# Patient Record
Sex: Female | Born: 1935 | Race: Black or African American | Hispanic: No | Marital: Married | Smoking: Never smoker
Health system: Southern US, Community
[De-identification: ages and names within clinical notes are randomized; demographics above are authoritative.]

## PROBLEM LIST (undated history)

## (undated) DIAGNOSIS — M1990 Unspecified osteoarthritis, unspecified site: Secondary | ICD-10-CM

## (undated) DIAGNOSIS — E785 Hyperlipidemia, unspecified: Secondary | ICD-10-CM

## (undated) DIAGNOSIS — L03116 Cellulitis of left lower limb: Secondary | ICD-10-CM

## (undated) DIAGNOSIS — L97909 Non-pressure chronic ulcer of unspecified part of unspecified lower leg with unspecified severity: Secondary | ICD-10-CM

## (undated) DIAGNOSIS — N3281 Overactive bladder: Secondary | ICD-10-CM

## (undated) DIAGNOSIS — E669 Obesity, unspecified: Secondary | ICD-10-CM

## (undated) DIAGNOSIS — C801 Malignant (primary) neoplasm, unspecified: Secondary | ICD-10-CM

## (undated) DIAGNOSIS — I10 Essential (primary) hypertension: Secondary | ICD-10-CM

## (undated) HISTORY — DX: Obesity, unspecified: E66.9

## (undated) HISTORY — DX: Hyperlipidemia, unspecified: E78.5

## (undated) HISTORY — DX: Essential (primary) hypertension: I10

## (undated) HISTORY — PX: TUBAL LIGATION: SHX77

## (undated) HISTORY — PX: JOINT REPLACEMENT: SHX530

## (undated) HISTORY — PX: KNEE SURGERY: SHX244

## (undated) HISTORY — DX: Unspecified osteoarthritis, unspecified site: M19.90

## (undated) HISTORY — PX: ROTATOR CUFF REPAIR: SHX139

## (undated) HISTORY — PX: EYE SURGERY: SHX253

## (undated) HISTORY — DX: Overactive bladder: N32.81

## (undated) HISTORY — DX: Malignant (primary) neoplasm, unspecified: C80.1

---

## 1998-10-04 ENCOUNTER — Ambulatory Visit (HOSPITAL_COMMUNITY): Admission: RE | Admit: 1998-10-04 | Discharge: 1998-10-04 | Payer: Self-pay | Admitting: Obstetrics and Gynecology

## 1998-12-11 ENCOUNTER — Other Ambulatory Visit: Admission: RE | Admit: 1998-12-11 | Discharge: 1998-12-11 | Payer: Self-pay | Admitting: Obstetrics and Gynecology

## 1999-10-25 ENCOUNTER — Encounter: Payer: Self-pay | Admitting: Orthopedic Surgery

## 1999-10-25 ENCOUNTER — Ambulatory Visit (HOSPITAL_COMMUNITY): Admission: RE | Admit: 1999-10-25 | Discharge: 1999-10-25 | Payer: Self-pay | Admitting: Orthopedic Surgery

## 1999-12-04 ENCOUNTER — Encounter: Payer: Self-pay | Admitting: Orthopedic Surgery

## 1999-12-05 ENCOUNTER — Ambulatory Visit (HOSPITAL_COMMUNITY): Admission: RE | Admit: 1999-12-05 | Discharge: 1999-12-06 | Payer: Self-pay | Admitting: Orthopedic Surgery

## 2000-01-20 ENCOUNTER — Ambulatory Visit (HOSPITAL_COMMUNITY): Admission: RE | Admit: 2000-01-20 | Discharge: 2000-01-20 | Payer: Self-pay | Admitting: Emergency Medicine

## 2000-01-22 ENCOUNTER — Encounter: Admission: RE | Admit: 2000-01-22 | Discharge: 2000-02-24 | Payer: Self-pay | Admitting: Orthopedic Surgery

## 2000-10-20 ENCOUNTER — Ambulatory Visit (HOSPITAL_COMMUNITY): Admission: RE | Admit: 2000-10-20 | Discharge: 2000-10-20 | Payer: Self-pay | Admitting: Gastroenterology

## 2001-01-13 ENCOUNTER — Encounter: Payer: Self-pay | Admitting: Obstetrics and Gynecology

## 2001-01-13 ENCOUNTER — Ambulatory Visit (HOSPITAL_COMMUNITY): Admission: RE | Admit: 2001-01-13 | Discharge: 2001-01-13 | Payer: Self-pay | Admitting: Obstetrics and Gynecology

## 2001-05-13 ENCOUNTER — Other Ambulatory Visit: Admission: RE | Admit: 2001-05-13 | Discharge: 2001-05-13 | Payer: Self-pay | Admitting: Obstetrics and Gynecology

## 2002-10-04 ENCOUNTER — Encounter: Payer: Self-pay | Admitting: Obstetrics and Gynecology

## 2002-10-04 ENCOUNTER — Ambulatory Visit (HOSPITAL_COMMUNITY): Admission: RE | Admit: 2002-10-04 | Discharge: 2002-10-04 | Payer: Self-pay | Admitting: Obstetrics and Gynecology

## 2003-05-23 ENCOUNTER — Other Ambulatory Visit: Admission: RE | Admit: 2003-05-23 | Discharge: 2003-05-23 | Payer: Self-pay | Admitting: Obstetrics and Gynecology

## 2003-10-11 ENCOUNTER — Ambulatory Visit (HOSPITAL_COMMUNITY): Admission: RE | Admit: 2003-10-11 | Discharge: 2003-10-11 | Payer: Self-pay | Admitting: Obstetrics and Gynecology

## 2003-10-11 ENCOUNTER — Encounter: Payer: Self-pay | Admitting: Obstetrics and Gynecology

## 2004-01-11 ENCOUNTER — Encounter: Admission: RE | Admit: 2004-01-11 | Discharge: 2004-04-10 | Payer: Self-pay | Admitting: Family Medicine

## 2004-02-15 ENCOUNTER — Ambulatory Visit (HOSPITAL_COMMUNITY): Admission: RE | Admit: 2004-02-15 | Discharge: 2004-02-15 | Payer: Self-pay | Admitting: Ophthalmology

## 2004-07-31 ENCOUNTER — Other Ambulatory Visit: Admission: RE | Admit: 2004-07-31 | Discharge: 2004-07-31 | Payer: Self-pay | Admitting: Obstetrics and Gynecology

## 2004-10-21 ENCOUNTER — Ambulatory Visit (HOSPITAL_COMMUNITY): Admission: RE | Admit: 2004-10-21 | Discharge: 2004-10-21 | Payer: Self-pay | Admitting: Obstetrics and Gynecology

## 2005-07-31 ENCOUNTER — Other Ambulatory Visit: Admission: RE | Admit: 2005-07-31 | Discharge: 2005-07-31 | Payer: Self-pay | Admitting: Obstetrics and Gynecology

## 2005-10-23 ENCOUNTER — Ambulatory Visit (HOSPITAL_COMMUNITY): Admission: RE | Admit: 2005-10-23 | Discharge: 2005-10-23 | Payer: Self-pay | Admitting: Obstetrics and Gynecology

## 2006-09-29 ENCOUNTER — Encounter: Admission: RE | Admit: 2006-09-29 | Discharge: 2006-09-29 | Payer: Self-pay | Admitting: Orthopedic Surgery

## 2006-10-14 ENCOUNTER — Inpatient Hospital Stay (HOSPITAL_COMMUNITY): Admission: RE | Admit: 2006-10-14 | Discharge: 2006-10-19 | Payer: Self-pay | Admitting: Orthopedic Surgery

## 2006-10-15 ENCOUNTER — Ambulatory Visit: Payer: Self-pay | Admitting: Physical Medicine & Rehabilitation

## 2006-11-18 ENCOUNTER — Encounter: Admission: RE | Admit: 2006-11-18 | Discharge: 2007-01-27 | Payer: Self-pay | Admitting: Orthopedic Surgery

## 2007-07-22 ENCOUNTER — Ambulatory Visit (HOSPITAL_COMMUNITY): Admission: RE | Admit: 2007-07-22 | Discharge: 2007-07-22 | Payer: Self-pay | Admitting: Family Medicine

## 2008-02-16 ENCOUNTER — Encounter (INDEPENDENT_AMBULATORY_CARE_PROVIDER_SITE_OTHER): Payer: Self-pay | Admitting: Obstetrics and Gynecology

## 2008-02-16 ENCOUNTER — Ambulatory Visit (HOSPITAL_COMMUNITY): Admission: RE | Admit: 2008-02-16 | Discharge: 2008-02-16 | Payer: Self-pay | Admitting: Obstetrics and Gynecology

## 2008-02-27 HISTORY — PX: ABDOMINAL HYSTERECTOMY: SHX81

## 2008-03-15 ENCOUNTER — Ambulatory Visit: Admission: RE | Admit: 2008-03-15 | Discharge: 2008-03-15 | Payer: Self-pay | Admitting: Gynecologic Oncology

## 2008-04-19 ENCOUNTER — Ambulatory Visit: Admission: RE | Admit: 2008-04-19 | Discharge: 2008-04-19 | Payer: Self-pay | Admitting: Gynecologic Oncology

## 2008-06-21 ENCOUNTER — Emergency Department (HOSPITAL_COMMUNITY): Admission: EM | Admit: 2008-06-21 | Discharge: 2008-06-21 | Payer: Self-pay | Admitting: Emergency Medicine

## 2008-06-23 ENCOUNTER — Encounter: Admission: RE | Admit: 2008-06-23 | Discharge: 2008-06-23 | Payer: Self-pay | Admitting: Family Medicine

## 2008-08-01 ENCOUNTER — Encounter: Payer: Self-pay | Admitting: Gynecologic Oncology

## 2008-08-01 ENCOUNTER — Other Ambulatory Visit: Admission: RE | Admit: 2008-08-01 | Discharge: 2008-08-01 | Payer: Self-pay | Admitting: Gynecologic Oncology

## 2008-08-01 ENCOUNTER — Ambulatory Visit: Admission: RE | Admit: 2008-08-01 | Discharge: 2008-08-01 | Payer: Self-pay | Admitting: Gynecologic Oncology

## 2008-08-09 ENCOUNTER — Ambulatory Visit: Payer: Self-pay | Admitting: Vascular Surgery

## 2008-09-29 ENCOUNTER — Ambulatory Visit (HOSPITAL_COMMUNITY): Admission: RE | Admit: 2008-09-29 | Discharge: 2008-09-29 | Payer: Self-pay | Admitting: Family Medicine

## 2009-04-02 ENCOUNTER — Encounter: Admission: RE | Admit: 2009-04-02 | Discharge: 2009-05-01 | Payer: Self-pay | Admitting: Orthopedic Surgery

## 2009-04-25 ENCOUNTER — Encounter: Payer: Self-pay | Admitting: Gynecologic Oncology

## 2009-04-25 ENCOUNTER — Ambulatory Visit: Admission: RE | Admit: 2009-04-25 | Discharge: 2009-04-25 | Payer: Self-pay | Admitting: Gynecologic Oncology

## 2009-04-25 ENCOUNTER — Other Ambulatory Visit: Admission: RE | Admit: 2009-04-25 | Discharge: 2009-04-25 | Payer: Self-pay | Admitting: Gynecologic Oncology

## 2009-12-26 ENCOUNTER — Ambulatory Visit: Admission: RE | Admit: 2009-12-26 | Discharge: 2009-12-26 | Payer: Self-pay | Admitting: Gynecologic Oncology

## 2009-12-26 ENCOUNTER — Other Ambulatory Visit: Admission: RE | Admit: 2009-12-26 | Discharge: 2009-12-26 | Payer: Self-pay | Admitting: Gynecologic Oncology

## 2010-02-08 ENCOUNTER — Ambulatory Visit (HOSPITAL_COMMUNITY): Admission: RE | Admit: 2010-02-08 | Discharge: 2010-02-08 | Payer: Self-pay | Admitting: Family Medicine

## 2010-04-27 ENCOUNTER — Emergency Department (HOSPITAL_COMMUNITY): Admission: EM | Admit: 2010-04-27 | Discharge: 2010-04-27 | Payer: Self-pay | Admitting: Family Medicine

## 2010-12-26 ENCOUNTER — Ambulatory Visit
Admission: RE | Admit: 2010-12-26 | Discharge: 2010-12-26 | Payer: Self-pay | Source: Home / Self Care | Attending: Gynecologic Oncology | Admitting: Gynecologic Oncology

## 2011-03-07 ENCOUNTER — Other Ambulatory Visit (HOSPITAL_COMMUNITY): Payer: Self-pay | Admitting: Family Medicine

## 2011-03-07 DIAGNOSIS — Z1231 Encounter for screening mammogram for malignant neoplasm of breast: Secondary | ICD-10-CM

## 2011-03-20 ENCOUNTER — Ambulatory Visit (HOSPITAL_COMMUNITY)
Admission: RE | Admit: 2011-03-20 | Discharge: 2011-03-20 | Disposition: A | Discharge status: Home / Self Care | Payer: Medicare Other | Source: Ambulatory Visit | Attending: Family Medicine | Admitting: Family Medicine

## 2011-03-20 DIAGNOSIS — Z1231 Encounter for screening mammogram for malignant neoplasm of breast: Secondary | ICD-10-CM

## 2011-05-13 NOTE — Consult Note (Signed)
NAME:  Jenna [name], Jenna [name] NO.:  [id number]   MEDICAL RECORD NO.:  [id number]          PATIENT TYPE:  OUT   LOCATION:  GYN                          FACILITY:  [facility]   PHYSICIAN:  Paola A. Duard Brady, MD    DATE OF BIRTH:  01/27/1931   DATE OF CONSULTATION:  08/01/2008  DATE OF DISCHARGE:                                 CONSULTATION   The patient is a 77 year old with a stage IB grade 1 endometrioid  adenocarcinoma.  She underwent robotic hysterectomy, BSO, bilateral  pelvic lymph node dissection at [facility] March 24, 2008.  There was only 16%  myometrial invasion, no lymphovascular space involvement, negative  washings, negative tubes and ovaries, and 0/17 lymph nodes were  involved.  She was dispositioned to close followup.  She was seen for  postoperative check April 19, 2008, and comes in today for her first  interval exam.  She does have some what she described as some occasional  naggy pains which she has noticed.  Nothing appears to exacerbate them,  potentially movement.  They only last for a few seconds, and they occur  most often in the left upper quadrant.  She states she has spoken with  her son-in-law who is an OB/GYN and states that these are normal pains,  and that she believes that they most likely are   REVIEW OF SYSTEMS:  She denies any chest pain, short of breath, nausea,  vomiting, fevers, chills, vaginal bleeding, change in had bowel or  bladder habits, unintentional weight loss, or weight gain.  There have  been no interval medical changes.   INTERVAL MEDICAL HISTORY:  She has been diagnosed with a brain aneurysm  after suffering from a fairly acute headache.  It is being monitored at  this time, with no acute intervention required.   PHYSICAL EXAMINATION:  VITAL SIGNS:  Weight 246 pounds, which is stable.  Blood pressure 128/69.  GENERAL:  Well-nourished, well-developed female, in no acute distress.  NECK:  Supple.  There is no lymphadenopathy, no  thyromegaly.  LUNGS:  Clear to auscultation bilaterally.  CARDIOVASCULAR:  Regular rate and rhythm.  ABDOMEN:  Morbidly obese.  She has well-healed surgical incisions.  She  has about a 0.5 seborrheic keratosis on the left upper abdomen.  Groins  are negative for adenopathy, but exam is somewhat limited by habitus.  EXTREMITIES:  She has well-healed surgical incisions on her left knee.  She has 1-2+ pitting edema, equal bilaterally.  PELVIC:  External genitalia is within normal limits, though atrophic.  The vagina is atrophic.  The vaginal cuff is visualized.  There are no  gross visible lesions.  ThinPrep Pap was submitted without difficulty.  Bimanual examination again is somewhat limited by habitus.  There are no  palpable masses or nodularity.  Rectal confirms no masses.  There is a  large amount of stool in the vault.   ASSESSMENT:  A 77 year old with stage I B grade 1 endometrioid  adenocarcinoma who is doing well and has no evidence of recurrent  disease.   PLAN:  Will follow the results for Pap  smear from today.  She will  follow up with Dr. Estanislado Pandy in 4 months and return to see [place] in 8 months.  Will continue alternating every 4 months for the first year, and then  she will go to q.6 month visits.  She was given a reminder card to  contact us after she sees Dr. Estanislado Pandy for an appointment in April 2010.  She will follow up with her other physicians as scheduled.      Paola A. Duard Brady, MD  Electronically Signed     PAG/MEDQ  D:  08/01/2008  T:  08/01/2008  Job:  [id number]   cc:   Dois Davenport A. Rivard, M.D.  Fax: [phone]   Telford Nab, R.N.  [address]  [place], [place] [address]   Jethro Bastos, M.D.  Fax: [phone]

## 2011-05-13 NOTE — Op Note (Signed)
NAME:  Jenna [name], SCHMID NO.:  [id number]   MEDICAL RECORD NO.:  [id number]          PATIENT TYPE:  AMB   LOCATION:  SDC                           FACILITY:  [facility]   PHYSICIAN:  Dois Davenport A. Rivard, M.D. DATE OF BIRTH:  07-12-1931   DATE OF PROCEDURE:  02/16/2008  DATE OF DISCHARGE:                               OPERATIVE REPORT   PREOPERATIVE DIAGNOSIS:  Post menopausal bleeding and endometrial polyp.   POSTOPERATIVE DIAGNOSIS:  Post menopausal bleeding and endometrial  polyp.   ANESTHESIA:  General.   PROCEDURE:  Hysteroscopy, resection of endometrial polyp, and fractional  dilatation and curettage.   SURGEON:  Crist Fat. Rivard, M.D.   ASSISTANT:  None.   ESTIMATED BLOOD LOSS:  Minimal.   PROCEDURE IN DETAIL:  After being informed of the planned procedure with  possible complications including bleeding, infection, injury to uterus  and intra-abdominal organs, informed consent was obtained and the  patient was taken to OR #4, given general anesthesia with laryngeal  mask, and placed in the lithotomy position.  Please note that in the  preoperative preparation, the patient was started on an Ancef 2 gram  infusion and rapidly after starting the infusion she started feeling  sick, vomited, and even had some respiratory issues with wheezing. The  patient responded to cortisone and broncodilator.  She is prepped and  draped in a sterile fashion.  Her bladder is emptied with an in-and-out  red rubber catheter.  Pelvic exam reveals an anteverted uterus, small,  and adnexa are not felt.   A weighted speculum is inserted in the vagina and the cervix was grasped  with a tenaculum forceps.  We proceed with a paracervical block using  Nesacaine 1%, 16 mL in the usual fashion.  ECC was performed easily and  the uterus was sounded at 8.5 cm.  The cervix is easily dilated using  Hegar dilator until #31 which allows easy entry of a diagnostic scope  with a perfusion of  sorbitol at 100 mmHg maximum pressure.  We  visualized the entire uterine cavity which reveals, around the left  tubal ostia, multiple small polypoid formation with evident atypical  blood vessels suspicious for either hyperplassia or adenocarcinoma.  Otherwise, the endometrium appears atrophic.  The diagnostic  hysteroscope is removed for the operative hysteroscope and using a  single loop resectoscope, we resect this area and the left cornual area.  This was sent separately and is labeled endometrial polyp.  After  resection is completed, the hysteroscope is removed and a sharp curette  is used to do a general, endometrial curettage.   The instruments are then removed.  Instruments and sponge count is  complete x2.  Estimated blood loss is minimal.  Water deficit is 55 mL.  Specimens are endocervical curettage, endometrial curettings and  endometrial polyps were sent separately to pathology.  The procedure is  very well tolerated by the patient who is taken to recovery room in a  well and stable condition.      Crist Fat Rivard, M.D.  Electronically Signed     SAR/MEDQ  D:  02/16/2008  T:  02/16/2008  Job:  [id number]

## 2011-05-13 NOTE — Consult Note (Signed)
NAME:  Jenna [name], Jenna [name] NO.:  [id number]   MEDICAL RECORD NO.:  [id number]          PATIENT TYPE:  OUT   LOCATION:  GYN                          FACILITY:  [facility]   PHYSICIAN:  Paola A. Duard Brady, MD    DATE OF BIRTH:  20-Oct-1931   DATE OF CONSULTATION:  DATE OF DISCHARGE:                                 CONSULTATION   The patient is seen today in consultation at the request of Dr. Estanislado Pandy.  Ms. Mazo is a 77 year old gravida 7, para 7,-0-0-6 who went in for  her annual visit.  At that time, she complained to Dr. Estanislado Pandy of  bleeding for about 1 to 2 weeks.  She had never had any postmenopausal  bleeding up to this point.  She was menopausal starting in her late 35s  or 70s, was on hormone replacement therapy for about a 2-3 years after  menopause, but subsequently stopped it.  As stated above, she had not  had any bleeding should until prior to her annual examination.  With the  bleeding, there was no associated pain or discomfort, or abdominal  cramping.   REVIEW OF SYSTEMS:  She denies any chest pain, short of breath, nausea,  vomiting, fevers, chills, unintentional weight loss, weight gain,  headaches, visual changes.  10-point review of systems is otherwise  negative in addition.  She did have an endometrial biopsy performed by  Dr. Estanislado Pandy that revealed complex hyperplasia with atypia, could not rule  out a grade 1 endometrial carcinoma.  Ultrasound performed on January 22  revealed a 7 x 4 x 3 cm uterus.  The ovaries bilaterally looked  unremarkable.  She did have a 0.9 cm hyperechoic mass.   PAST MEDICAL HISTORY:  Hypertension and diabetes.   MEDICATIONS:  1. Glipizide 2.5 mg p.o. daily.  2. Metformin ER 500 mg #4 at bedtime.  3. Lodine XL 1 p.o. twice daily.  4. Benicar hydrochlorothiazide 40/25 mg 1 p.o. daily.  5. Norvasc 10 mg p.o. daily.  6. Lipitor 20 mg p.o. daily.  7. Baby aspirin daily.  8. Stool softener daily.   PAST SURGICAL HISTORY:  Left  knee replacement in October 2007, bilateral  tubal ligation, rotator cuff surgery on the right, she had NSVD x 7.   ALLERGIES:  None.  She states she became nauseated with ANCEF.   SOCIAL HISTORY:  She denies use tobacco or alcohol.  She is retired and  married.   FAMILY HISTORY:  Her sister had a oral cancer.  She smoked tobacco and  drank alcohol.  She had a brother with colon cancer.  She has another  brother with questionable lung cancer.  She has one 67 year old daughter  with a positive APC mutation who is scheduled for total colectomy at  [facility] on April 9.   HEALTH MAINTENANCE:  She is up-to-date on her mammograms having had one  in August 2008.  She had a colonoscopy 9 years ago.  She states she is  due.   PHYSICAL EXAMINATION:  Weight 244 pounds, height 5 feet 1 inches.  Blood  pressure 152/72, pulse  74.  Well-nourished, well-developed female in no acute distress.  NECK:  Supple with no lymphadenopathy, no thyromegaly.  Lungs clear to auscultation bilaterally.  CARDIOVASCULAR:  Regular rate and rhythm.  ABDOMEN:  Morbidly obese.  She has a well-healed infraumbilical vertical  skin incision measuring approximately 4 cm.  Abdomen is soft, nontender,  nondistended.  No palpable masses or cardiomegaly though exam is limited  by habitus.  Groins are negative for adenopathy.  EXTREMITIES:  She has 2+ lower extremity pitting edema.  Left greater  than right with some chronic venous stasis changes.  She has a well-  healed vertical skin incision overlying her left knee.  PELVIC:  External genitalia is within normal limits.  The vagina is  somewhat atrophic.  The cervix is visualized.  It is clean.  There is no  visible lesions or discharge.  Bimanual examination, the cervix is  palpably normal.  The corpus does not appear appreciably enlarged.  It  is difficult to ascertain the size due to her habitus.  There are no  adnexal masses.  Rectal confirms.  There is no  nodularity.   ASSESSMENT:  64. A 77 year old with complex hyperplasia with atypia who may have a      focus of grade 1 endometrial carcinoma.  This was diagnosed as the      patient complains of some spotting to her primary care physician.      She had an appropriate workup with ultrasound as well as      hysteroscopy D&C.  The endometrial polyp again showed complex      atypical hyperplasia as did the endometrial curettings.  She is      here with her daughter who was a Engineer, civil (consulting) at [facility] as well as her husband to discuss the results.  We discussed      surgical removal as the definitive therapy in a postmenopausal      woman this would consist of hysterectomy with removal of the      uterus, cervix, bilateral tubes, and ovaries.  The uterus to be      sent for frozen section.  If there is no significant invasive      disease, the procedure would be concluded.  If there is any      evidence of more severe grade or depth of invasion, she would need      to proceed with lymphadenectomy at that setting.  I am concerned      regarding minimally invasive routes due to her short stature and      central obesity, and the need for abdominal insufflation in      Trendelenburg.  I do think it is worthwhile trying to proceed in a      minimally invasive approach to save her the postoperative morbidity      with large vertical midline incision.  She and her daughter would      agree with this.  They do asked whether not this would be something      that could be done robotically.  At this point, were not offering      robotic gynecologic oncology services here in [place].  We are      at [facility].  They appear to be very comfortable with [facility] as her other daughter is having surgery there.  They would      like to be done  later in April so that she would be available to      help her daughter recover from her total colectomy.  I think this      would be  reasonable.  I discussed with the patient.  I will contact      her and schedule it appropriately at [facility].  Her best contact      numbers are home number [phone], which is a home number.  Her      cell phone at [phone].  Risks, benefits of surgery and      benefits to minimally invasive approaches were discussed with the      patient and her family.  They seem to understand this.  2. She will need to stop her aspirin 1 week prior to surgery, but this      will be determined once her surgery is scheduled.      Paola A. Duard Brady, MD  Electronically Signed     PAG/MEDQ  D:  03/15/2008  T:  03/15/2008  Job:  [id number]

## 2011-05-13 NOTE — Consult Note (Signed)
NAME:  Jenna [name], Jenna [name] NO.:  [id number]   MEDICAL RECORD NO.:  [id number]          PATIENT TYPE:  OUT   LOCATION:  GYN                          FACILITY:  [facility]   PHYSICIAN:  Paola A. Duard Brady, MD    DATE OF BIRTH:  12-15-1931   DATE OF CONSULTATION:  04/25/2009  DATE OF DISCHARGE:                                 CONSULTATION   The patient is a 77 year old with stage IB grade 1 endometrioid  carcinoma.  She underwent robotic hysterectomy, BSO, and bilateral  pelvic lymph node dissection at [facility] March 24, 2008.  There is only 16%  myometrial invasion.  No lymphovascular space involvement.  Negative  washings.  Negative adnexa and 0/17 lymph nodes were negative.  She was  last seen by [place] in August 2009 at which time her exam and Pap smear were  unremarkable.  She was seen by Dr. Estanislado Pandy in December 2009 at which time  her exam and Pap smear were also negative.  She comes in today for  followup.  She is overall doing quite well.  She does occasionally have  some constipation, which she states it is worse if she has been taking  pain meds, including Percocet for a pinched nerve in her neck.  That  pinched nerve is now better with physical therapy.  She is not taking  the pain medications and her constipation is improved.  She was also on  steroids for a period of time.  She otherwise denies any complaints.   REVIEW OF SYSTEMS:  She has had some constipation as stated above.  She  denies any nausea, vomiting, chest pain shortness of breath, denies any  headaches as she did before when she was diagnosed with her brain  aneurysm.  She has any change in her bladder habits.  She denies any  vaginal bleeding.   MEDICATIONS:  Medication list is reviewed is unchanged.   FAMILY HISTORY:  There is no new medical problem.   HEALTH MAINTENANCE:  She is up-to-date on her mammograms.   PHYSICAL EXAMINATION:  VITAL SIGNS:  Weight 244 pounds.  CONSTITUTIONAL:  Well-nourished,  well-developed female in no acute  distress.  NECK:  Supple with no lymphadenopathy, no thyromegaly.  LUNGS:  Clear to auscultation bilaterally.  CARDIOVASCULAR:  Regular rate and rhythm.  ABDOMEN:  Morbidly obese.  She has well-healed surgical incisions.  There is no evidence of incisional hernias.  Groins are negative for  adenopathy.  Abdomen is obese, soft, nontender, nondistended.  No  palpable mass.  However exam is limited by habitus.  EXTREMITIES:  There is no edema.  PELVIC:  External genitalia is within normal limits.  Vagina is  atrophic.  The vaginal cuff is visualized.  No visible lesions.  ThinPrep Pap was submitted without difficulty.  Bimanual examination is  limited by habitus.  However, there are no masses or nodularity  appreciated.  Rectovaginal examination reveals a large amount of stool  within the colon.  There are no masses.   ASSESSMENT:  A 77 year old with stage IB grade 1 endometrioid  adenocarcinoma who was 1 year  out, has no evidence recurrent disease.   PLAN:  Will follow up results from her Pap smear from today.  She will  return to see Dr. Estanislado Pandy in 4 months and return to see [place] in 8 months.  Once she gets to the spring of 2011, she can return to q.6 month visits.      Paola A. Duard Brady, MD  Electronically Signed     PAG/MEDQ  D:  04/25/2009  T:  04/25/2009  Job:  [id number]   cc:   Dois Davenport A. Rivard, M.D.  Fax: [phone]   Telford Nab, R.N.  [address]  [place], [place] [address]   Jethro Bastos, M.D.  Fax: [phone]

## 2011-05-13 NOTE — Consult Note (Signed)
NAME:  Jenna [name], Jenna [name] NO.:  [id number]   MEDICAL RECORD NO.:  [id number]          PATIENT TYPE:  OUT   LOCATION:  GYN                          FACILITY:  [facility]   PHYSICIAN:  Paola A. Duard Brady, MD    DATE OF BIRTH:  1931/01/21   DATE OF CONSULTATION:  04/19/2008  DATE OF DISCHARGE:                                 CONSULTATION   Jenna [name] is a very pleasant 77 year old gravida 7, para 7-0-0-6, who  in for her annual examination.  At that time she complained to Dr.  Estanislado Pandy of a 1-2-week history of postmenopausal bleeding.  Endometrial  biopsy was performed that revealed complex at least complex hyperplasia  with atypia, could not rule endometrial cancer.  We initially saw the  patient on March 15, 2008.  Lengthy discussion was had regarding  treatment options and they opted for surgery.  She subsequently  underwent a total robotic hysterectomy, BSO and bilateral pelvic lymph  node dissection at [facility] on March 24, 2008.  She did very well from a  postoperative standpoint.  Final pathology was a focal grade I  endometrioid adenocarcinoma arising within complex hyperplasia with  atypia.  There was 16% myometrial invasion (0.3/1.9 cm).  Negative lymph  nodes were 0/17.  There is no lymphovascular space involvement negative  tubes and ovaries, negative washing.  She was dispositioned to close  follow-up.  She comes in today for a postoperative check.  She is  overall doing quite well.  She occasionally will have what she describes  as twinges.  She really feels that she is about 100%.  Her daughter is  having surgery at [facility] today.   PHYSICAL EXAMINATION:  VITAL SIGNS:  Weight 246 pounds which is up 2  pounds from her last visit.  GENERAL APPEARANCE:  Well-nourished, well-developed female in no acute  distress.  ABDOMEN:  She has well-healed surgical incisions.  Abdomen is soft and  nontender.  PELVIC:  External genitalia is within normal limits.  Vagina is  atrophic.  The vaginal cuff is visualized.  It is healing well.  There  is a large amount of stool in the vault on bimanual examination.  There  is no vaginal cuff masses, nodularity or tenderness.   ASSESSMENT:  A 77 year old a stage IB grade I endometrioid  adenocarcinoma.  Discussed with her that she will be followed  alternating between myself and Dr. Estanislado Pandy every 4 months for the first  year.  She will then go to every  28-month visits for a total of 5 years' of follow-up.  She will return to  see me in August and then will begin alternating visits with Dr. Estanislado Pandy.  Her questions were elicited and answered to her satisfaction.  She is  very pleased with her surgical pathology report.      Paola A. Duard Brady, MD  Electronically Signed     PAG/MEDQ  D:  04/19/2008  T:  04/19/2008  Job:  [id number]   cc:   Dois Davenport A. Rivard, M.D.  Fax: [phone]   Telford Nab, R.N.  [address] [facility]  [place], [place] [address]   Jethro Bastos, M.D.  Fax: [phone]

## 2011-05-16 NOTE — Discharge Summary (Signed)
NAME:  Jenna [name], CRANCE NO.:  [id number]   MEDICAL RECORD NO.:  [id number]          PATIENT TYPE:  INP   LOCATION:  5021                         FACILITY:  [facility]   PHYSICIAN:  Myrtie Neither, MD      DATE OF BIRTH:  [birth year]   DATE OF ADMISSION:  10/14/2006  DATE OF DISCHARGE:  10/18/2006                                 DISCHARGE SUMMARY   ADMISSION DIAGNOSIS:  Degenerative arthritis, left knee.   DISCHARGE DIAGNOSIS:  Degenerative arthritis, left knee.   COMPLICATIONS:  None.   INFECTIONS:  None.   OPERATIONS:  Left total knee arthroplasty.   HISTORY:  This is a 77 year old female who has been treated for degenerative  joint disease involving her left knee which progressively worsened over the  past several months. The patient had been treated with therapeutic  injections and anti-inflammatories and use of a cane.   PHYSICAL EXAMINATION:  LEFT LOWER EXTREMITY:  Varus deformity.  Crepitus.  Effusion to tendon medial and laterally.  Range of motion is limited.   HOSPITAL COURSE:  The patient had preop laboratory, CBC, EKG, chest x-ray,  PT, PTT, UA, platelet count done.  The patient's laboratory tests were  stable enough to undergo surgery. The patient underwent left total knee  arthroplasty.  Tolerated the procedure quite well. The patient progressed  with therapy fairly slowly.  The patient has been using CPM, weight bearing  50% on the left knee.  The patient is presently stable enough to be  discharged to nursing home and will continue with use of CPM and physical  therapy.  Weight bearing 50%.  Daily dressing changes.   MEDICATIONS:  1. Glucotrol-XL 2.5 mg daily a.c.  2. Avapro 300 mg daily.  3. Hydrochlorothiazide 25 mg daily.  4. Glucophage-XR 500 mg q.h.s.  5. Zocor 20 mg daily.  6. Norvasc 10 mg daily.  7. Coumadin presently patient is taking 2.5 mg daily.  8. Colace 100 mg b.i.d.  9. Ferrous sulfate 325 mg t.i.d.  10.Sliding scale insulin.  11.Darvocet N, 1-2 q. 4 hours p.r.n. for pain.  12.Tylenol q. 4 hours p.r.n.  13.Ambien 10 mg h.s. p.r.n.   PLAN:  The patient will be seen back in the office in ten day period. The  patient is being discharged in stable and satisfactory condition.      Myrtie Neither, MD  Electronically Signed     AC/MEDQ  D:  10/18/2006  T:  10/18/2006  Job:  [id number]

## 2011-05-16 NOTE — Op Note (Signed)
. [facility]  Patient:    Jenna [name]                      MRN: [id number] Proc. Date: 12/05/99 Adm. Date:  [id number] Disc. Date: [id number] Attending:  Wende Mott                           Operative Report  PREOPERATIVE DIAGNOSES: 1. Impingement syndrome, right shoulder. 2. Rotator cuff tear, right shoulder.  POSTOPERATIVE DIAGNOSES: 1. Impingement syndrome, right shoulder. 2. Rotator cuff tear, right shoulder.  PROCEDURES: 1. Arthroscopic acromioplasty and decompression. 2. Mini-arthrotomy with rotator cuff repair.  SURGEON:  Kennieth Rad, M.D.  ANESTHESIA:  General.  DESCRIPTION OF PROCEDURE:  The patient was taken to the operating room and given adequate preoperative medication and given general anesthesia and intubated. She was placed in sitting position and the right shoulder was prepped with DuraPrep and draped in a sterile manner.  One-half inch puncture wound was made along the posterior aspect of the left shoulder.  A ___ rod was placed from posterior to anterior and the anterior incision was made. In flow of water was anterior.  An  arthroscope was placed posteriorly and laterally.  Inspection of the joint revealed tremendously hypertrophic synovial sac with a split tear and a rotator cuff with fibrillation.  Chondromalacia changes underneath the subacromial surface and along the edge of the acromion.  The synovial sac and bursa sac was resected. Debridement underneath the surface of the acromion was done, as well as acromioplasty with the use of a bur.  Decompression of the joint was done and resection of the coracoacromial ligament.  Next a mini incision was made laterally going through the skin and deltoid and the tear in the rotator cuff was identified and with the use of 0 ___ sutures approximation was done.  Copious irrigation was done with antibiotic solution followed by wound closure with the  use of 0 Vicryl for the fascia and muscle and 2-0 for the subcutaneous and skin staples on the skin.  Compressive dressing was applied.  The patient was placed in an immobilizing shoulder brace.  The patient tolerated the procedure quite well and went to the  recovery room in stable and satisfactory condition.  The patient was kept 23 hours and was able to be discharged on Celebrex 200 mg daily.  Ice packs to the left shoulder.  Percocet tab one q.4h. p.r.n. pain and to return to the office in one week.  The patient was discharged in stable and satisfactory condition. DD:  02/20/00 TD:  02/20/00 Job: [id number]

## 2011-05-16 NOTE — Procedures (Signed)
[facility]  Patient:    Jenna [name], Jenna [name]                     MRN: [id number] Proc. Date: 10/20/00 Adm. Date:  [id number] Attending:  Nelda Marseille                           Procedure Report  PROCEDURE:  Colonoscopy.  INDICATIONS:  Bright red blood per rectum, increasing constipation.  Patient due for colonic screening.  Consent was signed after risks, benefits, methods, and options thoroughly discussed in the office on multiple occasions.  MEDICATIONS:  Demerol 50 mg, Versed 5 mg.  DESCRIPTION OF PROCEDURE:  Rectal inspection was pertinent for external hemorrhoids.  Digital exam was negative except for tender hemorrhoids.  The scope was inserted, easily advanced around the colon to the cecum.  This did not require any abdominal pressure or any position changes.  The cecum was identified by the appendiceal orifice and the ileocecal valve.  The scope was slowly withdrawn.  Prep was adequate, although she did require lots of washing and suctioning to get some residual stool removed.  On slow withdrawal back to the rectum, no abnormalities were seen; specifically, no polyps, diverticula, or other abnormalities.  Once back in the rectum, the endoscope was retroflexed, pertinent for some internal hemorrhoids.  The scope was straightened, and anorectal pull-through confirmed the hemorrhoids.  The scope was reinserted a short way up the sigmoid, air was suctioned, and the scope removed.  The patient tolerated the procedure well.  There was no obvious immediate complication.  ENDOSCOPIC DIAGNOSIS: 1. Internal-external hemorrhoids. 2. Otherwise within normal limits to the cecum.  PLAN:  Follow up in six to eight weeks to recheck the symptoms, guaiacs, and make sure no further workup plans are needed.  Continue hemorrhoidal and constipation management as discussed in the office.  Re-screen in five to 10 years as long as doing well medically, and recheck  guaiacs, symptoms if needed in six to eight weeks or call me sooner p.r.n. DD:  10/20/00 TD:  10/20/00 Job: [id number]

## 2011-05-16 NOTE — Op Note (Signed)
NAME:  Jenna [name], BRAN NO.:  [id number]   MEDICAL RECORD NO.:  [id number]          PATIENT TYPE:  INP   LOCATION:  2899                         FACILITY:  [facility]   PHYSICIAN:  Myrtie Neither, MD      DATE OF BIRTH:  04/27/1931   DATE OF PROCEDURE:  10/14/2006  DATE OF DISCHARGE:                                 OPERATIVE REPORT   PREOPERATIVE DIAGNOSIS:  Degenerative arthritis, left knee.   POSTOPERATIVE DIAGNOSIS:  Degenerative arthritis, left knee.   PROCEDURE:  Left total knee arthroplasty, Biomet implant.   SURGEON:  Myrtie Neither, MD.   ANESTHESIA:  General.   DESCRIPTION OF PROCEDURE:  The patient was taken to the operating room,  after being given adequate preoperative medications, given general  anesthesia and was intubated.  The left knee was prepped with DuraPrep and  draped in a sterile manner.  The tourniquet and Bovie were used for  hemostasis.  An anterior midline incision was made down to the left knee,  extending from the tibial tuberosity to the quadriceps.  Sharp and blunt  dissection were made down to the medial and lateral aspects of the capsule.  A medial paramedian incision was made in the capsule, extending from the  tibial tuberosity up to the quadriceps.  The patella was reflected  laterally.  The knee was taken through a flexed position.  Osteophytes about  the patella, tibia and the femur were resected.  A soft tissue resection was  done.  Some medial soft tissue release was also done due to varus deformity.  After adequate soft tissue release, the tibial cutting jig was put in place,  with a 10-mm cut from the tibial surface.  Next, reaming down the femoral  canal was done, and the distal femoral cutting jig put in place.  Sizing on  the femur was found to be 6 mm.  A 60-mm femoral cutting jig was put in  place.  Anterior and posterior cuts and chamfering cuts were made.  The  trial component was put in place and found to be a very  good and snug fit.  Tibial sizing was then done and was found to be 67 mm.  An appropriate  cutting jig for the tibial surface was put in place, stabilized, and  appropriate cuts made.  With the tibial and the femoral components in place,  full extension, full flexion, good medial and lateral stability were  obtained with a 12-mm poly.  The patella was then sized for a 34-mm, medium  size.  The depth was 21 mm.  The appropriate cutting jig was put in place  and the patellar cut was made.  With all 3 trial components in place, the  knee was taken through full flexion, full extension, no lateral subluxation  of the patella, good medial and lateral stability.  Next, irrigation was  done.  Methylmethacrylate was mixed.  The patella and the tibial components  were cemented.  Excess methylmethacrylate was removed.  The femoral  component was press-fitted.  Again, after the cement had set, the range of  motion was  again tested and inspection was done.  No other loose fragments  were noted.  Further irrigation was done.  The final poly was the 12 mm.  It  was locked in, full extension, full flexion, good medial and lateral  stability without subluxation of the patella.  The tourniquet was let done  and hemostasis obtained.  Wound closure was then done with 0 Vicryl for the  fascia, 2-0 for the subcutaneous, and skin staples for  the skin.  A bulky compressive dressing was applied.  The patient tolerated  the procedure quite well.  The patient received 15 cc of 0.25% with  epinephrine of Marcaine to the knee.  A femoral block was also to be  attempted.  The patient tolerated the procedure well and went to the  recovery room in stable and satisfactory condition.      Myrtie Neither, MD  Electronically Signed     AC/MEDQ  D:  10/14/2006  T:  10/14/2006  Job:  [id number]

## 2011-05-16 NOTE — Op Note (Signed)
NAME:  Jenna [name], Jenna [name] NO.:  [id number]   MEDICAL RECORD NO.:  [id number]                   PATIENT TYPE:  OIB   LOCATION:  2899                                 FACILITY:  [facility]   PHYSICIAN:  Salley Scarlet., M.D.         DATE OF BIRTH:  06-20-1931   DATE OF PROCEDURE:  02/15/2004  DATE OF DISCHARGE:  02/15/2004                                 OPERATIVE REPORT   PREOPERATIVE DIAGNOSIS:  Immature cataract, left eye.   POSTOPERATIVE DIAGNOSIS:  Immature cataract, left eye.   OPERATION:  Kellman phacoemulsification of cataract left eye.   ANESTHESIA:  Local using Xylocaine 2% with Marcaine 0.75% and [place].   JUSTIFICATION OF PROCEDURE:  This is a 77 year old diabetic lady who  complains of blurring of vision with difficulty seeing and reading and  getting around.  She was evaluated and found to have a visual acuity best  corrected to 20/30 on the right, 20/200 on the left.  There were bilateral  immature cataracts, worse on the left than right.  Cataract extraction with  intraocular lens implantation is recommended.  She is admitted at this time  for the procedure.   PROCEDURE:  Under the influence of IV sedation, akinesia and retrobulbar  anesthesia was given.  The patient was prepped and draped in the usual  manner.  The lid speculum was inserted under the upper and lower lid of the  left eye and a 4-0 silk traction suture was passed through the belly of the  superior rectus muscle for retraction.  A fornix based conjunctival flap was  turned and hemostasis achieved using cautery.  An incision was made in the  sclera at the limbus.  This incision was dissected down to clear cornea  using a crescent blade.  A side port  incision was made at the 1:30 o'clock  position.  OcuCoat was injected through this side port incision.  The  anterior chamber was then entered through the corneoscleral tunnel incision  at the 11:30 o'clock position using a  2.7 mm keratome.  An anterior  capsulotomy was done using a bent 25 gauge needle.  The nucleus was  hydrodissected using Xylocaine.  The KPE handpiece was passed into the eye  and the nucleus was emulsified without difficulty.  The pupil came down  shortly after the procedure was begun and we decided to convert to an  extracapsular procedure.  Therefore, the corneoscleral wound was extended to  the left and then to the right placing a single 8-0 Vicryl suture across  each arm of the incision as it was made towards the left and towards the  right.  The nucleus was then manually expressed from the eye.  Vitreous  swallowed the nucleus from the eye.  I discovered there was a central tear  in the posterior capsule.  An anterior vitrectomy was begun using a  vitrectomy.  The residual cortical material was then aspirated.  The  anterior chamber was reformed and the pupil was constricted using Miochol.  A peripheral iridectomy was made.  An anterior chamber lens was seated  across the eye without difficulty.  The corneoscleral wound was closed using  a combination of interrupted sutures of 8-0 Vicryl and 10-0 nylon.  After  ascertaining that the wound was air tight and water tight, Atropine drops  and Maxitrol ointment were applied along with a patch and Fox shield.  This  was done after 1 mL of Celestone and 0.5 mL of gentamicin were injected  subconjuctivally.  The patient tolerated the procedure well and was  discharged to the post anesthesia recovery room in satisfactory condition.  She is instructed  to rest today, to take Vicodin every 4 hours as needed for pain, and to see  me in the office tomorrow for further evaluation.   DISCHARGE DIAGNOSIS:  Immature cataract, left eye.                                               Salley Scarlet., M.D.    TB/MEDQ  D:  02/15/2004  T:  02/16/2004  Job:  [id number]

## 2011-05-16 NOTE — H&P (Signed)
[facility]  Patient:    Jenna [name]                      MRN: [id number] Adm. Date:  [id number] Disc. Date: [id number] Attending:  Wende Mott                         History and Physical  CHIEF COMPLAINT:  Pain from right shoulder.  HISTORY OF PRESENT ILLNESS:  This is a 77 year old black female who had been followed in the office for an impingement and rotator cuff problem for the past  several months. The patient had been treated with anti-inflammatories, therapeutic injections, warm compresses, and exercises.  The patient has had progressive worsening with continuous pain in the right shoulder, difficulty reaching high enough to brush her teeth or come her hair.  The patient did have MRI done which demonstrated impingement syndrome with some rotator cuff disruption.  ALLERGIES:  None known.  MEDICATIONS:  Glucophage, Norvasc, hydrochlorothiazide, clonidine, and Celebrex.  HABITS:  None.  REVIEW OF SYSTEMS:  Basically that of history of present illness, otherwise, occasional dorsal and low back pain.  No cardiac or respiratory.  No urinary or  bowel symptoms.  FAMILY HISTORY:  Noncontributory.  PHYSICAL EXAMINATION:  VITAL SIGNS:  Temperature is 98.2, pulse 78, respirations 20.  Blood pressure 147/69.  Height 5 feet 1 inch, weight 242.  HEENT:  Normocephalic.  Conjunctivae and sclerae clear.  NECK:  Supple.  CHEST:  Clear.  CARDIOVASCULAR:  S1, S2 regular.  EXTREMITIES:  Right shoulder tender anteriorly and laterally with subacromial crepitus. Active abduction is up to 75 degrees of abduction.  Extension is up to 85.  Pain on rotation, as well as path of abduction above 90 degree position. ood grip and intrinsic function is intact.  PAST MEDICAL HISTORY:  The patients past medical history is that of diabetes mellitus, high blood pressure.  Arthroscopy right knee and bilateral tubal ligation 29 years  ago.  IMPRESSION:  Impingement syndrome, right shoulder with rotator cuff tear. DD:  02/20/00 TD:  02/20/00 Job: [id number]

## 2011-09-05 ENCOUNTER — Other Ambulatory Visit: Payer: Self-pay | Admitting: Family Medicine

## 2011-09-05 ENCOUNTER — Ambulatory Visit
Admission: RE | Admit: 2011-09-05 | Discharge: 2011-09-05 | Disposition: A | Discharge status: Home / Self Care | Payer: Medicare Other | Source: Ambulatory Visit | Attending: Family Medicine | Admitting: Family Medicine

## 2011-09-05 DIAGNOSIS — R52 Pain, unspecified: Secondary | ICD-10-CM

## 2011-09-19 LAB — BASIC METABOLIC PANEL
BUN: 11
CO2: 30
Chloride: 99
Creatinine, Ser: 0.78

## 2011-09-19 LAB — CBC
HCT: 33.4 — ABNORMAL LOW
Hemoglobin: 11.4 — ABNORMAL LOW
MCV: 84.7
RBC: 3.95
WBC: 5.9

## 2011-09-25 LAB — CBC
Hemoglobin: 11.9 — ABNORMAL LOW
MCHC: 33.8
RBC: 4.25

## 2011-09-25 LAB — BASIC METABOLIC PANEL
CO2: 29
Calcium: 9.2
GFR calc Af Amer: >60
GFR calc non Af Amer: >60
Sodium: 141

## 2011-09-25 LAB — URINALYSIS, ROUTINE W REFLEX MICROSCOPIC
Glucose, UA: NEGATIVE
Hgb urine dipstick: NEGATIVE
Ketones, ur: NEGATIVE
Protein, ur: NEGATIVE

## 2011-09-25 LAB — DIFFERENTIAL
Lymphocytes Relative: 30
Lymphs Abs: 1.9
Monocytes Absolute: 0.5
Monocytes Relative: 8
Neutro Abs: 3.4

## 2011-09-25 LAB — URINE MICROSCOPIC-ADD ON

## 2011-12-10 ENCOUNTER — Ambulatory Visit: Payer: Medicare Other | Attending: Gynecologic Oncology | Admitting: Gynecologic Oncology

## 2011-12-10 ENCOUNTER — Encounter: Payer: Self-pay | Admitting: Gynecologic Oncology

## 2011-12-10 VITALS — BP 140/66 | HR 78 | Temp 98.0°F | Resp 18 | Ht 61.0 in | Wt 245.9 lb

## 2011-12-10 DIAGNOSIS — Z90710 Acquired absence of both cervix and uterus: Secondary | ICD-10-CM | POA: Insufficient documentation

## 2011-12-10 DIAGNOSIS — Z79899 Other long term (current) drug therapy: Secondary | ICD-10-CM | POA: Insufficient documentation

## 2011-12-10 DIAGNOSIS — M129 Arthropathy, unspecified: Secondary | ICD-10-CM | POA: Insufficient documentation

## 2011-12-10 DIAGNOSIS — E119 Type 2 diabetes mellitus without complications: Secondary | ICD-10-CM | POA: Insufficient documentation

## 2011-12-10 DIAGNOSIS — Z9079 Acquired absence of other genital organ(s): Secondary | ICD-10-CM | POA: Insufficient documentation

## 2011-12-10 DIAGNOSIS — E785 Hyperlipidemia, unspecified: Secondary | ICD-10-CM | POA: Insufficient documentation

## 2011-12-10 DIAGNOSIS — I10 Essential (primary) hypertension: Secondary | ICD-10-CM | POA: Insufficient documentation

## 2011-12-10 DIAGNOSIS — C549 Malignant neoplasm of corpus uteri, unspecified: Secondary | ICD-10-CM | POA: Insufficient documentation

## 2011-12-10 NOTE — Progress Notes (Signed)
Consult Note: Gyn-Onc  [name] 77 y.o. female  CC:  Chief Complaint  Patient presents with  . Follow-up    Endo ca    HPI: This is a 77 year old with a history of stage IA grade 1 endometrioid adenocarcinoma. She underwent robotic hysterectomy bilateral salpingo-oophorectomy and bilateral pelvic lymph node dissection in March 2009. She comes in today for routine followup. I last saw her in December of 2011 at which time her exam was negative. She saw Dr. Estanislado Pandy 6 months ago at which time her exam, and Pap smear were normal. At that time the 2 of them discussed her urinary incontinence. It was a combination of both stress as well as urge incontinence. She will start on what sounds like Detrol for this and I did not help her symptoms.  It did however cause her to have the heartburn. Interval History:   Review of Systems She denies any chest pain shortness of breath nausea, vomiting, fevers, chills, and early satiety. She does complain again for stress urinary incontinence as well as urge incontinence. She denies any vaginal bleeding any significant change in her bowel or bladder habits. She states her sugars have been higher in her last A1c was "7 point something". She was encouraged to her primary physician to modify her diet and increase your exercise level.  10 point review of systems was otherwise negative. Current Meds:  Outpatient Encounter Prescriptions as of 12/10/2011  Medication Sig Dispense Refill  . amLODipine (NORVASC) 10 MG tablet Take 10 mg by mouth daily.        Marland Kitchen atorvastatin (LIPITOR) 20 MG tablet Take 20 mg by mouth daily.        . brimonidine-timolol (COMBIGAN) 0.2-0.5 % ophthalmic solution Place 1 drop into both eyes 1 day or 1 dose.        Marland Kitchen glipiZIDE (GLUCOTROL XL) 2.5 MG 24 hr tablet Take 2.5 mg by mouth daily.        . metFORMIN (GLUCOPHAGE) 500 MG tablet Take 500 mg by mouth daily.        . naproxen (NAPROSYN) 500 MG tablet Take 500 mg by mouth as needed.         Marland Kitchen olmesartan (BENICAR) 40 MG tablet Take 40 mg by mouth daily. 40-25 MG tablet daily         Allergy: No Known Allergies  Social Hx:   History   Social History  . Marital Status: Married    Spouse Name: N/A    Number of Children: N/A  . Years of Education: N/A   Occupational History  . Not on file.   Social History Main Topics  . Smoking status: Never Smoker   . Smokeless tobacco: Not on file  . Alcohol Use: No  . Drug Use: No  . Sexually Active: No   Other Topics Concern  . Not on file   Social History Narrative  . No narrative on file    Past Surgical Hx:  Past Surgical History  Procedure Date  . Rotator cuff repair     right arm  . Knee surgery     left knee  . Abdominal hysterectomy     robotic hyst BSO Bilateral LND    Past Medical Hx:  Past Medical History  Diagnosis Date  . Cancer     endometrial cancer  . Diabetes mellitus   . Arthritis   . Hypertension   . Hyperlipemia     Family Hx: History reviewed. No pertinent  family history.  Vitals:  Blood pressure 140/66, pulse 78, temperature 98 F (36.7 C), temperature source Oral, resp. rate 18, height 5\' 1"  (1.549 m), weight 245 lb 14.4 oz (111.54 kg).  Physical Exam: Well-nourished well-developed female in no acute distress.  Neck: No lymphadenopathy no thyromegaly.  Lungs: Clear to auscultation bilaterally.  Cardiovascular: Regular rate and rhythm.  Abdomen morbidly obese soft nontender nondistended no palpable masses or hepatosplenomegaly. Exam is limited by habitus.  Groins: No lymphadenopathy.  Extremities: About 1-2+ nonpitting edema equal bilaterally. Left maybe somewhat greater than right. Chronic venous insufficiency changes on the left.  Pelvic: Normal external female genitalia. The vagina is atrophic. The vaginal cuff is no visible lesions. Bimanual exam reveals no masses or nodularity rectal confirms.  Assessment/Plan: 77 year old with history of a stage IA grade 1  endometrial carcinoma her clinically has no evidence of recurrent disease. Plan she'll follow up with Dr. Estanislado Pandy in 6 months. I also encouraged her to followup with her to discuss her urinary incontinence. She'll return to see [place] in one year or when necessary.  Erez Mccallum A., MD 12/10/2011, 11:49 AM

## 2011-12-10 NOTE — Patient Instructions (Signed)
Return to see [place] in one year and Dr. Estanislado Pandy in 6 months.

## 2012-02-02 DIAGNOSIS — M5137 Other intervertebral disc degeneration, lumbosacral region: Secondary | ICD-10-CM | POA: Diagnosis not present

## 2012-02-12 DIAGNOSIS — H2510 Age-related nuclear cataract, unspecified eye: Secondary | ICD-10-CM | POA: Diagnosis not present

## 2012-02-12 DIAGNOSIS — H4011X0 Primary open-angle glaucoma, stage unspecified: Secondary | ICD-10-CM | POA: Diagnosis not present

## 2012-03-12 DIAGNOSIS — E7800 Pure hypercholesterolemia, unspecified: Secondary | ICD-10-CM | POA: Diagnosis not present

## 2012-03-12 DIAGNOSIS — I10 Essential (primary) hypertension: Secondary | ICD-10-CM | POA: Diagnosis not present

## 2012-03-12 DIAGNOSIS — E119 Type 2 diabetes mellitus without complications: Secondary | ICD-10-CM | POA: Diagnosis not present

## 2012-03-12 DIAGNOSIS — I872 Venous insufficiency (chronic) (peripheral): Secondary | ICD-10-CM | POA: Diagnosis not present

## 2012-03-22 DIAGNOSIS — J45909 Unspecified asthma, uncomplicated: Secondary | ICD-10-CM | POA: Diagnosis not present

## 2012-04-09 DIAGNOSIS — I872 Venous insufficiency (chronic) (peripheral): Secondary | ICD-10-CM | POA: Diagnosis not present

## 2012-05-31 DIAGNOSIS — N3281 Overactive bladder: Secondary | ICD-10-CM | POA: Insufficient documentation

## 2012-05-31 DIAGNOSIS — E7800 Pure hypercholesterolemia, unspecified: Secondary | ICD-10-CM | POA: Insufficient documentation

## 2012-05-31 DIAGNOSIS — E119 Type 2 diabetes mellitus without complications: Secondary | ICD-10-CM | POA: Insufficient documentation

## 2012-05-31 DIAGNOSIS — I10 Essential (primary) hypertension: Secondary | ICD-10-CM | POA: Insufficient documentation

## 2012-06-03 ENCOUNTER — Ambulatory Visit (INDEPENDENT_AMBULATORY_CARE_PROVIDER_SITE_OTHER): Payer: Medicare Other | Admitting: Obstetrics and Gynecology

## 2012-06-03 ENCOUNTER — Encounter: Payer: Self-pay | Admitting: Obstetrics and Gynecology

## 2012-06-03 VITALS — BP 112/52 | Wt 243.0 lb

## 2012-06-03 DIAGNOSIS — C541 Malignant neoplasm of endometrium: Secondary | ICD-10-CM

## 2012-06-03 DIAGNOSIS — Z719 Counseling, unspecified: Secondary | ICD-10-CM

## 2012-06-03 DIAGNOSIS — C549 Malignant neoplasm of corpus uteri, unspecified: Secondary | ICD-10-CM

## 2012-06-03 NOTE — Progress Notes (Signed)
6 moth f/u from Dr.Gehrig .

## 2012-06-03 NOTE — Progress Notes (Signed)
Subjective:    Jenna [name] is a 78 y.o. female G7P7000 who presents for 6 months follow-up from Dr Duard Brady: stage 1B EM cancer 02/2008 s/p robotic hyst with BSO  Denies pain or vaginal discharge. Bladder: nycturia x3, urgency, USI, needs to wear protection daily, incomplete emptying, no dysuria. No hematuria. Very disruptive to quality of life.   Mammogram due. Colonoscopy due in 3 years                The patient has no complaints today.   The following portions of the patient's history were reviewed and updated as appropriate: allergies, current medications, past family history, past medical history, past social history, past surgical history and problem list.  Review of Systems Pertinent items are noted in HPI. Gastrointestinal:No change in bowel habits, no abdominal pain, no rectal bleeding Genitourinary:negative for dysuria, frequency, hematuria, nocturia and urinary incontinence    Objective:     BP 112/52  Wt 243 lb (110.224 kg)  Weight:  Wt Readings from Last 1 Encounters:  06/03/12 243 lb (110.224 kg)     BMI: There is no height on file to calculate BMI. General Appearance: Alert, appropriate appearance for age. No acute distress HEENT: Grossly normal Neck / Thyroid: Supple, no masses, nodes or enlargement Lungs: clear to auscultation bilaterally Back: No CVA tenderness Breast Exam: deferred today Cardiovascular: Regular rate and rhythm. S1, S2, no murmur Gastrointestinal: Soft, non-tender, no masses or organomegaly Pelvic Exam: Vulva,vagina normal. pelvic normal Rectovaginal: normal rectal, no masses Lymphatic Exam: Non-palpable nodes in neck, clavicular, axillary, or inguinal regions Skin: no rash or abnormalities Neurologic: Normal gait and speech, no tremor  Psychiatric: Alert and oriented, appropriate affect.          Assessment:    Normal gyn exam  Mixed incontinence   Plan:    Lumax and consult with dr Su Hilt for possible TVT   Follow-up:  for  annual exam in December

## 2012-06-14 ENCOUNTER — Telehealth: Payer: Self-pay | Admitting: Obstetrics and Gynecology

## 2012-06-14 NOTE — Telephone Encounter (Signed)
Call to pt to schedule Lumax per Dr.Rivard scheduled on 06/18/12 @ 11am DF

## 2012-06-17 DIAGNOSIS — H4011X0 Primary open-angle glaucoma, stage unspecified: Secondary | ICD-10-CM | POA: Diagnosis not present

## 2012-06-18 ENCOUNTER — Ambulatory Visit (INDEPENDENT_AMBULATORY_CARE_PROVIDER_SITE_OTHER): Payer: Medicare Other | Admitting: Obstetrics and Gynecology

## 2012-06-18 DIAGNOSIS — N390 Urinary tract infection, site not specified: Secondary | ICD-10-CM | POA: Diagnosis not present

## 2012-06-18 LAB — POCT URINALYSIS DIPSTICK
Bilirubin, UA: NEGATIVE
Blood, UA: NEGATIVE
Spec Grav, UA: 1.015
pH, UA: 5

## 2012-06-18 MED ORDER — SULFAMETHOXAZOLE-TRIMETHOPRIM 800-160 MG PO TABS: 1.0000 | ORAL_TABLET | Freq: Two times a day (BID) | ORAL | Status: AC

## 2012-06-18 NOTE — Progress Notes (Signed)
Consulted with Dr.Haygood per Chem 9 results from today's visit. Pt is asymptomatic;however Dr.Haygood want to prescribe Septra DS 1 tablet daily x 3days. Pt informed to increase water intake and cranberry juice. Pt voices understanding.

## 2012-07-09 DIAGNOSIS — M2550 Pain in unspecified joint: Secondary | ICD-10-CM | POA: Diagnosis not present

## 2012-07-16 ENCOUNTER — Telehealth: Payer: Self-pay | Admitting: Obstetrics and Gynecology

## 2012-07-16 NOTE — Telephone Encounter (Signed)
Call to pt scheduled lumax F/u on 07/26/12 df

## 2012-07-20 ENCOUNTER — Other Ambulatory Visit (HOSPITAL_COMMUNITY): Payer: Self-pay | Admitting: Family Medicine

## 2012-07-20 DIAGNOSIS — Z1231 Encounter for screening mammogram for malignant neoplasm of breast: Secondary | ICD-10-CM

## 2012-07-26 ENCOUNTER — Ambulatory Visit (INDEPENDENT_AMBULATORY_CARE_PROVIDER_SITE_OTHER): Payer: Medicare Other | Admitting: Obstetrics and Gynecology

## 2012-07-26 ENCOUNTER — Encounter: Payer: Self-pay | Admitting: Obstetrics and Gynecology

## 2012-07-26 VITALS — BP 130/62 | Ht 61.0 in | Wt 244.0 lb

## 2012-07-26 DIAGNOSIS — N3281 Overactive bladder: Secondary | ICD-10-CM

## 2012-07-26 DIAGNOSIS — N318 Other neuromuscular dysfunction of bladder: Secondary | ICD-10-CM | POA: Diagnosis not present

## 2012-07-26 MED ORDER — DARIFENACIN HYDROBROMIDE ER 7.5 MG PO TB24: 7.5000 mg | ORAL_TABLET | Freq: Every day | ORAL | Status: DC

## 2012-07-26 NOTE — Progress Notes (Signed)
C/o leaking mostly with urgency. Lumax reveals SUI with ISD but pt had UTI at the time and refuses to repeat the test.  She takes Benicar.  Filed Vitals:   07/26/12 1517  BP: 130/62   A/P OAB (clinically is her main complaint) Trial of enablex (Rx to pharmacy so pt can start looking into cost) if Dr. Mitzi Davenport her opthamologist says she does not have closed angle glaucoma. RTO to f/u meds

## 2012-07-27 ENCOUNTER — Telehealth: Payer: Self-pay

## 2012-07-27 NOTE — Telephone Encounter (Signed)
Left message for pt to return call regarding ok to start new medication. Mathis Bud

## 2012-07-27 NOTE — Telephone Encounter (Signed)
Pt returned call and was advised that she could start medication that Dr. Su Hilt prescribed being that she has open angle glaucoma. Mathis Bud

## 2012-08-05 ENCOUNTER — Ambulatory Visit (HOSPITAL_COMMUNITY)
Admission: RE | Admit: 2012-08-05 | Discharge: 2012-08-05 | Disposition: A | Discharge status: Home / Self Care | Payer: Medicare Other | Source: Ambulatory Visit | Attending: Family Medicine | Admitting: Family Medicine

## 2012-08-05 DIAGNOSIS — Z1231 Encounter for screening mammogram for malignant neoplasm of breast: Secondary | ICD-10-CM | POA: Insufficient documentation

## 2012-08-09 ENCOUNTER — Other Ambulatory Visit: Payer: Self-pay | Admitting: Family Medicine

## 2012-08-09 DIAGNOSIS — R928 Other abnormal and inconclusive findings on diagnostic imaging of breast: Secondary | ICD-10-CM

## 2012-08-19 ENCOUNTER — Ambulatory Visit
Admission: RE | Admit: 2012-08-19 | Discharge: 2012-08-19 | Disposition: A | Discharge status: Home / Self Care | Payer: Medicare Other | Source: Ambulatory Visit | Attending: Family Medicine | Admitting: Family Medicine

## 2012-08-19 DIAGNOSIS — R928 Other abnormal and inconclusive findings on diagnostic imaging of breast: Secondary | ICD-10-CM | POA: Diagnosis not present

## 2012-08-31 DIAGNOSIS — I872 Venous insufficiency (chronic) (peripheral): Secondary | ICD-10-CM | POA: Diagnosis not present

## 2012-09-10 DIAGNOSIS — R609 Edema, unspecified: Secondary | ICD-10-CM | POA: Diagnosis not present

## 2012-09-10 DIAGNOSIS — I10 Essential (primary) hypertension: Secondary | ICD-10-CM | POA: Diagnosis not present

## 2012-09-10 DIAGNOSIS — E7800 Pure hypercholesterolemia, unspecified: Secondary | ICD-10-CM | POA: Diagnosis not present

## 2012-09-10 DIAGNOSIS — E119 Type 2 diabetes mellitus without complications: Secondary | ICD-10-CM | POA: Diagnosis not present

## 2012-09-10 DIAGNOSIS — Z23 Encounter for immunization: Secondary | ICD-10-CM | POA: Diagnosis not present

## 2012-10-28 ENCOUNTER — Ambulatory Visit (INDEPENDENT_AMBULATORY_CARE_PROVIDER_SITE_OTHER): Payer: Medicare Other | Admitting: Obstetrics and Gynecology

## 2012-10-28 ENCOUNTER — Encounter: Payer: Self-pay | Admitting: Obstetrics and Gynecology

## 2012-10-28 VITALS — BP 120/64 | Ht 61.0 in | Wt 245.0 lb

## 2012-10-28 DIAGNOSIS — N318 Other neuromuscular dysfunction of bladder: Secondary | ICD-10-CM | POA: Diagnosis not present

## 2012-10-28 DIAGNOSIS — Z719 Counseling, unspecified: Secondary | ICD-10-CM

## 2012-10-28 DIAGNOSIS — N3281 Overactive bladder: Secondary | ICD-10-CM

## 2012-10-28 MED ORDER — DARIFENACIN HYDROBROMIDE ER 15 MG PO TB24: 15.0000 mg | ORAL_TABLET | Freq: Every day | ORAL | Status: DC

## 2012-10-28 NOTE — Progress Notes (Signed)
Here to f/u enablex Pt says it is helping some with no side effects  Filed Vitals:   10/28/12 1019  BP: 120/64   A/P Pt only taking 7.5mg .  Will inc to 15 mg for a trial. RTO in 3-23mths Kegel exercises and cont to void regularly q3hrs

## 2012-11-04 DIAGNOSIS — H4011X0 Primary open-angle glaucoma, stage unspecified: Secondary | ICD-10-CM | POA: Diagnosis not present

## 2012-12-09 ENCOUNTER — Encounter: Payer: Self-pay | Admitting: Obstetrics and Gynecology

## 2012-12-09 ENCOUNTER — Ambulatory Visit (INDEPENDENT_AMBULATORY_CARE_PROVIDER_SITE_OTHER): Payer: Medicare Other | Admitting: Obstetrics and Gynecology

## 2012-12-09 VITALS — BP 132/72 | Ht 60.0 in | Wt 238.0 lb

## 2012-12-09 DIAGNOSIS — C549 Malignant neoplasm of corpus uteri, unspecified: Secondary | ICD-10-CM | POA: Diagnosis not present

## 2012-12-09 DIAGNOSIS — Z9189 Other specified personal risk factors, not elsewhere classified: Secondary | ICD-10-CM

## 2012-12-09 DIAGNOSIS — Z01419 Encounter for gynecological examination (general) (routine) without abnormal findings: Secondary | ICD-10-CM | POA: Diagnosis not present

## 2012-12-09 DIAGNOSIS — Z124 Encounter for screening for malignant neoplasm of cervix: Secondary | ICD-10-CM

## 2012-12-09 NOTE — Progress Notes (Signed)
The patient is not taking hormone replacement therapy The patient  is not taking a Calcium supplement. Post-menopausal bleeding:no  Last Pap: approximate date 05/152012 and was normal  Last mammogram: approximate date 08/20/2011 and was normal  Last DEXA scan : T= 0.29 July 2010 Last colonoscopy:normal} 2011  Urinary symptoms: none Normal bowel movements: Yes /  Some constipation due to pain medication Reports abuse at home: No:  Subjective:    Jenna [name] is a 78 y.o. female G7P7000 who presents for annual exam.  The patient has no complaints today.   The following portions of the patient's history were reviewed and updated as appropriate: allergies, current medications, past family history, past medical history, past social history, past surgical history and problem list.  Review of Systems Pertinent items are noted in HPI. Gastrointestinal:No change in bowel habits, no abdominal pain, no rectal bleeding Genitourinary:negative for dysuria, frequency, hematuria, nocturia and urinary incontinence    Objective:     BP 132/72  Ht 5' (1.524 m)  Wt 238 lb (107.956 kg)  BMI 46.48 kg/m2  Weight:  Wt Readings from Last 1 Encounters:  12/09/12 238 lb (107.956 kg)     BMI: Body mass index is 46.48 kg/(m^2). General Appearance: Alert, appropriate appearance for age. No acute distress HEENT: Grossly normal Neck / Thyroid: Supple, no masses, nodes or enlargement Lungs: clear to auscultation bilaterally Back: No CVA tenderness Breast Exam: No dimpling, nipple retraction or discharge. No masses or nodes. and No masses or nodes.No dimpling, nipple retraction or discharge. Cardiovascular: Regular rate and rhythm. S1, S2, no murmur Gastrointestinal: Soft, non-tender, no masses or organomegaly Pelvic Exam: Vulva and vagina appear normal. uterus and adnexa.surgically absent Rectovaginal: normal rectal, no masses Lymphatic Exam: Non-palpable nodes in neck, clavicular, axillary, or  inguinal regions Skin: no rash or abnormalities Neurologic: Normal gait and speech, no tremor  Psychiatric: Alert and oriented, appropriate affect.    Assessment:    Normal gyn exam  4 year survivor of Stage 1 endometrial cancer   Plan:   Pap Smear done mammogram return annually or prn Daily stool softener reccommended while taking pain medication   Silverio Lay MD

## 2012-12-13 LAB — PAP IG W/ RFLX HPV ASCU

## 2012-12-14 DIAGNOSIS — E119 Type 2 diabetes mellitus without complications: Secondary | ICD-10-CM | POA: Diagnosis not present

## 2013-02-23 DIAGNOSIS — I872 Venous insufficiency (chronic) (peripheral): Secondary | ICD-10-CM | POA: Diagnosis not present

## 2013-03-10 DIAGNOSIS — E1139 Type 2 diabetes mellitus with other diabetic ophthalmic complication: Secondary | ICD-10-CM | POA: Diagnosis not present

## 2013-03-10 DIAGNOSIS — H4011X0 Primary open-angle glaucoma, stage unspecified: Secondary | ICD-10-CM | POA: Diagnosis not present

## 2013-03-10 DIAGNOSIS — H2510 Age-related nuclear cataract, unspecified eye: Secondary | ICD-10-CM | POA: Diagnosis not present

## 2013-03-17 DIAGNOSIS — I10 Essential (primary) hypertension: Secondary | ICD-10-CM | POA: Diagnosis not present

## 2013-03-17 DIAGNOSIS — Z0000 Encounter for general adult medical examination without abnormal findings: Secondary | ICD-10-CM | POA: Diagnosis not present

## 2013-03-17 DIAGNOSIS — E119 Type 2 diabetes mellitus without complications: Secondary | ICD-10-CM | POA: Diagnosis not present

## 2013-03-17 DIAGNOSIS — E7800 Pure hypercholesterolemia, unspecified: Secondary | ICD-10-CM | POA: Diagnosis not present

## 2013-05-13 DIAGNOSIS — M1710 Unilateral primary osteoarthritis, unspecified knee: Secondary | ICD-10-CM | POA: Diagnosis not present

## 2013-05-18 DIAGNOSIS — J309 Allergic rhinitis, unspecified: Secondary | ICD-10-CM | POA: Diagnosis not present

## 2013-05-18 DIAGNOSIS — J069 Acute upper respiratory infection, unspecified: Secondary | ICD-10-CM | POA: Diagnosis not present

## 2013-05-27 DIAGNOSIS — I872 Venous insufficiency (chronic) (peripheral): Secondary | ICD-10-CM | POA: Diagnosis not present

## 2013-07-14 DIAGNOSIS — H2510 Age-related nuclear cataract, unspecified eye: Secondary | ICD-10-CM | POA: Diagnosis not present

## 2013-07-14 DIAGNOSIS — H4011X0 Primary open-angle glaucoma, stage unspecified: Secondary | ICD-10-CM | POA: Diagnosis not present

## 2013-08-23 DIAGNOSIS — E119 Type 2 diabetes mellitus without complications: Secondary | ICD-10-CM | POA: Diagnosis not present

## 2013-09-16 DIAGNOSIS — I872 Venous insufficiency (chronic) (peripheral): Secondary | ICD-10-CM | POA: Diagnosis not present

## 2013-09-27 ENCOUNTER — Other Ambulatory Visit (HOSPITAL_COMMUNITY): Payer: Self-pay | Admitting: Family Medicine

## 2013-09-27 DIAGNOSIS — Z1231 Encounter for screening mammogram for malignant neoplasm of breast: Secondary | ICD-10-CM

## 2013-09-29 DIAGNOSIS — E119 Type 2 diabetes mellitus without complications: Secondary | ICD-10-CM | POA: Diagnosis not present

## 2013-09-29 DIAGNOSIS — Z23 Encounter for immunization: Secondary | ICD-10-CM | POA: Diagnosis not present

## 2013-09-29 DIAGNOSIS — I10 Essential (primary) hypertension: Secondary | ICD-10-CM | POA: Diagnosis not present

## 2013-09-29 DIAGNOSIS — E785 Hyperlipidemia, unspecified: Secondary | ICD-10-CM | POA: Diagnosis not present

## 2013-10-03 ENCOUNTER — Ambulatory Visit (HOSPITAL_COMMUNITY)
Admission: RE | Admit: 2013-10-03 | Discharge: 2013-10-03 | Disposition: A | Discharge status: Home / Self Care | Payer: Medicare Other | Source: Ambulatory Visit | Attending: Family Medicine | Admitting: Family Medicine

## 2013-10-03 DIAGNOSIS — Z1231 Encounter for screening mammogram for malignant neoplasm of breast: Secondary | ICD-10-CM

## 2013-10-13 DIAGNOSIS — H4011X0 Primary open-angle glaucoma, stage unspecified: Secondary | ICD-10-CM | POA: Diagnosis not present

## 2013-10-13 DIAGNOSIS — H2510 Age-related nuclear cataract, unspecified eye: Secondary | ICD-10-CM | POA: Diagnosis not present

## 2013-10-13 DIAGNOSIS — E1139 Type 2 diabetes mellitus with other diabetic ophthalmic complication: Secondary | ICD-10-CM | POA: Diagnosis not present

## 2013-11-21 DIAGNOSIS — M19049 Primary osteoarthritis, unspecified hand: Secondary | ICD-10-CM | POA: Diagnosis not present

## 2013-12-15 DIAGNOSIS — Z01419 Encounter for gynecological examination (general) (routine) without abnormal findings: Secondary | ICD-10-CM | POA: Diagnosis not present

## 2013-12-15 DIAGNOSIS — N318 Other neuromuscular dysfunction of bladder: Secondary | ICD-10-CM | POA: Diagnosis not present

## 2013-12-15 DIAGNOSIS — M899 Disorder of bone, unspecified: Secondary | ICD-10-CM | POA: Diagnosis not present

## 2013-12-15 DIAGNOSIS — Z124 Encounter for screening for malignant neoplasm of cervix: Secondary | ICD-10-CM | POA: Diagnosis not present

## 2014-01-27 DIAGNOSIS — M19049 Primary osteoarthritis, unspecified hand: Secondary | ICD-10-CM | POA: Diagnosis not present

## 2014-03-10 DIAGNOSIS — H4011X0 Primary open-angle glaucoma, stage unspecified: Secondary | ICD-10-CM | POA: Diagnosis not present

## 2014-03-24 DIAGNOSIS — J309 Allergic rhinitis, unspecified: Secondary | ICD-10-CM | POA: Diagnosis not present

## 2014-03-24 DIAGNOSIS — Z0000 Encounter for general adult medical examination without abnormal findings: Secondary | ICD-10-CM | POA: Diagnosis not present

## 2014-03-24 DIAGNOSIS — E785 Hyperlipidemia, unspecified: Secondary | ICD-10-CM | POA: Diagnosis not present

## 2014-03-24 DIAGNOSIS — I10 Essential (primary) hypertension: Secondary | ICD-10-CM | POA: Diagnosis not present

## 2014-03-24 DIAGNOSIS — Z23 Encounter for immunization: Secondary | ICD-10-CM | POA: Diagnosis not present

## 2014-03-24 DIAGNOSIS — E119 Type 2 diabetes mellitus without complications: Secondary | ICD-10-CM | POA: Diagnosis not present

## 2014-05-05 DIAGNOSIS — I83009 Varicose veins of unspecified lower extremity with ulcer of unspecified site: Secondary | ICD-10-CM | POA: Diagnosis not present

## 2014-05-12 ENCOUNTER — Other Ambulatory Visit: Payer: Self-pay | Admitting: Orthopedic Surgery

## 2014-05-12 DIAGNOSIS — R52 Pain, unspecified: Secondary | ICD-10-CM

## 2014-05-12 DIAGNOSIS — I872 Venous insufficiency (chronic) (peripheral): Secondary | ICD-10-CM | POA: Diagnosis not present

## 2014-05-12 DIAGNOSIS — R609 Edema, unspecified: Secondary | ICD-10-CM

## 2014-05-16 ENCOUNTER — Ambulatory Visit
Admission: RE | Admit: 2014-05-16 | Discharge: 2014-05-16 | Disposition: A | Discharge status: Home / Self Care | Payer: Medicare Other | Source: Ambulatory Visit | Attending: Orthopedic Surgery | Admitting: Orthopedic Surgery

## 2014-05-16 DIAGNOSIS — M7989 Other specified soft tissue disorders: Secondary | ICD-10-CM | POA: Diagnosis not present

## 2014-05-16 DIAGNOSIS — M79609 Pain in unspecified limb: Secondary | ICD-10-CM | POA: Diagnosis not present

## 2014-05-16 DIAGNOSIS — R609 Edema, unspecified: Secondary | ICD-10-CM

## 2014-05-16 DIAGNOSIS — R52 Pain, unspecified: Secondary | ICD-10-CM

## 2014-05-21 ENCOUNTER — Emergency Department (HOSPITAL_COMMUNITY)
Admission: EM | Admit: 2014-05-21 | Discharge: 2014-05-21 | Disposition: A | Discharge status: Home / Self Care | Payer: Medicare Other | Attending: Emergency Medicine | Admitting: Emergency Medicine

## 2014-05-21 ENCOUNTER — Encounter (HOSPITAL_COMMUNITY): Payer: Self-pay | Admitting: Emergency Medicine

## 2014-05-21 DIAGNOSIS — Z7982 Long term (current) use of aspirin: Secondary | ICD-10-CM | POA: Diagnosis not present

## 2014-05-21 DIAGNOSIS — Z8542 Personal history of malignant neoplasm of other parts of uterus: Secondary | ICD-10-CM | POA: Diagnosis not present

## 2014-05-21 DIAGNOSIS — E785 Hyperlipidemia, unspecified: Secondary | ICD-10-CM | POA: Diagnosis not present

## 2014-05-21 DIAGNOSIS — E119 Type 2 diabetes mellitus without complications: Secondary | ICD-10-CM | POA: Insufficient documentation

## 2014-05-21 DIAGNOSIS — Z79899 Other long term (current) drug therapy: Secondary | ICD-10-CM | POA: Diagnosis not present

## 2014-05-21 DIAGNOSIS — Z87448 Personal history of other diseases of urinary system: Secondary | ICD-10-CM | POA: Diagnosis not present

## 2014-05-21 DIAGNOSIS — I10 Essential (primary) hypertension: Secondary | ICD-10-CM | POA: Insufficient documentation

## 2014-05-21 DIAGNOSIS — M129 Arthropathy, unspecified: Secondary | ICD-10-CM | POA: Diagnosis not present

## 2014-05-21 DIAGNOSIS — I8000 Phlebitis and thrombophlebitis of superficial vessels of unspecified lower extremity: Secondary | ICD-10-CM | POA: Diagnosis not present

## 2014-05-21 MED ORDER — SULFAMETHOXAZOLE-TMP DS 800-160 MG PO TABS
1.0000 | ORAL_TABLET | Freq: Once | ORAL | Status: AC
  Administered 2014-05-21: 1 via ORAL
  Filled 2014-05-21: qty 1

## 2014-05-21 MED ORDER — SULFAMETHOXAZOLE-TRIMETHOPRIM 800-160 MG PO TABS: 1.0000 | ORAL_TABLET | Freq: Two times a day (BID) | ORAL | Status: DC

## 2014-05-21 NOTE — ED Notes (Signed)
Pt c/o RLE swelling x 2 days, redness noted tonight.

## 2014-05-21 NOTE — Discharge Instructions (Signed)

## 2014-05-21 NOTE — ED Provider Notes (Signed)
CSN: [id number]     Arrival date & time 05/21/14  2129 History   First MD Initiated Contact with Patient 05/21/14 2147     Chief Complaint  Patient presents with  . Leg Swelling     (Consider location/radiation/quality/duration/timing/severity/associated sxs/prior Treatment) HPI Patient reports worsening swelling and some warmth of her right lower extremity in her posterior right proximal calf over the past 2 days.  No history of DVT or pulmonary embolism.  No recent trauma or injury to her right calf.  No fevers or chills.  She otherwise feels fine.  She's been able to ambulate without difficulty.  Her only discomfort comes from palpation of this region.  She spoke with the orthopedic surgeon who recommended that she come to the emergency department.  No recent surgery.  On an 81 mg aspirin but no other anticoagulants   Past Medical History  Diagnosis Date  . Cancer     endometrial cancer  . Diabetes mellitus   . Arthritis   . Hypertension   . Hyperlipemia   . OAB (overactive bladder)    Past Surgical History  Procedure Laterality Date  . Rotator cuff repair      right arm  . Knee surgery      left knee  . Tubal ligation    . Eye surgery      cataract  . Joint replacement      knee  . Abdominal hysterectomy  02/2008    robotic hyst BSO Bilateral LND   Family History  Problem Relation Age of Onset  . Cancer Brother 78    rectal  . Early death Brother    History  Substance Use Topics  . Smoking status: Never Smoker   . Smokeless tobacco: Never Used  . Alcohol Use: No   OB History   Grav Para Term Preterm Abortions TAB SAB Ect Mult Living   7 7 7             Review of Systems  All other systems reviewed and are negative.     Allergies  Ancef and Shrimp  Home Medications   Prior to Admission medications   Medication Sig Start Date End Date Taking? Authorizing Provider  amLODipine (NORVASC) 10 MG tablet Take 10 mg by mouth daily.      Historical Provider,  MD  aspirin 81 MG tablet Take 81 mg by mouth daily.    Historical Provider, MD  atorvastatin (LIPITOR) 20 MG tablet Take 20 mg by mouth daily.      Historical Provider, MD  brimonidine-timolol (COMBIGAN) 0.2-0.5 % ophthalmic solution Place 1 drop into both eyes 1 day or 1 dose.      Historical Provider, MD  darifenacin (ENABLEX) 15 MG 24 hr tablet Take 1 tablet (15 mg total) by mouth daily. 10/28/12   Delice Lesch, MD  darifenacin (ENABLEX) 7.5 MG 24 hr tablet Take 1 tablet (7.5 mg total) by mouth daily. May take up to 2 tablets qd after first 4wks. 07/26/12 10/24/12  Delice Lesch, MD  glipiZIDE (GLUCOTROL XL) 2.5 MG 24 hr tablet Take 2.5 mg by mouth daily.      Historical Provider, MD  latanoprost (XALATAN) 0.005 % ophthalmic solution 1 drop at bedtime.    Historical Provider, MD  metFORMIN (GLUCOPHAGE) 500 MG tablet Take 500 mg by mouth daily.      Historical Provider, MD  naproxen (NAPROSYN) 500 MG tablet Take 500 mg by mouth as needed.  Historical Provider, MD  olmesartan (BENICAR) 40 MG tablet Take 40 mg by mouth daily. 40-25 MG tablet daily     Historical Provider, MD  sulfamethoxazole-trimethoprim (SEPTRA DS) 800-160 MG per tablet Take 1 tablet by mouth every 12 (twelve) hours. 05/21/14   Hoy Morn, MD   BP 160/59  Pulse 78  Temp(Src) 98.2 F (36.8 C) (Oral)  Resp 16  Ht 5\' 1"  (1.549 m)  Wt 230 lb (104.327 kg)  BMI 43.48 kg/m2  SpO2 98% Physical Exam  Nursing note and vitals reviewed. Constitutional: She is oriented to person, place, and time. She appears well-developed and well-nourished.  HENT:  Head: Normocephalic.  Eyes: EOM are normal.  Neck: Normal range of motion.  Pulmonary/Chest: Effort normal.  Abdominal: She exhibits no distension.  Musculoskeletal: Normal range of motion.  Mild warmth and tenderness of her posterior proximal right calf.  No fluctuance.  No significant induration.  Palpable varicosities.  Normal DP and PT pulse right foot.   Neurological: She is alert and oriented to person, place, and time.  Psychiatric: She has a normal mood and affect.    ED Course  Procedures (including critical care time) Labs Review Labs Reviewed - No data to display  Imaging Review No results found.   EKG Interpretation None      MDM   Final diagnoses:  Thrombophlebitis leg superficial    Suspect thrombophlebitis of varicose veins in her right posterior calf.  My suspicion and biopsy warm compresses.  She has no prior history of abscess.  I do not believe this is an abscess with cellulitis at this time.  She will continue to monitor this closely at home.  She will return to the ER for new or worsening symptom. Warm compresses and antibiotics.     Hoy Morn, MD 05/21/14 2245

## 2014-05-24 ENCOUNTER — Other Ambulatory Visit: Payer: Self-pay | Admitting: Family Medicine

## 2014-05-24 DIAGNOSIS — L02419 Cutaneous abscess of limb, unspecified: Secondary | ICD-10-CM | POA: Diagnosis not present

## 2014-05-24 DIAGNOSIS — R609 Edema, unspecified: Secondary | ICD-10-CM

## 2014-05-24 DIAGNOSIS — R52 Pain, unspecified: Secondary | ICD-10-CM

## 2014-05-24 DIAGNOSIS — L03119 Cellulitis of unspecified part of limb: Secondary | ICD-10-CM | POA: Diagnosis not present

## 2014-05-25 ENCOUNTER — Ambulatory Visit
Admission: RE | Admit: 2014-05-25 | Discharge: 2014-05-25 | Disposition: A | Discharge status: Home / Self Care | Payer: Medicare Other | Source: Ambulatory Visit | Attending: Family Medicine | Admitting: Family Medicine

## 2014-05-25 DIAGNOSIS — I8000 Phlebitis and thrombophlebitis of superficial vessels of unspecified lower extremity: Secondary | ICD-10-CM | POA: Diagnosis not present

## 2014-05-25 DIAGNOSIS — R52 Pain, unspecified: Secondary | ICD-10-CM

## 2014-05-25 DIAGNOSIS — R609 Edema, unspecified: Secondary | ICD-10-CM

## 2014-05-26 DIAGNOSIS — I83009 Varicose veins of unspecified lower extremity with ulcer of unspecified site: Secondary | ICD-10-CM | POA: Diagnosis not present

## 2014-06-05 DIAGNOSIS — M719 Bursopathy, unspecified: Secondary | ICD-10-CM | POA: Diagnosis not present

## 2014-06-05 DIAGNOSIS — M67919 Unspecified disorder of synovium and tendon, unspecified shoulder: Secondary | ICD-10-CM | POA: Diagnosis not present

## 2014-06-07 ENCOUNTER — Ambulatory Visit
Admission: RE | Admit: 2014-06-07 | Discharge: 2014-06-07 | Disposition: A | Discharge status: Home / Self Care | Payer: Medicare Other | Source: Ambulatory Visit | Attending: Orthopedic Surgery | Admitting: Orthopedic Surgery

## 2014-06-07 ENCOUNTER — Other Ambulatory Visit: Payer: Self-pay | Admitting: Orthopedic Surgery

## 2014-06-07 DIAGNOSIS — M19019 Primary osteoarthritis, unspecified shoulder: Secondary | ICD-10-CM | POA: Diagnosis not present

## 2014-06-07 DIAGNOSIS — M25512 Pain in left shoulder: Secondary | ICD-10-CM

## 2014-06-07 DIAGNOSIS — R29898 Other symptoms and signs involving the musculoskeletal system: Secondary | ICD-10-CM

## 2014-06-08 DIAGNOSIS — H2510 Age-related nuclear cataract, unspecified eye: Secondary | ICD-10-CM | POA: Diagnosis not present

## 2014-06-08 DIAGNOSIS — H4011X0 Primary open-angle glaucoma, stage unspecified: Secondary | ICD-10-CM | POA: Diagnosis not present

## 2014-06-20 ENCOUNTER — Emergency Department (HOSPITAL_COMMUNITY)
Admission: EM | Admit: 2014-06-20 | Discharge: 2014-06-20 | Disposition: A | Discharge status: Home / Self Care | Payer: Medicare Other | Attending: Emergency Medicine | Admitting: Emergency Medicine

## 2014-06-20 ENCOUNTER — Emergency Department (HOSPITAL_COMMUNITY): Payer: Medicare Other

## 2014-06-20 ENCOUNTER — Encounter (HOSPITAL_COMMUNITY): Payer: Self-pay | Admitting: Emergency Medicine

## 2014-06-20 DIAGNOSIS — E785 Hyperlipidemia, unspecified: Secondary | ICD-10-CM | POA: Diagnosis not present

## 2014-06-20 DIAGNOSIS — Z8542 Personal history of malignant neoplasm of other parts of uterus: Secondary | ICD-10-CM | POA: Insufficient documentation

## 2014-06-20 DIAGNOSIS — E119 Type 2 diabetes mellitus without complications: Secondary | ICD-10-CM | POA: Diagnosis not present

## 2014-06-20 DIAGNOSIS — K8020 Calculus of gallbladder without cholecystitis without obstruction: Secondary | ICD-10-CM | POA: Diagnosis not present

## 2014-06-20 DIAGNOSIS — R109 Unspecified abdominal pain: Secondary | ICD-10-CM | POA: Diagnosis not present

## 2014-06-20 DIAGNOSIS — R10A1 Flank pain, right side: Secondary | ICD-10-CM

## 2014-06-20 DIAGNOSIS — Z87448 Personal history of other diseases of urinary system: Secondary | ICD-10-CM | POA: Insufficient documentation

## 2014-06-20 DIAGNOSIS — Z79899 Other long term (current) drug therapy: Secondary | ICD-10-CM | POA: Diagnosis not present

## 2014-06-20 DIAGNOSIS — M129 Arthropathy, unspecified: Secondary | ICD-10-CM | POA: Diagnosis not present

## 2014-06-20 DIAGNOSIS — Z7982 Long term (current) use of aspirin: Secondary | ICD-10-CM | POA: Diagnosis not present

## 2014-06-20 DIAGNOSIS — Z90710 Acquired absence of both cervix and uterus: Secondary | ICD-10-CM | POA: Insufficient documentation

## 2014-06-20 DIAGNOSIS — K8010 Calculus of gallbladder with chronic cholecystitis without obstruction: Secondary | ICD-10-CM | POA: Insufficient documentation

## 2014-06-20 DIAGNOSIS — K8080 Other cholelithiasis without obstruction: Secondary | ICD-10-CM

## 2014-06-20 DIAGNOSIS — I10 Essential (primary) hypertension: Secondary | ICD-10-CM | POA: Diagnosis not present

## 2014-06-20 LAB — CBC
HCT: 35.5 % — ABNORMAL LOW (ref 36.0–46.0)
Hemoglobin: 11.6 g/dL — ABNORMAL LOW (ref 12.0–15.0)
MCH: 27.1 pg (ref 26.0–34.0)
MCHC: 32.7 g/dL (ref 30.0–36.0)
MCV: 82.9 fL (ref 78.0–100.0)
Platelets: 277 10*3/uL (ref 150–400)
RBC: 4.28 MIL/uL (ref 3.87–5.11)
RDW: 14.2 % (ref 11.5–15.5)
WBC: 5.6 10*3/uL (ref 4.0–10.5)

## 2014-06-20 LAB — URINALYSIS, ROUTINE W REFLEX MICROSCOPIC
Glucose, UA: NEGATIVE mg/dL
Hgb urine dipstick: NEGATIVE
KETONES UR: NEGATIVE mg/dL
Leukocytes, UA: NEGATIVE
NITRITE: NEGATIVE
PROTEIN: NEGATIVE mg/dL
Specific Gravity, Urine: 1.015 (ref 1.005–1.030)
Urobilinogen, UA: 1 mg/dL (ref 0.0–1.0)
pH: 7.5 (ref 5.0–8.0)

## 2014-06-20 LAB — COMPREHENSIVE METABOLIC PANEL
ALBUMIN: 3.5 g/dL (ref 3.5–5.2)
ALK PHOS: 104 U/L (ref 39–117)
ALT: 13 U/L (ref 0–35)
AST: 14 U/L (ref 0–37)
BUN: 12 mg/dL (ref 6–23)
CALCIUM: 9.2 mg/dL (ref 8.4–10.5)
CO2: 28 mEq/L (ref 19–32)
Chloride: 99 mEq/L (ref 96–112)
Creatinine, Ser: 0.65 mg/dL (ref 0.50–1.10)
GFR calc Af Amer: >90 mL/min (ref >90)
GFR calc non Af Amer: 82 mL/min — ABNORMAL LOW (ref >90)
Glucose, Bld: 137 mg/dL — ABNORMAL HIGH (ref 70–99)
POTASSIUM: 3.6 meq/L — AB (ref 3.7–5.3)
SODIUM: 141 meq/L (ref 137–147)
TOTAL PROTEIN: 6.9 g/dL (ref 6.0–8.3)
Total Bilirubin: 0.2 mg/dL — ABNORMAL LOW (ref 0.3–1.2)

## 2014-06-20 MED ORDER — ONDANSETRON HCL 4 MG/2ML IJ SOLN
4.0000 mg | Freq: Once | INTRAMUSCULAR | Status: AC
  Administered 2014-06-20: 4 mg via INTRAVENOUS

## 2014-06-20 MED ORDER — HYDROCODONE-ACETAMINOPHEN 5-325 MG PO TABS: 1.0000 | ORAL_TABLET | ORAL | Status: DC | PRN

## 2014-06-20 MED ORDER — SODIUM CHLORIDE 0.9 % IV SOLN
INTRAVENOUS | Status: DC
  Administered 2014-06-20: 20 mL/h via INTRAVENOUS

## 2014-06-20 MED ORDER — ONDANSETRON 8 MG PO TBDP: 8.0000 mg | ORAL_TABLET | Freq: Three times a day (TID) | ORAL | Status: DC | PRN

## 2014-06-20 MED ORDER — MORPHINE SULFATE 4 MG/ML IJ SOLN
4.0000 mg | Freq: Once | INTRAMUSCULAR | Status: AC
  Administered 2014-06-20: 4 mg via INTRAVENOUS

## 2014-06-20 MED ORDER — MORPHINE SULFATE 2 MG/ML IJ SOLN
2.0000 mg | Freq: Once | INTRAMUSCULAR | Status: AC
  Administered 2014-06-20: 2 mg via INTRAVENOUS

## 2014-06-20 NOTE — Discharge Instructions (Signed)
Your ct scan was read as showing (discuss findings with your doctor(s)):  IMPRESSION: 1. Cholelithiasis. 2. No renal or ureteral calculi are identified. 3. Left inguinal structure probably represents a mildly enlarged left inguinal lymph node, but could be a venous varix. Cause for this mild lymph node enlargement is uncertain. 4. Multilevel lumbar spondylosis and degenerative disc disease, with potential impingement at L4-5 and L5-S1. 5. Mild sigmoid diverticulosis.  'Cholelithiasis' are gallstones. For gallstones, follow up with general surgeon in the next 1-2 weeks - see referral - call office tomorrow to arrange appointment. Take motrin or aleve as need for pain. You may also take hydrocodone as need for pain. No driving when taking hydrocodone. Also, do not take tylenol or acetaminophen containing medication when taking hydrocodone. You may take zofran as need for nausea.  Return to ER right away if worse, new symptoms, fevers, worsening or severe pain, persistent vomiting, other concern.    Cholelithiasis Cholelithiasis (also called gallstones) is a form of gallbladder disease in which gallstones form in your gallbladder. The gallbladder is an organ that stores bile made in the liver, which helps digest fats. Gallstones begin as small crystals and slowly grow into stones. Gallstone pain occurs when the gallbladder spasms and a gallstone is blocking the duct. Pain can also occur when a stone passes out of the duct.  RISK FACTORS  Being female.   Having multiple pregnancies. Health care providers sometimes advise removing diseased gallbladders before future pregnancies.   Being obese.  Eating a diet heavy in fried foods and fat.   Being older than 6 years and increasing age.   Prolonged use of medicines containing female hormones.   Having diabetes mellitus.   Rapidly losing weight.   Having a family history of gallstones (heredity).  SYMPTOMS  Nausea.    Vomiting.  Abdominal pain.   Yellowing of the skin (jaundice).   Sudden pain. It may persist from several minutes to several hours.  Fever.   Tenderness to the touch. In some cases, when gallstones do not move into the bile duct, people have no pain or symptoms. These are called "silent" gallstones.  TREATMENT Silent gallstones do not need treatment. In severe cases, emergency surgery may be required. Options for treatment include:  Surgery to remove the gallbladder. This is the most common treatment.  Medicines. These do not always work and may take 6-12 months or more to work.  Shock wave treatment (extracorporeal biliary lithotripsy). In this treatment an ultrasound machine sends shock waves to the gallbladder to break gallstones into smaller pieces that can pass into the intestines or be dissolved by medicine. HOME CARE INSTRUCTIONS   Only take over-the-counter or prescription medicines for pain, discomfort, or fever as directed by your health care provider.   Follow a low-fat diet until seen again by your health care provider. Fat causes the gallbladder to contract, which can result in pain.   Follow up with your health care provider as directed. Attacks are almost always recurrent and surgery is usually required for permanent treatment.  SEEK IMMEDIATE MEDICAL CARE IF:   Your pain increases and is not controlled by medicines.   You have a fever or persistent symptoms for more than 2-3 days.   You have a fever and your symptoms suddenly get worse.   You have persistent nausea and vomiting.  MAKE SURE YOU:   Understand these instructions.  Will watch your condition.  Will get help right away if you are not doing well  or get worse. Document Released: 12/11/2005 Document Revised: 08/17/2013 Document Reviewed: 06/08/2013 [facility] Patient Information 2015 [place], [place]. This information is not intended to replace advice given to you by your health care  provider. Make sure you discuss any questions you have with your health care provider.

## 2014-06-20 NOTE — ED Provider Notes (Signed)
CSN: [id number]     Arrival date & time 06/20/14  1741 History   First MD Initiated Contact with Patient 06/20/14 1754     Chief Complaint  Patient presents with  . Flank Pain     (Consider location/radiation/quality/duration/timing/severity/associated sxs/prior Treatment) Patient is a 80 y.o. female presenting with flank pain. The history is provided by the patient.  Flank Pain Pertinent negatives include no chest pain, no abdominal pain, no headaches and no shortness of breath.  pt c/o right flank pain posteriorly, acute onset this afternoon. Constant, dull, mod-severe. No hx same pain. Denies back or side strain. No fall. Denies dysuria or hematuria. No hx kidney stones. No anterior/abd or pelvic pain. No hx gallstones. Denies rash/shingles to area. No radicular pain. No chest pain or trouble breathing. No pleuritic pain. Normal appetite. No nvd. Had normal bm today.     Past Medical History  Diagnosis Date  . Cancer     endometrial cancer  . Diabetes mellitus   . Arthritis   . Hypertension   . Hyperlipemia   . OAB (overactive bladder)    Past Surgical History  Procedure Laterality Date  . Rotator cuff repair      right arm  . Knee surgery      left knee  . Tubal ligation    . Eye surgery      cataract  . Joint replacement      knee  . Abdominal hysterectomy  02/2008    robotic hyst BSO Bilateral LND   Family History  Problem Relation Age of Onset  . Cancer Brother 57    rectal  . Early death Brother    History  Substance Use Topics  . Smoking status: Never Smoker   . Smokeless tobacco: Never Used  . Alcohol Use: No   OB History   Grav Para Term Preterm Abortions TAB SAB Ect Mult Living   7 7 7             Review of Systems  Constitutional: Negative for fever and chills.  HENT: Negative for sore throat.   Eyes: Negative for redness.  Respiratory: Negative for shortness of breath.   Cardiovascular: Negative for chest pain.  Gastrointestinal: Negative  for vomiting, abdominal pain and diarrhea.  Genitourinary: Positive for flank pain. Negative for dysuria and hematuria.  Musculoskeletal: Negative for back pain and neck pain.  Skin: Negative for rash.  Neurological: Negative for headaches.  Hematological: Does not bruise/bleed easily.  Psychiatric/Behavioral: Negative for confusion.      Allergies  Ancef and Shrimp  Home Medications   Prior to Admission medications   Medication Sig Start Date End Date Taking? Authorizing Raechell Singleton  amLODipine (NORVASC) 10 MG tablet Take 10 mg by mouth daily.      Historical Myracle Febres, MD  aspirin 81 MG tablet Take 81 mg by mouth daily.    Historical Mariah Harn, MD  atorvastatin (LIPITOR) 20 MG tablet Take 20 mg by mouth daily.      Historical Rylee Nuzum, MD  brimonidine-timolol (COMBIGAN) 0.2-0.5 % ophthalmic solution Place 1 drop into both eyes 1 day or 1 dose.      Historical Johnita Palleschi, MD  darifenacin (ENABLEX) 15 MG 24 hr tablet Take 1 tablet (15 mg total) by mouth daily. 10/28/12   Delice Lesch, MD  darifenacin (ENABLEX) 7.5 MG 24 hr tablet Take 1 tablet (7.5 mg total) by mouth daily. May take up to 2 tablets qd after first 4wks. 07/26/12 10/24/12  Delice Lesch,  MD  glipiZIDE (GLUCOTROL XL) 2.5 MG 24 hr tablet Take 2.5 mg by mouth daily.      Historical Curtina Grills, MD  latanoprost (XALATAN) 0.005 % ophthalmic solution 1 drop at bedtime.    Historical Kataleia Quaranta, MD  metFORMIN (GLUCOPHAGE) 500 MG tablet Take 500 mg by mouth daily.      Historical Estle Sabella, MD  naproxen (NAPROSYN) 500 MG tablet Take 500 mg by mouth as needed.      Historical Quavion Boule, MD  olmesartan (BENICAR) 40 MG tablet Take 40 mg by mouth daily. 40-25 MG tablet daily     Historical Troi Florendo, MD  sulfamethoxazole-trimethoprim (SEPTRA DS) 800-160 MG per tablet Take 1 tablet by mouth every 12 (twelve) hours. 05/21/14   Hoy Morn, MD   BP 166/71  Pulse 76  Temp(Src) 97.3 F (36.3 C) (Oral)  Resp 20  SpO2 100% Physical Exam   Nursing note and vitals reviewed. Constitutional: She appears well-developed and well-nourished. No distress.  HENT:  Nose: Nose normal.  Eyes: Conjunctivae are normal. No scleral icterus.  Neck: Neck supple. No tracheal deviation present.  Cardiovascular: Normal rate, regular rhythm, normal heart sounds and intact distal pulses.   Pulmonary/Chest: Effort normal and breath sounds normal. No respiratory distress.  Abdominal: Soft. Normal appearance and bowel sounds are normal. She exhibits no distension and no mass. There is no tenderness. There is no rebound and no guarding.  Genitourinary:  No cva tenderness  Musculoskeletal: She exhibits no edema.  tls spine non tender. No skin changes, sts or focal tenderness.   Neurological: She is alert.  Skin: Skin is warm and dry. No rash noted.  No shingles/rash to area of pain  Psychiatric: She has a normal mood and affect.    ED Course  Procedures (including critical care time) Labs Review  Results for orders placed during the hospital encounter of 06/20/14  URINALYSIS, ROUTINE W REFLEX MICROSCOPIC      Result Value Ref Range   Color, Urine YELLOW  YELLOW   APPearance CLEAR  CLEAR   Specific Gravity, Urine 1.015  1.005 - 1.030   pH 7.5  5.0 - 8.0   Glucose, UA NEGATIVE  NEGATIVE mg/dL   Hgb urine dipstick NEGATIVE  NEGATIVE   Bilirubin Urine MODERATE (*) NEGATIVE   Ketones, ur NEGATIVE  NEGATIVE mg/dL   Protein, ur NEGATIVE  NEGATIVE mg/dL   Urobilinogen, UA 1.0  0.0 - 1.0 mg/dL   Nitrite NEGATIVE  NEGATIVE   Leukocytes, UA NEGATIVE  NEGATIVE  CBC      Result Value Ref Range   WBC 5.6  4.0 - 10.5 K/uL   RBC 4.28  3.87 - 5.11 MIL/uL   Hemoglobin 11.6 (*) 12.0 - 15.0 g/dL   HCT 35.5 (*) 36.0 - 46.0 %   MCV 82.9  78.0 - 100.0 fL   MCH 27.1  26.0 - 34.0 pg   MCHC 32.7  30.0 - 36.0 g/dL   RDW 14.2  11.5 - 15.5 %   Platelets 277  150 - 400 K/uL  COMPREHENSIVE METABOLIC PANEL      Result Value Ref Range   Sodium 141  137 - 147  mEq/L   Potassium 3.6 (*) 3.7 - 5.3 mEq/L   Chloride 99  96 - 112 mEq/L   CO2 28  19 - 32 mEq/L   Glucose, Bld 137 (*) 70 - 99 mg/dL   BUN 12  6 - 23 mg/dL   Creatinine, Ser 0.65  0.50 - 1.10 mg/dL  Calcium 9.2  8.4 - 10.5 mg/dL   Total Protein 6.9  6.0 - 8.3 g/dL   Albumin 3.5  3.5 - 5.2 g/dL   AST 14  0 - 37 U/L   ALT 13  0 - 35 U/L   Alkaline Phosphatase 104  39 - 117 U/L   Total Bilirubin 0.2 (*) 0.3 - 1.2 mg/dL   GFR calc non Af Amer 82 (*) >90 mL/min   GFR calc Af Amer >90  >90 mL/min   Ct Abdomen Pelvis Wo Contrast  06/20/2014   CLINICAL DATA:  Right flank pain.  EXAM: CT ABDOMEN AND PELVIS WITHOUT CONTRAST  TECHNIQUE: Multidetector CT imaging of the abdomen and pelvis was performed following the standard protocol without IV contrast.  COMPARISON:  None.  FINDINGS: Volume loss medially in the right lower lobe with mild airway thickening in both lower lobes.  Three gallstones are observed, measuring up to 2.0 cm in diameter.  The liver, spleen, pancreas, and adrenal glands appear unremarkable. Body habitus reduces diagnostic sensitivity and specificity.  Calcification in a tubular structure anterior to the right psoas muscle is thought to be likely within the gonadal vein rather than the ureter. Similar appearance on the left is noted with gonadal vein calcification. No definite ureteral or renal calculus.  Aortoiliac atherosclerotic vascular disease. Appendix normal. No pathologic upper abdominal adenopathy is observed. Small bilateral inguinal lymph nodes are observed. A 1.8 cm in short axis axis left inguinal structure could represent a venous varix or a mildly enlarged lymph node. Lymph node is favored.  Degenerative arthropathy of the right hip. Multilevel lumbar spondylosis and degenerative disc disease with potential impingement at L4-5 and L5-S1. Grade 1 anterolisthesis at L4-5.  Scattered descending colon and sigmoid colon diverticula. Uterus absent.  IMPRESSION: 1. Cholelithiasis.  2. No renal or ureteral calculi are identified. 3. Left inguinal structure probably represents a mildly enlarged left inguinal lymph node, but could be a venous varix. Cause for this mild lymph node enlargement is uncertain. 4. Multilevel lumbar spondylosis and degenerative disc disease, with potential impingement at L4-5 and L5-S1. 5. Mild sigmoid diverticulosis.   Electronically Signed   By: Sherryl Barters M.D.   On: 06/20/2014 19:36   US Venous Img Lower Unilateral Right  05/25/2014   CLINICAL DATA:  Right lower extremity pain and edema.  EXAM: RIGHT LOWER EXTREMITY VENOUS DOPPLER ULTRASOUND  TECHNIQUE: Gray-scale sonography with graded compression, as well as color Doppler and duplex ultrasound were performed to evaluate the lower extremity deep venous systems from the level of the common femoral vein and including the common femoral, femoral, profunda femoral, popliteal and calf veins including the posterior tibial, peroneal and gastrocnemius veins when visible. The superficial great saphenous vein was also interrogated. Spectral Doppler was utilized to evaluate flow at rest and with distal augmentation maneuvers in the common femoral, femoral and popliteal veins.  COMPARISON:  None.  FINDINGS: Common Femoral Vein: No evidence of thrombus. Normal compressibility, respiratory phasicity and response to augmentation.  Saphenofemoral Junction: No evidence of thrombus. Normal compressibility and flow on color Doppler imaging.  Profunda Femoral Vein: No evidence of thrombus. Normal compressibility and flow on color Doppler imaging.  Femoral Vein: No evidence of thrombus. Normal compressibility, respiratory phasicity and response to augmentation.  Popliteal Vein: No evidence of thrombus. Normal compressibility, respiratory phasicity and response to augmentation.  Calf Veins: No evidence of thrombus. Normal compressibility and flow on color Doppler imaging.  Superficial Great Saphenous Vein: The right GSV is  mildly thickened in appearance but does compress Foley. This may reflect prior thrombophlebitis. There is acute thrombophlebitis involving multiple calf varicosities as well as the short saphenous vein in the proximal to mid calf. No abnormal fluid collections are seen.  Venous Reflux:  None.  Other Findings:  None.  IMPRESSION: No evidence of right lower extremity DVT. Superficial thrombophlebitis is seen involving multiple calf varicosities as well as a segment of the short saphenous vein in the calf.   Electronically Signed   By: Aletta Edouard M.D.   On: 05/25/2014 11:04   Dg Shoulder Left  06/07/2014   CLINICAL DATA:  Persistent pain.  EXAM: LEFT SHOULDER - 2+ VIEW  COMPARISON:  None.  FINDINGS: There are small loose bodies along the anterior aspect of the left humeral neck. Degenerative changes at the left [facility] joint. The left shoulder is located without acute fracture. Mild degenerative changes at the left glenohumeral joint. Visualized left ribs are intact. No gross abnormality to the scapula.  IMPRESSION: No acute bone abnormality to the left shoulder.  Degenerative changes.  Loose bodies along the anterior aspect of the proximal left humerus.   Electronically Signed   By: Markus Daft M.D.   On: 06/07/2014 14:07     MDM   Iv ns. Morphine iv. zofran iv. Labs.  Ct.  Reviewed nursing notes and prior charts for additional history.   Morphine 2 mg iv.   Recheck pt comfortable. abd soft nt.  Discussed ct w pt.   Will refer to gen surgery follow up.  Symptoms resolved. No fever, no abd tenderness. Pt appears stable for d/c.    Mirna Mires, MD 06/20/14 2031

## 2014-06-20 NOTE — ED Notes (Signed)
Per EMS, Pt c/o sudden onset of R flank pain that radiates to stomach and back. A&Ox4. Pt from home. Pt. Denies urinary symptoms. Pt c/o mild nausea that is no longer bothering her.

## 2014-06-20 NOTE — ED Notes (Signed)
Bed: WA17 Expected date:  Expected time:  Means of arrival:  Comments: ems- flank pain

## 2014-06-23 DIAGNOSIS — K8020 Calculus of gallbladder without cholecystitis without obstruction: Secondary | ICD-10-CM | POA: Diagnosis not present

## 2014-06-23 DIAGNOSIS — R1011 Right upper quadrant pain: Secondary | ICD-10-CM | POA: Diagnosis not present

## 2014-06-23 DIAGNOSIS — I10 Essential (primary) hypertension: Secondary | ICD-10-CM | POA: Diagnosis not present

## 2014-06-28 ENCOUNTER — Ambulatory Visit (INDEPENDENT_AMBULATORY_CARE_PROVIDER_SITE_OTHER): Payer: Medicare Other | Admitting: General Surgery

## 2014-06-28 ENCOUNTER — Encounter (INDEPENDENT_AMBULATORY_CARE_PROVIDER_SITE_OTHER): Payer: Self-pay | Admitting: General Surgery

## 2014-06-28 VITALS — BP 130/65 | HR 83 | Temp 97.7°F | Resp 16 | Ht 60.5 in | Wt 233.8 lb

## 2014-06-28 DIAGNOSIS — K8020 Calculus of gallbladder without cholecystitis without obstruction: Secondary | ICD-10-CM | POA: Diagnosis not present

## 2014-06-28 NOTE — Patient Instructions (Signed)
You have several gallstones on your x-ray test. The x-rays and lab work did not show any other problem.  It is highly likely that you will have further attacks in the future, but I cannot predict when.  You have decided to go ahead with an elective laparoscopic cholecystectomy with cholangiogram, and you will be scheduled for that surgery in the near future        Laparoscopic Cholecystectomy Laparoscopic cholecystectomy is surgery to remove the gallbladder. The gallbladder is located in the upper right part of the abdomen, behind the liver. It is a storage sac for bile produced in the liver. Bile aids in the digestion and absorption of fats. Cholecystectomy is often done for inflammation of the gallbladder (cholecystitis). This condition is usually caused by a buildup of gallstones (cholelithiasis) in your gallbladder. Gallstones can block the flow of bile, resulting in inflammation and pain. In severe cases, emergency surgery may be required. When emergency surgery is not required, you will have time to prepare for the procedure. Laparoscopic surgery is an alternative to open surgery. Laparoscopic surgery has a shorter recovery time. Your common bile duct may also need to be examined during the procedure. If stones are found in the common bile duct, they may be removed. LET [facility] CARE PROVIDER KNOW ABOUT:  Any allergies you have.  All medicines you are taking, including vitamins, herbs, eye drops, creams, and over-the-counter medicines.  Previous problems you or members of your family have had with the use of anesthetics.  Any blood disorders you have.  Previous surgeries you have had.  Medical conditions you have. RISKS AND COMPLICATIONS Generally, this is a safe procedure. However, as with any procedure, complications can occur. Possible complications include:  Infection.  Damage to the common bile duct, nerves, arteries, veins, or other internal organs such as the stomach,  liver, or intestines.  Bleeding.  A stone may remain in the common bile duct.  A bile leak from the cyst duct that is clipped when your gallbladder is removed.  The need to convert to open surgery, which requires a larger incision in the abdomen. This may be necessary if your surgeon thinks it is not safe to continue with a laparoscopic procedure. BEFORE THE PROCEDURE  Ask your health care provider about changing or stopping any regular medicines. You will need to stop taking aspirin or blood thinners at least 5 days prior to surgery.  Do not eat or drink anything after midnight the night before surgery.  Let your health care provider know if you develop a cold or other infectious problem before surgery. PROCEDURE   You will be given medicine to make you sleep through the procedure (general anesthetic). A breathing tube will be placed in your mouth.  When you are asleep, your surgeon will make several small cuts (incisions) in your abdomen.  A thin, lighted tube with a tiny camera on the end (laparoscope) is inserted through one of the small incisions. The camera on the laparoscope sends a picture to a TV screen in the operating room. This gives the surgeon a good view inside your abdomen.  A gas will be pumped into your abdomen. This expands your abdomen so that the surgeon has more room to perform the surgery.  Other tools needed for the procedure are inserted through the other incisions. The gallbladder is removed through one of the incisions.  After the removal of your gallbladder, the incisions will be closed with stitches, staples, or skin glue. AFTER  THE PROCEDURE  You will be taken to a recovery area where your progress will be checked often.  You may be allowed to go home the same day if your pain is controlled and you can tolerate liquids. Document Released: 12/15/2005 Document Revised: 10/05/2013 Document Reviewed: 07/27/2013 [facility] Patient Information 2015 [place],  [place]. This information is not intended to replace advice given to you by your health care provider. Make sure you discuss any questions you have with your health care provider.

## 2014-06-28 NOTE — Progress Notes (Signed)
Patient ID: Jenna [name], female   DOB: 05/02/1931, 80 y.o.   MRN: [id number]  Chief Complaint  Patient presents with  . Cholelithiasis    HPI Jenna [name] is a 80 y.o. female.  She is referred by Dr. Ashok Cordia in the emergency room for evaluation of symptomatic gallstones. Dr. Lujean Amel is her PCP.   She has never had a gallbladder attack before. On June 23 at 5 PM she developed right flank pain nausea and vomiting. She went to the emergency room. She had no more nausea or vomiting patient no diarrhea. Lab work was normal. Urinalysis was normal. CT scan showed several gallstones but no significant inflammatory change. No kidney stones. Arthritis. Mild diverticulosis. She has been asymptomatic since.  Comorbidities include type 2 diabetes, hypertension, hyperlipidemia, glaucoma, obesity. She had a robotic hysterectomy by Dr. Nancy Marus at [facility].  She denies history of cardiac disease, pulmonary disease, urologic problems. She denies alcohol intake.  She is asymptomatic today. Her husband and daughter her with her throughout the encounter. HPI  Past Medical History  Diagnosis Date  . Cancer     endometrial cancer  . Diabetes mellitus   . Arthritis   . Hypertension   . Hyperlipemia   . OAB (overactive bladder)     Past Surgical History  Procedure Laterality Date  . Rotator cuff repair      right arm  . Knee surgery      left knee  . Tubal ligation    . Eye surgery      cataract  . Joint replacement      knee  . Abdominal hysterectomy  02/2008    robotic hyst BSO Bilateral LND    Family History  Problem Relation Age of Onset  . Cancer Brother 67    rectal  . Early death Brother     Social History History  Substance Use Topics  . Smoking status: Never Smoker   . Smokeless tobacco: Never Used  . Alcohol Use: No    Allergies  Allergen Reactions  . Shrimp [Shellfish Allergy] Anaphylaxis  . Ancef [Cefazolin] Nausea And Vomiting    Current Outpatient  Prescriptions  Medication Sig Dispense Refill  . amLODipine (NORVASC) 10 MG tablet Take 10 mg by mouth daily.        Marland Kitchen aspirin 81 MG tablet Take 81 mg by mouth daily.      Marland Kitchen atorvastatin (LIPITOR) 20 MG tablet Take 20 mg by mouth daily.        . furosemide (LASIX) 20 MG tablet       . glipiZIDE (GLUCOTROL XL) 2.5 MG 24 hr tablet Take 2.5 mg by mouth daily.        . hydrochlorothiazide (HYDRODIURIL) 25 MG tablet       . ibuprofen (ADVIL,MOTRIN) 200 MG tablet Take 600 mg by mouth every 6 (six) hours as needed (pain).      Marland Kitchen latanoprost (XALATAN) 0.005 % ophthalmic solution Place 1 drop into both eyes at bedtime.       Marland Kitchen losartan (COZAAR) 100 MG tablet       . metFORMIN (GLUCOPHAGE) 500 MG tablet Take 500 mg by mouth daily.        . naproxen (NAPROSYN) 500 MG tablet Take 500 mg by mouth as needed (pain).        No current facility-administered medications for this visit.    Review of Systems Review of Systems  Constitutional: Negative for fever, chills and unexpected weight  change.  HENT: Negative for congestion, hearing loss, sore throat, trouble swallowing and voice change.   Eyes: Negative for visual disturbance.  Respiratory: Negative for cough and wheezing.   Cardiovascular: Negative for chest pain, palpitations and leg swelling.  Gastrointestinal: Positive for nausea, vomiting and abdominal pain. Negative for diarrhea, constipation, blood in stool, abdominal distention and anal bleeding.  Genitourinary: Negative for hematuria, vaginal bleeding and difficulty urinating.  Musculoskeletal: Positive for back pain. Negative for arthralgias.  Skin: Negative for rash and wound.  Neurological: Negative for seizures, syncope and headaches.  Hematological: Negative for adenopathy. Does not bruise/bleed easily.  Psychiatric/Behavioral: Negative for confusion.    Blood pressure 130/65, pulse 83, temperature 97.7 F (36.5 C), resp. rate 16, height 5' 0.5" (1.537 m), weight 233 lb 12.8 oz  (106.051 kg).  Physical Exam Physical Exam  Constitutional: She is oriented to person, place, and time. She appears well-developed and well-nourished. No distress.  Height 5 feet 5 inches. Weight 233 pounds.  HENT:  Head: Normocephalic and atraumatic.  Nose: Nose normal.  Mouth/Throat: No oropharyngeal exudate.  Eyes: Conjunctivae and EOM are normal. Pupils are equal, round, and reactive to light. Left eye exhibits no discharge. No scleral icterus.  Neck: Neck supple. No JVD present. No tracheal deviation present. No thyromegaly present.  Cardiovascular: Normal rate, regular rhythm, normal heart sounds and intact distal pulses.   No murmur heard. Pulmonary/Chest: Effort normal and breath sounds normal. No respiratory distress. She has no wheezes. She has no rales. She exhibits no tenderness.  Abdominal: Soft. Bowel sounds are normal. She exhibits no distension and no mass. There is no tenderness. There is no rebound and no guarding.  Soft. Nontender. Scattered trocar sites. Short infraumbilical incision. The umbilicus feels normal. No tenderness or mass and right upper quadrant.  Musculoskeletal: She exhibits no edema and no tenderness.  Lymphadenopathy:    She has no cervical adenopathy.  Neurological: She is alert and oriented to person, place, and time. She exhibits normal muscle tone. Coordination normal.  Skin: Skin is warm. No rash noted. She is not diaphoretic. No erythema. No pallor.  Psychiatric: She has a normal mood and affect. Her behavior is normal. Judgment and thought content normal.    Data Reviewed ER records, labs, CT scan.  Assessment    Chronic cholecystitis with cholelithiasis. It sounds like she had a good workup and this was most likely an attack of biliary colic. Although there is no emergency, in the long term she would benefit from cholecystectomy, and she agrees.  Obesity  Status post robotic hysterectomy, pathology unknown  Type 2  diabetes  Hypertension  Hyperlipidemia  Glaucoma     Plan    She would like to go ahead with surgery this summer, so she will be scheduled for laparoscopic cholecystectomy with cholangiogram, possible open.  I discussed the indications, details, techniques, and numerous risk of the surgery with her and her family. She is aware of the risk of bleeding, infection, bile leak, injury to adjacent organs with major reconstructive surgery, conversion to open laparotomy, wound hernia, and other unforeseen problems. She understands these issues well. At this time all of her questions are answered. She agrees with this plan.        Edsel Petrin. Dalbert Batman, M.D., [facility] General and Minimally invasive Surgery Breast and Colorectal Surgery Office:   [phone] Pager:   [phone]  06/28/2014, 3:26 PM

## 2014-07-03 ENCOUNTER — Observation Stay (HOSPITAL_COMMUNITY)
Admission: EM | Admit: 2014-07-03 | Discharge: 2014-07-05 | Disposition: A | Discharge status: Home / Self Care | Payer: Medicare Other | Attending: Surgery | Admitting: Surgery

## 2014-07-03 ENCOUNTER — Emergency Department (HOSPITAL_COMMUNITY): Payer: Medicare Other

## 2014-07-03 ENCOUNTER — Encounter (HOSPITAL_COMMUNITY): Payer: Self-pay | Admitting: Emergency Medicine

## 2014-07-03 DIAGNOSIS — IMO0002 Reserved for concepts with insufficient information to code with codable children: Secondary | ICD-10-CM | POA: Insufficient documentation

## 2014-07-03 DIAGNOSIS — E669 Obesity, unspecified: Secondary | ICD-10-CM | POA: Insufficient documentation

## 2014-07-03 DIAGNOSIS — Z79899 Other long term (current) drug therapy: Secondary | ICD-10-CM | POA: Diagnosis not present

## 2014-07-03 DIAGNOSIS — E785 Hyperlipidemia, unspecified: Secondary | ICD-10-CM | POA: Insufficient documentation

## 2014-07-03 DIAGNOSIS — C549 Malignant neoplasm of corpus uteri, unspecified: Secondary | ICD-10-CM | POA: Diagnosis not present

## 2014-07-03 DIAGNOSIS — Z96659 Presence of unspecified artificial knee joint: Secondary | ICD-10-CM | POA: Diagnosis not present

## 2014-07-03 DIAGNOSIS — I10 Essential (primary) hypertension: Secondary | ICD-10-CM | POA: Insufficient documentation

## 2014-07-03 DIAGNOSIS — M47817 Spondylosis without myelopathy or radiculopathy, lumbosacral region: Secondary | ICD-10-CM | POA: Insufficient documentation

## 2014-07-03 DIAGNOSIS — E119 Type 2 diabetes mellitus without complications: Secondary | ICD-10-CM | POA: Diagnosis not present

## 2014-07-03 DIAGNOSIS — N318 Other neuromuscular dysfunction of bladder: Secondary | ICD-10-CM | POA: Insufficient documentation

## 2014-07-03 DIAGNOSIS — E7800 Pure hypercholesterolemia, unspecified: Secondary | ICD-10-CM | POA: Insufficient documentation

## 2014-07-03 DIAGNOSIS — N3281 Overactive bladder: Secondary | ICD-10-CM | POA: Diagnosis present

## 2014-07-03 DIAGNOSIS — Z7982 Long term (current) use of aspirin: Secondary | ICD-10-CM | POA: Insufficient documentation

## 2014-07-03 DIAGNOSIS — Z90710 Acquired absence of both cervix and uterus: Secondary | ICD-10-CM | POA: Insufficient documentation

## 2014-07-03 DIAGNOSIS — K8010 Calculus of gallbladder with chronic cholecystitis without obstruction: Secondary | ICD-10-CM | POA: Diagnosis not present

## 2014-07-03 DIAGNOSIS — K8020 Calculus of gallbladder without cholecystitis without obstruction: Secondary | ICD-10-CM

## 2014-07-03 DIAGNOSIS — R1011 Right upper quadrant pain: Secondary | ICD-10-CM

## 2014-07-03 LAB — URINALYSIS, ROUTINE W REFLEX MICROSCOPIC
BILIRUBIN URINE: NEGATIVE
Glucose, UA: NEGATIVE mg/dL
HGB URINE DIPSTICK: NEGATIVE
KETONES UR: NEGATIVE mg/dL
LEUKOCYTES UA: NEGATIVE
NITRITE: NEGATIVE
PROTEIN: NEGATIVE mg/dL
Specific Gravity, Urine: 1.01 (ref 1.005–1.030)
Urobilinogen, UA: 0.2 mg/dL (ref 0.0–1.0)
pH: 6 (ref 5.0–8.0)

## 2014-07-03 LAB — CBC WITH DIFFERENTIAL/PLATELET
Basophils Absolute: 0 10*3/uL (ref 0.0–0.1)
Basophils Relative: 1 % (ref 0–1)
EOS ABS: 0.2 10*3/uL (ref 0.0–0.7)
Eosinophils Relative: 4 % (ref 0–5)
HEMATOCRIT: 35 % — AB (ref 36.0–46.0)
HEMOGLOBIN: 11.6 g/dL — AB (ref 12.0–15.0)
Lymphocytes Relative: 28 % (ref 12–46)
Lymphs Abs: 1.5 10*3/uL (ref 0.7–4.0)
MCH: 27.4 pg (ref 26.0–34.0)
MCHC: 33.1 g/dL (ref 30.0–36.0)
MCV: 82.5 fL (ref 78.0–100.0)
MONOS PCT: 10 % (ref 3–12)
Monocytes Absolute: 0.5 10*3/uL (ref 0.1–1.0)
Neutro Abs: 3 10*3/uL (ref 1.7–7.7)
Neutrophils Relative %: 57 % (ref 43–77)
Platelets: 283 10*3/uL (ref 150–400)
RBC: 4.24 MIL/uL (ref 3.87–5.11)
RDW: 13.8 % (ref 11.5–15.5)
WBC: 5.3 10*3/uL (ref 4.0–10.5)

## 2014-07-03 LAB — LIPASE, BLOOD: Lipase: 26 U/L (ref 11–59)

## 2014-07-03 LAB — GLUCOSE, CAPILLARY
GLUCOSE-CAPILLARY: 125 mg/dL — AB (ref 70–99)
Glucose-Capillary: 105 mg/dL — ABNORMAL HIGH (ref 70–99)

## 2014-07-03 LAB — COMPREHENSIVE METABOLIC PANEL
ALK PHOS: 97 U/L (ref 39–117)
ALT: 13 U/L (ref 0–35)
ANION GAP: 11 (ref 5–15)
AST: 15 U/L (ref 0–37)
Albumin: 3.4 g/dL — ABNORMAL LOW (ref 3.5–5.2)
BUN: 15 mg/dL (ref 6–23)
CO2: 29 mEq/L (ref 19–32)
Calcium: 9.6 mg/dL (ref 8.4–10.5)
Chloride: 102 mEq/L (ref 96–112)
Creatinine, Ser: 0.68 mg/dL (ref 0.50–1.10)
GFR calc non Af Amer: 80 mL/min — ABNORMAL LOW (ref >90)
GLUCOSE: 186 mg/dL — AB (ref 70–99)
Potassium: 4.1 mEq/L (ref 3.7–5.3)
Sodium: 142 mEq/L (ref 137–147)
TOTAL PROTEIN: 7.1 g/dL (ref 6.0–8.3)
Total Bilirubin: 0.2 mg/dL — ABNORMAL LOW (ref 0.3–1.2)

## 2014-07-03 LAB — SURGICAL PCR SCREEN
MRSA, PCR: NEGATIVE
Staphylococcus aureus: NEGATIVE

## 2014-07-03 MED ORDER — DIPHENHYDRAMINE HCL 50 MG/ML IJ SOLN: 12.5000 mg | Freq: Four times a day (QID) | INTRAMUSCULAR | Status: DC | PRN

## 2014-07-03 MED ORDER — OXYCODONE HCL 5 MG PO TABS: 5.0000 mg | ORAL_TABLET | ORAL | Status: DC | PRN

## 2014-07-03 MED ORDER — MORPHINE SULFATE 2 MG/ML IJ SOLN
2.0000 mg | Freq: Once | INTRAMUSCULAR | Status: AC
  Administered 2014-07-03: 2 mg via INTRAVENOUS
  Filled 2014-07-03: qty 1

## 2014-07-03 MED ORDER — CIPROFLOXACIN IN D5W 400 MG/200ML IV SOLN
400.0000 mg | INTRAVENOUS | Status: AC
Start: 2014-07-04 — End: 2014-07-04
  Administered 2014-07-04: 400 mg via INTRAVENOUS
  Filled 2014-07-03: qty 200

## 2014-07-03 MED ORDER — ACETAMINOPHEN 650 MG RE SUPP: 650.0000 mg | Freq: Four times a day (QID) | RECTAL | Status: DC | PRN

## 2014-07-03 MED ORDER — DOCUSATE SODIUM 100 MG PO CAPS
200.0000 mg | ORAL_CAPSULE | Freq: Every day | ORAL | Status: DC
  Administered 2014-07-03 – 2014-07-04 (×2): 200 mg via ORAL
  Filled 2014-07-03 (×3): qty 2

## 2014-07-03 MED ORDER — ONDANSETRON HCL 4 MG/2ML IJ SOLN
4.0000 mg | Freq: Four times a day (QID) | INTRAMUSCULAR | Status: DC | PRN
  Administered 2014-07-04: 4 mg via INTRAVENOUS
  Filled 2014-07-03: qty 2

## 2014-07-03 MED ORDER — ONDANSETRON HCL 4 MG/2ML IJ SOLN
4.0000 mg | Freq: Once | INTRAMUSCULAR | Status: AC
  Administered 2014-07-03: 4 mg via INTRAVENOUS
  Filled 2014-07-03: qty 2

## 2014-07-03 MED ORDER — HEPARIN SODIUM (PORCINE) 5000 UNIT/ML IJ SOLN
5000.0000 [IU] | Freq: Three times a day (TID) | INTRAMUSCULAR | Status: DC
  Filled 2014-07-03 (×2): qty 1

## 2014-07-03 MED ORDER — POTASSIUM CHLORIDE IN NACL 20-0.9 MEQ/L-% IV SOLN
INTRAVENOUS | Status: DC
  Administered 2014-07-03 – 2014-07-04 (×2): via INTRAVENOUS
  Filled 2014-07-03 (×4): qty 1000

## 2014-07-03 MED ORDER — PSYLLIUM 95 % PO PACK
1.0000 | PACK | Freq: Every day | ORAL | Status: DC
  Administered 2014-07-03 – 2014-07-04 (×2): 1 via ORAL
  Filled 2014-07-03 (×3): qty 1

## 2014-07-03 MED ORDER — CIPROFLOXACIN IN D5W 400 MG/200ML IV SOLN: 400.0000 mg | INTRAVENOUS | Status: DC

## 2014-07-03 MED ORDER — MORPHINE SULFATE 2 MG/ML IJ SOLN
2.0000 mg | INTRAMUSCULAR | Status: DC | PRN
  Administered 2014-07-04: 2 mg via INTRAVENOUS
  Filled 2014-07-03: qty 1

## 2014-07-03 MED ORDER — ACETAMINOPHEN 325 MG PO TABS
650.0000 mg | ORAL_TABLET | Freq: Four times a day (QID) | ORAL | Status: DC | PRN
  Administered 2014-07-04: 650 mg via ORAL
  Filled 2014-07-03: qty 2

## 2014-07-03 MED ORDER — HEPARIN SODIUM (PORCINE) 5000 UNIT/ML IJ SOLN
5000.0000 [IU] | Freq: Once | INTRAMUSCULAR | Status: AC
Start: 2014-07-03 — End: 2014-07-03
  Administered 2014-07-03: 5000 [IU] via SUBCUTANEOUS
  Filled 2014-07-03: qty 1

## 2014-07-03 MED ORDER — DIPHENHYDRAMINE HCL 12.5 MG/5ML PO ELIX: 12.5000 mg | ORAL_SOLUTION | Freq: Four times a day (QID) | ORAL | Status: DC | PRN

## 2014-07-03 MED ORDER — CHLORHEXIDINE GLUCONATE 4 % EX LIQD
1.0000 "application " | Freq: Once | CUTANEOUS | Status: AC
  Administered 2014-07-04: 1 via TOPICAL
  Filled 2014-07-03: qty 15

## 2014-07-03 NOTE — ED Provider Notes (Signed)
CSN: [id number]     Arrival date & time 07/03/14  1225 History   First MD Initiated Contact with Patient 07/03/14 1305     Chief Complaint  Patient presents with  . Abdominal Pain     (Consider location/radiation/quality/duration/timing/severity/associated sxs/prior Treatment) HPI Comments: Patient with a history of recently diagnosed Cholelithiasis presents with a chief complaint of RUQ abdominal pain.  She reports that the pain was intermittent yesterday.  However, the pain became constant this morning after eating grits and toast.  Pain does not radiate. She reports that she took two Aleve for the pain without relief.  Pain associated with nausea, but no vomiting.  She reports that the pain feels similar to the pain that she has had from her Gallbladder in the past.  She denies chest pain, SOB, cough, fever, chills, or diarrhea.  Patient recently saw Dr. Dalbert Batman with [facility] in the office on 06/28/14.  Patient is scheduled to have a Lap Cholecystectomy at some point this summer, but does not have a specific date yet.   The history is provided by the patient.    Past Medical History  Diagnosis Date  . Cancer     endometrial cancer  . Diabetes mellitus   . Arthritis   . Hypertension   . Hyperlipemia   . OAB (overactive bladder)    Past Surgical History  Procedure Laterality Date  . Rotator cuff repair      right arm  . Knee surgery      left knee  . Tubal ligation    . Eye surgery      cataract  . Joint replacement      knee  . Abdominal hysterectomy  02/2008    robotic hyst BSO Bilateral LND   Family History  Problem Relation Age of Onset  . Cancer Brother 56    rectal  . Early death Brother    History  Substance Use Topics  . Smoking status: Never Smoker   . Smokeless tobacco: Never Used  . Alcohol Use: No   OB History   Grav Para Term Preterm Abortions TAB SAB Ect Mult Living   7 7 7             Review of Systems  Gastrointestinal: Positive for  nausea and abdominal pain.  All other systems reviewed and are negative.     Allergies  Shrimp and Ancef  Home Medications   Prior to Admission medications   Medication Sig Start Date End Date Taking? Authorizing Provider  amLODipine (NORVASC) 10 MG tablet Take 10 mg by mouth daily.     Yes Historical Provider, MD  aspirin 81 MG tablet Take 81 mg by mouth daily.   Yes Historical Provider, MD  atorvastatin (LIPITOR) 20 MG tablet Take 20 mg by mouth daily.     Yes Historical Provider, MD  furosemide (LASIX) 20 MG tablet Take 20 mg by mouth daily as needed for fluid.  05/06/14  Yes Historical Provider, MD  glipiZIDE (GLUCOTROL XL) 2.5 MG 24 hr tablet Take 2.5 mg by mouth daily.     Yes Historical Provider, MD  hydrochlorothiazide (HYDRODIURIL) 25 MG tablet Take 25 mg by mouth daily.  05/16/14  Yes Historical Provider, MD  HYDROcodone-acetaminophen (NORCO/VICODIN) 5-325 MG per tablet Take 1 tablet by mouth every 4 (four) hours as needed for moderate pain.   Yes Historical Provider, MD  ibuprofen (ADVIL,MOTRIN) 200 MG tablet Take 600 mg by mouth every 6 (six) hours as needed (  pain).   Yes Historical Provider, MD  latanoprost (XALATAN) 0.005 % ophthalmic solution Place 1 drop into both eyes at bedtime.    Yes Historical Provider, MD  losartan (COZAAR) 100 MG tablet Take 100 mg by mouth daily.  05/16/14  Yes Historical Provider, MD   BP 140/54  Pulse 61  Temp(Src) 97.8 F (36.6 C) (Oral)  Resp 18  SpO2 100% Physical Exam  Nursing note and vitals reviewed. Constitutional: She appears well-developed and well-nourished.  HENT:  Head: Normocephalic and atraumatic.  Mouth/Throat: Oropharynx is clear and moist.  Neck: Normal range of motion. Neck supple.  Cardiovascular: Normal rate, regular rhythm and normal heart sounds.   Pulmonary/Chest: Effort normal and breath sounds normal.  Abdominal: Soft. Normal appearance and bowel sounds are normal. She exhibits no distension and no mass. There is  tenderness in the right upper quadrant. There is no rigidity, no rebound, no guarding and negative Murphy's sign.  Neurological: She is alert.  Skin: Skin is warm and dry.  Psychiatric: She has a normal mood and affect.    ED Course  Procedures (including critical care time) Labs Review Labs Reviewed  CBC WITH DIFFERENTIAL  COMPREHENSIVE METABOLIC PANEL  LIPASE, BLOOD  URINALYSIS, ROUTINE W REFLEX MICROSCOPIC    Imaging Review No results found.   EKG Interpretation None     2:13 PM Discussed with General Surgery.  They report that they will come see the patient in the ED. 2:45 PM Patient reports that her pain has improved at this time. MDM   Final diagnoses:  None  Patient with recently diagnosed Cholelithiasis presents today with RUQ abdominal pain.  Patient was seen in the office by [facility] five days ago and is scheduled to have a Lap Chole at some point to this summer.  Labs today unremarkable.  Patient discussed with [facility] who evaluated the patient in the ED.  Patient admitted by Surgery for Lap Cholecystectomy.      Hyman Bible, PA-C 07/04/14 2134

## 2014-07-03 NOTE — H&P (Signed)
Patient having more frequent attacks suspicious for acute on chronic cholecystitis.  Plan admission, IV antibiotics, cholecystectomy

## 2014-07-03 NOTE — H&P (Signed)
Reason for Consult:  RUQ pain with cholelithiasis Referring Physician:  Hyman Bible PA-C  (ED)  Jenna [name] is an 80 y.o. female.   HPI: This is an 80 y/o female who presented to ED with RUQ pain on 06/20/14.  CT scan showed 3, 2 cm gallstones, CBC/LFT's were normal and she was set up to see Dr. Dalbert Batman on 06/28/14.  They agreed she would benefit from surgery and this is being scheduled, but at this point a date has not been found.  She has been at home and started having some discomfort yesterday, but was able to eat a regular supper and sleep last PM with just an Aleve for pain.  She has pain medicine but does not like to take it for fear of constipation.  Today when she got up the pain in the RUQ was much worse. She ate some grits for breakfast and symptoms became worse.  She had some nausea and diarrhea, but no vomiting.  She presented to the ED and after treatment with morphine her pain has resolved. Her work up shows she is afebrile, VSS, glucose is up but LFT'S, LIPASE and WBC are normal.  At his point Dr. Dalbert Batman cannot do her until after 7/21.    Past Medical History   Diagnosis  Date   .  Cancer         endometrial cancer  : [facility]: stage 1B EM cancer 02/2008 s/p robotic hyst with BSO       .  Diabetes mellitus     .  Arthritis     .  Hypertension     .  Hyperlipemia     .  OAB (overactive bladder)  Obesity        Past Surgical History   Procedure  Laterality  Date   .  Rotator cuff repair           right arm   .  Knee surgery           left knee   .  Tubal ligation       .  Eye surgery           cataract   .  Joint replacement           knee   .  Abdominal hysterectomy    02/2008       robotic hyst BSO Bilateral LND This was for endometrial cancer       Family History   Problem  Relation  Age of Onset   .  Cancer  Brother  46       rectal   .  Early death  Brother       Social History: reports that she has never smoked. She has never used smokeless tobacco.  She reports that she does not drink alcohol or use illicit drugs.  Allergies:   Allergies   Allergen  Reactions   .  Shrimp [Shellfish Allergy]  Anaphylaxis   .  Ancef [Cefazolin]  Nausea And Vomiting    Prior to Admission medications    Medication  Sig  Start Date  End Date  Taking?  Authorizing Provider   amLODipine (NORVASC) 10 MG tablet  Take 10 mg by mouth daily.        Yes  Historical Provider, MD   aspirin 81 MG tablet  Take 81 mg by mouth daily.      Yes  Historical Provider, MD   atorvastatin (LIPITOR) 20 MG  tablet  Take 20 mg by mouth daily.        Yes  Historical Provider, MD   furosemide (LASIX) 20 MG tablet  Take 20 mg by mouth daily as needed for fluid.   05/06/14    Yes  Historical Provider, MD   glipiZIDE (GLUCOTROL XL) 2.5 MG 24 hr tablet  Take 2.5 mg by mouth daily.        Yes  Historical Provider, MD   hydrochlorothiazide (HYDRODIURIL) 25 MG tablet  Take 25 mg by mouth daily.   05/16/14    Yes  Historical Provider, MD   HYDROcodone-acetaminophen (NORCO/VICODIN) 5-325 MG per tablet  Take 1 tablet by mouth every 4 (four) hours as needed for moderate pain.      Yes  Historical Provider, MD   ibuprofen (ADVIL,MOTRIN) 200 MG tablet  Take 600 mg by mouth every 6 (six) hours as needed (pain).      Yes  Historical Provider, MD   latanoprost (XALATAN) 0.005 % ophthalmic solution  Place 1 drop into both eyes at bedtime.       Yes  Historical Provider, MD   losartan (COZAAR) 100 MG tablet  Take 100 mg by mouth daily.   05/16/14    Yes  Historical Provider, MD        Anti-infectives     None          Results for orders placed during the hospital encounter of 07/03/14 (from the past 48 hour(s))   CBC WITH DIFFERENTIAL     Status: Abnormal     Collection Time      07/03/14  1:27 PM       Result  Value  Ref Range     WBC  5.3   4.0 - 10.5 K/uL     RBC  4.24   3.87 - 5.11 MIL/uL     Hemoglobin  11.6 (*)  12.0 - 15.0 g/dL     HCT  35.0 (*)  36.0 - 46.0 %     MCV  82.5   78.0 -  100.0 fL     MCH  27.4   26.0 - 34.0 pg     MCHC  33.1   30.0 - 36.0 g/dL     RDW  13.8   11.5 - 15.5 %     Platelets  283   150 - 400 K/uL     Neutrophils Relative %  57   43 - 77 %     Neutro Abs  3.0   1.7 - 7.7 K/uL     Lymphocytes Relative  28   12 - 46 %     Lymphs Abs  1.5   0.7 - 4.0 K/uL     Monocytes Relative  10   3 - 12 %     Monocytes Absolute  0.5   0.1 - 1.0 K/uL     Eosinophils Relative  4   0 - 5 %     Eosinophils Absolute  0.2   0.0 - 0.7 K/uL     Basophils Relative  1   0 - 1 %     Basophils Absolute  0.0   0.0 - 0.1 K/uL   COMPREHENSIVE METABOLIC PANEL     Status: Abnormal     Collection Time      07/03/14  1:27 PM       Result  Value  Ref Range     Sodium  142  137 - 147 mEq/L     Potassium  4.1   3.7 - 5.3 mEq/L     Chloride  102   96 - 112 mEq/L     CO2  29   19 - 32 mEq/L     Glucose, Bld  186 (*)  70 - 99 mg/dL     BUN  15   6 - 23 mg/dL     Creatinine, Ser  0.68   0.50 - 1.10 mg/dL     Calcium  9.6   8.4 - 10.5 mg/dL     Total Protein  7.1   6.0 - 8.3 g/dL     Albumin  3.4 (*)  3.5 - 5.2 g/dL     AST  15   0 - 37 U/L     ALT  13   0 - 35 U/L     Alkaline Phosphatase  97   39 - 117 U/L     Total Bilirubin  0.2 (*)  0.3 - 1.2 mg/dL     GFR calc non Af Amer  80 (*)  >90 mL/min     GFR calc Af Amer  >90   >90 mL/min     Comment:  (NOTE)        The eGFR has been calculated using the CKD EPI equation.        This calculation has not been validated in all clinical situations.        eGFR's persistently <90 mL/min signify possible Chronic Kidney        Disease.     Anion gap  11   5 - 15   LIPASE, BLOOD     Status: None     Collection Time      07/03/14  1:27 PM       Result  Value  Ref Range     Lipase  26   11 - 59 U/L   URINALYSIS, ROUTINE W REFLEX MICROSCOPIC     Status: None     Collection Time      07/03/14  1:34 PM       Result  Value  Ref Range     Color, Urine  YELLOW   YELLOW     APPearance  CLEAR   CLEAR     Specific Gravity, Urine   1.010   1.005 - 1.030     pH  6.0   5.0 - 8.0     Glucose, UA  NEGATIVE   NEGATIVE mg/dL     Hgb urine dipstick  NEGATIVE   NEGATIVE     Bilirubin Urine  NEGATIVE   NEGATIVE     Ketones, ur  NEGATIVE   NEGATIVE mg/dL     Protein, ur  NEGATIVE   NEGATIVE mg/dL     Urobilinogen, UA  0.2   0.0 - 1.0 mg/dL     Nitrite  NEGATIVE   NEGATIVE     Leukocytes, UA  NEGATIVE   NEGATIVE     Comment:  MICROSCOPIC NOT DONE ON URINES WITH NEGATIVE PROTEIN, BLOOD, LEUKOCYTES, NITRITE, OR GLUCOSE <1000 mg/dL.     No results found.  Review of Systems  Constitutional: Negative.   HENT: Negative.   Eyes: Negative.   Respiratory: Negative.   Cardiovascular: Positive for leg swelling.  Gastrointestinal: Positive for nausea, abdominal pain (RUQ) and diarrhea (some this AM). Negative for heartburn, vomiting, constipation, blood in stool and melena.  Genitourinary: Negative.  Says she has to void to frequently now and needs a bladder tuck.  Musculoskeletal: Positive for joint pain.       Some arthritis mostly hips and legs  Skin: Negative.   Neurological: Negative.   Endo/Heme/Allergies: Negative.   Psychiatric/Behavioral: Negative.    Blood pressure 140/54, pulse 61, temperature 97.8 F (36.6 C), temperature source Oral, resp. rate 18, SpO2 100.00%. Physical Exam  Constitutional: She is oriented to person, place, and time. She appears well-developed and well-nourished. No distress.  Over weight female, no distress.  HENT:   Head: Normocephalic and atraumatic.   Nose: Nose normal.  Eyes: Conjunctivae and EOM are normal. Pupils are equal, round, and reactive to light. Right eye exhibits no discharge. Left eye exhibits no discharge. No scleral icterus.  Neck: Normal range of motion. Neck supple. No JVD present. No tracheal deviation present. No thyromegaly present.  Cardiovascular: Normal rate, regular rhythm, normal heart sounds and intact distal pulses.  Exam reveals no gallop.    No murmur  heard. Respiratory: Effort normal and breath sounds normal. No respiratory distress. She has no wheezes. She has no rales. She exhibits no tenderness.  GI: Soft. Bowel sounds are normal. She exhibits no distension and no mass. There is no tenderness. There is no rebound and no guarding.  Musculoskeletal: She exhibits no edema and no tenderness.  Lymphadenopathy:    She has no cervical adenopathy.  Neurological: She is alert and oriented to person, place, and time. No cranial nerve deficit.  Skin: Skin is warm and dry. No rash noted. She is not diaphoretic. No erythema. No pallor.  Psychiatric: She has a normal mood and affect. Her behavior is normal. Judgment and thought content normal.    Assessment/Plan: 1.  Recurrent RUQ post prandial pain with cholelithiasis. 2.  Hx of endometrial cancer with hysterectomy; Left inguinal           lymph node/venous varix on CT scan. 3.  Arthritis with Multilevel lumbar spondylosis and    degenerative disc disease. 4.  Hypertension 5.  AODM, on oral medications 6.  Hyperlipidemia 7.  Over active bladder 8.  Obesity   Plan:  Our office cannot schedule her sooner than 07/18/14, so we will admit her and plan surgery tomorrow.  I will recheck LFT'S in AM.  Leonidas Boateng 07/03/2014, 2:40 PM

## 2014-07-03 NOTE — ED Notes (Signed)
US at bedside

## 2014-07-03 NOTE — ED Notes (Signed)
Pt presents to ed with c/o abdominal pain, sts has gallstones and is being followed by [facility] for possible surgery. Pt sts pain started yesterday and got progressively worse today. Pt also reports diarrhea. Pt sts she has pain meds at home, but is reluctant to take any because she is scared of being constipated.

## 2014-07-04 ENCOUNTER — Encounter (HOSPITAL_COMMUNITY): Payer: Medicare Other | Admitting: Certified Registered Nurse Anesthetist

## 2014-07-04 ENCOUNTER — Observation Stay (HOSPITAL_COMMUNITY): Payer: Medicare Other | Admitting: Certified Registered Nurse Anesthetist

## 2014-07-04 ENCOUNTER — Encounter (HOSPITAL_COMMUNITY)
Admission: EM | Disposition: A | Discharge status: Home / Self Care | Payer: Self-pay | Source: Home / Self Care | Attending: Emergency Medicine

## 2014-07-04 ENCOUNTER — Encounter (HOSPITAL_COMMUNITY): Payer: Self-pay

## 2014-07-04 ENCOUNTER — Observation Stay (HOSPITAL_COMMUNITY): Payer: Medicare Other

## 2014-07-04 DIAGNOSIS — M47817 Spondylosis without myelopathy or radiculopathy, lumbosacral region: Secondary | ICD-10-CM | POA: Diagnosis not present

## 2014-07-04 DIAGNOSIS — K8010 Calculus of gallbladder with chronic cholecystitis without obstruction: Secondary | ICD-10-CM

## 2014-07-04 DIAGNOSIS — M1990 Unspecified osteoarthritis, unspecified site: Secondary | ICD-10-CM | POA: Diagnosis not present

## 2014-07-04 DIAGNOSIS — I10 Essential (primary) hypertension: Secondary | ICD-10-CM | POA: Diagnosis not present

## 2014-07-04 DIAGNOSIS — R1011 Right upper quadrant pain: Secondary | ICD-10-CM | POA: Diagnosis not present

## 2014-07-04 DIAGNOSIS — IMO0002 Reserved for concepts with insufficient information to code with codable children: Secondary | ICD-10-CM | POA: Diagnosis not present

## 2014-07-04 DIAGNOSIS — K8020 Calculus of gallbladder without cholecystitis without obstruction: Secondary | ICD-10-CM | POA: Diagnosis not present

## 2014-07-04 HISTORY — PX: LAPAROSCOPIC CHOLECYSTECTOMY SINGLE PORT: SHX5891

## 2014-07-04 LAB — COMPREHENSIVE METABOLIC PANEL
ALT: 11 U/L (ref 0–35)
AST: 13 U/L (ref 0–37)
Albumin: 3.1 g/dL — ABNORMAL LOW (ref 3.5–5.2)
Alkaline Phosphatase: 87 U/L (ref 39–117)
Anion gap: 10 (ref 5–15)
BUN: 9 mg/dL (ref 6–23)
CHLORIDE: 104 meq/L (ref 96–112)
CO2: 28 meq/L (ref 19–32)
Calcium: 8.9 mg/dL (ref 8.4–10.5)
Creatinine, Ser: 0.67 mg/dL (ref 0.50–1.10)
GFR calc Af Amer: >90 mL/min (ref >90)
GFR, EST NON AFRICAN AMERICAN: 81 mL/min — AB (ref >90)
Glucose, Bld: 121 mg/dL — ABNORMAL HIGH (ref 70–99)
Potassium: 4.2 mEq/L (ref 3.7–5.3)
Sodium: 142 mEq/L (ref 137–147)
Total Bilirubin: 0.2 mg/dL — ABNORMAL LOW (ref 0.3–1.2)
Total Protein: 6.2 g/dL (ref 6.0–8.3)

## 2014-07-04 LAB — GLUCOSE, CAPILLARY
GLUCOSE-CAPILLARY: 130 mg/dL — AB (ref 70–99)
GLUCOSE-CAPILLARY: 176 mg/dL — AB (ref 70–99)
Glucose-Capillary: 181 mg/dL — ABNORMAL HIGH (ref 70–99)

## 2014-07-04 LAB — CBC
HEMATOCRIT: 34.8 % — AB (ref 36.0–46.0)
Hemoglobin: 11.1 g/dL — ABNORMAL LOW (ref 12.0–15.0)
MCH: 27.1 pg (ref 26.0–34.0)
MCHC: 31.9 g/dL (ref 30.0–36.0)
MCV: 84.9 fL (ref 78.0–100.0)
PLATELETS: 287 10*3/uL (ref 150–400)
RBC: 4.1 MIL/uL (ref 3.87–5.11)
RDW: 14.2 % (ref 11.5–15.5)
WBC: 5.1 10*3/uL (ref 4.0–10.5)

## 2014-07-04 LAB — LIPASE, BLOOD: Lipase: 43 U/L (ref 11–59)

## 2014-07-04 SURGERY — LAPAROSCOPIC CHOLECYSTECTOMY SINGLE SITE
Anesthesia: General | Site: Abdomen

## 2014-07-04 MED ORDER — HYDROCODONE-ACETAMINOPHEN 5-325 MG PO TABS: 1.0000 | ORAL_TABLET | ORAL | Status: DC | PRN

## 2014-07-04 MED ORDER — PROPOFOL 10 MG/ML IV BOLUS
INTRAVENOUS | Status: AC
  Filled 2014-07-04: qty 20

## 2014-07-04 MED ORDER — SUCCINYLCHOLINE CHLORIDE 20 MG/ML IJ SOLN
INTRAMUSCULAR | Status: DC | PRN
  Administered 2014-07-04: 100 mg via INTRAVENOUS

## 2014-07-04 MED ORDER — CISATRACURIUM BESYLATE (PF) 10 MG/5ML IV SOLN
INTRAVENOUS | Status: DC | PRN
  Administered 2014-07-04: 8 mg via INTRAVENOUS

## 2014-07-04 MED ORDER — LACTATED RINGERS IR SOLN
Status: DC | PRN
  Administered 2014-07-04: 1000 mL

## 2014-07-04 MED ORDER — MIDAZOLAM HCL 2 MG/2ML IJ SOLN
INTRAMUSCULAR | Status: AC
  Filled 2014-07-04: qty 2

## 2014-07-04 MED ORDER — HYDROMORPHONE HCL PF 1 MG/ML IJ SOLN
INTRAMUSCULAR | Status: AC
  Filled 2014-07-04: qty 1

## 2014-07-04 MED ORDER — LIDOCAINE HCL (CARDIAC) 20 MG/ML IV SOLN
INTRAVENOUS | Status: DC | PRN
  Administered 2014-07-04: 75 mg via INTRAVENOUS

## 2014-07-04 MED ORDER — CISATRACURIUM BESYLATE 20 MG/10ML IV SOLN
INTRAVENOUS | Status: AC
  Filled 2014-07-04: qty 10

## 2014-07-04 MED ORDER — FENTANYL CITRATE 0.05 MG/ML IJ SOLN
INTRAMUSCULAR | Status: AC
  Filled 2014-07-04: qty 5

## 2014-07-04 MED ORDER — PROMETHAZINE HCL 25 MG/ML IJ SOLN
6.2500 mg | Freq: Four times a day (QID) | INTRAMUSCULAR | Status: DC | PRN
Start: 2014-07-04 — End: 2014-07-05
  Administered 2014-07-04: 6.25 mg via INTRAVENOUS
  Filled 2014-07-04: qty 1

## 2014-07-04 MED ORDER — NEOSTIGMINE METHYLSULFATE 10 MG/10ML IV SOLN: INTRAVENOUS | Status: DC | PRN

## 2014-07-04 MED ORDER — NEOSTIGMINE METHYLSULFATE 10 MG/10ML IV SOLN
INTRAVENOUS | Status: DC | PRN
  Administered 2014-07-04: 4 mg via INTRAVENOUS

## 2014-07-04 MED ORDER — LIDOCAINE HCL (CARDIAC) 20 MG/ML IV SOLN
INTRAVENOUS | Status: AC
  Filled 2014-07-04: qty 5

## 2014-07-04 MED ORDER — ONDANSETRON HCL 4 MG/2ML IJ SOLN
INTRAMUSCULAR | Status: AC
  Filled 2014-07-04: qty 2

## 2014-07-04 MED ORDER — MEPERIDINE HCL 50 MG/ML IJ SOLN: 6.2500 mg | INTRAMUSCULAR | Status: DC | PRN

## 2014-07-04 MED ORDER — HYDROMORPHONE HCL PF 1 MG/ML IJ SOLN
0.2500 mg | INTRAMUSCULAR | Status: DC | PRN
  Administered 2014-07-04 (×2): 0.5 mg via INTRAVENOUS

## 2014-07-04 MED ORDER — PROPOFOL 10 MG/ML IV BOLUS
INTRAVENOUS | Status: DC | PRN
  Administered 2014-07-04: 20 mg via INTRAVENOUS
  Administered 2014-07-04: 175 mg via INTRAVENOUS

## 2014-07-04 MED ORDER — ACETAMINOPHEN 10 MG/ML IV SOLN
1000.0000 mg | Freq: Once | INTRAVENOUS | Status: AC
  Administered 2014-07-04: 1000 mg via INTRAVENOUS
  Filled 2014-07-04 (×2): qty 100

## 2014-07-04 MED ORDER — GLYCOPYRROLATE 0.2 MG/ML IJ SOLN
INTRAMUSCULAR | Status: AC
  Filled 2014-07-04: qty 3

## 2014-07-04 MED ORDER — NEOSTIGMINE METHYLSULFATE 10 MG/10ML IV SOLN
INTRAVENOUS | Status: AC
  Filled 2014-07-04: qty 1

## 2014-07-04 MED ORDER — EPHEDRINE SULFATE 50 MG/ML IJ SOLN
INTRAMUSCULAR | Status: DC | PRN
  Administered 2014-07-04: 10 mg via INTRAVENOUS

## 2014-07-04 MED ORDER — GLYCOPYRROLATE 0.2 MG/ML IJ SOLN
INTRAMUSCULAR | Status: DC | PRN
  Administered 2014-07-04: 0.6 mg via INTRAVENOUS

## 2014-07-04 MED ORDER — OXYCODONE HCL 5 MG/5ML PO SOLN: 5.0000 mg | Freq: Once | ORAL | Status: DC | PRN

## 2014-07-04 MED ORDER — DEXAMETHASONE SODIUM PHOSPHATE 10 MG/ML IJ SOLN
INTRAMUSCULAR | Status: AC
  Filled 2014-07-04: qty 1

## 2014-07-04 MED ORDER — CISATRACURIUM BESYLATE (PF) 10 MG/5ML IV SOLN: INTRAVENOUS | Status: DC | PRN

## 2014-07-04 MED ORDER — LACTATED RINGERS IV SOLN
INTRAVENOUS | Status: DC
Start: 2014-07-04 — End: 2014-07-04
  Administered 2014-07-04: 1000 mL via INTRAVENOUS

## 2014-07-04 MED ORDER — LABETALOL HCL 5 MG/ML IV SOLN
INTRAVENOUS | Status: DC | PRN
  Administered 2014-07-04: 2.5 mg via INTRAVENOUS

## 2014-07-04 MED ORDER — OXYCODONE HCL 5 MG PO TABS: 5.0000 mg | ORAL_TABLET | Freq: Once | ORAL | Status: DC | PRN

## 2014-07-04 MED ORDER — IBUPROFEN 600 MG PO TABS: 600.0000 mg | ORAL_TABLET | Freq: Four times a day (QID) | ORAL | Status: DC | PRN

## 2014-07-04 MED ORDER — PROMETHAZINE HCL 25 MG/ML IJ SOLN: 6.2500 mg | INTRAMUSCULAR | Status: DC | PRN

## 2014-07-04 MED ORDER — BUPIVACAINE-EPINEPHRINE (PF) 0.25% -1:200000 IJ SOLN
INTRAMUSCULAR | Status: AC
  Filled 2014-07-04: qty 60

## 2014-07-04 MED ORDER — ONDANSETRON HCL 4 MG/2ML IJ SOLN
INTRAMUSCULAR | Status: DC | PRN
  Administered 2014-07-04: 4 mg via INTRAVENOUS

## 2014-07-04 MED ORDER — LABETALOL HCL 5 MG/ML IV SOLN
INTRAVENOUS | Status: AC
  Filled 2014-07-04: qty 4

## 2014-07-04 MED ORDER — FENTANYL CITRATE 0.05 MG/ML IJ SOLN
INTRAMUSCULAR | Status: DC | PRN
  Administered 2014-07-04 (×3): 50 ug via INTRAVENOUS
  Administered 2014-07-04: 100 ug via INTRAVENOUS

## 2014-07-04 MED ORDER — LACTATED RINGERS IV SOLN
INTRAVENOUS | Status: DC | PRN
  Administered 2014-07-04 (×2): via INTRAVENOUS

## 2014-07-04 SURGICAL SUPPLY — 40 items
APPLIER CLIP 5 13 M/L LIGAMAX5 (MISCELLANEOUS)
APPLIER CLIP ROT 10 11.4 M/L (STAPLE)
APR CLP MED LRG 11.4X10 (STAPLE)
APR CLP MED LRG 5 ANG JAW (MISCELLANEOUS)
BAG SPEC RTRVL LRG 6X4 10 (ENDOMECHANICALS) ×2
CABLE HIGH FREQUENCY MONO STRZ (ELECTRODE) IMPLANT
CANISTER SUCTION 2500CC (MISCELLANEOUS) ×1 IMPLANT
CLIP APPLIE 5 13 M/L LIGAMAX5 (MISCELLANEOUS) IMPLANT
CLIP APPLIE ROT 10 11.4 M/L (STAPLE) IMPLANT
COVER MAYO STAND STRL (DRAPES) ×4 IMPLANT
DECANTER SPIKE VIAL GLASS SM (MISCELLANEOUS) ×4 IMPLANT
DRAPE C-ARM 42X120 X-RAY (DRAPES) ×4 IMPLANT
DRAPE LAPAROSCOPIC ABDOMINAL (DRAPES) ×4 IMPLANT
DRAPE UTILITY XL STRL (DRAPES) ×4 IMPLANT
DRAPE WARM FLUID 44X44 (DRAPE) ×4 IMPLANT
DRSG TEGADERM 2-3/8X2-3/4 SM (GAUZE/BANDAGES/DRESSINGS) ×3 IMPLANT
DRSG TEGADERM 4X4.75 (GAUZE/BANDAGES/DRESSINGS) ×4 IMPLANT
ELECT REM PT RETURN 9FT ADLT (ELECTROSURGICAL) ×4
ELECTRODE REM PT RTRN 9FT ADLT (ELECTROSURGICAL) ×2 IMPLANT
ENDOLOOP SUT PDS II  0 18 (SUTURE) ×2
ENDOLOOP SUT PDS II 0 18 (SUTURE) ×1 IMPLANT
GLOVE ECLIPSE 8.0 STRL XLNG CF (GLOVE) ×4 IMPLANT
GLOVE INDICATOR 8.0 STRL GRN (GLOVE) ×4 IMPLANT
GOWN STRL REUS W/TWL XL LVL3 (GOWN DISPOSABLE) ×8 IMPLANT
KIT BASIN OR (CUSTOM PROCEDURE TRAY) ×4 IMPLANT
MANIFOLD NEPTUNE II (INSTRUMENTS) ×3 IMPLANT
POUCH SPECIMEN RETRIEVAL 10MM (ENDOMECHANICALS) ×3 IMPLANT
SCISSORS LAP 5X35 DISP (ENDOMECHANICALS) ×4 IMPLANT
SET CHOLANGIOGRAPH MIX (MISCELLANEOUS) ×4 IMPLANT
SET IRRIG TUBING LAPAROSCOPIC (IRRIGATION / IRRIGATOR) ×4 IMPLANT
SLEEVE XCEL OPT CAN 5 100 (ENDOMECHANICALS) IMPLANT
SUT MNCRL AB 4-0 PS2 18 (SUTURE) ×4 IMPLANT
SUT PDS AB 0 CT1 36 (SUTURE) ×3 IMPLANT
SYRINGE 20CC LL (MISCELLANEOUS) ×4 IMPLANT
TOWEL OR 17X26 10 PK STRL BLUE (TOWEL DISPOSABLE) ×4 IMPLANT
TOWEL OR NON WOVEN STRL DISP B (DISPOSABLE) ×4 IMPLANT
TRAY LAP CHOLE (CUSTOM PROCEDURE TRAY) ×4 IMPLANT
TROCAR BLADELESS OPT 5 100 (ENDOMECHANICALS) ×4 IMPLANT
TROCAR XCEL NON-BLD 11X100MML (ENDOMECHANICALS) ×4 IMPLANT
TUBING INSUFFLATION 10FT LAP (TUBING) ×4 IMPLANT

## 2014-07-04 NOTE — Progress Notes (Signed)
[facility]  [address] [place], [place] [address] Phone: [phone] FAX: [place] [phone] 12-04-1931  CARE TEAM:  PCP: Lujean Amel, MD  Outpatient Care Team: Patient Care Team: Dibas [name], MD as PCP - General (Family Medicine)  Inpatient Treatment Team: Treatment Team: Attending Provider: Md Edison Pace, MD; Consulting Physician: Md Edison Pace, MD; Registered Nurse: Earline Mayotte II, RN; Registered Nurse: Suzan Slick, RN; Technician: Joesph July, [place]   Subjective:  Better  Objective:  Vital signs:  Filed Vitals:   07/03/14 1716 07/03/14 2126 07/04/14 0224 07/04/14 0508  BP:  138/80 126/69 114/66  Pulse:  54 64 64  Temp:  98.4 F (36.9 C) 97.8 F (36.6 C) 97.6 F (36.4 C)  TempSrc:  Oral Oral Oral  Resp:  16 14 14   Height: 5' 0.5" (1.537 m)     Weight: 233 lb 12.8 oz (106.051 kg)     SpO2:  96% 99% 93%    Last BM Date: 07/03/14  Intake/Output   Yesterday:  07/06 0701 - 07/07 0700 In: 1278.8 [P.O.:240; I.V.:1038.8] Out: -  This shift:     Bowel function:  Flatus: y  BM: n  Drain: n/a  Physical Exam:  General: Pt awake/alert/oriented x4 in no acute distress Eyes: PERRL, normal EOM.  Sclera clear.  No icterus Neuro: CN II-XII intact w/o focal sensory/motor deficits. Lymph: No head/neck/groin lymphadenopathy Psych:  No delerium/psychosis/paranoia HENT: Normocephalic, Mucus membranes moist.  No thrush Neck: Supple, No tracheal deviation Chest: No chest wall pain w good excursion CV:  Pulses intact.  Regular rhythm MS: Normal AROM mjr joints.  No obvious deformity Abdomen: Soft.  Nondistended.  Mildly tender at RUQ only.  No evidence of peritonitis.  No incarcerated hernias. Ext:  SCDs BLE.  No mjr edema.  No cyanosis Skin: No petechiae / purpura   Problem List:   Active Problems:   Symptomatic cholelithiasis   Assessment  Jenna [name]  80 y.o.  female     Procedure(s): LAPAROSCOPIC CHOLECYSTECTOMY WITH INTRAOPERATIVE CHOLANGIOGRAM  Chronic cholecystitis  Plan:  Lap chole:  The anatomy & physiology of hepatobiliary & pancreatic function was discussed.  The pathophysiology of gallbladder dysfunction was discussed.  Natural history risks without surgery was discussed.   I feel the risks of no intervention will lead to serious problems that outweigh the operative risks; therefore, I recommended cholecystectomy to remove the pathology.  I explained laparoscopic techniques with possible need for an open approach.  Probable cholangiogram to evaluate the bilary tract was explained as well.    Risks such as bleeding, infection, abscess, leak, injury to other organs, need for further treatment, heart attack, death, and other risks were discussed.  I noted a good likelihood this will help address the problem.  Possibility that this will not correct all abdominal symptoms was explained.  Goals of post-operative recovery were discussed as well.  We will work to minimize complications.  An educational handout further explaining the pathology and treatment options was given as well.  Questions were answered.  The patient expresses understanding & wishes to proceed with surgery.    -VTE prophylaxis- SCDs, etc -mobilize as tolerated to help recovery  Adin Hector, M.D., F.A.C.S. Gastrointestinal and [facility]imally Invasive Surgery [facility] [address] [place], [place] [address] [phone] Main / Paging   07/04/2014   Results:   Labs: Results for orders placed during the hospital encounter  of 07/03/14 (from the past 48 hour(s))  CBC WITH DIFFERENTIAL     Status: Abnormal   Collection Time    07/03/14  1:27 PM      Result Value Ref Range   WBC 5.3  4.0 - 10.5 K/uL   RBC 4.24  3.87 - 5.11 MIL/uL   Hemoglobin 11.6 (*) 12.0 - 15.0 g/dL   HCT 35.0 (*) 36.0 - 46.0 %   MCV 82.5  78.0 - 100.0 fL   MCH  27.4  26.0 - 34.0 pg   MCHC 33.1  30.0 - 36.0 g/dL   RDW 13.8  11.5 - 15.5 %   Platelets 283  150 - 400 K/uL   Neutrophils Relative % 57  43 - 77 %   Neutro Abs 3.0  1.7 - 7.7 K/uL   Lymphocytes Relative 28  12 - 46 %   Lymphs Abs 1.5  0.7 - 4.0 K/uL   Monocytes Relative 10  3 - 12 %   Monocytes Absolute 0.5  0.1 - 1.0 K/uL   Eosinophils Relative 4  0 - 5 %   Eosinophils Absolute 0.2  0.0 - 0.7 K/uL   Basophils Relative 1  0 - 1 %   Basophils Absolute 0.0  0.0 - 0.1 K/uL  COMPREHENSIVE METABOLIC PANEL     Status: Abnormal   Collection Time    07/03/14  1:27 PM      Result Value Ref Range   Sodium 142  137 - 147 mEq/L   Potassium 4.1  3.7 - 5.3 mEq/L   Chloride 102  96 - 112 mEq/L   CO2 29  19 - 32 mEq/L   Glucose, Bld 186 (*) 70 - 99 mg/dL   BUN 15  6 - 23 mg/dL   Creatinine, Ser 0.68  0.50 - 1.10 mg/dL   Calcium 9.6  8.4 - 10.5 mg/dL   Total Protein 7.1  6.0 - 8.3 g/dL   Albumin 3.4 (*) 3.5 - 5.2 g/dL   AST 15  0 - 37 U/L   ALT 13  0 - 35 U/L   Alkaline Phosphatase 97  39 - 117 U/L   Total Bilirubin 0.2 (*) 0.3 - 1.2 mg/dL   GFR calc non Af Amer 80 (*) >90 mL/min   GFR calc Af Amer >90  >90 mL/min   Comment: (NOTE)     The eGFR has been calculated using the CKD EPI equation.     This calculation has not been validated in all clinical situations.     eGFR's persistently <90 mL/min signify possible Chronic Kidney     Disease.   Anion gap 11  5 - 15  LIPASE, BLOOD     Status: None   Collection Time    07/03/14  1:27 PM      Result Value Ref Range   Lipase 26  11 - 59 U/L  URINALYSIS, ROUTINE W REFLEX MICROSCOPIC     Status: None   Collection Time    07/03/14  1:34 PM      Result Value Ref Range   Color, Urine YELLOW  YELLOW   APPearance CLEAR  CLEAR   Specific Gravity, Urine 1.010  1.005 - 1.030   pH 6.0  5.0 - 8.0   Glucose, UA NEGATIVE  NEGATIVE mg/dL   Hgb urine dipstick NEGATIVE  NEGATIVE   Bilirubin Urine NEGATIVE  NEGATIVE   Ketones, ur NEGATIVE  NEGATIVE  mg/dL   Protein, ur NEGATIVE  NEGATIVE  mg/dL   Urobilinogen, UA 0.2  0.0 - 1.0 mg/dL   Nitrite NEGATIVE  NEGATIVE   Leukocytes, UA NEGATIVE  NEGATIVE   Comment: MICROSCOPIC NOT DONE ON URINES WITH NEGATIVE PROTEIN, BLOOD, LEUKOCYTES, NITRITE, OR GLUCOSE <1000 mg/dL.  SURGICAL PCR SCREEN     Status: None   Collection Time    07/03/14  5:25 PM      Result Value Ref Range   MRSA, PCR NEGATIVE  NEGATIVE   Staphylococcus aureus NEGATIVE  NEGATIVE   Comment:            The Xpert SA Assay (FDA     approved for NASAL specimens     in patients over 61 years of age),     is one component of     a comprehensive surveillance     program.  Test performance has     been validated by [employer] for patients greater     than or equal to 31 year old.     It is not intended     to diagnose infection nor to     guide or monitor treatment.  GLUCOSE, CAPILLARY     Status: Abnormal   Collection Time    07/03/14  6:16 PM      Result Value Ref Range   Glucose-Capillary 125 (*) 70 - 99 mg/dL  GLUCOSE, CAPILLARY     Status: Abnormal   Collection Time    07/03/14  9:25 PM      Result Value Ref Range   Glucose-Capillary 105 (*) 70 - 99 mg/dL  COMPREHENSIVE METABOLIC PANEL     Status: Abnormal   Collection Time    07/04/14  5:00 AM      Result Value Ref Range   Sodium 142  137 - 147 mEq/L   Potassium 4.2  3.7 - 5.3 mEq/L   Chloride 104  96 - 112 mEq/L   CO2 28  19 - 32 mEq/L   Glucose, Bld 121 (*) 70 - 99 mg/dL   BUN 9  6 - 23 mg/dL   Creatinine, Ser 0.67  0.50 - 1.10 mg/dL   Calcium 8.9  8.4 - 10.5 mg/dL   Total Protein 6.2  6.0 - 8.3 g/dL   Albumin 3.1 (*) 3.5 - 5.2 g/dL   AST 13  0 - 37 U/L   ALT 11  0 - 35 U/L   Alkaline Phosphatase 87  39 - 117 U/L   Total Bilirubin 0.2 (*) 0.3 - 1.2 mg/dL   GFR calc non Af Amer 81 (*) >90 mL/min   GFR calc Af Amer >90  >90 mL/min   Comment: (NOTE)     The eGFR has been calculated using the CKD EPI equation.     This calculation has not been  validated in all clinical situations.     eGFR's persistently <90 mL/min signify possible Chronic Kidney     Disease.   Anion gap 10  5 - 15  CBC     Status: Abnormal   Collection Time    07/04/14  5:00 AM      Result Value Ref Range   WBC 5.1  4.0 - 10.5 K/uL   RBC 4.10  3.87 - 5.11 MIL/uL   Hemoglobin 11.1 (*) 12.0 - 15.0 g/dL   HCT 34.8 (*) 36.0 - 46.0 %   MCV 84.9  78.0 - 100.0 fL   MCH 27.1  26.0 - 34.0 pg  MCHC 31.9  30.0 - 36.0 g/dL   RDW 14.2  11.5 - 15.5 %   Platelets 287  150 - 400 K/uL  LIPASE, BLOOD     Status: None   Collection Time    07/04/14  5:00 AM      Result Value Ref Range   Lipase 43  11 - 59 U/L    Imaging / Studies: US Abdomen Complete  07/03/2014   CLINICAL DATA:  Right upper quadrant pain.  EXAM: ULTRASOUND ABDOMEN COMPLETE  COMPARISON:  CT scan 06/20/2014.  FINDINGS: Gallbladder:  Gallstones are present. The largest measures 2.0 cm. These were slightly mobile. Gallbladder wall thickness 2.5 mm. Sonographic Murphy sign unreliable, as the patient was given morphine prior to the exam.  Common bile duct:  Diameter: Borderline enlarged measuring 7.0 mm. No common duct stones were seen.  Liver:  Slight liver heterogeneity, possible steatosis.  IVC:  No abnormality visualized.  Pancreas:  Visualized portion unremarkable.  Spleen:  Size and appearance within normal limits.  Right Kidney:  Length: 11.6 cm. Echogenicity within normal limits. No mass or hydronephrosis visualized.  Left Kidney:  Length: 11.0 cm. Echogenicity within normal limits. No mass or hydronephrosis visualized.  Abdominal aorta:  No aneurysm visualized.  Other findings:  None.  Good general agreement with prior CT.  IMPRESSION: Cholelithiasis. Borderline extrahepatic biliary ductal dilatation. Slight gallbladder wall thickening without pericholecystic fluid. Sonographic Murphy's sign was not elicited, but the patient was given morphine prior to the exam. Acute cholecystitis cannot completely be  excluded.   Electronically Signed   By: Rolla Flatten M.D.   On: 07/03/2014 17:05    Medications / Allergies: per chart  Antibiotics: Anti-infectives   Start     Dose/Rate Route Frequency Ordered Stop   07/04/14 0600  [MAR Hold]  ciprofloxacin (CIPRO) IVPB 400 mg     (On MAR Hold since 07/04/14 0828)   400 mg 200 mL/hr over 60 Minutes Intravenous On call to O.R. 07/03/14 1552 07/05/14 0559   07/04/14 0600  ciprofloxacin (CIPRO) IVPB 400 mg  Status:  Discontinued     400 mg 200 mL/hr over 60 Minutes Intravenous On call to O.R. 07/03/14 1700 07/03/14 1724       Note: This dictation was prepared with voice recognition software technology. In this process, transcriptional errors may occur.  Attempts are made to proofread & provide accurate documentation.  Any errors are unintentional.

## 2014-07-04 NOTE — Progress Notes (Signed)
Subjective: Pt had some pain last PM after potatoe soup.  Better now and ready for surgery  Objective: Vital signs in last 24 hours: Temp:  [97.6 F (36.4 C)-98.4 F (36.9 C)] 97.6 F (36.4 C) (07/07 0508) Pulse Rate:  [54-65] 64 (07/07 0508) Resp:  [12-18] 14 (07/07 0508) BP: (114-140)/(54-80) 114/66 mmHg (07/07 0508) SpO2:  [93 %-100 %] 93 % (07/07 0508) Weight:  [106.051 kg (233 lb 12.8 oz)] 106.051 kg (233 lb 12.8 oz) (07/06 1716) Last BM Date: 07/03/14 Afebrile, VSS Labs OK Intake/Output from previous day: 07/06 0701 - 07/07 0700 In: 1278.8 [P.O.:240; I.V.:1038.8] Out: -  Intake/Output this shift:    General appearance: alert, cooperative and no distress GI: soft, non-tender; bowel sounds normal; no masses,  no organomegaly  Lab Results:   Recent Labs  07/03/14 1327 07/04/14 0500  WBC 5.3 5.1  HGB 11.6* 11.1*  HCT 35.0* 34.8*  PLT 283 287    BMET  Recent Labs  07/03/14 1327 07/04/14 0500  NA 142 142  K 4.1 4.2  CL 102 104  CO2 29 28  GLUCOSE 186* 121*  BUN 15 9  CREATININE 0.68 0.67  CALCIUM 9.6 8.9   PT/INR No results found for this basename: LABPROT, INR,  in the last 72 hours   Recent Labs Lab 07/03/14 1327 07/04/14 0500  AST 15 13  ALT 13 11  ALKPHOS 97 87  BILITOT 0.2* 0.2*  PROT 7.1 6.2  ALBUMIN 3.4* 3.1*     Lipase     Component Value Date/Time   LIPASE 43 07/04/2014 0500     Studies/Results: US Abdomen Complete  07/03/2014   CLINICAL DATA:  Right upper quadrant pain.  EXAM: ULTRASOUND ABDOMEN COMPLETE  COMPARISON:  CT scan 06/20/2014.  FINDINGS: Gallbladder:  Gallstones are present. The largest measures 2.0 cm. These were slightly mobile. Gallbladder wall thickness 2.5 mm. Sonographic Murphy sign unreliable, as the patient was given morphine prior to the exam.  Common bile duct:  Diameter: Borderline enlarged measuring 7.0 mm. No common duct stones were seen.  Liver:  Slight liver heterogeneity, possible steatosis.  IVC:  No  abnormality visualized.  Pancreas:  Visualized portion unremarkable.  Spleen:  Size and appearance within normal limits.  Right Kidney:  Length: 11.6 cm. Echogenicity within normal limits. No mass or hydronephrosis visualized.  Left Kidney:  Length: 11.0 cm. Echogenicity within normal limits. No mass or hydronephrosis visualized.  Abdominal aorta:  No aneurysm visualized.  Other findings:  None.  Good general agreement with prior CT.  IMPRESSION: Cholelithiasis. Borderline extrahepatic biliary ductal dilatation. Slight gallbladder wall thickening without pericholecystic fluid. Sonographic Murphy's sign was not elicited, but the patient was given morphine prior to the exam. Acute cholecystitis cannot completely be excluded.   Electronically Signed   By: Rolla Flatten M.D.   On: 07/03/2014 17:05    Medications: . ciprofloxacin  400 mg Intravenous On Call to OR  . docusate sodium  200 mg Oral QHS  . psyllium  1 packet Oral QHS    Assessment/Plan 1. Recurrent RUQ post prandial pain with cholelithiasis.  2. Hx of endometrial cancer with hysterectomy; Left inguinal lymph node/venous varix on CT scan.  3. Arthritis with Multilevel lumbar spondylosis and degenerative disc disease.  4. Hypertension  5. AODM, on oral medications  6. Hyperlipidemia  7. Over active bladder  8. Obesity Body mass index is 44.89    Plan:  Surgery today. I have explained the procedure, risks, and aftercare of cholecystectomy.  Risks include but are not limited to bleeding, infection, wound problems, anesthesia, diarrhea, bile leak, injury to common bile duct/liver/intestine.  She seems to understand and agrees to proceed.     LOS: 1 day    Shaquanda Graves 07/04/2014

## 2014-07-04 NOTE — Progress Notes (Signed)
Better Lap chole:  The anatomy & physiology of hepatobiliary & pancreatic function was discussed.  The pathophysiology of gallbladder dysfunction was discussed.  Natural history risks without surgery was discussed.   I feel the risks of no intervention will lead to serious problems that outweigh the operative risks; therefore, I recommended cholecystectomy to remove the pathology.  I explained laparoscopic techniques with possible need for an open approach.  Probable cholangiogram to evaluate the bilary tract was explained as well.    Risks such as bleeding, infection, abscess, leak, injury to other organs, need for further treatment, heart attack, death, and other risks were discussed.  I noted a good likelihood this will help address the problem.  Possibility that this will not correct all abdominal symptoms was explained.  Goals of post-operative recovery were discussed as well.  We will work to minimize complications.  An educational handout further explaining the pathology and treatment options was given as well.  Questions were answered.  The patient expresses understanding & wishes to proceed with surgery.

## 2014-07-04 NOTE — Op Note (Signed)
07/03/2014 - 07/04/2014  11:29 AM  PATIENT:  Jenna [name]  80 y.o. female  Patient Care Team: Lujean Amel, MD as PCP - General (Family Medicine)  PRE-OPERATIVE DIAGNOSIS:  gallstones  POST-OPERATIVE DIAGNOSIS:    Chronic cholecystitis w gallstones  PROCEDURE:  Procedure(s): LAPAROSCOPIC CHOLECYSTECTOMY SINGLE SITE  SURGEON:  Surgeon(s): Adin Hector, MD  ASSISTANT: RN   ANESTHESIA:   local and general  EBL:  Total I/O In: 1000 [I.V.:1000] Out: -   Delay start of Pharmacological VTE agent (>24hrs) due to surgical blood loss or risk of bleeding:  no  DRAINS: none   SPECIMEN:  Source of Specimen:  Gallbladder w gallstones  DISPOSITION OF SPECIMEN:  PATHOLOGY  COUNTS:  YES  PLAN OF CARE: Discharge to home after PACU  PATIENT DISPOSITION:  PACU - hemodynamically stable.  INDICATION: Were obese female with recurrent episodes of biliary colic and gallstones. Cholecystectomy recommended. Head worsening attack and came to the emergency room. I recommended cholecystectomy this hospital stay  The anatomy & physiology of hepatobiliary & pancreatic function was discussed.  The pathophysiology of gallbladder dysfunction was discussed.  Natural history risks without surgery was discussed.   I feel the risks of no intervention will lead to serious problems that outweigh the operative risks; therefore, I recommended cholecystectomy to remove the pathology.  I explained laparoscopic techniques with possible need for an open approach.  Probable cholangiogram to evaluate the bilary tract was explained as well.    Risks such as bleeding, infection, abscess, leak, injury to other organs, need for further treatment, heart attack, death, and other risks were discussed.  I noted a good likelihood this will help address the problem.  Possibility that this will not correct all abdominal symptoms was explained.  Goals of post-operative recovery were discussed as well.  We will work to  minimize complications.  An educational handout further explaining the pathology and treatment options was given as well.  Questions were answered.  The patient expresses understanding & wishes to proceed with surgery.   OR FINDINGS: Dilated bulky gallbladder with adhesions consistent with chronic cholecystitis. Fatty change of the liver.   DESCRIPTION:   The patient was identified & brought in the operating room. The patient was positioned supine with arms tucked. SCDs were active during the entire case. The patient underwent general anesthesia without any difficulty.  The abdomen was prepped and draped in a sterile fashion. A Surgical Timeout confirmed our plan.  I made a transverse curvilinear incision through the superior umbilical fold.  I placed a 71mm long port through the supraumbilical fascia using a modified Hassan cutdown technique. I began carbon dioxide insufflation. Camera inspection revealed no injury. There were no adhesions to the anterior abdominal wall supraumbilically.  I proceeded to continue with single site technique. I placed a #5 port in left upper aspect of the wound. I placed a 5 mm atraumatic grasper in the right inferior aspect of the wound.  I turned attention to the right upper quadrant.  The gallbladder fundus was elevated cephalad.  I freed moderately dense omental adhesions off the gallbladder. I freed the peritoneal coverings between the gallbladder and the liver on the posteriolateral and anteriomedial walls. I alternated between Harmonic & blunt Maryland dissection to help get a good critical view of the cystic artery and cystic duct. I did further dissection to free a few centimeters of the  gallbladder off the liver bed to get a good critical view of the infundibulum and cystic duct.  I mobilized the cystic artery; and, after getting a good 360 view, ligated the cystic artery using the Harmonic ultrasonic dissection. I skeletonized the cystic duct.  In this process  the gallbladder avulsed off the cystic duct.  Head a 1.5 cm cystic duct stump. I was able to ligated it using a 0 PDS Endoloop and clips.  I freed the gallbladder from its remaining attachments to the liver. I ensured hemostasis on the gallbladder fossa of the liver and elsewhere. I reviewed a few stones that had spilled out of the gallbladder.   I inspected the rest of the abdomen & detected no injury nor bleeding elsewhere.  I removed the gallbladder & stones in the bag out the supraumbilical fascia. I did reinspection. A good copious irrigation of over 4 L. Hemostasis was good. No stones. No injury to colon or bowel or other organs  Fatty hepatosis of the liver but no cirrhosis. Good ligation of the cystic duct stump. No leak of bile.  Carbon dioxide was evacuated. Ports were removed.  I closed the fascia transversely using 0 Vicryl & PDS interrupted stitches.   A closed the skin using 4-0 monocryl stitch.  Sterile dressing was applied. The patient was extubated & arrived in the PACU in stable condition..  I had discussed postoperative care with the patient in the holding area.  I am about to locate the patient's family and discuss operative findings and postoperative goals / instructions.  Instructions are written in the chart as well.

## 2014-07-04 NOTE — Anesthesia Preprocedure Evaluation (Signed)
Anesthesia Evaluation  Patient identified by MRN, date of birth, ID band Patient awake    Reviewed: Allergy & Precautions, H&P , NPO status , Patient's Chart, lab work & pertinent test results  Airway Mallampati: II TM Distance: >3 FB     Dental  (+) Edentulous Upper, Edentulous Lower   Pulmonary neg pulmonary ROS,  breath sounds clear to auscultation        Cardiovascular hypertension, Pt. on medications Rhythm:Regular Rate:Normal     Neuro/Psych negative neurological ROS  negative psych ROS   GI/Hepatic negative GI ROS, Neg liver ROS,   Endo/Other  diabetes, Type 2, Oral Hypoglycemic AgentsMorbid obesity  Renal/GU negative Renal ROS     Musculoskeletal negative musculoskeletal ROS (+)   Abdominal (+) + obese,   Peds  Hematology negative hematology ROS (+)   Anesthesia Other Findings   Reproductive/Obstetrics negative OB ROS                           Anesthesia Physical Anesthesia Plan  ASA: III  Anesthesia Plan: General   Post-op Pain Management:    Induction: Intravenous  Airway Management Planned: Oral ETT  Additional Equipment:   Intra-op Plan:   Post-operative Plan: Extubation in OR  Informed Consent: I have reviewed the patients History and Physical, chart, labs and discussed the procedure including the risks, benefits and alternatives for the proposed anesthesia with the patient or authorized representative who has indicated his/her understanding and acceptance.   Dental advisory given  Plan Discussed with: CRNA  Anesthesia Plan Comments:         Anesthesia Quick Evaluation

## 2014-07-04 NOTE — Transfer of Care (Signed)
Immediate Anesthesia Transfer of Care Note  Patient: Jenna [name]  Procedure(s) Performed: Procedure(s): LAPAROSCOPIC CHOLECYSTECTOMY SINGLE PORT (N/A)  Patient Location: PACU  Anesthesia Type:General  Level of Consciousness: awake, oriented, patient cooperative, lethargic and responds to stimulation  Airway & Oxygen Therapy: Patient Spontanous Breathing and Patient connected to face mask oxygen  Post-op Assessment: Report given to PACU RN, Post -op Vital signs reviewed and stable and Patient moving all extremities  Post vital signs: Reviewed and stable  Complications: No apparent anesthesia complications

## 2014-07-04 NOTE — Anesthesia Postprocedure Evaluation (Signed)
Anesthesia Post Note  Patient: Jenna [name]  Procedure(s) Performed: Procedure(s) (LRB): LAPAROSCOPIC CHOLECYSTECTOMY SINGLE PORT (N/A)  Anesthesia type: General  Patient location: PACU  Post pain: Pain level controlled  Post assessment: Post-op Vital signs reviewed  Last Vitals: BP 172/72  Pulse 74  Temp(Src) 36.4 C (Oral)  Resp 16  Ht 5' 0.5" (1.537 m)  Wt 233 lb 12.8 oz (106.051 kg)  BMI 44.89 kg/m2  SpO2 100%  Post vital signs: Reviewed  Level of consciousness: sedated  Complications: No apparent anesthesia complications

## 2014-07-04 NOTE — Anesthesia Procedure Notes (Signed)
Procedure Name: Intubation Date/Time: 07/04/2014 9:54 AM Performed by: Ofilia Neas Pre-anesthesia Checklist: Patient identified, Timeout performed, Emergency Drugs available, Suction available and Patient being monitored Patient Re-evaluated:Patient Re-evaluated prior to inductionOxygen Delivery Method: Circle system utilized Preoxygenation: Pre-oxygenation with 100% oxygen Intubation Type: IV induction Ventilation: Mask ventilation without difficulty Laryngoscope Size: Mac and 4 (two loose lower teeth intact) Grade View: Grade II Tube type: Oral Tube size: 7.5 mm Number of attempts: 1 Airway Equipment and Method: Stylet Placement Confirmation: ETT inserted through vocal cords under direct vision,  breath sounds checked- equal and bilateral and positive ETCO2 Secured at: 21 cm Tube secured with: Tape Dental Injury: Teeth and Oropharynx as per pre-operative assessment

## 2014-07-04 NOTE — Discharge Instructions (Signed)
LAPAROSCOPIC SURGERY: POST OP INSTRUCTIONS ° °1. DIET: Follow a light bland diet the first 24 hours after arrival home, such as soup, liquids, crackers, etc.  Be sure to include lots of fluids daily.  Avoid fast food or heavy meals as your are more likely to get nauseated.  Eat a low fat the next few days after surgery.   °2. Take your usually prescribed home medications unless otherwise directed. °3. PAIN CONTROL: °a. Pain is best controlled by a usual combination of three different methods TOGETHER: °i. Ice/Heat °ii. Over the counter pain medication °iii. Prescription pain medication °b. Most patients will experience some swelling and bruising around the incisions.  Ice packs or heating pads (30-60 minutes up to 6 times a day) will help. Use ice for the first few days to help decrease swelling and bruising, then switch to heat to help relax tight/sore spots and speed recovery.  Some people prefer to use ice alone, heat alone, alternating between ice & heat.  Experiment to what works for you.  Swelling and bruising can take several weeks to resolve.   °c. It is helpful to take an over-the-counter pain medication regularly for the first few weeks.  Choose one of the following that works best for you: °i. Naproxen (Aleve, etc)  Two 220mg tabs twice a day °ii. Ibuprofen (Advil, etc) Three 200mg tabs four times a day (every meal & bedtime) °iii. Acetaminophen (Tylenol, etc) 500-650mg four times a day (every meal & bedtime) °d. A  prescription for pain medication (such as oxycodone, hydrocodone, etc) should be given to you upon discharge.  Take your pain medication as prescribed.  °i. If you are having problems/concerns with the prescription medicine (does not control pain, nausea, vomiting, rash, itching, etc), please call us [phone] to see if we need to switch you to a different pain medicine that will work better for you and/or control your side effect better. °ii. If you need a refill on your pain medication,  please contact your pharmacy.  They will contact our office to request authorization. Prescriptions will not be filled after 5 pm or on week-ends. °4. Avoid getting constipated.  Between the surgery and the pain medications, it is common to experience some constipation.  Increasing fluid intake and taking a fiber supplement (such as Metamucil, Citrucel, FiberCon, MiraLax, etc) 1-2 times a day regularly will usually help prevent this problem from occurring.  A mild laxative (prune juice, Milk of Magnesia, MiraLax, etc) should be taken according to package directions if there are no bowel movements after 48 hours.   °5. Watch out for diarrhea.  If you have many loose bowel movements, simplify your diet to bland foods & liquids for a few days.  Stop any stool softeners and decrease your fiber supplement.  Switching to mild anti-diarrheal medications (Kayopectate, Pepto Bismol) can help.  If this worsens or does not improve, please call us. °6. Wash / shower every day.  You may shower over the dressings as they are waterproof.  Continue to shower over incision(s) after the dressing is off. °7. Remove your waterproof bandages 5 days after surgery.  You may leave the incision open to air.  You may replace a dressing/Band-Aid to cover the incision for comfort if you wish.  °8. ACTIVITIES as tolerated:   °a. You may resume regular (light) daily activities beginning the next day--such as daily self-care, walking, climbing stairs--gradually increasing activities as tolerated.  If you can walk 30 minutes without difficulty, it   is safe to try more intense activity such as jogging, treadmill, bicycling, low-impact aerobics, swimming, etc. b. Save the most intensive and strenuous activity for last such as sit-ups, heavy lifting, contact sports, etc  Refrain from any heavy lifting or straining until you are off narcotics for pain control.   c. DO NOT PUSH THROUGH PAIN.  Let pain be your guide: If it hurts to do something, don't  do it.  Pain is your body warning you to avoid that activity for another week until the pain goes down. d. You may drive when you are no longer taking prescription pain medication, you can comfortably wear a seatbelt, and you can safely maneuver your car and apply brakes. e. Dennis Bast may have sexual intercourse when it is comfortable.  9. FOLLOW UP in our office a. Please call [facility] at [phone] to set up an appointment to see your surgeon in the office for a follow-up appointment approximately 2-3 weeks after your surgery. b. Make sure that you call for this appointment the day you arrive home to insure a convenient appointment time. 10. IF YOU HAVE DISABILITY OR FAMILY LEAVE FORMS, BRING THEM TO THE OFFICE FOR PROCESSING.  DO NOT GIVE THEM TO YOUR DOCTOR.   WHEN TO CALL us [phone]: 1. Poor pain control 2. Reactions / problems with new medications (rash/itching, nausea, etc)  3. Fever over 101.5 F (38.5 C) 4. Inability to urinate 5. Nausea and/or vomiting 6. Worsening swelling or bruising 7. Continued bleeding from incision. 8. Increased pain, redness, or drainage from the incision   The clinic staff is available to answer your questions during regular business hours (8:30am-5pm).  Please dont hesitate to call and ask to speak to one of our nurses for clinical concerns.   If you have a medical emergency, go to the nearest emergency room or call 911.  A surgeon from [facility] is always on call at the [facility], [place], [place], [place], [place]  [address] ? MAIN: [phone] ? TOLL FREE: [phone] ?  FAX [phone] [email or web address]   Cholecystitis  Cholecystitis is swelling and irritation (inflammation) of your gallbladder. This often happens when gallstones or sludge build up in the gallbladder. Treatment is needed right away. [place] care depends on how you were treated. In  general:  If you were given antibiotic medicine, take it as told. Finish the medicine even if you start to feel better.  Only take medicines as told by your doctor.  Eat low-fat foods until your next doctor visit.  Keep all doctor visits as told. GET HELP RIGHT AWAY IF:  You have more pain and medicine does not help.  Your pain moves to a different part of your belly (abdomen) or to your back.  You have a fever.  You feel sick to your stomach (nauseous).  You throw up (vomit). MAKE SURE YOU:  Understand these instructions.  Will watch your condition.  Will get help right away if you are not doing well or get worse. Document Released: 12/04/2011 Document Revised: 03/08/2012 Document Reviewed: 12/04/2011 [facility] Patient Information 2015 [place], [place]. This information is not intended to replace advice given to you by your health care provider. Make sure you discuss any questions you have with your health care provider.  Managing Pain  Pain after surgery or related to activity is often due to strain/injury to muscle, tendon, nerves and/or incisions.  This pain is usually  short-term and will improve in a few months.   Many people find it helpful to do the following things TOGETHER to help speed the process of healing and to get back to regular activity more quickly:  1. Avoid heavy physical activity a.  no lifting greater than 20 pounds b. Do not push through the pain.  Listen to your body and avoid positions and maneuvers than reproduce the pain c. Walking is okay as tolerated, but go slowly and stop when getting sore.  d. Remember: If it hurts to do it, then dont do it! 2. Take Anti-inflammatory medication  a. Take with food/snack around the clock for 1-2 weeks i. This helps the muscle and nerve tissues become less irritable and calm down faster b. Choose ONE of the following over-the-counter medications: i. Naproxen 220mg  tabs (ex. Aleve) 1-2 pills twice a day   ii. Ibuprofen 200mg  tabs (ex. Advil, Motrin) 3-4 pills with every meal and just before bedtime iii. Acetaminophen 500mg  tabs (Tylenol) 1-2 pills with every meal and just before bedtime 3. Use a Heating pad or Ice/Cold Pack a. 4-6 times a day b. May use warm bath/hottub  or showers 4. Try Gentle Massage and/or Stretching  a. at the area of pain many times a day b. stop if you feel pain - do not overdo it  Try these steps together to help you body heal faster and avoid making things get worse.  Doing just one of these things may not be enough.    If you are not getting better after two weeks or are noticing you are getting worse, contact our office for further advice; we may need to re-evaluate you & see what other things we can do to help.  GETTING TO GOOD BOWEL HEALTH. Irregular bowel habits such as constipation and diarrhea can lead to many problems over time.  Having one soft bowel movement a day is the most important way to prevent further problems.  The anorectal canal is designed to handle stretching and feces to safely manage our ability to get rid of solid waste (feces, poop, stool) out of our body.  BUT, hard constipated stools can act like ripping concrete bricks and diarrhea can be a burning fire to this very sensitive area of our body, causing inflamed hemorrhoids, anal fissures, increasing risk is perirectal abscesses, abdominal pain/bloating, an making irritable bowel worse.     The goal: ONE SOFT BOWEL MOVEMENT A DAY!  To have soft, regular bowel movements:    Drink at least 8 tall glasses of water a day.     Take plenty of fiber.  Fiber is the undigested part of plant food that passes into the colon, acting s natures broom to encourage bowel motility and movement.  Fiber can absorb and hold large amounts of water. This results in a larger, bulkier stool, which is soft and easier to pass. Work gradually over several weeks up to 6 servings a day of fiber (25g a day even more if  needed) in the form of: o Vegetables -- Root (potatoes, carrots, turnips), leafy green (lettuce, salad greens, celery, spinach), or cooked high residue (cabbage, broccoli, etc) o Fruit -- Fresh (unpeeled skin & pulp), Dried (prunes, apricots, cherries, etc ),  or stewed ( applesauce)  o Whole grain breads, pasta, etc (whole wheat)  o Bran cereals    Bulking Agents -- This type of water-retaining fiber generally is easily obtained each day by one of the following:  o Psyllium bran --  The psyllium plant is remarkable because its ground seeds can retain so much water. This product is available as Metamucil, Konsyl, Effersyllium, Per Diem Fiber, or the less expensive generic preparation in drug and health food stores. Although labeled a laxative, it really is not a laxative.  o Methylcellulose -- This is another fiber derived from wood which also retains water. It is available as Citrucel. o Polyethylene Glycol - and artificial fiber commonly called Miralax or Glycolax.  It is helpful for people with gassy or bloated feelings with regular fiber o Flax Seed - a less gassy fiber than psyllium   No reading or other relaxing activity while on the toilet. If bowel movements take longer than 5 minutes, you are too constipated   AVOID CONSTIPATION.  High fiber and water intake usually takes care of this.  Sometimes a laxative is needed to stimulate more frequent bowel movements, but    Laxatives are not a good long-term solution as it can wear the colon out. o Osmotics (Milk of Magnesia, Fleets phosphosoda, Magnesium citrate, MiraLax, GoLytely) are safer than  o Stimulants (Senokot, Castor Oil, Dulcolax, Ex Lax)    o Do not take laxatives for more than 7days in a row.    IF SEVERELY CONSTIPATED, try a Bowel Retraining Program: o Do not use laxatives.  o Eat a diet high in roughage, such as bran cereals and leafy vegetables.  o Drink six (6) ounces of prune or apricot juice each morning.  o Eat two (2)  large servings of stewed fruit each day.  o Take one (1) heaping tablespoon of a psyllium-based bulking agent twice a day. Use sugar-free sweetener when possible to avoid excessive calories.  o Eat a normal breakfast.  o Set aside 15 minutes after breakfast to sit on the toilet, but do not strain to have a bowel movement.  o If you do not have a bowel movement by the third day, use an enema and repeat the above steps.    Controlling diarrhea o Switch to liquids and simpler foods for a few days to avoid stressing your intestines further. o Avoid dairy products (especially milk & ice cream) for a short time.  The intestines often can lose the ability to digest lactose when stressed. o Avoid foods that cause gassiness or bloating.  Typical foods include beans and other legumes, cabbage, broccoli, and dairy foods.  Every person has some sensitivity to other foods, so listen to our body and avoid those foods that trigger problems for you. o Adding fiber (Citrucel, Metamucil, psyllium, Miralax) gradually can help thicken stools by absorbing excess fluid and retrain the intestines to act more normally.  Slowly increase the dose over a few weeks.  Too much fiber too soon can backfire and cause cramping & bloating. o Probiotics (such as active yogurt, Align, etc) may help repopulate the intestines and colon with normal bacteria and calm down a sensitive digestive tract.  Most studies show it to be of mild help, though, and such products can be costly. o Medicines:   Bismuth subsalicylate (ex. Kayopectate, Pepto Bismol) every 30 minutes for up to 6 doses can help control diarrhea.  Avoid if pregnant.   Loperamide (Immodium) can slow down diarrhea.  Start with two tablets (4mg  total) first and then try one tablet every 6 hours.  Avoid if you are having fevers or severe pain.  If you are not better or start feeling worse, stop all medicines and call your doctor for advice  o Call your doctor if you are getting  worse or not better.  Sometimes further testing (cultures, endoscopy, X-ray studies, bloodwork, etc) may be needed to help diagnose and treat the cause of the diarrhea. o

## 2014-07-05 ENCOUNTER — Encounter (HOSPITAL_COMMUNITY): Payer: Self-pay | Admitting: Surgery

## 2014-07-05 MED ORDER — PSYLLIUM 95 % PO PACK: 1.0000 | PACK | Freq: Every day | ORAL | Status: DC

## 2014-07-05 MED ORDER — POLYETHYLENE GLYCOL 3350 17 G PO PACK
17.0000 g | PACK | Freq: Every day | ORAL | Status: DC | PRN
Start: 2014-07-05 — End: 2014-07-05
  Filled 2014-07-05: qty 1

## 2014-07-05 MED ORDER — POLYETHYLENE GLYCOL 3350 17 G PO PACK: 17.0000 g | PACK | Freq: Every day | ORAL | Status: DC | PRN

## 2014-07-05 NOTE — ED Provider Notes (Signed)
Medical screening examination/treatment/procedure(s) were conducted as a shared visit with non-physician practitioner(s) and myself.  I personally evaluated the patient during the encounter.   EKG Interpretation None        Houston Siren III, MD 07/05/14 1505

## 2014-07-05 NOTE — ED Provider Notes (Signed)
Medical screening examination/treatment/procedure(s) were conducted as a shared visit with non-physician practitioner(s) and myself.  I personally evaluated the patient during the encounter.   EKG Interpretation None      80 yo female with recent diagnosis of cholelithiasis presenting with RUQ abdominal pain.  Got suddenly worse after breakfast today.  On exam, well appearing, nontoxic, not distressed, normal respiratory effort, normal perfusion, abdomen soft with TTP in RUQ, no /r/rg.  Plan repeat US and Surgical consult.   Admitted to surgery.  Clinical Impression: 1. Symptomatic cholelithiasis   2. Gallstones       Houston Siren III, MD 07/05/14 1505

## 2014-07-05 NOTE — Progress Notes (Signed)
1 Day Post-Op  Subjective: She wants breakfast and to go home.    Objective: Vital signs in last 24 hours: Temp:  [97.3 F (36.3 C)-98.4 F (36.9 C)] 98.4 F (36.9 C) (07/08 0605) Pulse Rate:  [64-80] 75 (07/08 0605) Resp:  [11-16] 16 (07/08 0605) BP: (138-172)/(48-84) 154/53 mmHg (07/08 0605) SpO2:  [91 %-100 %] 99 % (07/08 0605) Last BM Date: 07/03/14 960 PO  Diet: cardiac Afebrile, VSS No labs No IOC Intake/Output from previous day: 07/07 0701 - 07/08 0700 In: 2560 [P.O.:960; I.V.:1600] Out: 575 [Urine:575] Intake/Output this shift: Total I/O In: -  Out: 150 [Urine:150]  General appearance: alert, cooperative and no distress Abd:  Soft, sore, Port site looks fine.  Lab Results:   Recent Labs  07/03/14 1327 07/04/14 0500  WBC 5.3 5.1  HGB 11.6* 11.1*  HCT 35.0* 34.8*  PLT 283 287    BMET  Recent Labs  07/03/14 1327 07/04/14 0500  NA 142 142  K 4.1 4.2  CL 102 104  CO2 29 28  GLUCOSE 186* 121*  BUN 15 9  CREATININE 0.68 0.67  CALCIUM 9.6 8.9   PT/INR No results found for this basename: LABPROT, INR,  in the last 72 hours   Recent Labs Lab 07/03/14 1327 07/04/14 0500  AST 15 13  ALT 13 11  ALKPHOS 97 87  BILITOT 0.2* 0.2*  PROT 7.1 6.2  ALBUMIN 3.4* 3.1*     Lipase     Component Value Date/Time   LIPASE 43 07/04/2014 0500     Studies/Results: US Abdomen Complete  07/03/2014   CLINICAL DATA:  Right upper quadrant pain.  EXAM: ULTRASOUND ABDOMEN COMPLETE  COMPARISON:  CT scan 06/20/2014.  FINDINGS: Gallbladder:  Gallstones are present. The largest measures 2.0 cm. These were slightly mobile. Gallbladder wall thickness 2.5 mm. Sonographic Murphy sign unreliable, as the patient was given morphine prior to the exam.  Common bile duct:  Diameter: Borderline enlarged measuring 7.0 mm. No common duct stones were seen.  Liver:  Slight liver heterogeneity, possible steatosis.  IVC:  No abnormality visualized.  Pancreas:  Visualized portion  unremarkable.  Spleen:  Size and appearance within normal limits.  Right Kidney:  Length: 11.6 cm. Echogenicity within normal limits. No mass or hydronephrosis visualized.  Left Kidney:  Length: 11.0 cm. Echogenicity within normal limits. No mass or hydronephrosis visualized.  Abdominal aorta:  No aneurysm visualized.  Other findings:  None.  Good general agreement with prior CT.  IMPRESSION: Cholelithiasis. Borderline extrahepatic biliary ductal dilatation. Slight gallbladder wall thickening without pericholecystic fluid. Sonographic Murphy's sign was not elicited, but the patient was given morphine prior to the exam. Acute cholecystitis cannot completely be excluded.   Electronically Signed   By: Rolla Flatten M.D.   On: 07/03/2014 17:05    Medications: . docusate sodium  200 mg Oral QHS  . psyllium  1 packet Oral QHS    Assessment/Plan 1.  Chronic cholecystitis w gallstones   LAPAROSCOPIC CHOLECYSTECTOMY SINGLE SITE, Adin Hector, MD 07/03/14 2. Hx of endometrial cancer with hysterectomy; Left inguinal lymph node/venous varix on CT scan.  3. Arthritis with Multilevel lumbar spondylosis and degenerative disc disease.  4. Hypertension  5. AODM, on oral medications  6. Hyperlipidemia  7. Over active bladder  8. Obesity Body mass index is 44.89    Plan:  Home today              LOS: 2 days    Jenna [name] 07/05/2014

## 2014-07-05 NOTE — Progress Notes (Signed)
D/C patient from hospital when patient meets criteria (anticipate in 0-1 day(s)):  Tolerating oral intake well Ambulating in walkways Adequate pain control without IV medications Urinating  Having flatus

## 2014-07-05 NOTE — Progress Notes (Signed)
Discharge instructions reviewed with patient and family, patient to follow up with MD at [place] appointment already scheduled, patient pain controlled well with tylenol, patient is tolerating her diet without complaints of nausea or vomiting, voiding adequately, prescription given and questions and concerns answered Neta Mends RN 07-05-2014 11:06am

## 2014-07-07 NOTE — Discharge Summary (Signed)
Physician Discharge Summary  Patient ID: Jenna [name] MRN: [id number] DOB/AGE: 1931-08-29 80 y.o.  Admit date: 07/03/2014 Discharge date: 07/07/2014  Admission Diagnoses:  1. Recurrent RUQ post prandial pain with cholelithiasis.  2. Hx of endometrial cancer with hysterectomy; Left inguinal lymph node/venous varix on CT scan.  3. Arthritis with Multilevel lumbar spondylosis and degenerative disc disease.  4. Hypertension  5. AODM, on oral medications  6. Hyperlipidemia  7. Over active bladder  8. Obesity    Discharge Diagnoses:  Same  Active Problems:   Chronic cholecystitis with calculus s/p lap chole 07/04/2014   HTN (hypertension)   DM (diabetes mellitus)   Malignant neoplasm of corpus uteri, except isthmus   OAB (overactive bladder)   Hypercholesteremia   PROCEDURES: LAPAROSCOPIC CHOLECYSTECTOMY SINGLE SITE, Adin Hector, MD, 07/04/2014.        Hospital Course:  This is an 80 y/o female who presented to ED with RUQ pain on 06/20/14. CT scan showed 3, 2 cm gallstones, CBC/LFT's were normal and she was set up to see Dr. Dalbert Batman on 06/28/14. They agreed she would benefit from surgery and this is being scheduled, but at this point a date has not been found. She has been at home and started having some discomfort yesterday, but was able to eat a regular supper and sleep last PM with just an Aleve for pain. She has pain medicine but does not like to take it for fear of constipation. Today when she got up the pain in the RUQ was much worse. She ate some grits for breakfast and symptoms became worse. She had some nausea and diarrhea, but no vomiting. She presented to the ED and after treatment with morphine her pain has resolved.  Her work up shows she is afebrile, VSS, glucose is up but LFT'S, LIPASE and WBC are normal. At his point Dr. Dalbert Batman cannot do her until after 7/21. She was seen in the ED, and admitted by Dr. Johney Maine.  She was placed on antibiotics and fluids.  She had some  potatoes soup but had recurrent pain with this.  No further nausea.  She underwent cholecystectomy with IOC on 07/04/14.  She did well, with some mild post op nausea.  She was mobilized and her diet was advanced the following day.  She was discharged later that day. Follow up in 2-3 weeks with our office.  Condition on d/c:  Improved.   Disposition: 01-Home or Self Care      Discharge Instructions   Call MD for:  extreme fatigue    Complete by:  As directed      Call MD for:  hives    Complete by:  As directed      Call MD for:  persistant nausea and vomiting    Complete by:  As directed      Call MD for:  redness, tenderness, or signs of infection (pain, swelling, redness, odor or green/yellow discharge around incision site)    Complete by:  As directed      Call MD for:  severe uncontrolled pain    Complete by:  As directed      Call MD for:    Complete by:  As directed   Temperature > 101.74F     Diet - low sodium heart healthy    Complete by:  As directed      Discharge instructions    Complete by:  As directed   Please see discharge instruction sheets.  Also refer  to handout given an office.  Please call our office if you have any questions or concerns [phone]     Discharge wound care:    Complete by:  As directed   If you have closed incisions, shower and bathe over these incisions with soap and water every day.  Remove all surgical dressings on postoperative day #3.  You do not need to replace dressings over the closed incisions unless you feel more comfortable with a Band-Aid covering it.   If you have an open wound that requires packing, please see wound care instructions.  In general, remove all dressings, wash wound with soap and water and then replace with saline moistened gauze.  Do the dressing change at least every day.  Please call our office [phone] if you have further questions.     Driving Restrictions    Complete by:  As directed   No driving until off  narcotics and can safely swerve away without pain during an emergency     Increase activity slowly    Complete by:  As directed   Walk an hour a day.  Use 20-30 minute walks.  When you can walk 30 minutes without difficulty, increase to low impact/moderate activities such as biking, jogging, swimming, sexual activity..  Eventually can increase to unrestricted activity when not feeling pain.  If you feel pain: STOP!Marland Kitchen   Let pain protect you from overdoing it.  Use ice/heat/over-the-counter pain medications to help minimize his soreness.  Use pain prescriptions as needed to remain active.  It is better to take extra pain medications and be more active than to stay bedridden to avoid all pain medications.     Lifting restrictions    Complete by:  As directed   Avoid heavy lifting initially.  Do not push through pain.  You have no specific weight limit.  Coughing and sneezing or four more stressful to your incision than any lifting you will do. Pain will protect you from injury.  Therefore, avoid intense activity until off all narcotic pain medications.  Coughing and sneezing or four more stressful to your incision than any lifting he will do.     May shower / Bathe    Complete by:  As directed      May walk up steps    Complete by:  As directed      Sexual Activity Restrictions    Complete by:  As directed   Sexual activity as tolerated.  Do not push through pain.  Pain will protect you from injury.     Walk with assistance    Complete by:  As directed   Walk over an hour a day.  May use a walker/cane/companion to help with balance and stamina.            Medication List         amLODipine 10 MG tablet  Commonly known as:  NORVASC  Take 10 mg by mouth daily.     aspirin 81 MG tablet  Take 81 mg by mouth daily.     atorvastatin 20 MG tablet  Commonly known as:  LIPITOR  Take 20 mg by mouth daily.     furosemide 20 MG tablet  Commonly known as:  LASIX  Take 20 mg by mouth daily as  needed for fluid.     glipiZIDE 2.5 MG 24 hr tablet  Commonly known as:  GLUCOTROL XL  Take 2.5 mg by mouth daily.  hydrochlorothiazide 25 MG tablet  Commonly known as:  HYDRODIURIL  Take 25 mg by mouth daily.     HYDROcodone-acetaminophen 5-325 MG per tablet  Commonly known as:  NORCO/VICODIN  Take 1-2 tablets by mouth every 4 (four) hours as needed for moderate pain or severe pain.     ibuprofen 600 MG tablet  Commonly known as:  ADVIL,MOTRIN  Take 1 tablet (600 mg total) by mouth every 6 (six) hours as needed (pain).     latanoprost 0.005 % ophthalmic solution  Commonly known as:  XALATAN  Place 1 drop into both eyes at bedtime.     losartan 100 MG tablet  Commonly known as:  COZAAR  Take 100 mg by mouth daily.     polyethylene glycol packet  Commonly known as:  MIRALAX / GLYCOLAX  Take 17 g by mouth daily as needed for mild constipation or moderate constipation.     psyllium 95 % Pack  Commonly known as:  HYDROCIL/METAMUCIL  Take 1 packet by mouth at bedtime.       Follow-up Information   Follow up with [facility] On 07/25/2014. (Your appointment is for 3 PM, be there 30 minutes early for check in.)    Contact information:   [place] [address] [phone]       Follow up with Lujean Amel, MD. (Call and follow up on your medical issues.)    Specialty:  Family Medicine   Contact information:   [address] [address] [phone]       Signed: Earnstine Regal 07/07/2014, 4:20 PM

## 2014-07-07 NOTE — Discharge Summary (Signed)
Improve.  At discharge goals.  Return to clinic with close followup.

## 2014-07-11 DIAGNOSIS — M25819 Other specified joint disorders, unspecified shoulder: Secondary | ICD-10-CM | POA: Diagnosis not present

## 2014-07-25 ENCOUNTER — Ambulatory Visit (INDEPENDENT_AMBULATORY_CARE_PROVIDER_SITE_OTHER): Payer: Medicare Other | Admitting: General Surgery

## 2014-07-25 ENCOUNTER — Encounter (INDEPENDENT_AMBULATORY_CARE_PROVIDER_SITE_OTHER): Payer: Self-pay | Admitting: *Deleted

## 2014-07-25 ENCOUNTER — Encounter (INDEPENDENT_AMBULATORY_CARE_PROVIDER_SITE_OTHER): Payer: Self-pay | Admitting: General Surgery

## 2014-07-25 VITALS — BP 126/70 | HR 90 | Temp 97.5°F | Ht 61.0 in | Wt 230.0 lb

## 2014-07-25 DIAGNOSIS — K811 Chronic cholecystitis: Secondary | ICD-10-CM

## 2014-07-25 NOTE — Progress Notes (Signed)
Jenna [name] March 15, 1931 [id number] 07/25/2014   Jenna [name] is a 80 y.o. female who had a laparoscopic cholecystectomy by Dr. Johney Maine.  The pathology report confirmed chronic cholecystitis with cholelithiasis.  The patient reports that they are feeling well with normal bowel movements and good appetite.  The pre-operative symptoms of abdominal pain, nausea, and vomiting have resolved.    Physical examination - Incisions appear well-healed with no sign of infection or bleeding.   Abdomen - soft, non-tender  Impression:  s/p laparoscopic cholecystectomy  Plan:  She may resume a regular diet and full activity.  She may follow-up on a PRN basis.

## 2014-07-25 NOTE — Patient Instructions (Signed)
Follow up as needed

## 2014-08-18 DIAGNOSIS — M25819 Other specified joint disorders, unspecified shoulder: Secondary | ICD-10-CM | POA: Diagnosis not present

## 2014-08-22 ENCOUNTER — Emergency Department (HOSPITAL_COMMUNITY): Payer: Medicare Other

## 2014-08-22 ENCOUNTER — Emergency Department (HOSPITAL_COMMUNITY)
Admission: EM | Admit: 2014-08-22 | Discharge: 2014-08-22 | Disposition: A | Discharge status: Home / Self Care | Payer: Medicare Other | Attending: Emergency Medicine | Admitting: Emergency Medicine

## 2014-08-22 ENCOUNTER — Encounter (HOSPITAL_COMMUNITY): Payer: Self-pay | Admitting: Emergency Medicine

## 2014-08-22 DIAGNOSIS — IMO0002 Reserved for concepts with insufficient information to code with codable children: Secondary | ICD-10-CM | POA: Diagnosis not present

## 2014-08-22 DIAGNOSIS — Z8742 Personal history of other diseases of the female genital tract: Secondary | ICD-10-CM | POA: Insufficient documentation

## 2014-08-22 DIAGNOSIS — M129 Arthropathy, unspecified: Secondary | ICD-10-CM | POA: Insufficient documentation

## 2014-08-22 DIAGNOSIS — S060X9A Concussion with loss of consciousness of unspecified duration, initial encounter: Secondary | ICD-10-CM | POA: Insufficient documentation

## 2014-08-22 DIAGNOSIS — I10 Essential (primary) hypertension: Secondary | ICD-10-CM | POA: Insufficient documentation

## 2014-08-22 DIAGNOSIS — M25569 Pain in unspecified knee: Secondary | ICD-10-CM | POA: Diagnosis not present

## 2014-08-22 DIAGNOSIS — S4980XA Other specified injuries of shoulder and upper arm, unspecified arm, initial encounter: Secondary | ICD-10-CM | POA: Diagnosis not present

## 2014-08-22 DIAGNOSIS — Y990 Civilian activity done for income or pay: Secondary | ICD-10-CM | POA: Diagnosis not present

## 2014-08-22 DIAGNOSIS — S46909A Unspecified injury of unspecified muscle, fascia and tendon at shoulder and upper arm level, unspecified arm, initial encounter: Secondary | ICD-10-CM | POA: Insufficient documentation

## 2014-08-22 DIAGNOSIS — Z7982 Long term (current) use of aspirin: Secondary | ICD-10-CM | POA: Insufficient documentation

## 2014-08-22 DIAGNOSIS — Z8542 Personal history of malignant neoplasm of other parts of uterus: Secondary | ICD-10-CM | POA: Insufficient documentation

## 2014-08-22 DIAGNOSIS — M542 Cervicalgia: Secondary | ICD-10-CM | POA: Diagnosis not present

## 2014-08-22 DIAGNOSIS — R51 Headache: Secondary | ICD-10-CM | POA: Diagnosis not present

## 2014-08-22 DIAGNOSIS — S0990XA Unspecified injury of head, initial encounter: Secondary | ICD-10-CM | POA: Diagnosis not present

## 2014-08-22 DIAGNOSIS — E785 Hyperlipidemia, unspecified: Secondary | ICD-10-CM | POA: Diagnosis not present

## 2014-08-22 DIAGNOSIS — S79919A Unspecified injury of unspecified hip, initial encounter: Secondary | ICD-10-CM | POA: Insufficient documentation

## 2014-08-22 DIAGNOSIS — Y9301 Activity, walking, marching and hiking: Secondary | ICD-10-CM | POA: Diagnosis not present

## 2014-08-22 DIAGNOSIS — Y9289 Other specified places as the place of occurrence of the external cause: Secondary | ICD-10-CM | POA: Insufficient documentation

## 2014-08-22 DIAGNOSIS — E119 Type 2 diabetes mellitus without complications: Secondary | ICD-10-CM | POA: Insufficient documentation

## 2014-08-22 DIAGNOSIS — S79929A Unspecified injury of unspecified thigh, initial encounter: Secondary | ICD-10-CM | POA: Diagnosis not present

## 2014-08-22 DIAGNOSIS — M79609 Pain in unspecified limb: Secondary | ICD-10-CM | POA: Diagnosis not present

## 2014-08-22 DIAGNOSIS — S0993XA Unspecified injury of face, initial encounter: Secondary | ICD-10-CM | POA: Diagnosis not present

## 2014-08-22 DIAGNOSIS — Z8589 Personal history of malignant neoplasm of other organs and systems: Secondary | ICD-10-CM | POA: Diagnosis not present

## 2014-08-22 DIAGNOSIS — Z79899 Other long term (current) drug therapy: Secondary | ICD-10-CM | POA: Diagnosis not present

## 2014-08-22 DIAGNOSIS — W010XXA Fall on same level from slipping, tripping and stumbling without subsequent striking against object, initial encounter: Secondary | ICD-10-CM | POA: Insufficient documentation

## 2014-08-22 DIAGNOSIS — W1809XA Striking against other object with subsequent fall, initial encounter: Secondary | ICD-10-CM | POA: Insufficient documentation

## 2014-08-22 DIAGNOSIS — W19XXXA Unspecified fall, initial encounter: Secondary | ICD-10-CM

## 2014-08-22 DIAGNOSIS — S99929A Unspecified injury of unspecified foot, initial encounter: Secondary | ICD-10-CM | POA: Diagnosis not present

## 2014-08-22 DIAGNOSIS — S8391XA Sprain of unspecified site of right knee, initial encounter: Secondary | ICD-10-CM

## 2014-08-22 DIAGNOSIS — S8990XA Unspecified injury of unspecified lower leg, initial encounter: Secondary | ICD-10-CM | POA: Diagnosis not present

## 2014-08-22 NOTE — Discharge Instructions (Signed)
Concussion A concussion, or closed-head injury, is a brain injury caused by a direct blow to the head or by a quick and sudden movement (jolt) of the head or neck. Concussions are usually not life-threatening. Even so, the effects of a concussion can be serious. If you have had a concussion before, you are more likely to experience concussion-like symptoms after a direct blow to the head.  CAUSES  Direct blow to the head, such as from running into another player during a soccer game, being hit in a fight, or hitting your head on a hard surface.  A jolt of the head or neck that causes the brain to move back and forth inside the skull, such as in a car crash. SIGNS AND SYMPTOMS The signs of a concussion can be hard to notice. Early on, they may be missed by you, family members, and health care providers. You may look fine but act or feel differently. Symptoms are usually temporary, but they may last for days, weeks, or even longer. Some symptoms may appear right away while others may not show up for hours or days. Every head injury is different. Symptoms include:  Mild to moderate headaches that will not go away.  A feeling of pressure inside your head.  Having more trouble than usual:  Learning or remembering things you have heard.  Answering questions.  Paying attention or concentrating.  Organizing daily tasks.  Making decisions and solving problems.  Slowness in thinking, acting or reacting, speaking, or reading.  Getting lost or being easily confused.  Feeling tired all the time or lacking energy (fatigued).  Feeling drowsy.  Sleep disturbances.  Sleeping more than usual.  Sleeping less than usual.  Trouble falling asleep.  Trouble sleeping (insomnia).  Loss of balance or feeling lightheaded or dizzy.  Nausea or vomiting.  Numbness or tingling.  Increased sensitivity to:  Sounds.  Lights.  Distractions.  Vision problems or eyes that tire  easily.  Diminished sense of taste or smell.  Ringing in the ears.  Mood changes such as feeling sad or anxious.  Becoming easily irritated or angry for little or no reason.  Lack of motivation.  Seeing or hearing things other people do not see or hear (hallucinations). DIAGNOSIS Your health care provider can usually diagnose a concussion based on a description of your injury and symptoms. He or she will ask whether you passed out (lost consciousness) and whether you are having trouble remembering events that happened right before and during your injury. Your evaluation might include:  A brain scan to look for signs of injury to the brain. Even if the test shows no injury, you may still have a concussion.  Blood tests to be sure other problems are not present. TREATMENT  Concussions are usually treated in an emergency department, in urgent care, or at a clinic. You may need to stay in the hospital overnight for further treatment.  Tell your health care provider if you are taking any medicines, including prescription medicines, over-the-counter medicines, and natural remedies. Some medicines, such as blood thinners (anticoagulants) and aspirin, may increase the chance of complications. Also tell your health care provider whether you have had alcohol or are taking illegal drugs. This information may affect treatment.  Your health care provider will send you home with important instructions to follow.  How fast you will recover from a concussion depends on many factors. These factors include how severe your concussion is, what part of your brain was injured, your  age, and how healthy you were before the concussion.  Most people with mild injuries recover fully. Recovery can take time. In general, recovery is slower in older persons. Also, persons who have had a concussion in the past or have other medical problems may find that it takes longer to recover from their current injury. HOME  CARE INSTRUCTIONS General Instructions  Carefully follow the directions your health care provider gave you.  Only take over-the-counter or prescription medicines for pain, discomfort, or fever as directed by your health care provider.  Take only those medicines that your health care provider has approved.  Do not drink alcohol until your health care provider says you are well enough to do so. Alcohol and certain other drugs may slow your recovery and can put you at risk of further injury.  If it is harder than usual to remember things, write them down.  If you are easily distracted, try to do one thing at a time. For example, do not try to watch TV while fixing dinner.  Talk with family members or close friends when making important decisions.  Keep all follow-up appointments. Repeated evaluation of your symptoms is recommended for your recovery.  Watch your symptoms and tell others to do the same. Complications sometimes occur after a concussion. Older adults with a brain injury may have a higher risk of serious complications, such as a blood clot on the brain.  Tell your teachers, school nurse, school counselor, coach, athletic trainer, or work Freight forwarder about your injury, symptoms, and restrictions. Tell them about what you can or cannot do. They should watch for:  Increased problems with attention or concentration.  Increased difficulty remembering or learning new information.  Increased time needed to complete tasks or assignments.  Increased irritability or decreased ability to cope with stress.  Increased symptoms.  Rest. Rest helps the brain to heal. Make sure you:  Get plenty of sleep at night. Avoid staying up late at night.  Keep the same bedtime hours on weekends and weekdays.  Rest during the day. Take daytime naps or rest breaks when you feel tired.  Limit activities that require a lot of thought or concentration. These include:  Doing homework or job-related  work.  Watching TV.  Working on the computer.  Avoid any situation where there is potential for another head injury (football, hockey, soccer, basketball, martial arts, downhill snow sports and horseback riding). Your condition will get worse every time you experience a concussion. You should avoid these activities until you are evaluated by the appropriate follow-up health care providers. Returning To Your Regular Activities You will need to return to your normal activities slowly, not all at once. You must give your body and brain enough time for recovery.  Do not return to sports or other athletic activities until your health care provider tells you it is safe to do so.  Ask your health care provider when you can drive, ride a bicycle, or operate heavy machinery. Your ability to react may be slower after a brain injury. Never do these activities if you are dizzy.  Ask your health care provider about when you can return to work or school. Preventing Another Concussion It is very important to avoid another brain injury, especially before you have recovered. In rare cases, another injury can lead to permanent brain damage, brain swelling, or death. The risk of this is greatest during the first 7-10 days after a head injury. Avoid injuries by:  Wearing a seat  belt when riding in a car.  Drinking alcohol only in moderation.  Wearing a helmet when biking, skiing, skateboarding, skating, or doing similar activities.  Avoiding activities that could lead to a second concussion, such as contact or recreational sports, until your health care provider says it is okay.  Taking safety measures in your home.  Remove clutter and tripping hazards from floors and stairways.  Use grab bars in bathrooms and handrails by stairs.  Place non-slip mats on floors and in bathtubs.  Improve lighting in dim areas. SEEK MEDICAL CARE IF:  You have increased problems paying attention or  concentrating.  You have increased difficulty remembering or learning new information.  You need more time to complete tasks or assignments than before.  You have increased irritability or decreased ability to cope with stress.  You have more symptoms than before. Seek medical care if you have any of the following symptoms for more than 2 weeks after your injury:  Lasting (chronic) headaches.  Dizziness or balance problems.  Nausea.  Vision problems.  Increased sensitivity to noise or light.  Depression or mood swings.  Anxiety or irritability.  Memory problems.  Difficulty concentrating or paying attention.  Sleep problems.  Feeling tired all the time. SEEK IMMEDIATE MEDICAL CARE IF:  You have severe or worsening headaches. These may be a sign of a blood clot in the brain.  You have weakness (even if only in one hand, leg, or part of the face).  You have numbness.  You have decreased coordination.  You vomit repeatedly.  You have increased sleepiness.  One pupil is larger than the other.  You have convulsions.  You have slurred speech.  You have increased confusion. This may be a sign of a blood clot in the brain.  You have increased restlessness, agitation, or irritability.  You are unable to recognize people or places.  You have neck pain.  It is difficult to wake you up.  You have unusual behavior changes.  You lose consciousness. MAKE SURE YOU:  Understand these instructions.  Will watch your condition.  Will get help right away if you are not doing well or get worse. Document Released: 03/06/2004 Document Revised: 12/20/2013 Document Reviewed: 07/07/2013 [facility] Patient Information 2015 [place], [place]. This information is not intended to replace advice given to you by your health care provider. Make sure you discuss any questions you have with your health care provider. Knee Pain The knee is the complex joint between your thigh and  your lower leg. It is made up of bones, tendons, ligaments, and cartilage. The bones that make up the knee are:  The femur in the thigh.  The tibia and fibula in the lower leg.  The patella or kneecap riding in the groove on the lower femur. CAUSES  Knee pain is a common complaint with many causes. A few of these causes are:  Injury, such as:  A ruptured ligament or tendon injury.  Torn cartilage.  Medical conditions, such as:  Gout  Arthritis  Infections  Overuse, over training, or overdoing a physical activity. Knee pain can be minor or severe. Knee pain can accompany debilitating injury. Minor knee problems often respond well to self-care measures or get well on their own. More serious injuries may need medical intervention or even surgery. SYMPTOMS The knee is complex. Symptoms of knee problems can vary widely. Some of the problems are:  Pain with movement and weight bearing.  Swelling and tenderness.  Buckling of the  knee.  Inability to straighten or extend your knee.  Your knee locks and you cannot straighten it.  Warmth and redness with pain and fever.  Deformity or dislocation of the kneecap. DIAGNOSIS  Determining what is wrong may be very straight forward such as when there is an injury. It can also be challenging because of the complexity of the knee. Tests to make a diagnosis may include:  Your caregiver taking a history and doing a physical exam.  Routine X-rays can be used to rule out other problems. X-rays will not reveal a cartilage tear. Some injuries of the knee can be diagnosed by:  Arthroscopy a surgical technique by which a small video camera is inserted through tiny incisions on the sides of the knee. This procedure is used to examine and repair internal knee joint problems. Tiny instruments can be used during arthroscopy to repair the torn knee cartilage (meniscus).  Arthrography is a radiology technique. A contrast liquid is directly injected  into the knee joint. Internal structures of the knee joint then become visible on X-ray film.  An MRI scan is a non X-ray radiology procedure in which magnetic fields and a computer produce two- or three-dimensional images of the inside of the knee. Cartilage tears are often visible using an MRI scanner. MRI scans have largely replaced arthrography in diagnosing cartilage tears of the knee.  Blood work.  Examination of the fluid that helps to lubricate the knee joint (synovial fluid). This is done by taking a sample out using a needle and a syringe. TREATMENT The treatment of knee problems depends on the cause. Some of these treatments are:  Depending on the injury, proper casting, splinting, surgery, or physical therapy care will be needed.  Give yourself adequate recovery time. Do not overuse your joints. If you begin to get sore during workout routines, back off. Slow down or do fewer repetitions.  For repetitive activities such as cycling or running, maintain your strength and nutrition.  Alternate muscle groups. For example, if you are a weight lifter, work the upper body on one day and the lower body the next.  Either tight or weak muscles do not give the proper support for your knee. Tight or weak muscles do not absorb the stress placed on the knee joint. Keep the muscles surrounding the knee strong.  Take care of mechanical problems.  If you have flat feet, orthotics or special shoes may help. See your caregiver if you need help.  Arch supports, sometimes with wedges on the inner or outer aspect of the heel, can help. These can shift pressure away from the side of the knee most bothered by osteoarthritis.  A brace called an "unloader" brace also may be used to help ease the pressure on the most arthritic side of the knee.  If your caregiver has prescribed crutches, braces, wraps or ice, use as directed. The acronym for this is PRICE. This means protection, rest, ice, compression,  and elevation.  Nonsteroidal anti-inflammatory drugs (NSAIDs), can help relieve pain. But if taken immediately after an injury, they may actually increase swelling. Take NSAIDs with food in your stomach. Stop them if you develop stomach problems. Do not take these if you have a history of ulcers, stomach pain, or bleeding from the bowel. Do not take without your caregiver's approval if you have problems with fluid retention, heart failure, or kidney problems.  For ongoing knee problems, physical therapy may be helpful.  Glucosamine and chondroitin are over-the-counter dietary supplements.  Both may help relieve the pain of osteoarthritis in the knee. These medicines are different from the usual anti-inflammatory drugs. Glucosamine may decrease the rate of cartilage destruction.  Injections of a corticosteroid drug into your knee joint may help reduce the symptoms of an arthritis flare-up. They may provide pain relief that lasts a few months. You may have to wait a few months between injections. The injections do have a small increased risk of infection, water retention, and elevated blood sugar levels.  Hyaluronic acid injected into damaged joints may ease pain and provide lubrication. These injections may work by reducing inflammation. A series of shots may give relief for as long as 6 months.  Topical painkillers. Applying certain ointments to your skin may help relieve the pain and stiffness of osteoarthritis. Ask your pharmacist for suggestions. Many over the-counter products are approved for temporary relief of arthritis pain.  In some countries, doctors often prescribe topical NSAIDs for relief of chronic conditions such as arthritis and tendinitis. A review of treatment with NSAID creams found that they worked as well as oral medications but without the serious side effects. PREVENTION  Maintain a healthy weight. Extra pounds put more strain on your joints.  Get strong, stay limber. Weak  muscles are a common cause of knee injuries. Stretching is important. Include flexibility exercises in your workouts.  Be smart about exercise. If you have osteoarthritis, chronic knee pain or recurring injuries, you may need to change the way you exercise. This does not mean you have to stop being active. If your knees ache after jogging or playing basketball, consider switching to swimming, water aerobics, or other low-impact activities, at least for a few days a week. Sometimes limiting high-impact activities will provide relief.  Make sure your shoes fit well. Choose footwear that is right for your sport.  Protect your knees. Use the proper gear for knee-sensitive activities. Use kneepads when playing volleyball or laying carpet. Buckle your seat belt every time you drive. Most shattered kneecaps occur in car accidents.  Rest when you are tired. SEEK MEDICAL CARE IF:  You have knee pain that is continual and does not seem to be getting better.  SEEK IMMEDIATE MEDICAL CARE IF:  Your knee joint feels hot to the touch and you have a high fever. MAKE SURE YOU:   Understand these instructions.  Will watch your condition.  Will get help right away if you are not doing well or get worse. Document Released: 10/12/2007 Document Revised: 03/08/2012 Document Reviewed: 10/12/2007 [facility] Patient Information 2015 [place], [place]. This information is not intended to replace advice given to you by your health care provider. Make sure you discuss any questions you have with your health care provider.

## 2014-08-22 NOTE — ED Notes (Signed)
Bed: [facility] Expected date:  Expected time:  Means of arrival:  Comments: ems- elderly- fall, no blood thinners

## 2014-08-22 NOTE — ED Notes (Signed)
Per [facility] pt comes from [facility] where she works, when she tripped over bolts in the floor around a beam when she was walking into the break room.  Pt c/o right knee pain, right shoulder pain, head injury. Pt denies losing consciousness. Pt denies taking anticoagulants but does take an daily aspirin.

## 2014-08-22 NOTE — ED Provider Notes (Signed)
CSN: [id number]     Arrival date & time 08/22/14  1345 History   First MD Initiated Contact with Patient 08/22/14 1454     Chief Complaint  Patient presents with  . Fall  . Knee Pain  . Head Injury    right side  . Shoulder Pain     (Consider location/radiation/quality/duration/timing/severity/associated sxs/prior Treatment) Patient is a 80 y.o. female presenting with fall.  Fall This is a new problem. The current episode started 1 to 2 hours ago. The problem occurs constantly. The problem has not changed since onset.Pertinent negatives include no chest pain, no abdominal pain, no headaches and no shortness of breath. Nothing aggravates the symptoms. Nothing relieves the symptoms. She has tried nothing for the symptoms.    Past Medical History  Diagnosis Date  . Cancer     endometrial cancer  . Diabetes mellitus   . Arthritis   . Hypertension   . Hyperlipemia   . OAB (overactive bladder)    Past Surgical History  Procedure Laterality Date  . Rotator cuff repair      right arm  . Knee surgery      left knee  . Tubal ligation    . Eye surgery      cataract  . Joint replacement      knee  . Abdominal hysterectomy  02/2008    robotic hyst BSO Bilateral LND  . Laparoscopic cholecystectomy single port N/A 07/04/2014    Procedure: LAPAROSCOPIC CHOLECYSTECTOMY SINGLE PORT;  Surgeon: Adin Hector, MD;  Location: [facility];  Service: General;  Laterality: N/A;   Family History  Problem Relation Age of Onset  . Cancer Brother 76    rectal  . Early death Brother    History  Substance Use Topics  . Smoking status: Never Smoker   . Smokeless tobacco: Never Used  . Alcohol Use: No   OB History   Grav Para Term Preterm Abortions TAB SAB Ect Mult Living   7 7 7             Review of Systems  Constitutional: Negative for fever and chills.  HENT: Negative for congestion, rhinorrhea and sore throat.   Eyes: Negative for photophobia and visual disturbance.  Respiratory:  Negative for cough and shortness of breath.   Cardiovascular: Negative for chest pain and leg swelling.  Gastrointestinal: Negative for nausea, vomiting, abdominal pain, diarrhea and constipation.  Endocrine: Negative for polyphagia and polyuria.  Genitourinary: Negative for dysuria, flank pain, vaginal bleeding, vaginal discharge and enuresis.  Musculoskeletal: Negative for back pain and gait problem.  Skin: Negative for color change and rash.  Neurological: Negative for dizziness, syncope, light-headedness, numbness and headaches.  Hematological: Negative for adenopathy. Does not bruise/bleed easily.  All other systems reviewed and are negative.     Allergies  Shrimp and Ancef  Home Medications   Prior to Admission medications   Medication Sig Start Date End Date Taking? Authorizing Provider  amLODipine (NORVASC) 10 MG tablet Take 10 mg by mouth daily.     Yes Historical Provider, MD  aspirin 81 MG tablet Take 81 mg by mouth daily.   Yes Historical Provider, MD  atorvastatin (LIPITOR) 20 MG tablet Take 20 mg by mouth daily.     Yes Historical Provider, MD  furosemide (LASIX) 20 MG tablet Take 20 mg by mouth daily as needed for fluid.  05/06/14  Yes Historical Provider, MD  glipiZIDE (GLUCOTROL XL) 2.5 MG 24 hr tablet Take 2.5 mg by  mouth daily.     Yes Historical Provider, MD  hydrochlorothiazide (HYDRODIURIL) 25 MG tablet Take 25 mg by mouth daily.  05/16/14  Yes Historical Provider, MD  ibuprofen (ADVIL,MOTRIN) 600 MG tablet Take 1 tablet (600 mg total) by mouth every 6 (six) hours as needed (pain). 07/04/14  Yes Adin Hector, MD  latanoprost (XALATAN) 0.005 % ophthalmic solution Place 1 drop into both eyes at bedtime.    Yes Historical Provider, MD  losartan (COZAAR) 100 MG tablet Take 100 mg by mouth daily.  05/16/14  Yes Historical Provider, MD  polyethylene glycol (MIRALAX / GLYCOLAX) packet Take 17 g by mouth daily as needed for mild constipation or moderate constipation. 07/05/14   Yes Earnstine Regal, PA-C   BP 135/51  Pulse 74  Temp(Src) 98.1 F (36.7 C) (Oral)  Resp 18  SpO2 99% Physical Exam  Vitals reviewed. Constitutional: She is oriented to person, place, and time. She appears well-developed and well-nourished.  HENT:  Head: Normocephalic and atraumatic.  Right Ear: External ear normal.  Left Ear: External ear normal.  Eyes: Conjunctivae and EOM are normal. Pupils are equal, round, and reactive to light.  Neck: Normal range of motion. Neck supple.  Cardiovascular: Normal rate, regular rhythm, normal heart sounds and intact distal pulses.   Pulmonary/Chest: Effort normal and breath sounds normal.  Abdominal: Soft. Bowel sounds are normal. There is no tenderness.  Musculoskeletal: Normal range of motion.       Right shoulder: She exhibits tenderness (over clavicle) and bony tenderness.       Right hip: She exhibits tenderness and bony tenderness.       Right knee: She exhibits swelling. She exhibits normal range of motion and no deformity. Tenderness found.  Neurological: She is alert and oriented to person, place, and time.  Skin: Skin is warm and dry.    ED Course  Procedures (including critical care time) Labs Review Labs Reviewed - No data to display  Imaging Review Dg Clavicle Right  08/22/2014   CLINICAL DATA:  Fall.  EXAM: RIGHT CLAVICLE - 2+ VIEWS  COMPARISON:  Chest x-ray 10/09/2006  FINDINGS: Examination demonstrates degenerative changes of the [facility] joint and glenohumeral joints. 4.5 mm well corticated bony fragment adjacent the lateral humeral head likely chronic and may be due to calcific tendinitis. No definite acute fracture or dislocation.  IMPRESSION: No acute fracture.   Electronically Signed   By: Marin Olp M.D.   On: 08/22/2014 16:12   Dg Femur Right  08/22/2014   CLINICAL DATA:  Fall.  Right upper leg pain.  EXAM: RIGHT FEMUR - 2 VIEW  COMPARISON:  None.  FINDINGS: No acute fractures involving the femur. Well preserved bone  mineral density. Joint space in the right hip well preserved for age. Tricompartment joint space narrowing and associated hypertrophic spurring involving the right knee.  IMPRESSION: No acute osseous abnormality.   Electronically Signed   By: Evangeline Dakin M.D.   On: 08/22/2014 16:14   Ct Head Wo Contrast  08/22/2014   CLINICAL DATA:  Pain post fall  EXAM: CT HEAD WITHOUT CONTRAST  CT CERVICAL SPINE WITHOUT CONTRAST  TECHNIQUE: Multidetector CT imaging of the head and cervical spine was performed following the standard protocol without intravenous contrast. Multiplanar CT image reconstructions of the cervical spine were also generated.  COMPARISON:  Brain MRI 06/23/2008  FINDINGS: CT HEAD FINDINGS  No skull fracture is noted. No intracranial hemorrhage, mass effect or midline shift. Mild cerebral atrophy. Paranasal sinuses  and mastoid air cells are unremarkable. Periventricular and patchy subcortical white matter decreased attenuation probable due to chronic small vessel ischemic changes.  No acute infarction. No mass lesion is noted on this unenhanced scan.  CT CERVICAL SPINE FINDINGS  Axial images of the cervical spine shows no acute fracture or subluxation. Computer processed images shows no acute fracture. There is probable chronic about 2 mm mild anterolisthesis C7 on T1 vertebral body. No prevertebral soft tissue swelling. Cervical airway is patent. There is disc space flattening with mild anterior spurring at C5-C6 level. Mild degenerative changes are noted C1-C2 articulation. There is no pneumothorax in visualized lung apices. There is a nodule in subcutaneous fat left upper back wall measures about 1.5 cm. This may represent a sebaceous cyst or nodule less likely a pathologic lymph node. Clinical correlation is necessary.  IMPRESSION: 1. No acute intracranial abnormality. Mild cerebral atrophy. Periventricular and patchy subcortical white matter decreased attenuation probable due to chronic small  vessel ischemic changes. 2. No acute fracture. Probable chronic mild anterolisthesis C7 on T1 vertebral body. Osteoarthritic changes as described above.   Electronically Signed   By: Lahoma Crocker M.D.   On: 08/22/2014 16:03   Ct Cervical Spine Wo Contrast  08/22/2014   CLINICAL DATA:  Pain post fall  EXAM: CT HEAD WITHOUT CONTRAST  CT CERVICAL SPINE WITHOUT CONTRAST  TECHNIQUE: Multidetector CT imaging of the head and cervical spine was performed following the standard protocol without intravenous contrast. Multiplanar CT image reconstructions of the cervical spine were also generated.  COMPARISON:  Brain MRI 06/23/2008  FINDINGS: CT HEAD FINDINGS  No skull fracture is noted. No intracranial hemorrhage, mass effect or midline shift. Mild cerebral atrophy. Paranasal sinuses and mastoid air cells are unremarkable. Periventricular and patchy subcortical white matter decreased attenuation probable due to chronic small vessel ischemic changes.  No acute infarction. No mass lesion is noted on this unenhanced scan.  CT CERVICAL SPINE FINDINGS  Axial images of the cervical spine shows no acute fracture or subluxation. Computer processed images shows no acute fracture. There is probable chronic about 2 mm mild anterolisthesis C7 on T1 vertebral body. No prevertebral soft tissue swelling. Cervical airway is patent. There is disc space flattening with mild anterior spurring at C5-C6 level. Mild degenerative changes are noted C1-C2 articulation. There is no pneumothorax in visualized lung apices. There is a nodule in subcutaneous fat left upper back wall measures about 1.5 cm. This may represent a sebaceous cyst or nodule less likely a pathologic lymph node. Clinical correlation is necessary.  IMPRESSION: 1. No acute intracranial abnormality. Mild cerebral atrophy. Periventricular and patchy subcortical white matter decreased attenuation probable due to chronic small vessel ischemic changes. 2. No acute fracture. Probable  chronic mild anterolisthesis C7 on T1 vertebral body. Osteoarthritic changes as described above.   Electronically Signed   By: Lahoma Crocker M.D.   On: 08/22/2014 16:03   Dg Knee Complete 4 Views Right  08/22/2014   CLINICAL DATA:  Fall.  Right knee pain.  EXAM: RIGHT KNEE - COMPLETE 4+ VIEW  COMPARISON:  None.  FINDINGS: No evidence of acute fracture or dislocation. Mild patellofemoral compartment joint space narrowing and moderate to severe medial and lateral compartment joint space narrowing with associated hypertrophic spurring. Bone mineral density well-preserved for age. No visible joint effusion.  IMPRESSION: No acute osseous abnormality.  Osteoarthritis.   Electronically Signed   By: Evangeline Dakin M.D.   On: 08/22/2014 16:14     EKG Interpretation  None      MDM   Final diagnoses:  Fall, initial encounter  Knee sprain, right, initial encounter  Closed head injury, initial encounter    80 y.o. female  with pertinent PMH of HTN, HLD presents with mechanical fall from standing while at work. Patient states she tripped over been, falling and hitting her head with. Brief episode of loss of consciousness. She's had no concussive symptoms since that time. She denies antecedent chest pain, shortness of breath, or other infectious symptoms. Her main complaint now is pain in her head, as well as right knee. On arrival vital signs and physical exam as above. We'll obtain x-rays of the affected areas.    Labs and imaging as above reviewed. No acute pathology on XR or CT.  Likely etiology contusion.  Stable to dc home with PCP fu.  DC home in stable condition.   1. Fall, initial encounter   2. Knee sprain, right, initial encounter   3. Closed head injury, initial encounter         Debby Freiberg, MD 08/22/14 [phone]

## 2014-08-22 NOTE — ED Notes (Signed)
Patient transported to X-ray 

## 2014-09-20 ENCOUNTER — Other Ambulatory Visit (HOSPITAL_COMMUNITY): Payer: Self-pay | Admitting: Family Medicine

## 2014-09-20 DIAGNOSIS — L98499 Non-pressure chronic ulcer of skin of other sites with unspecified severity: Secondary | ICD-10-CM | POA: Diagnosis not present

## 2014-09-20 DIAGNOSIS — Z1231 Encounter for screening mammogram for malignant neoplasm of breast: Secondary | ICD-10-CM

## 2014-09-20 DIAGNOSIS — M25569 Pain in unspecified knee: Secondary | ICD-10-CM | POA: Diagnosis not present

## 2014-09-20 DIAGNOSIS — J329 Chronic sinusitis, unspecified: Secondary | ICD-10-CM | POA: Diagnosis not present

## 2014-09-28 DIAGNOSIS — E119 Type 2 diabetes mellitus without complications: Secondary | ICD-10-CM | POA: Diagnosis not present

## 2014-09-28 DIAGNOSIS — H43819 Vitreous degeneration, unspecified eye: Secondary | ICD-10-CM | POA: Diagnosis not present

## 2014-09-28 DIAGNOSIS — H2510 Age-related nuclear cataract, unspecified eye: Secondary | ICD-10-CM | POA: Diagnosis not present

## 2014-09-28 DIAGNOSIS — H4011X2 Primary open-angle glaucoma, moderate stage: Secondary | ICD-10-CM | POA: Diagnosis not present

## 2014-10-05 ENCOUNTER — Ambulatory Visit (HOSPITAL_COMMUNITY)
Admission: RE | Admit: 2014-10-05 | Discharge: 2014-10-05 | Disposition: A | Discharge status: Home / Self Care | Payer: Medicare Other | Source: Ambulatory Visit | Attending: Family Medicine | Admitting: Family Medicine

## 2014-10-05 DIAGNOSIS — Z1231 Encounter for screening mammogram for malignant neoplasm of breast: Secondary | ICD-10-CM | POA: Diagnosis not present

## 2014-10-12 DIAGNOSIS — Z79899 Other long term (current) drug therapy: Secondary | ICD-10-CM | POA: Diagnosis not present

## 2014-10-12 DIAGNOSIS — I10 Essential (primary) hypertension: Secondary | ICD-10-CM | POA: Diagnosis not present

## 2014-10-12 DIAGNOSIS — R600 Localized edema: Secondary | ICD-10-CM | POA: Diagnosis not present

## 2014-10-12 DIAGNOSIS — E119 Type 2 diabetes mellitus without complications: Secondary | ICD-10-CM | POA: Diagnosis not present

## 2014-10-12 DIAGNOSIS — E785 Hyperlipidemia, unspecified: Secondary | ICD-10-CM | POA: Diagnosis not present

## 2014-10-12 DIAGNOSIS — Z23 Encounter for immunization: Secondary | ICD-10-CM | POA: Diagnosis not present

## 2014-10-30 ENCOUNTER — Encounter (HOSPITAL_COMMUNITY): Payer: Self-pay | Admitting: Emergency Medicine

## 2015-01-04 DIAGNOSIS — H5712 Ocular pain, left eye: Secondary | ICD-10-CM | POA: Diagnosis not present

## 2015-01-04 DIAGNOSIS — H4011X2 Primary open-angle glaucoma, moderate stage: Secondary | ICD-10-CM | POA: Diagnosis not present

## 2015-03-29 DIAGNOSIS — Z79899 Other long term (current) drug therapy: Secondary | ICD-10-CM | POA: Diagnosis not present

## 2015-03-29 DIAGNOSIS — L98491 Non-pressure chronic ulcer of skin of other sites limited to breakdown of skin: Secondary | ICD-10-CM | POA: Diagnosis not present

## 2015-03-29 DIAGNOSIS — E785 Hyperlipidemia, unspecified: Secondary | ICD-10-CM | POA: Diagnosis not present

## 2015-03-29 DIAGNOSIS — E119 Type 2 diabetes mellitus without complications: Secondary | ICD-10-CM | POA: Diagnosis not present

## 2015-03-29 DIAGNOSIS — I10 Essential (primary) hypertension: Secondary | ICD-10-CM | POA: Diagnosis not present

## 2015-03-29 DIAGNOSIS — Z0001 Encounter for general adult medical examination with abnormal findings: Secondary | ICD-10-CM | POA: Diagnosis not present

## 2015-04-05 DIAGNOSIS — H4011X2 Primary open-angle glaucoma, moderate stage: Secondary | ICD-10-CM | POA: Diagnosis not present

## 2015-04-24 DIAGNOSIS — E119 Type 2 diabetes mellitus without complications: Secondary | ICD-10-CM | POA: Diagnosis not present

## 2015-04-24 DIAGNOSIS — L97929 Non-pressure chronic ulcer of unspecified part of left lower leg with unspecified severity: Secondary | ICD-10-CM | POA: Diagnosis not present

## 2015-05-08 DIAGNOSIS — E119 Type 2 diabetes mellitus without complications: Secondary | ICD-10-CM | POA: Diagnosis not present

## 2015-05-08 DIAGNOSIS — L98491 Non-pressure chronic ulcer of skin of other sites limited to breakdown of skin: Secondary | ICD-10-CM | POA: Diagnosis not present

## 2015-05-14 DIAGNOSIS — M7540 Impingement syndrome of unspecified shoulder: Secondary | ICD-10-CM | POA: Diagnosis not present

## 2015-05-16 ENCOUNTER — Ambulatory Visit
Admission: RE | Admit: 2015-05-16 | Discharge: 2015-05-16 | Disposition: A | Discharge status: Home / Self Care | Payer: Medicare Other | Source: Ambulatory Visit | Attending: Orthopedic Surgery | Admitting: Orthopedic Surgery

## 2015-05-16 ENCOUNTER — Other Ambulatory Visit: Payer: Self-pay | Admitting: Orthopedic Surgery

## 2015-05-16 DIAGNOSIS — M5412 Radiculopathy, cervical region: Secondary | ICD-10-CM

## 2015-05-16 DIAGNOSIS — M1611 Unilateral primary osteoarthritis, right hip: Secondary | ICD-10-CM | POA: Diagnosis not present

## 2015-05-16 DIAGNOSIS — M1610 Unilateral primary osteoarthritis, unspecified hip: Secondary | ICD-10-CM

## 2015-05-16 DIAGNOSIS — M542 Cervicalgia: Secondary | ICD-10-CM | POA: Diagnosis not present

## 2015-06-04 DIAGNOSIS — I83029 Varicose veins of left lower extremity with ulcer of unspecified site: Secondary | ICD-10-CM | POA: Diagnosis not present

## 2015-06-19 DIAGNOSIS — H4011X2 Primary open-angle glaucoma, moderate stage: Secondary | ICD-10-CM | POA: Diagnosis not present

## 2015-06-19 DIAGNOSIS — E119 Type 2 diabetes mellitus without complications: Secondary | ICD-10-CM | POA: Diagnosis not present

## 2015-06-19 DIAGNOSIS — H2511 Age-related nuclear cataract, right eye: Secondary | ICD-10-CM | POA: Diagnosis not present

## 2015-06-19 DIAGNOSIS — H5712 Ocular pain, left eye: Secondary | ICD-10-CM | POA: Diagnosis not present

## 2015-07-05 DIAGNOSIS — L97809 Non-pressure chronic ulcer of other part of unspecified lower leg with unspecified severity: Secondary | ICD-10-CM | POA: Diagnosis not present

## 2015-07-05 DIAGNOSIS — L03116 Cellulitis of left lower limb: Secondary | ICD-10-CM | POA: Diagnosis not present

## 2015-07-10 ENCOUNTER — Other Ambulatory Visit: Payer: Self-pay | Admitting: Family Medicine

## 2015-07-10 ENCOUNTER — Other Ambulatory Visit (HOSPITAL_COMMUNITY): Payer: Self-pay | Admitting: Interventional Radiology

## 2015-07-10 DIAGNOSIS — L97929 Non-pressure chronic ulcer of unspecified part of left lower leg with unspecified severity: Secondary | ICD-10-CM

## 2015-07-16 DIAGNOSIS — I8310 Varicose veins of unspecified lower extremity with inflammation: Secondary | ICD-10-CM | POA: Diagnosis not present

## 2015-07-16 DIAGNOSIS — L97929 Non-pressure chronic ulcer of unspecified part of left lower leg with unspecified severity: Secondary | ICD-10-CM | POA: Diagnosis not present

## 2015-07-18 ENCOUNTER — Ambulatory Visit
Admission: RE | Admit: 2015-07-18 | Discharge: 2015-07-18 | Disposition: A | Discharge status: Home / Self Care | Payer: Medicare Other | Source: Ambulatory Visit | Attending: Interventional Radiology | Admitting: Interventional Radiology

## 2015-07-18 ENCOUNTER — Ambulatory Visit
Admission: RE | Admit: 2015-07-18 | Discharge: 2015-07-18 | Disposition: A | Discharge status: Home / Self Care | Payer: Medicare Other | Source: Ambulatory Visit | Attending: Family Medicine | Admitting: Family Medicine

## 2015-07-18 DIAGNOSIS — L97929 Non-pressure chronic ulcer of unspecified part of left lower leg with unspecified severity: Secondary | ICD-10-CM

## 2015-07-18 HISTORY — DX: Cellulitis of left lower limb: L03.116

## 2015-07-18 HISTORY — DX: Non-pressure chronic ulcer of unspecified part of unspecified lower leg with unspecified severity: L97.909

## 2015-07-18 NOTE — Consult Note (Signed)
Chief Complaint: Patient was seen in consultation today for No chief complaint on file.  at the request of Dibas Koirala MD. Referring Physician(s): Dibas Koirala MD  History of Present Illness: Jenna [name] is a 81 y.o. female recently treated for medial left calf ulceration and cellulitis, which was slow to heal. She has a remote history of some sort of vein resection in her medial calf. No history of DVT, superficial thrombophlebitis, or pulmonary embolism. No family history of varicose/spider veins. She is not using any compression hose currently. She is retired. She is not using any pain medications. She does note bilateral lower extremity edema. She had left knee replacement surgery approximately 7.5 years ago. She describes left lower extremity tiredness and notes color changes around the ankle.  Past Medical History  Diagnosis Date  . Cancer     endometrial cancer  . Diabetes mellitus   . Arthritis   . Hypertension   . Hyperlipemia   . OAB (overactive bladder)   . Cellulitis of left lower extremity   . Non-healing ulcer of lower leg     Left leg      Past Surgical History  Procedure Laterality Date  . Rotator cuff repair      right arm  . Knee surgery      left knee  . Tubal ligation    . Eye surgery      cataract  . Joint replacement      knee  . Abdominal hysterectomy  02/2008    robotic hyst BSO Bilateral LND  . Laparoscopic cholecystectomy single port N/A 07/04/2014    Procedure: LAPAROSCOPIC CHOLECYSTECTOMY SINGLE PORT;  Surgeon: Adin Hector, MD;  Location: [facility];  Service: General;  Laterality: N/A;    Allergies: Shrimp and Ancef  Medications: Prior to Admission medications   Medication Sig Start Date End Date Taking? Authorizing Provider  amLODipine (NORVASC) 10 MG tablet Take 10 mg by mouth daily.     Yes Historical Provider, MD  aspirin 325 MG tablet Take 325 mg by mouth daily.   Yes Historical Provider, MD  atorvastatin (LIPITOR) 20 MG  tablet Take 20 mg by mouth daily.     Yes Historical Provider, MD  brimonidine-timolol (COMBIGAN) 0.2-0.5 % ophthalmic solution Place 1 drop into both eyes every 12 (twelve) hours.   Yes Historical Provider, MD  glipiZIDE (GLUCOTROL XL) 2.5 MG 24 hr tablet Take 2.5 mg by mouth daily.     Yes Historical Provider, MD  hydrochlorothiazide (HYDRODIURIL) 25 MG tablet Take 25 mg by mouth daily.  05/16/14  Yes Historical Provider, MD  losartan (COZAAR) 100 MG tablet Take 100 mg by mouth daily.  05/16/14  Yes Historical Provider, MD  metFORMIN (GLUCOPHAGE) 1000 MG tablet Take 1,000 mg by mouth 2 (two) times daily with a meal.   Yes Historical Provider, MD  aspirin 81 MG tablet Take 81 mg by mouth daily.    Historical Provider, MD  furosemide (LASIX) 20 MG tablet Take 20 mg by mouth daily as needed for fluid.  05/06/14   Historical Provider, MD  ibuprofen (ADVIL,MOTRIN) 600 MG tablet Take 1 tablet (600 mg total) by mouth every 6 (six) hours as needed (pain). 07/04/14   Michael Boston, MD  latanoprost (XALATAN) 0.005 % ophthalmic solution Place 1 drop into both eyes at bedtime.     Historical Provider, MD  polyethylene glycol (MIRALAX / GLYCOLAX) packet Take 17 g by mouth daily as needed for mild constipation or moderate  constipation. 07/05/14   Earnstine Regal, PA-C     Family History  Problem Relation Age of Onset  . Cancer Brother 51    rectal  . Early death Brother     History   Social History  . Marital Status: Married    Spouse Name: N/A  . Number of Children: N/A  . Years of Education: N/A   Social History Main Topics  . Smoking status: Never Smoker   . Smokeless tobacco: Never Used  . Alcohol Use: No  . Drug Use: No  . Sexual Activity: No   Other Topics Concern  . Not on file   Social History Narrative    ECOG Status: 1 - Symptomatic but completely ambulatory  Review of Systems  Review of Systems: A 12 point ROS discussed and pertinent positives are indicated in the HPI above.  All  other systems are negative.  Vital Signs: There were no vitals taken for this visit.  Physical Exam  Constitutional: She is oriented to person, place, and time. She appears well-developed and well-nourished. No distress.  HENT:  Head: Normocephalic and atraumatic.  Eyes: EOM are normal. Right eye exhibits no discharge. Left eye exhibits no discharge. No scleral icterus.  Neck: Normal range of motion.  Pulmonary/Chest: Effort normal. No stridor. No respiratory distress.  Abdominal: Soft. She exhibits no distension.  Musculoskeletal:       Right ankle: She exhibits swelling.       Left ankle: She exhibits swelling.       Legs: Healed ulceration anterior distal R tibia. Healed ulceration medial distal L calf. Regional skin darkening and discoloration bilaterally.  Neurological: She is alert and oriented to person, place, and time.  Skin: Skin is warm and dry. She is not diaphoretic. No erythema. No pallor.  Psychiatric: She has a normal mood and affect. Her behavior is normal. Judgment and thought content normal.  Nursing note and vitals reviewed.   Mallampati Score:     Imaging: Ultrasound today shows normal deep venous system of the left lower extremity. There is extensive valvular incompetence and reflux through the GSD with direct medication to subcutaneous varicose veins. See full report. Labs:  CBC: No results for input(s): WBC, HGB, HCT, PLT in the last 8760 hours.  COAGS: No results for input(s): INR, APTT in the last 8760 hours.  BMP: No results for input(s): NA, K, CL, CO2, GLUCOSE, BUN, CALCIUM, CREATININE, GFRNONAA, GFRAA in the last 8760 hours.  Invalid input(s): CMP  LIVER FUNCTION TESTS: No results for input(s): BILITOT, AST, ALT, ALKPHOS, PROT, ALBUMIN in the last 8760 hours.  TUMOR MARKERS: No results for input(s): AFPTM, CEA, CA199, CHROMGRNA in the last 8760 hours.  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 I suspect this contributes to her poor wound healing in this region and may also contribute a component to her lower leg swelling, which may be multifactorial. In any case, think she would be an appropriate candidate for intravenous RF closure of the refluxing greater saphenous vein. The  vein's caliber and course would be amenable to this.  We discussed the pathophysiology of superficial venous valvular incompetence, reflux, and varicose veins. We discussed the natural history. We discussed treatment options including watchful waiting, compression hose, endovascular ablation, and surgical vein stripping. We discussed the technique of endovascular ablation, anticipated benefits, time course  of symptom resolution, possible risks and side effects, and the need to wear compression hose during the treatment.. She seemed to understand and was motivated to proceed. I gave her some literature to review, as well as a prescription for 20-30 mmHg thigh-high compression hose. We can set up treatment of this left refluxing greater saphenous vein segment at her convenience, pending carrier approval.  Thank you for this interesting consult.  I greatly enjoyed meeting Jenna [name] and look forward to participating in their care.  A copy of this report was sent to the requesting provider on this date.  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

## 2015-07-19 DIAGNOSIS — N3000 Acute cystitis without hematuria: Secondary | ICD-10-CM | POA: Diagnosis not present

## 2015-07-26 DIAGNOSIS — N3000 Acute cystitis without hematuria: Secondary | ICD-10-CM | POA: Diagnosis not present

## 2015-08-06 ENCOUNTER — Encounter (HOSPITAL_BASED_OUTPATIENT_CLINIC_OR_DEPARTMENT_OTHER): Payer: Medicare Other | Attending: Plastic Surgery

## 2015-08-06 DIAGNOSIS — I872 Venous insufficiency (chronic) (peripheral): Secondary | ICD-10-CM | POA: Insufficient documentation

## 2015-08-06 DIAGNOSIS — E119 Type 2 diabetes mellitus without complications: Secondary | ICD-10-CM | POA: Diagnosis not present

## 2015-08-06 DIAGNOSIS — I10 Essential (primary) hypertension: Secondary | ICD-10-CM | POA: Insufficient documentation

## 2015-09-26 ENCOUNTER — Other Ambulatory Visit: Payer: Self-pay

## 2015-09-26 DIAGNOSIS — Z1231 Encounter for screening mammogram for malignant neoplasm of breast: Secondary | ICD-10-CM

## 2015-10-08 DIAGNOSIS — Z23 Encounter for immunization: Secondary | ICD-10-CM | POA: Diagnosis not present

## 2015-10-08 DIAGNOSIS — R062 Wheezing: Secondary | ICD-10-CM | POA: Diagnosis not present

## 2015-10-08 DIAGNOSIS — Z5181 Encounter for therapeutic drug level monitoring: Secondary | ICD-10-CM | POA: Diagnosis not present

## 2015-10-08 DIAGNOSIS — E119 Type 2 diabetes mellitus without complications: Secondary | ICD-10-CM | POA: Diagnosis not present

## 2015-10-08 DIAGNOSIS — I10 Essential (primary) hypertension: Secondary | ICD-10-CM | POA: Diagnosis not present

## 2015-10-10 DIAGNOSIS — M5136 Other intervertebral disc degeneration, lumbar region: Secondary | ICD-10-CM | POA: Diagnosis not present

## 2015-10-16 ENCOUNTER — Ambulatory Visit
Admission: RE | Admit: 2015-10-16 | Discharge: 2015-10-16 | Disposition: A | Discharge status: Home / Self Care | Payer: Medicare Other | Source: Ambulatory Visit

## 2015-10-16 DIAGNOSIS — Z1231 Encounter for screening mammogram for malignant neoplasm of breast: Secondary | ICD-10-CM | POA: Diagnosis not present

## 2015-10-19 DIAGNOSIS — H2511 Age-related nuclear cataract, right eye: Secondary | ICD-10-CM | POA: Diagnosis not present

## 2015-10-19 DIAGNOSIS — H401132 Primary open-angle glaucoma, bilateral, moderate stage: Secondary | ICD-10-CM | POA: Diagnosis not present

## 2015-10-19 DIAGNOSIS — H43813 Vitreous degeneration, bilateral: Secondary | ICD-10-CM | POA: Diagnosis not present

## 2015-10-23 DIAGNOSIS — M47816 Spondylosis without myelopathy or radiculopathy, lumbar region: Secondary | ICD-10-CM | POA: Diagnosis not present

## 2015-10-23 DIAGNOSIS — M47817 Spondylosis without myelopathy or radiculopathy, lumbosacral region: Secondary | ICD-10-CM | POA: Diagnosis not present

## 2015-10-23 DIAGNOSIS — M5136 Other intervertebral disc degeneration, lumbar region: Secondary | ICD-10-CM | POA: Diagnosis not present

## 2015-10-26 DIAGNOSIS — M5136 Other intervertebral disc degeneration, lumbar region: Secondary | ICD-10-CM | POA: Diagnosis not present

## 2015-12-17 DIAGNOSIS — Z01419 Encounter for gynecological examination (general) (routine) without abnormal findings: Secondary | ICD-10-CM | POA: Diagnosis not present

## 2015-12-17 DIAGNOSIS — R32 Unspecified urinary incontinence: Secondary | ICD-10-CM | POA: Diagnosis not present

## 2015-12-17 DIAGNOSIS — C541 Malignant neoplasm of endometrium: Secondary | ICD-10-CM | POA: Diagnosis not present

## 2015-12-17 DIAGNOSIS — Z6841 Body Mass Index (BMI) 40.0 and over, adult: Secondary | ICD-10-CM | POA: Diagnosis not present

## 2016-01-22 DIAGNOSIS — Z86010 Personal history of colonic polyps: Secondary | ICD-10-CM | POA: Diagnosis not present

## 2016-01-22 DIAGNOSIS — Z800 Family history of malignant neoplasm of digestive organs: Secondary | ICD-10-CM | POA: Diagnosis not present

## 2016-01-29 DIAGNOSIS — H401131 Primary open-angle glaucoma, bilateral, mild stage: Secondary | ICD-10-CM | POA: Diagnosis not present

## 2016-01-29 DIAGNOSIS — H04123 Dry eye syndrome of bilateral lacrimal glands: Secondary | ICD-10-CM | POA: Diagnosis not present

## 2016-03-05 DIAGNOSIS — K5730 Diverticulosis of large intestine without perforation or abscess without bleeding: Secondary | ICD-10-CM | POA: Diagnosis not present

## 2016-03-05 DIAGNOSIS — Z800 Family history of malignant neoplasm of digestive organs: Secondary | ICD-10-CM | POA: Diagnosis not present

## 2016-03-05 DIAGNOSIS — Z86010 Personal history of colonic polyps: Secondary | ICD-10-CM | POA: Diagnosis not present

## 2016-03-31 DIAGNOSIS — E785 Hyperlipidemia, unspecified: Secondary | ICD-10-CM | POA: Diagnosis not present

## 2016-03-31 DIAGNOSIS — I10 Essential (primary) hypertension: Secondary | ICD-10-CM | POA: Diagnosis not present

## 2016-03-31 DIAGNOSIS — E119 Type 2 diabetes mellitus without complications: Secondary | ICD-10-CM | POA: Diagnosis not present

## 2016-03-31 DIAGNOSIS — R600 Localized edema: Secondary | ICD-10-CM | POA: Diagnosis not present

## 2016-03-31 DIAGNOSIS — Z79899 Other long term (current) drug therapy: Secondary | ICD-10-CM | POA: Diagnosis not present

## 2016-03-31 DIAGNOSIS — Z7984 Long term (current) use of oral hypoglycemic drugs: Secondary | ICD-10-CM | POA: Diagnosis not present

## 2016-03-31 DIAGNOSIS — E7800 Pure hypercholesterolemia, unspecified: Secondary | ICD-10-CM | POA: Diagnosis not present

## 2016-03-31 DIAGNOSIS — Z0001 Encounter for general adult medical examination with abnormal findings: Secondary | ICD-10-CM | POA: Diagnosis not present

## 2016-04-16 DIAGNOSIS — N390 Urinary tract infection, site not specified: Secondary | ICD-10-CM | POA: Diagnosis not present

## 2016-06-02 DIAGNOSIS — L03119 Cellulitis of unspecified part of limb: Secondary | ICD-10-CM | POA: Diagnosis not present

## 2016-06-02 DIAGNOSIS — R600 Localized edema: Secondary | ICD-10-CM | POA: Diagnosis not present

## 2016-06-02 DIAGNOSIS — I10 Essential (primary) hypertension: Secondary | ICD-10-CM | POA: Diagnosis not present

## 2016-06-27 DIAGNOSIS — L03116 Cellulitis of left lower limb: Secondary | ICD-10-CM | POA: Diagnosis not present

## 2016-06-27 DIAGNOSIS — I10 Essential (primary) hypertension: Secondary | ICD-10-CM | POA: Diagnosis not present

## 2016-06-27 DIAGNOSIS — Z79899 Other long term (current) drug therapy: Secondary | ICD-10-CM | POA: Diagnosis not present

## 2016-06-27 DIAGNOSIS — E119 Type 2 diabetes mellitus without complications: Secondary | ICD-10-CM | POA: Diagnosis not present

## 2016-06-27 DIAGNOSIS — R600 Localized edema: Secondary | ICD-10-CM | POA: Diagnosis not present

## 2016-06-27 DIAGNOSIS — Z7984 Long term (current) use of oral hypoglycemic drugs: Secondary | ICD-10-CM | POA: Diagnosis not present

## 2016-06-29 ENCOUNTER — Encounter (HOSPITAL_COMMUNITY): Payer: Self-pay | Admitting: *Deleted

## 2016-06-29 ENCOUNTER — Emergency Department (HOSPITAL_BASED_OUTPATIENT_CLINIC_OR_DEPARTMENT_OTHER)
Admit: 2016-06-29 | Discharge: 2016-06-29 | Disposition: A | Discharge status: Home / Self Care | Payer: Medicare Other | Attending: Emergency Medicine | Admitting: Emergency Medicine

## 2016-06-29 ENCOUNTER — Emergency Department (HOSPITAL_COMMUNITY)
Admission: EM | Admit: 2016-06-29 | Discharge: 2016-06-29 | Disposition: A | Discharge status: Home / Self Care | Payer: Medicare Other | Attending: Emergency Medicine | Admitting: Emergency Medicine

## 2016-06-29 DIAGNOSIS — E785 Hyperlipidemia, unspecified: Secondary | ICD-10-CM | POA: Diagnosis not present

## 2016-06-29 DIAGNOSIS — M79609 Pain in unspecified limb: Secondary | ICD-10-CM

## 2016-06-29 DIAGNOSIS — Z7984 Long term (current) use of oral hypoglycemic drugs: Secondary | ICD-10-CM | POA: Diagnosis not present

## 2016-06-29 DIAGNOSIS — I10 Essential (primary) hypertension: Secondary | ICD-10-CM | POA: Diagnosis not present

## 2016-06-29 DIAGNOSIS — Z79899 Other long term (current) drug therapy: Secondary | ICD-10-CM | POA: Insufficient documentation

## 2016-06-29 DIAGNOSIS — Z8542 Personal history of malignant neoplasm of other parts of uterus: Secondary | ICD-10-CM | POA: Diagnosis not present

## 2016-06-29 DIAGNOSIS — M1990 Unspecified osteoarthritis, unspecified site: Secondary | ICD-10-CM | POA: Diagnosis not present

## 2016-06-29 DIAGNOSIS — L03116 Cellulitis of left lower limb: Secondary | ICD-10-CM

## 2016-06-29 DIAGNOSIS — E119 Type 2 diabetes mellitus without complications: Secondary | ICD-10-CM | POA: Insufficient documentation

## 2016-06-29 DIAGNOSIS — M7989 Other specified soft tissue disorders: Secondary | ICD-10-CM

## 2016-06-29 DIAGNOSIS — Z7982 Long term (current) use of aspirin: Secondary | ICD-10-CM | POA: Insufficient documentation

## 2016-06-29 MED ORDER — NAPROXEN 375 MG PO TABS: 375.0000 mg | ORAL_TABLET | Freq: Two times a day (BID) | ORAL | Status: DC

## 2016-06-29 MED ORDER — CLINDAMYCIN HCL 300 MG PO CAPS: 300.0000 mg | ORAL_CAPSULE | Freq: Three times a day (TID) | ORAL | Status: DC

## 2016-06-29 MED ORDER — CLINDAMYCIN HCL 300 MG PO CAPS
300.0000 mg | ORAL_CAPSULE | Freq: Once | ORAL | Status: AC
  Administered 2016-06-29: 300 mg via ORAL
  Filled 2016-06-29: qty 1

## 2016-06-29 MED ORDER — IBUPROFEN 200 MG PO TABS
400.0000 mg | ORAL_TABLET | Freq: Once | ORAL | Status: AC
  Administered 2016-06-29: 400 mg via ORAL
  Filled 2016-06-29: qty 2

## 2016-06-29 NOTE — ED Notes (Signed)
Ultrasound PRESENT

## 2016-06-29 NOTE — ED Provider Notes (Signed)
CSN: [id number]     Arrival date & time 06/29/16  1347 History   First MD Initiated Contact with Patient 06/29/16 1404     Chief Complaint  Patient presents with  . Leg Swelling     (Consider location/radiation/quality/duration/timing/severity/associated sxs/prior Treatment) HPI Comments: Patient is an 82 year old female with a history of diabetes, hypertension, hyperlipidemia and chronic lower extremity edema with poorly healing wounds and cellulitis of the left lower extremity. She states for the last 2 years she's had intermittent problems with her left lower extremity. She was scheduled to have a vein ablation for a refluxing left saphenous vein 2 years ago but because of family tragedies that she was not able to proceed with that. She saw her doctor 2 weeks ago for worsening redness and some mild drainage over a prior wound that had healed. She was started on prednisone and doxycycline with improvement of her symptoms. However when she stopped the medication symptoms started to return. She recently started back on his second course of doxycycline which he is still currently taking but the redness, warmth and pain are worsening. She notes significant swelling in the left lower extremity. She is also complaining of calf tenderness. No numbness or tingling. Pain is now 9 out of 10 and sharp in nature. No prior history of DVT. She denies any recent surgeries.  The history is provided by the patient.    Past Medical History  Diagnosis Date  . Diabetes mellitus   . Arthritis   . Hypertension   . Hyperlipemia   . OAB (overactive bladder)   . Cellulitis of left lower extremity   . Non-healing ulcer of lower leg (HCC)     Left leg    . Cancer [facility])     endometrial cancer   Past Surgical History  Procedure Laterality Date  . Rotator cuff repair      right arm  . Knee surgery      left knee  . Tubal ligation    . Eye surgery      cataract  . Joint replacement      knee  . Abdominal  hysterectomy  02/2008    robotic hyst BSO Bilateral LND  . Laparoscopic cholecystectomy single port N/A 07/04/2014    Procedure: LAPAROSCOPIC CHOLECYSTECTOMY SINGLE PORT;  Surgeon: Adin Hector, MD;  Location: [facility];  Service: General;  Laterality: N/A;   Family History  Problem Relation Age of Onset  . Cancer Brother 60    rectal  . Early death Brother    Social History  Substance Use Topics  . Smoking status: Never Smoker   . Smokeless tobacco: Never Used  . Alcohol Use: No   OB History    Gravida Para Term Preterm AB TAB SAB Ectopic Multiple Living   7 7 7             Review of Systems  All other systems reviewed and are negative.     Allergies  Shrimp and Ancef  Home Medications   Prior to Admission medications   Medication Sig Start Date End Date Taking? Authorizing Provider  amLODipine (NORVASC) 10 MG tablet Take 10 mg by mouth daily.      Historical Provider, MD  aspirin 325 MG tablet Take 325 mg by mouth daily.    Historical Provider, MD  aspirin 81 MG tablet Take 81 mg by mouth daily.    Historical Provider, MD  atorvastatin (LIPITOR) 20 MG tablet Take 20 mg by mouth daily.  Historical Provider, MD  brimonidine-timolol (COMBIGAN) 0.2-0.5 % ophthalmic solution Place 1 drop into both eyes every 12 (twelve) hours.    Historical Provider, MD  furosemide (LASIX) 20 MG tablet Take 20 mg by mouth daily as needed for fluid.  05/06/14   Historical Provider, MD  glipiZIDE (GLUCOTROL XL) 2.5 MG 24 hr tablet Take 2.5 mg by mouth daily.      Historical Provider, MD  hydrochlorothiazide (HYDRODIURIL) 25 MG tablet Take 25 mg by mouth daily.  05/16/14   Historical Provider, MD  ibuprofen (ADVIL,MOTRIN) 600 MG tablet Take 1 tablet (600 mg total) by mouth every 6 (six) hours as needed (pain). 07/04/14   Michael Boston, MD  latanoprost (XALATAN) 0.005 % ophthalmic solution Place 1 drop into both eyes at bedtime.     Historical Provider, MD  losartan (COZAAR) 100 MG tablet Take 100 mg  by mouth daily.  05/16/14   Historical Provider, MD  metFORMIN (GLUCOPHAGE) 1000 MG tablet Take 1,000 mg by mouth 2 (two) times daily with a meal.    Historical Provider, MD  polyethylene glycol (MIRALAX / GLYCOLAX) packet Take 17 g by mouth daily as needed for mild constipation or moderate constipation. 07/05/14   Earnstine Regal, PA-C   BP 166/81 mmHg  Pulse 98  Temp(Src) 98.1 F (36.7 C) (Oral)  Resp 22  SpO2 99% Physical Exam  Constitutional: She is oriented to person, place, and time. She appears well-developed and well-nourished. No distress.  HENT:  Head: Normocephalic and atraumatic.  Eyes: EOM are normal. Pupils are equal, round, and reactive to light.  Cardiovascular: Normal rate, regular rhythm, normal heart sounds and intact distal pulses.  Exam reveals no friction rub.   No murmur heard. Pulmonary/Chest: Effort normal and breath sounds normal. She has no wheezes. She has no rales.  Musculoskeletal: Normal range of motion. She exhibits tenderness.       Legs: Left medial calf tenderness. 1+ DP pulse present in the left foot. Normal function of the toes and less than 3 second capillary refill.  Edema present in the left leg compared to the right  Neurological: She is alert and oriented to person, place, and time. No cranial nerve deficit.  Skin: Skin is warm and dry. No rash noted.  Psychiatric: She has a normal mood and affect. Her behavior is normal.  Nursing note and vitals reviewed.   ED Course  Procedures (including critical care time) Labs Review Labs Reviewed - No data to display  Imaging Review No results found. I have personally reviewed and evaluated these images and lab results as part of my medical decision-making.  VASCULAR LAB PRELIMINARY PRELIMINARY PRELIMINARY PRELIMINARY  Left lower extremity venous duplex completed.   Preliminary report: There is no obvious evidence of DVT or SVT noted in the visualized veins of the left lower  extremity.  KANADY, CANDACE, RVT 06/29/2016, 4:39 PM  MDM   Final diagnoses:  Cellulitis of left lower leg    Patient is an 82 year old female with a past medical history of poor wound healing on the left lower extremity, cellulitis and edema for the past several years. 2 years ago she was seen by interventional radiology and that time had a refluxing left saphenous vein that they were going to ablate however because of family issues she was not able to proceed. In the last 2 weeks she's noted worsening swelling, redness and mild drainage of the left leg. She initially was on a course of doxycycline and prednisone with improvement of  her symptoms they may return this week. She restarted the doxycycline but symptoms are not improving. She is wearing compression hose and taking the antibiotic as prescribed. She denies any fever, chills, appetite changes, nausea or vomiting but the leg pain is getting worse. Left leg is now much more swollen than the right. She has no prior history of DVT. On exam patient's left lower extremity is swollen, erythematous from the ankle to just below the knee and warm. Area on the medial aspect of the distal tibia where she had had a prior wound has some minimal drainage.  1+ DP pulse with less than 3 second capillary refill. Normal function and sensation of the toes. Patient does have calf tenderness. Will do an ultrasound to rule out DVT. If that is negative will change patient from doxycycline to clindamycin. Will give her follow-up with interventional radiology to possibly proceed with the procedure but also recommended she follow-up with her PCP on Wednesday.     Blanchie Dessert, MD 06/29/16 [phone]

## 2016-06-29 NOTE — ED Notes (Signed)
MD at bedside. 

## 2016-06-29 NOTE — ED Notes (Signed)
Pt requested wheelchair at discharge

## 2016-06-29 NOTE — Progress Notes (Signed)
VASCULAR LAB PRELIMINARY  PRELIMINARY  PRELIMINARY  PRELIMINARY  Left lower extremity venous duplex completed.    Preliminary report:  There is no obvious evidence of DVT or SVT noted in the visualized veins of the left lower extremity.  Kyrene Longan, RVT 06/29/2016, 4:39 PM

## 2016-06-29 NOTE — ED Notes (Addendum)
Pt and family made aware Vascular had one pt and would be next.

## 2016-06-29 NOTE — ED Notes (Addendum)
Patient c/o left lower leg/calf swelling and pain for "years," but worsening over past 2 weeks.  Her PCP prescribed prednisone.  Patient's PCP also prescribed Doxycycline and she has had 2 rounds of that, the most recent starting this past Friday.  Patient was supposed to have a stent placed in LLE last October, but did not follow-through with the procedure because of family illness.  Patient has also worn compression hose and something that sounds like an una boot on her LLE in past several years.  Patient states the pain today is "unbearable."

## 2016-06-29 NOTE — Discharge Instructions (Signed)

## 2016-07-03 DIAGNOSIS — L03116 Cellulitis of left lower limb: Secondary | ICD-10-CM | POA: Diagnosis not present

## 2016-07-07 ENCOUNTER — Inpatient Hospital Stay (HOSPITAL_COMMUNITY)
Admission: EM | Admit: 2016-07-07 | Discharge: 2016-07-09 | DRG: 603 | Disposition: A | Discharge status: Home / Self Care | Payer: Medicare Other | Attending: Internal Medicine | Admitting: Internal Medicine

## 2016-07-07 ENCOUNTER — Encounter (HOSPITAL_COMMUNITY): Payer: Self-pay | Admitting: Emergency Medicine

## 2016-07-07 DIAGNOSIS — L03116 Cellulitis of left lower limb: Principal | ICD-10-CM | POA: Diagnosis present

## 2016-07-07 DIAGNOSIS — E11622 Type 2 diabetes mellitus with other skin ulcer: Secondary | ICD-10-CM | POA: Diagnosis present

## 2016-07-07 DIAGNOSIS — Z800 Family history of malignant neoplasm of digestive organs: Secondary | ICD-10-CM | POA: Diagnosis not present

## 2016-07-07 DIAGNOSIS — I10 Essential (primary) hypertension: Secondary | ICD-10-CM | POA: Diagnosis not present

## 2016-07-07 DIAGNOSIS — L0390 Cellulitis, unspecified: Secondary | ICD-10-CM | POA: Diagnosis not present

## 2016-07-07 DIAGNOSIS — Z881 Allergy status to other antibiotic agents status: Secondary | ICD-10-CM

## 2016-07-07 DIAGNOSIS — Z888 Allergy status to other drugs, medicaments and biological substances status: Secondary | ICD-10-CM | POA: Diagnosis not present

## 2016-07-07 DIAGNOSIS — M7989 Other specified soft tissue disorders: Secondary | ICD-10-CM | POA: Diagnosis not present

## 2016-07-07 DIAGNOSIS — R609 Edema, unspecified: Secondary | ICD-10-CM | POA: Diagnosis present

## 2016-07-07 DIAGNOSIS — Z7982 Long term (current) use of aspirin: Secondary | ICD-10-CM | POA: Diagnosis not present

## 2016-07-07 DIAGNOSIS — L97929 Non-pressure chronic ulcer of unspecified part of left lower leg with unspecified severity: Secondary | ICD-10-CM | POA: Diagnosis present

## 2016-07-07 DIAGNOSIS — E119 Type 2 diabetes mellitus without complications: Secondary | ICD-10-CM | POA: Diagnosis not present

## 2016-07-07 DIAGNOSIS — I878 Other specified disorders of veins: Secondary | ICD-10-CM | POA: Diagnosis present

## 2016-07-07 DIAGNOSIS — E785 Hyperlipidemia, unspecified: Secondary | ICD-10-CM | POA: Diagnosis present

## 2016-07-07 DIAGNOSIS — Z91013 Allergy to seafood: Secondary | ICD-10-CM | POA: Diagnosis not present

## 2016-07-07 DIAGNOSIS — Z8542 Personal history of malignant neoplasm of other parts of uterus: Secondary | ICD-10-CM

## 2016-07-07 DIAGNOSIS — I83029 Varicose veins of left lower extremity with ulcer of unspecified site: Secondary | ICD-10-CM | POA: Diagnosis present

## 2016-07-07 DIAGNOSIS — N3281 Overactive bladder: Secondary | ICD-10-CM | POA: Diagnosis present

## 2016-07-07 DIAGNOSIS — Z7984 Long term (current) use of oral hypoglycemic drugs: Secondary | ICD-10-CM | POA: Diagnosis not present

## 2016-07-07 DIAGNOSIS — E118 Type 2 diabetes mellitus with unspecified complications: Secondary | ICD-10-CM

## 2016-07-07 DIAGNOSIS — Z79899 Other long term (current) drug therapy: Secondary | ICD-10-CM | POA: Diagnosis not present

## 2016-07-07 LAB — COMPREHENSIVE METABOLIC PANEL
ALBUMIN: 3.9 g/dL (ref 3.5–5.0)
ALT: 17 U/L (ref 14–54)
ANION GAP: 7 (ref 5–15)
AST: 19 U/L (ref 15–41)
Alkaline Phosphatase: 107 U/L (ref 38–126)
BUN: 12 mg/dL (ref 6–20)
CO2: 26 mmol/L (ref 22–32)
Calcium: 8.8 mg/dL — ABNORMAL LOW (ref 8.9–10.3)
Chloride: 106 mmol/L (ref 101–111)
Creatinine, Ser: 0.62 mg/dL (ref 0.44–1.00)
GFR calc Af Amer: >60 mL/min (ref >60)
GFR calc non Af Amer: >60 mL/min (ref >60)
GLUCOSE: 85 mg/dL (ref 65–99)
Potassium: 3.9 mmol/L (ref 3.5–5.1)
Sodium: 139 mmol/L (ref 135–145)
Total Bilirubin: 0.6 mg/dL (ref 0.3–1.2)
Total Protein: 7.2 g/dL (ref 6.5–8.1)

## 2016-07-07 LAB — CBC WITH DIFFERENTIAL/PLATELET
BASOS PCT: 1 %
Basophils Absolute: 0 10*3/uL (ref 0.0–0.1)
EOS ABS: 0.2 10*3/uL (ref 0.0–0.7)
Eosinophils Relative: 3 %
HCT: 37.1 % (ref 36.0–46.0)
HEMOGLOBIN: 12 g/dL (ref 12.0–15.0)
Lymphocytes Relative: 27 %
Lymphs Abs: 1.7 10*3/uL (ref 0.7–4.0)
MCH: 26.9 pg (ref 26.0–34.0)
MCHC: 32.3 g/dL (ref 30.0–36.0)
MCV: 83.2 fL (ref 78.0–100.0)
MONO ABS: 0.6 10*3/uL (ref 0.1–1.0)
Monocytes Relative: 9 %
NEUTROS PCT: 60 %
Neutro Abs: 3.8 10*3/uL (ref 1.7–7.7)
PLATELETS: 298 10*3/uL (ref 150–400)
RBC: 4.46 MIL/uL (ref 3.87–5.11)
RDW: 14.6 % (ref 11.5–15.5)
WBC: 6.4 10*3/uL (ref 4.0–10.5)

## 2016-07-07 LAB — I-STAT CG4 LACTIC ACID, ED
Lactic Acid, Venous: 0.69 mmol/L (ref 0.5–1.9)
Lactic Acid, Venous: 0.77 mmol/L (ref 0.5–1.9)

## 2016-07-07 MED ORDER — ASPIRIN 325 MG PO TABS: 325.0000 mg | ORAL_TABLET | Freq: Every day | ORAL | Status: DC

## 2016-07-07 MED ORDER — VANCOMYCIN HCL 10 G IV SOLR
2000.0000 mg | Freq: Once | INTRAVENOUS | Status: AC
  Administered 2016-07-07: 2000 mg via INTRAVENOUS
  Filled 2016-07-07: qty 2000

## 2016-07-07 MED ORDER — LATANOPROST 0.005 % OP SOLN
1.0000 [drp] | Freq: Every day | OPHTHALMIC | Status: DC
  Filled 2016-07-07: qty 2.5

## 2016-07-07 MED ORDER — TIMOLOL MALEATE 0.5 % OP SOLN
1.0000 [drp] | Freq: Two times a day (BID) | OPHTHALMIC | Status: DC
  Filled 2016-07-07: qty 5

## 2016-07-07 MED ORDER — AMLODIPINE BESYLATE 10 MG PO TABS: 10.0000 mg | ORAL_TABLET | Freq: Every day | ORAL | Status: DC

## 2016-07-07 MED ORDER — LOSARTAN POTASSIUM 50 MG PO TABS: 100.0000 mg | ORAL_TABLET | Freq: Every day | ORAL | Status: DC

## 2016-07-07 MED ORDER — HYDROCHLOROTHIAZIDE 25 MG PO TABS
25.0000 mg | ORAL_TABLET | Freq: Every day | ORAL | Status: DC
  Administered 2016-07-08 – 2016-07-09 (×2): 25 mg via ORAL
  Filled 2016-07-07 (×2): qty 1

## 2016-07-07 MED ORDER — ATORVASTATIN CALCIUM 10 MG PO TABS
20.0000 mg | ORAL_TABLET | Freq: Every day | ORAL | Status: DC
  Administered 2016-07-08 – 2016-07-09 (×2): 20 mg via ORAL
  Filled 2016-07-07 (×2): qty 2

## 2016-07-07 MED ORDER — BRIMONIDINE TARTRATE-TIMOLOL 0.2-0.5 % OP SOLN: 1.0000 [drp] | Freq: Two times a day (BID) | OPHTHALMIC | Status: DC

## 2016-07-07 MED ORDER — AMLODIPINE BESYLATE 10 MG PO TABS
10.0000 mg | ORAL_TABLET | Freq: Every day | ORAL | Status: DC
  Administered 2016-07-08 – 2016-07-09 (×2): 10 mg via ORAL
  Filled 2016-07-07 (×2): qty 1

## 2016-07-07 MED ORDER — ENOXAPARIN SODIUM 40 MG/0.4ML ~~LOC~~ SOLN
40.0000 mg | SUBCUTANEOUS | Status: DC
  Administered 2016-07-07: 40 mg via SUBCUTANEOUS
  Filled 2016-07-07: qty 0.4

## 2016-07-07 MED ORDER — FESOTERODINE FUMARATE ER 8 MG PO TB24
8.0000 mg | ORAL_TABLET | Freq: Every day | ORAL | Status: DC
  Administered 2016-07-08 – 2016-07-09 (×2): 8 mg via ORAL
  Filled 2016-07-07 (×2): qty 1

## 2016-07-07 MED ORDER — LOSARTAN POTASSIUM 50 MG PO TABS
100.0000 mg | ORAL_TABLET | Freq: Every day | ORAL | Status: DC
  Administered 2016-07-08 – 2016-07-09 (×2): 100 mg via ORAL
  Filled 2016-07-07 (×2): qty 2

## 2016-07-07 MED ORDER — BRIMONIDINE TARTRATE 0.2 % OP SOLN
1.0000 [drp] | Freq: Two times a day (BID) | OPHTHALMIC | Status: DC
  Administered 2016-07-07 – 2016-07-09 (×4): 1 [drp] via OPHTHALMIC
  Filled 2016-07-07: qty 5

## 2016-07-07 MED ORDER — ATORVASTATIN CALCIUM 20 MG PO TABS: 20.0000 mg | ORAL_TABLET | Freq: Every day | ORAL | Status: DC

## 2016-07-07 MED ORDER — METFORMIN HCL 500 MG PO TABS
1000.0000 mg | ORAL_TABLET | Freq: Two times a day (BID) | ORAL | Status: DC
  Administered 2016-07-08 – 2016-07-09 (×3): 1000 mg via ORAL
  Filled 2016-07-07 (×3): qty 2

## 2016-07-07 MED ORDER — HYDROCHLOROTHIAZIDE 25 MG PO TABS: 25.0000 mg | ORAL_TABLET | Freq: Every day | ORAL | Status: DC

## 2016-07-07 MED ORDER — ASPIRIN 325 MG PO TABS
325.0000 mg | ORAL_TABLET | Freq: Every day | ORAL | Status: DC
  Administered 2016-07-08 – 2016-07-09 (×2): 325 mg via ORAL
  Filled 2016-07-07 (×2): qty 1

## 2016-07-07 MED ORDER — NAPROXEN 375 MG PO TABS
375.0000 mg | ORAL_TABLET | Freq: Two times a day (BID) | ORAL | Status: DC
  Administered 2016-07-07 – 2016-07-09 (×4): 375 mg via ORAL
  Filled 2016-07-07 (×4): qty 1

## 2016-07-07 MED ORDER — GLIPIZIDE ER 2.5 MG PO TB24: 2.5000 mg | ORAL_TABLET | Freq: Every day | ORAL | Status: DC

## 2016-07-07 MED ORDER — GLIPIZIDE ER 2.5 MG PO TB24
2.5000 mg | ORAL_TABLET | Freq: Every day | ORAL | Status: DC
  Filled 2016-07-07: qty 1

## 2016-07-07 NOTE — H&P (Signed)
History and Physical    Jenna [name] [id number] DOB: August 29, 1931 DOA: 07/07/2016   PCP: Lujean Amel, MD Chief Complaint:  Chief Complaint  Patient presents with  . Cellulitis    HPI: Jenna [name] is a 82 y.o. female with medical history significant of Venous stasis ulceration and skin changes of LLE, seeing vascular surgery and actually was supposed to have a vein destruction procedure of some sort last fall, but didn't due to family issues.  Patient has had worsening swelling, pain, erythema, edema, to LLE ankle.  Onset some 10 days ago.  Failed outpatient doxy and clindamycin with only slight improvement to symptoms.  ED Course: Patient started on vancomycin.  Review of Systems: As per HPI otherwise 10 point review of systems negative.    Past Medical History  Diagnosis Date  . Diabetes mellitus   . Arthritis   . Hypertension   . Hyperlipemia   . OAB (overactive bladder)   . Cellulitis of left lower extremity   . Non-healing ulcer of lower leg (HCC)     Left leg    . Cancer [facility])     endometrial cancer    Past Surgical History  Procedure Laterality Date  . Rotator cuff repair      right arm  . Knee surgery      left knee  . Tubal ligation    . Eye surgery      cataract  . Joint replacement      knee  . Abdominal hysterectomy  02/2008    robotic hyst BSO Bilateral LND  . Laparoscopic cholecystectomy single port N/A 07/04/2014    Procedure: LAPAROSCOPIC CHOLECYSTECTOMY SINGLE PORT;  Surgeon: Adin Hector, MD;  Location: [facility];  Service: General;  Laterality: N/A;     reports that she has never smoked. She has never used smokeless tobacco. She reports that she does not drink alcohol or use illicit drugs.  Allergies  Allergen Reactions  . Shrimp [Shellfish Allergy] Anaphylaxis  . Ancef [Cefazolin] Nausea And Vomiting    Family History  Problem Relation Age of Onset  . Cancer Brother 69    rectal  . Early death Brother       Prior to  Admission medications   Medication Sig Start Date End Date Taking? Authorizing Provider  amLODipine (NORVASC) 10 MG tablet Take 10 mg by mouth daily.     Yes Historical Provider, MD  aspirin 325 MG tablet Take 325 mg by mouth daily.   Yes Historical Provider, MD  atorvastatin (LIPITOR) 20 MG tablet Take 20 mg by mouth daily.     Yes Historical Provider, MD  brimonidine-timolol (COMBIGAN) 0.2-0.5 % ophthalmic solution Place 1 drop into both eyes every 12 (twelve) hours.   Yes Historical Provider, MD  glipiZIDE (GLUCOTROL XL) 2.5 MG 24 hr tablet Take 2.5 mg by mouth daily.     Yes Historical Provider, MD  hydrochlorothiazide (HYDRODIURIL) 25 MG tablet Take 25 mg by mouth daily.  05/16/14  Yes Historical Provider, MD  latanoprost (XALATAN) 0.005 % ophthalmic solution Place 1 drop into both eyes at bedtime.    Yes Historical Provider, MD  losartan (COZAAR) 100 MG tablet Take 100 mg by mouth daily.  05/16/14  Yes Historical Provider, MD  metFORMIN (GLUCOPHAGE) 1000 MG tablet Take 1,000 mg by mouth 2 (two) times daily with a meal.   Yes Historical Provider, MD  tolterodine (DETROL LA) 4 MG 24 hr capsule Take 4 mg by mouth daily. 05/12/16  Yes  Historical Provider, MD  naproxen (NAPROSYN) 375 MG tablet Take 1 tablet (375 mg total) by mouth 2 (two) times daily. Patient not taking: Reported on 07/07/2016 06/29/16   Blanchie Dessert, MD    Physical Exam: Filed Vitals:   07/07/16 1440 07/07/16 1716 07/07/16 1956  BP: 166/78 156/79 164/65  Pulse: 91 69 71  Temp: 98.3 F (36.8 C)    TempSrc: Oral    Resp: 18 18 18   SpO2: 98% 97% 97%      Constitutional: NAD, calm, comfortable Eyes: PERRL, lids and conjunctivae normal ENMT: Mucous membranes are moist. Posterior pharynx clear of any exudate or lesions.Normal dentition.  Neck: normal, supple, no masses, no thyromegaly Respiratory: clear to auscultation bilaterally, no wheezing, no crackles. Normal respiratory effort. No accessory muscle use.    Cardiovascular: Regular rate and rhythm, no murmurs / rubs / gallops. No extremity edema. 2+ pedal pulses. No carotid bruits.  Abdomen: no tenderness, no masses palpated. No hepatosplenomegaly. Bowel sounds positive.  Musculoskeletal: no clubbing / cyanosis. No joint deformity upper and lower extremities. Good ROM, no contractures. Normal muscle tone.  Skin: Erythema, tenderness, and skin changes on LLE Neurologic: CN 2-12 grossly intact. Sensation intact, DTR normal. Strength 5/5 in all 4.  Psychiatric: Normal judgment and insight. Alert and oriented x 3. Normal mood.    Labs on Admission: I have personally reviewed following labs and imaging studies  CBC:  Recent Labs Lab 07/07/16 1747  WBC 6.4  NEUTROABS 3.8  HGB 12.0  HCT 37.1  MCV 83.2  PLT [id number]   Basic Metabolic Panel:  Recent Labs Lab 07/07/16 1747  NA 139  K 3.9  CL 106  CO2 26  GLUCOSE 85  BUN 12  CREATININE 0.62  CALCIUM 8.8*   GFR: CrCl cannot be calculated (Unknown ideal weight.). Liver Function Tests:  Recent Labs Lab 07/07/16 1747  AST 19  ALT 17  ALKPHOS 107  BILITOT 0.6  PROT 7.2  ALBUMIN 3.9   No results for input(s): LIPASE, AMYLASE in the last 168 hours. No results for input(s): AMMONIA in the last 168 hours. Coagulation Profile: No results for input(s): INR, PROTIME in the last 168 hours. Cardiac Enzymes: No results for input(s): CKTOTAL, CKMB, CKMBINDEX, TROPONINI in the last 168 hours. BNP (last 3 results) No results for input(s): PROBNP in the last 8760 hours. HbA1C: No results for input(s): HGBA1C in the last 72 hours. CBG: No results for input(s): GLUCAP in the last 168 hours. Lipid Profile: No results for input(s): CHOL, HDL, LDLCALC, TRIG, CHOLHDL, LDLDIRECT in the last 72 hours. Thyroid Function Tests: No results for input(s): TSH, T4TOTAL, FREET4, T3FREE, THYROIDAB in the last 72 hours. Anemia Panel: No results for input(s): VITAMINB12, FOLATE, FERRITIN, TIBC, IRON,  RETICCTPCT in the last 72 hours. Urine analysis:    Component Value Date/Time   COLORURINE YELLOW 07/03/2014 1334   APPEARANCEUR CLEAR 07/03/2014 1334   LABSPEC 1.010 07/03/2014 1334   PHURINE 6.0 07/03/2014 1334   GLUCOSEU NEGATIVE 07/03/2014 1334   HGBUR NEGATIVE 07/03/2014 1334   BILIRUBINUR NEGATIVE 07/03/2014 1334   BILIRUBINUR neg 06/18/2012 1202   [place] 07/03/2014 1334   PROTEINUR NEGATIVE 07/03/2014 1334   PROTEINUR trace 06/18/2012 1202   UROBILINOGEN 0.2 07/03/2014 1334   UROBILINOGEN negative 06/18/2012 1202   NITRITE NEGATIVE 07/03/2014 1334   NITRITE neg 06/18/2012 1202   LEUKOCYTESUR NEGATIVE 07/03/2014 1334   Sepsis Labs: @LABRCNTIP (procalcitonin:4,lacticidven:4) )No results found for this or any previous visit (from the past 240 hour(s)).  Radiological Exams on Admission: No results found.  EKG: Independently reviewed.  Assessment/Plan Principal Problem:   Cellulitis of left leg Active Problems:   HTN (hypertension)   DM (diabetes mellitus) (HCC)   Venous stasis ulcer of left lower extremity (HCC)    Cellulitis of left leg -  Failed outpatient  Vanc per pharm  HTN - continue home meds  DM2 - continue home meds  Venous stasis ulcer of left leg -  Wound care consult  Sounds like patient already has vascular surgeon following her as outpatient, vein mapping done and plans for vein destruction for surgical management.   DVT prophylaxis: Lovenox Code Status: Full Family Communication: Daughter at bedside Consults called: None Admission status: Admit to inpatient   Etta Quill DO [facility] Pager [phone] from 7PM-7AM  If 7AM-7PM, please contact the day physician for the patient [email or web address] Password [facility]  07/07/2016, 8:23 PM

## 2016-07-07 NOTE — ED Provider Notes (Signed)
Emergency Department Provider Note  Time seen: Approximately 7:09 PM  I have reviewed the triage vital signs and the nursing notes.   HISTORY  Chief Complaint Cellulitis   HPI Jenna [name] is a 82 y.o. female with PMH of DM, HTN, HLD, chronic LE edema and poorly healing wounds of the LE presents to the emergency department from her primary care physician's office for evaluation of worsening left lower extremity cellulitis. The patient has been on doxycycline and clindamycin for the past week. She completed her course today and saw her primary care physician. The physician noted decreased swelling but continued redness and warmth. The patient denies any systemic signs or symptoms of infection such as fever, shaking chills, diaphoresis. She notes continued pain in the left lower extremity. She has a prior episode of cellulitis in the same leg approximately one year prior.   Past Medical History  Diagnosis Date  . Diabetes mellitus   . Arthritis   . Hypertension   . Hyperlipemia   . OAB (overactive bladder)   . Cellulitis of left lower extremity   . Non-healing ulcer of lower leg (HCC)     Left leg    . Cancer [facility])     endometrial cancer    Patient Active Problem List   Diagnosis Date Noted  . Cellulitis of left leg 07/07/2016  . Venous stasis ulcer of left lower extremity ([place]) 07/07/2016  . Chronic cholecystitis with calculus s/p lap chole 07/04/2014 07/03/2014  . HTN (hypertension) 05/31/2012  . DM (diabetes mellitus) ([place]) 05/31/2012  . Hypercholesteremia 05/31/2012  . OAB (overactive bladder)   . Malignant neoplasm of corpus uteri, except isthmus ([place]) 12/10/2011    Class: Stage 1    Past Surgical History  Procedure Laterality Date  . Rotator cuff repair      right arm  . Knee surgery      left knee  . Tubal ligation    . Eye surgery      cataract  . Joint replacement      knee  . Abdominal hysterectomy  02/2008    robotic hyst BSO Bilateral LND  .  Laparoscopic cholecystectomy single port N/A 07/04/2014    Procedure: LAPAROSCOPIC CHOLECYSTECTOMY SINGLE PORT;  Surgeon: Adin Hector, MD;  Location: [facility];  Service: General;  Laterality: N/A;    No current outpatient prescriptions on file.  Allergies Shrimp and Ancef  Family History  Problem Relation Age of Onset  . Cancer Brother 56    rectal  . Early death Brother     Social History Social History  Substance Use Topics  . Smoking status: Never Smoker   . Smokeless tobacco: Never Used  . Alcohol Use: No    Review of Systems  Constitutional: No fever/chills Eyes: No visual changes. ENT: No sore throat. Cardiovascular: Denies chest pain. Respiratory: Denies shortness of breath. Gastrointestinal: No abdominal pain.  No nausea, no vomiting.  No diarrhea.  No constipation. Genitourinary: Negative for dysuria. Musculoskeletal: Negative for back pain. Skin: LLE rash and pain Neurological: Negative for headaches, focal weakness or numbness.  10-point ROS otherwise negative.  ____________________________________________   PHYSICAL EXAM:  VITAL SIGNS: ED Triage Vitals  Enc Vitals Group     BP 07/07/16 1440 166/78 mmHg     Pulse Rate 07/07/16 1440 91     Resp 07/07/16 1440 18     Temp 07/07/16 1440 98.3 F (36.8 C)     Temp Source 07/07/16 1440 Oral  SpO2 07/07/16 1440 98 %     Pain Score 07/07/16 1452 7   Constitutional: Alert and oriented. Well appearing and in no acute distress. Eyes: Conjunctivae are normal. PERRL.  Head: Atraumatic. Nose: No congestion/rhinnorhea. Mouth/Throat: Mucous membranes are moist.  Oropharynx non-erythematous. Neck: No stridor.  Cardiovascular: Normal rate, regular rhythm. Good peripheral circulation. Grossly normal heart sounds.   Respiratory: Normal respiratory effort.  No retractions. Lungs CTAB. Gastrointestinal: Soft and nontender. No distention.  Musculoskeletal: No lower extremity tenderness nor edema. No gross  deformities of extremities. Neurologic:  Normal speech and language. No gross focal neurologic deficits are appreciated.  Skin:  Skin is warm, dry and intact. Erythema over the LLE with increased warmth. No drainage.  Psychiatric: Mood and affect are normal. Speech and behavior are normal.  ____________________________________________   LABS (all labs ordered are listed, but only abnormal results are displayed)  Labs Reviewed  COMPREHENSIVE METABOLIC PANEL - Abnormal; Notable for the following:    Calcium 8.8 (*)    All other components within normal limits  CBC WITH DIFFERENTIAL/PLATELET  BASIC METABOLIC PANEL  I-STAT CG4 LACTIC ACID, ED  I-STAT CG4 LACTIC ACID, ED   RADIOLOGY  None ____________________________________________   PROCEDURES  Procedure(s) performed:   Procedures  None ____________________________________________   INITIAL IMPRESSION / ASSESSMENT AND PLAN / ED COURSE  Pertinent labs & imaging results that were available during my care of the patient were reviewed by me and considered in my medical decision making (see chart for details).  Patient worsens to the emergency department for evaluation of left lower extremity pain and redness consistent with cellulitis. She has failed outpatient treatment of doxycycline and clindamycin. She shows no signs of systemic infection at this time. No evidence to suggest abscess on my exam. No clinical concern for fasciitis or compartment syndrome. Labs are generally reassuring. Given treatment failure plan for admission, IV antibiotics, and wound consult as an inpatient. Patient had recent LE [place] to evaluate for DVT which was negative.   Discussed patient's case with Hospitalist, Dr. Alcario Drought.  Recommend admission to inpatient, med surg bed.  I will place holding orders per their request. Patient and family (if present) updated with plan. Care transferred to hospitalist service.  I reviewed all nursing notes, vitals,  pertinent old records, EKGs, labs, imaging (as available).  ____________________________________________  FINAL CLINICAL IMPRESSION(S) / ED DIAGNOSES  Final diagnoses:  Cellulitis of left lower extremity  Type 2 diabetes mellitus with complication, without [place]-term current use of insulin (HCC)  Essential hypertension     MEDICATIONS GIVEN DURING THIS VISIT:  Medications  latanoprost (XALATAN) 0.005 % ophthalmic solution 1 drop (1 drop Both Eyes Not Given 07/07/16 2225)  metFORMIN (GLUCOPHAGE) tablet 1,000 mg (not administered)  naproxen (NAPROSYN) tablet 375 mg (375 mg Oral Given 07/07/16 2216)  fesoterodine (TOVIAZ) tablet 8 mg (not administered)  enoxaparin (LOVENOX) injection 40 mg (40 mg Subcutaneous Given 07/07/16 2217)  amLODipine (NORVASC) tablet 10 mg (not administered)  aspirin tablet 325 mg (not administered)  atorvastatin (LIPITOR) tablet 20 mg (not administered)  glipiZIDE (GLUCOTROL XL) 24 hr tablet 2.5 mg (not administered)  hydrochlorothiazide (HYDRODIURIL) tablet 25 mg (not administered)  losartan (COZAAR) tablet 100 mg (not administered)  brimonidine (ALPHAGAN) 0.2 % ophthalmic solution 1 drop (1 drop Both Eyes Given 07/07/16 2224)    And  timolol (TIMOPTIC) 0.5 % ophthalmic solution 1 drop (1 drop Both Eyes Not Given 07/07/16 2224)  vancomycin (VANCOCIN) 2,000 mg in sodium chloride 0.9 % 500 mL  IVPB (2,000 mg Intravenous New Bag/Given 07/07/16 2008)     NEW OUTPATIENT MEDICATIONS STARTED DURING THIS VISIT:  None   Note:  This document was prepared using Dragon voice recognition software and may include unintentional dictation errors.  Nanda Quinton, MD Emergency Medicine  Margette Fast, MD 07/07/16 [phone]

## 2016-07-07 NOTE — ED Notes (Signed)
Pt sent from Dr. [name], states she was seen at the end of June and diagnosed with cellulitis to the left leg. After follow-up appointment with Dr. [name] today, pt states he wanted her to come here because there has been no improvement with oral antibiotics.

## 2016-07-08 LAB — BASIC METABOLIC PANEL
Anion gap: 5 (ref 5–15)
BUN: 9 mg/dL (ref 6–20)
CALCIUM: 8.5 mg/dL — AB (ref 8.9–10.3)
CO2: 26 mmol/L (ref 22–32)
CREATININE: 0.61 mg/dL (ref 0.44–1.00)
Chloride: 111 mmol/L (ref 101–111)
GFR calc non Af Amer: >60 mL/min (ref >60)
Glucose, Bld: 108 mg/dL — ABNORMAL HIGH (ref 65–99)
Potassium: 3.9 mmol/L (ref 3.5–5.1)
SODIUM: 142 mmol/L (ref 135–145)

## 2016-07-08 LAB — GLUCOSE, CAPILLARY: Glucose-Capillary: 101 mg/dL — ABNORMAL HIGH (ref 65–99)

## 2016-07-08 MED ORDER — VANCOMYCIN HCL 10 G IV SOLR
1250.0000 mg | INTRAVENOUS | Status: DC
  Administered 2016-07-08: 1250 mg via INTRAVENOUS
  Filled 2016-07-08: qty 1250

## 2016-07-08 MED ORDER — HYDROCODONE-ACETAMINOPHEN 5-325 MG PO TABS
1.0000 | ORAL_TABLET | ORAL | Status: DC | PRN
  Administered 2016-07-08: 1 via ORAL
  Filled 2016-07-08: qty 1

## 2016-07-08 MED ORDER — ENOXAPARIN SODIUM 60 MG/0.6ML ~~LOC~~ SOLN
0.5000 mg/kg | SUBCUTANEOUS | Status: DC
  Administered 2016-07-08: 50 mg via SUBCUTANEOUS
  Filled 2016-07-08: qty 0.6

## 2016-07-08 NOTE — Progress Notes (Signed)
Pharmacy Antibiotic Note  Jenna [name] is a 82 y.o. female admitted on 07/07/2016 with cellulitis of LLE. Patient failed outpatient treatment with doxycycline and clindamycin. Pharmacy has been consulted for Vancomycin dosing. Vancomycin 2g IV x 1 was given in ED 7/10 at 2008.  Plan: Vancomycin 1250mg  IV q24h to start at 2000 today. Monitor renal function, culture results as available, clinical course.  Height: 5\' 1"  (154.9 cm) Weight: 227 lb (102.967 kg) IBW/kg (Calculated) : 47.8  Temp (24hrs), Avg:98.2 F (36.8 C), Min:97.9 F (36.6 C), Max:98.3 F (36.8 C)   Recent Labs Lab 07/07/16 1747 07/07/16 1802 07/07/16 1958 07/08/16 0523  WBC 6.4  --   --   --   CREATININE 0.62  --   --  0.61  LATICACIDVEN  --  0.69 0.77  --     Estimated Creatinine Clearance: 59.8 mL/min (by C-G formula based on Cr of 0.61).    Allergies  Allergen Reactions  . Shrimp [Shellfish Allergy] Anaphylaxis  . Ancef [Cefazolin] Nausea And Vomiting    Antimicrobials this admission: 7/10 >> Vancomycin >>  Dose adjustments this admission: --  Microbiology results: None ordered  Thank you for allowing pharmacy to be a part of this patient's care.   Lindell Spar, PharmD, BCPS Pager: [phone] 07/08/2016 1:00 PM

## 2016-07-08 NOTE — Progress Notes (Signed)
PROGRESS NOTE    Jenna [name]  [id number]  DOB: 1931/03/28  DOA: 07/07/2016 PCP: Lujean Amel, MD Outpatient Specialists:   Hospital course: Jenna [name] is a 82 y.o. female with medical history significant of Venous stasis ulceration and skin changes of LLE, seeing vascular surgery and actually was supposed to have a vein destruction procedure of some sort last fall, but didn't due to family issues.  Patient has had worsening swelling, pain, erythema, edema, to LLE ankle. Onset some 10 days ago. Failed outpatient doxy and clindamycin with only slight improvement to symptoms.  Assessment & Plan:   1. Nonpurulent cellulitis LLE - failed outpatient management, significant improvement after 1st dose of vancomycin. Would continue for now. Pt reports having a recent venous doppler of the LLE that was negative for DVT this was done a few days ago.     2. Essential Hypertension - stable on home medications 3. Diabetes Mellitus, type 2 - all home meds resumed on admission. Monitor BS.  No sliding scale coverage needed.  4. Venous stasis ulcer LLE - Appreciate WOC consult, pt to be fitted for boot and close outpatient follow up with PCP or outpatient wound care center needed.  Pt to follow up with her vascular surgeon.     CBG (last 3)  No results for input(s): GLUCAP in the last 72 hours.  DVT prophylaxis: Lovenox Code Status: Full Family Communication: Daughter at bedside Disposition: [place] in 1-2 days  Consults called: None Admission status: Admit to inpatient Consultants: [facility] nurse    Subjective: Pt reports significant improvement after 1st dose of vancomycin.   Objective: Filed Vitals:   07/07/16 2000 07/07/16 2228 07/08/16 0533 07/08/16 1100  BP: 156/79 147/67 138/56 143/56  Pulse: 67 74 76 76  Temp:  97.9 F (36.6 C) 98.3 F (36.8 C)   TempSrc:  Oral Oral   Resp:  20 20 20   Height:  5\' 1"  (1.549 m)    Weight:  227 lb (102.967 kg)    SpO2:  100% 99% 97% 98%   No intake or output data in the 24 hours ending 07/08/16 1238 Filed Weights   07/07/16 2228  Weight: 227 lb (102.967 kg)   Exam: Constitutional: NAD, calm, comfortable Eyes: PERRL, lids and conjunctivae normal ENMT: Mucous membranes are moist. Posterior pharynx clear of any exudate or lesions.Normal dentition.  Neck: normal, supple, no masses, no thyromegaly Respiratory: clear to auscultation bilaterally, no wheezing, no crackles. Normal respiratory effort. No accessory muscle use.  Cardiovascular: Regular rate and rhythm, no murmurs / rubs / gallops. No extremity edema. 2+ pedal pulses. No carotid bruits.  Abdomen: no tenderness, no masses palpated. No hepatosplenomegaly. Bowel sounds positive.  Musculoskeletal: no clubbing / cyanosis. No joint deformity upper and lower extremities. Swollen LLE.    Skin: Erythema, tenderness, and skin changes on LLE Neurologic: CN 2-12 grossly intact. Sensation intact, DTR normal. Strength 5/5 in all 4.  Psychiatric: Normal judgment and insight. Alert and oriented x 3. Normal mood.   Data Reviewed: Basic Metabolic Panel:  Recent Labs Lab 07/07/16 1747 07/08/16 0523  NA 139 142  K 3.9 3.9  CL 106 111  CO2 26 26  GLUCOSE 85 108*  BUN 12 9  CREATININE 0.62 0.61  CALCIUM 8.8* 8.5*   Liver Function Tests:  Recent Labs Lab 07/07/16 1747  AST 19  ALT 17  ALKPHOS 107  BILITOT 0.6  PROT 7.2  ALBUMIN 3.9   No results for input(s): LIPASE, AMYLASE  in the last 168 hours. No results for input(s): AMMONIA in the last 168 hours. CBC:  Recent Labs Lab 07/07/16 1747  WBC 6.4  NEUTROABS 3.8  HGB 12.0  HCT 37.1  MCV 83.2  PLT 298   Cardiac Enzymes: No results for input(s): CKTOTAL, CKMB, CKMBINDEX, TROPONINI in the last 168 hours. BNP (last 3 results) No results for input(s): PROBNP in the last 8760 hours. CBG: No results for input(s): GLUCAP in the last 168 hours.  No results found for this or any previous  visit (from the past 240 hour(s)).   Studies: No results found.  Scheduled Meds: . amLODipine  10 mg Oral Daily  . aspirin  325 mg Oral Daily  . atorvastatin  20 mg Oral Daily  . brimonidine  1 drop Both Eyes BID   And  . timolol  1 drop Both Eyes BID  . enoxaparin (LOVENOX) injection  40 mg Subcutaneous Q24H  . fesoterodine  8 mg Oral Daily  . [START ON 07/09/2016] glipiZIDE  2.5 mg Oral Q breakfast  . hydrochlorothiazide  25 mg Oral Daily  . latanoprost  1 drop Both Eyes QHS  . losartan  100 mg Oral Daily  . metFORMIN  1,000 mg Oral BID WC  . naproxen  375 mg Oral BID   Continuous Infusions:   Principal Problem:   Cellulitis of left leg Active Problems:   HTN (hypertension)   DM (diabetes mellitus) ([place])   Venous stasis ulcer of left lower extremity ([place])  Time spent:   Irwin Brakeman, MD, FAAFP [facility] Pager [phone]  If 7PM-7AM, please contact night-coverage [email or web address] Password [email or web address] 07/08/2016, 12:38 PM    LOS: 1 day

## 2016-07-08 NOTE — Progress Notes (Signed)
Nutrition Brief Note  Patient identified on the Malnutrition Screening Tool (MST) Report  Wt Readings from Last 15 Encounters:  07/07/16 227 lb (102.967 kg)  07/25/14 230 lb (104.327 kg)  07/03/14 233 lb 12.8 oz (106.051 kg)  06/28/14 233 lb 12.8 oz (106.051 kg)  05/21/14 230 lb (104.327 kg)  12/09/12 238 lb (107.956 kg)  10/28/12 245 lb (111.131 kg)  07/26/12 244 lb (110.678 kg)  06/03/12 243 lb (110.224 kg)  12/10/11 245 lb 14.4 oz (111.54 kg)    Body mass index is 42.91 kg/(m^2). Patient meets criteria for morbid obesity based on current BMI.   Current diet order is Carb Modified. She states that she ate a good breakfast and has not received lunch yet. She states that since admission she has had a good appetite and denies any abdominal pain or nausea associated with eating. Pt denies any changes in appetite recently or any recent changes in weight. Labs and medications reviewed.   No nutrition interventions warranted at this time. If nutrition issues arise, please consult RD.      Jarome Matin, MS, RD, LDN Inpatient Clinical Dietitian Pager # [phone] After hours/weekend pager # [phone]

## 2016-07-08 NOTE — Consult Note (Signed)
WOC wound consult note Reason for Consult:Chronic venous stasis with erythema and pre-ulcer state. Has had Unna's Boots in the past, also compression hosiery, but with edema, hosiery will not "fit".  Patient will require follow-up wither with PCP or outpatient [facility] of her choosing.  Please refer upon discharge if you agree for boot followup and for compression hosiery initiation when edema is reduced. Wound type:venous insufficiency Pressure Ulcer POA: No Measurement: Affected area is of the medial malleolus; no ulcer, but recent history of weeping dermatitis. Area measures 9cm x 6cm Wound bed:No open wound Drainage (amount, consistency, odor) No drainage today, recent history of weeping dermatitis Periwound: hemosiderin staining, edema, and erythema (resolving since yesterday, per patient and family) Dressing procedure/placement/frequency: As patient has worn compression boots in the past for exacerbation of venous insufficiency I will ask Nursing to wash dry and moisturize LE and then contact Ortho Tech for placement of boot. Recommend once weekly changes until PCP or wound care clinic determines it is appropriate for her to wear compression hosiery. [facility] nursing team will not follow, but will remain available to this patient, the nursing and medical teams.  Please re-consult if needed. Thanks, Maudie Flakes, MSN, RN, [place], Arther Abbott  Pager# [phone]

## 2016-07-09 DIAGNOSIS — L0390 Cellulitis, unspecified: Secondary | ICD-10-CM | POA: Diagnosis not present

## 2016-07-09 DIAGNOSIS — L03116 Cellulitis of left lower limb: Principal | ICD-10-CM

## 2016-07-09 LAB — GLUCOSE, CAPILLARY: Glucose-Capillary: 121 mg/dL — ABNORMAL HIGH (ref 65–99)

## 2016-07-09 LAB — CBC WITH DIFFERENTIAL/PLATELET
BASOS PCT: 0 %
Basophils Absolute: 0 10*3/uL (ref 0.0–0.1)
EOS ABS: 0.1 10*3/uL (ref 0.0–0.7)
EOS PCT: 3 %
HCT: 34.7 % — ABNORMAL LOW (ref 36.0–46.0)
HEMOGLOBIN: 11.3 g/dL — AB (ref 12.0–15.0)
LYMPHS ABS: 1.3 10*3/uL (ref 0.7–4.0)
Lymphocytes Relative: 28 %
MCH: 27.1 pg (ref 26.0–34.0)
MCHC: 32.6 g/dL (ref 30.0–36.0)
MCV: 83.2 fL (ref 78.0–100.0)
Monocytes Absolute: 0.5 10*3/uL (ref 0.1–1.0)
Monocytes Relative: 10 %
NEUTROS PCT: 59 %
Neutro Abs: 2.9 10*3/uL (ref 1.7–7.7)
PLATELETS: 290 10*3/uL (ref 150–400)
RBC: 4.17 MIL/uL (ref 3.87–5.11)
RDW: 14.9 % (ref 11.5–15.5)
WBC: 4.9 10*3/uL (ref 4.0–10.5)

## 2016-07-09 LAB — COMPREHENSIVE METABOLIC PANEL
ALBUMIN: 3.7 g/dL (ref 3.5–5.0)
ALK PHOS: 98 U/L (ref 38–126)
ALT: 16 U/L (ref 14–54)
ANION GAP: 6 (ref 5–15)
AST: 18 U/L (ref 15–41)
BUN: 10 mg/dL (ref 6–20)
CHLORIDE: 105 mmol/L (ref 101–111)
CO2: 27 mmol/L (ref 22–32)
Calcium: 8.7 mg/dL — ABNORMAL LOW (ref 8.9–10.3)
Creatinine, Ser: 0.63 mg/dL (ref 0.44–1.00)
GFR calc non Af Amer: >60 mL/min (ref >60)
GLUCOSE: 118 mg/dL — AB (ref 65–99)
Potassium: 3.8 mmol/L (ref 3.5–5.1)
SODIUM: 138 mmol/L (ref 135–145)
Total Bilirubin: 0.5 mg/dL (ref 0.3–1.2)
Total Protein: 6.6 g/dL (ref 6.5–8.1)

## 2016-07-09 MED ORDER — DOXYCYCLINE HYCLATE 100 MG PO TABS
100.0000 mg | ORAL_TABLET | Freq: Two times a day (BID) | ORAL | Status: DC
Start: 2016-07-09 — End: 2016-08-13

## 2016-07-09 NOTE — Discharge Summary (Signed)
Physician Discharge Summary  Jenna [name] [id number] DOB: 09-16-1931 DOA: 07/07/2016  PCP: Lujean Amel, MD  Admit date: 07/07/2016 Discharge date: 07/09/2016  Admitted From: home Disposition:  home  Recommendations for Outpatient Follow-up:  1. Follow up with wound care center in 5 days as scheduled 2. Continue Doxycycline for 7 days  3. Please obtain BMP/CBC in one week   Discharge Condition: stable CODE STATUS: Full Diet recommendation: regular  HPI: Jenna [name] is a 82 y.o. female with medical history significant of Venous stasis ulceration and skin changes of LLE, seeing vascular surgery and actually was supposed to have a vein destruction procedure of some sort last fall, but didn't due to family issues. Patient has had worsening swelling, pain, erythema, edema, to LLE ankle. Onset some 10 days ago.Was on doxycyline, felt that she was not improving and sent to the ER  Hospital Course: Discharge Diagnoses:  Principal Problem:   Cellulitis of left leg Active Problems:   HTN (hypertension)   DM (diabetes mellitus) ([place])   Venous stasis ulcer of left lower extremity ([facility])  Nonpurulent cellulitis LLE - patient has been on Doxycycline as an outpatient and she did appreciate improvement however her PCP recommended that she presented to the ER. She was started on IV antibiotics with Vancomycin with improvement in her pain and swelling. Wound care was consulted and applied Unna boot. Follow up appointment was scheduled in wound center in 5 days. I think she can go back on Doxycycline its unlikely she failed, she probably was in need of IV antibiotics for few days to overcome the infection. Will need 7 additional days.  Essential Hypertension - stable on home medications Diabetes Mellitus, type 2 - all home meds resumed  Venous stasis ulcer LLE - Appreciate WOC consult, pt to be fitted for unna boot and close outpatient follow up with wound care center was arranged. Pt  to follow up with her vascular surgeon as well   Discharge Instructions     Medication List    TAKE these medications        amLODipine 10 MG tablet  Commonly known as:  NORVASC  Take 10 mg by mouth daily.     aspirin 325 MG tablet  Take 325 mg by mouth daily.     atorvastatin 20 MG tablet  Commonly known as:  LIPITOR  Take 20 mg by mouth daily.     brimonidine-timolol 0.2-0.5 % ophthalmic solution  Commonly known as:  COMBIGAN  Place 1 drop into both eyes every 12 (twelve) hours.     doxycycline 100 MG tablet  Commonly known as:  VIBRA-TABS  Take 1 tablet (100 mg total) by mouth 2 (two) times daily.     glipiZIDE 2.5 MG 24 hr tablet  Commonly known as:  GLUCOTROL XL  Take 2.5 mg by mouth daily.     hydrochlorothiazide 25 MG tablet  Commonly known as:  HYDRODIURIL  Take 25 mg by mouth daily.     latanoprost 0.005 % ophthalmic solution  Commonly known as:  XALATAN  Place 1 drop into both eyes at bedtime.     losartan 100 MG tablet  Commonly known as:  COZAAR  Take 100 mg by mouth daily.     metFORMIN 1000 MG tablet  Commonly known as:  GLUCOPHAGE  Take 1,000 mg by mouth 2 (two) times daily with a meal.     naproxen 375 MG tablet  Commonly known as:  NAPROSYN  Take 1 tablet (375  mg total) by mouth 2 (two) times daily.     tolterodine 4 MG 24 hr capsule  Commonly known as:  DETROL LA  Take 4 mg by mouth daily.           Follow-up Information    Follow up with Lujean Amel, MD. Schedule an appointment as soon as possible for a visit in 1 week.   Specialty:  Family Medicine   Contact information:   [address] [address] [phone]       Follow up with [facility]. [facility] on 07/14/2016.   Specialty:  Wound Care   Why:  930 AM, Rolena Infante   Contact information:   [address] [id number] ar [phone]     Allergies  Allergen Reactions  . Shrimp [Shellfish  Allergy] Anaphylaxis  . Ancef [Cefazolin] Nausea And Vomiting    Consultations:  None   Procedures/Studies:  None    No results found.   Subjective: - feeling great, no pain, able to ambulate without discomfort  Discharge Exam: Filed Vitals:   07/08/16 2138 07/09/16 0614  BP: 142/66 135/73  Pulse: 95 72  Temp: 98.5 F (36.9 C) 97.9 F (36.6 C)  Resp: 18 18   Filed Vitals:   07/08/16 1100 07/08/16 1420 07/08/16 2138 07/09/16 0614  BP: 143/56 124/63 142/66 135/73  Pulse: 76 64 95 72  Temp:  98.8 F (37.1 C) 98.5 F (36.9 C) 97.9 F (36.6 C)  TempSrc:  Oral Oral Oral  Resp: 20 16 18 18   Height:      Weight:      SpO2: 98% 100% 97% 97%    General: Pt is alert, awake, not in acute distress Cardiovascular: RRR, S1/S2 +, no rubs, no gallops Respiratory: CTA bilaterally, no wheezing, no rhonchi Abdominal: Soft, NT, ND, bowel sounds + Extremities: no edema, no cyanosis    The results of significant diagnostics from this hospitalization (including imaging, microbiology, ancillary and laboratory) are listed below for reference.     Microbiology: No results found for this or any previous visit (from the past 240 hour(s)).   Labs: BNP (last 3 results) No results for input(s): BNP in the last 8760 hours. Basic Metabolic Panel:  Recent Labs Lab 07/07/16 1747 07/08/16 0523 07/09/16 0516  NA 139 142 138  K 3.9 3.9 3.8  CL 106 111 105  CO2 26 26 27   GLUCOSE 85 108* 118*  BUN 12 9 10   CREATININE 0.62 0.61 0.63  CALCIUM 8.8* 8.5* 8.7*   Liver Function Tests:  Recent Labs Lab 07/07/16 1747 07/09/16 0516  AST 19 18  ALT 17 16  ALKPHOS 107 98  BILITOT 0.6 0.5  PROT 7.2 6.6  ALBUMIN 3.9 3.7   No results for input(s): LIPASE, AMYLASE in the last 168 hours. No results for input(s): AMMONIA in the last 168 hours. CBC:  Recent Labs Lab 07/07/16 1747 07/09/16 0516  WBC 6.4 4.9  NEUTROABS 3.8 2.9  HGB 12.0 11.3*  HCT 37.1 34.7*  MCV 83.2 83.2    PLT 298 290   Cardiac Enzymes: No results for input(s): CKTOTAL, CKMB, CKMBINDEX, TROPONINI in the last 168 hours. BNP: Invalid input(s): POCBNP CBG:  Recent Labs Lab 07/08/16 1741 07/09/16 0741  GLUCAP 101* 121*   D-Dimer No results for input(s): DDIMER in the last 72 hours. Hgb A1c No results for input(s): HGBA1C in the last 72 hours. Lipid Profile No results for input(s): CHOL, HDL,  LDLCALC, TRIG, CHOLHDL, LDLDIRECT in the last 72 hours. Thyroid function studies No results for input(s): TSH, T4TOTAL, T3FREE, THYROIDAB in the last 72 hours.  Invalid input(s): FREET3 Anemia work up No results for input(s): VITAMINB12, FOLATE, FERRITIN, TIBC, IRON, RETICCTPCT in the last 72 hours. Urinalysis    Component Value Date/Time   COLORURINE YELLOW 07/03/2014 1334   APPEARANCEUR CLEAR 07/03/2014 1334   LABSPEC 1.010 07/03/2014 1334   PHURINE 6.0 07/03/2014 1334   GLUCOSEU NEGATIVE 07/03/2014 1334   HGBUR NEGATIVE 07/03/2014 1334   BILIRUBINUR NEGATIVE 07/03/2014 1334   BILIRUBINUR neg 06/18/2012 1202   [place] 07/03/2014 1334   PROTEINUR NEGATIVE 07/03/2014 1334   PROTEINUR trace 06/18/2012 1202   UROBILINOGEN 0.2 07/03/2014 1334   UROBILINOGEN negative 06/18/2012 1202   NITRITE NEGATIVE 07/03/2014 1334   NITRITE neg 06/18/2012 1202   LEUKOCYTESUR NEGATIVE 07/03/2014 1334   Sepsis Labs Invalid input(s): PROCALCITONIN,  WBC,  LACTICIDVEN Microbiology No results found for this or any previous visit (from the past 240 hour(s)).   Time coordinating discharge: Over 30 minutes  SIGNED:  Marzetta Board, MD  [facility] 07/09/2016, 1:51 PM Pager [phone]  If 7PM-7AM, please contact night-coverage [email or web address] Password [email or web address]

## 2016-07-09 NOTE — Discharge Instructions (Signed)
Follow with Lujean Amel, MD in 5-7 days  Please get a complete blood count and chemistry panel checked by your Primary MD at your next visit, and again as instructed by your Primary MD. Please get your medications reviewed and adjusted by your Primary MD.  Please request your Primary MD to go over all Hospital Tests and Procedure/Radiological results at the follow up, please get all Hospital records sent to your Prim MD by signing hospital release before you go home.  If you had Pneumonia of Lung problems at the Hospital: Please get a 2 view Chest X ray done in 6-8 weeks after hospital discharge or sooner if instructed by your Primary MD.  If you have Congestive Heart Failure: Please call your Cardiologist or Primary MD anytime you have any of the following symptoms:  1) 3 pound weight gain in 24 hours or 5 pounds in 1 week  2) shortness of breath, with or without a dry hacking cough  3) swelling in the hands, feet or stomach  4) if you have to sleep on extra pillows at night in order to breathe  Follow cardiac low salt diet and 1.5 lit/day fluid restriction.  If you have diabetes Accuchecks 4 times/day, Once in AM empty stomach and then before each meal. Log in all results and show them to your primary doctor at your next visit. If any glucose reading is under 80 or above 300 call your primary MD immediately.  If you have Seizure/Convulsions/Epilepsy: Please do not drive, operate heavy machinery, participate in activities at heights or participate in high speed sports until you have seen by Primary MD or a Neurologist and advised to do so again.  If you had Gastrointestinal Bleeding: Please ask your Primary MD to check a complete blood count within one week of discharge or at your next visit. Your endoscopic/colonoscopic biopsies that are pending at the time of discharge, will also need to followed by your Primary MD.  Get Medicines reviewed and adjusted. Please take all your  medications with you for your next visit with your Primary MD  Please request your Primary MD to go over all hospital tests and procedure/radiological results at the follow up, please ask your Primary MD to get all Hospital records sent to his/her office.  If you experience worsening of your admission symptoms, develop shortness of breath, life threatening emergency, suicidal or homicidal thoughts you must seek medical attention immediately by calling 911 or calling your MD immediately  if symptoms less severe.  You must read complete instructions/literature along with all the possible adverse reactions/side effects for all the Medicines you take and that have been prescribed to you. Take any new Medicines after you have completely understood and accpet all the possible adverse reactions/side effects.   Do not drive or operate heavy machinery when taking Pain medications.   Do not take more than prescribed Pain, Sleep and Anxiety Medications  Special Instructions: If you have smoked or chewed Tobacco  in the last 2 yrs please stop smoking, stop any regular Alcohol  and or any Recreational drug use.  Wear Seat belts while driving.  Please note You were cared for by a hospitalist during your hospital stay. If you have any questions about your discharge medications or the care you received while you were in the hospital after you are discharged, you can call the unit and asked to speak with the hospitalist on call if the hospitalist that took care of you is not available. Once you  are discharged, your primary care physician will handle any further medical issues. Please note that NO REFILLS for any discharge medications will be authorized once you are discharged, as it is imperative that you return to your primary care physician (or establish a relationship with a primary care physician if you do not have one) for your aftercare needs so that they can reassess your need for medications and monitor your  lab values.  You can reach the hospitalist office at phone [phone] or fax [phone]   If you do not have a primary care physician, you can call [phone] for a physician referral.  Activity: As tolerated with Full fall precautions use walker/cane & assistance as needed  Diet: diabetic  Disposition Home

## 2016-07-09 NOTE — Progress Notes (Signed)
Spoke with patient at bedside. States her husband is home to assist, has a son that also helps when needed. Husband assists with transportation, meals, etc. Independent with ADL's. No issues with medication. Understands to f/u with wound clinic, appt being made.

## 2016-07-14 ENCOUNTER — Encounter: Payer: Medicare Other | Attending: Surgery | Admitting: Surgery

## 2016-07-14 ENCOUNTER — Other Ambulatory Visit: Payer: Self-pay | Admitting: Family Medicine

## 2016-07-14 DIAGNOSIS — Z6841 Body Mass Index (BMI) 40.0 and over, adult: Secondary | ICD-10-CM | POA: Insufficient documentation

## 2016-07-14 DIAGNOSIS — Z7984 Long term (current) use of oral hypoglycemic drugs: Secondary | ICD-10-CM | POA: Diagnosis not present

## 2016-07-14 DIAGNOSIS — L97821 Non-pressure chronic ulcer of other part of left lower leg limited to breakdown of skin: Secondary | ICD-10-CM | POA: Diagnosis not present

## 2016-07-14 DIAGNOSIS — I83223 Varicose veins of left lower extremity with both ulcer of ankle and inflammation: Secondary | ICD-10-CM | POA: Insufficient documentation

## 2016-07-14 DIAGNOSIS — M1990 Unspecified osteoarthritis, unspecified site: Secondary | ICD-10-CM | POA: Diagnosis not present

## 2016-07-14 DIAGNOSIS — I87312 Chronic venous hypertension (idiopathic) with ulcer of left lower extremity: Secondary | ICD-10-CM | POA: Diagnosis not present

## 2016-07-14 DIAGNOSIS — L97929 Non-pressure chronic ulcer of unspecified part of left lower leg with unspecified severity: Secondary | ICD-10-CM

## 2016-07-14 DIAGNOSIS — E785 Hyperlipidemia, unspecified: Secondary | ICD-10-CM | POA: Insufficient documentation

## 2016-07-14 DIAGNOSIS — L97322 Non-pressure chronic ulcer of left ankle with fat layer exposed: Secondary | ICD-10-CM | POA: Insufficient documentation

## 2016-07-14 DIAGNOSIS — Z79899 Other long term (current) drug therapy: Secondary | ICD-10-CM | POA: Diagnosis not present

## 2016-07-14 DIAGNOSIS — Z8542 Personal history of malignant neoplasm of other parts of uterus: Secondary | ICD-10-CM | POA: Insufficient documentation

## 2016-07-14 DIAGNOSIS — E11622 Type 2 diabetes mellitus with other skin ulcer: Secondary | ICD-10-CM | POA: Diagnosis not present

## 2016-07-14 DIAGNOSIS — I10 Essential (primary) hypertension: Secondary | ICD-10-CM | POA: Insufficient documentation

## 2016-07-14 DIAGNOSIS — Z7982 Long term (current) use of aspirin: Secondary | ICD-10-CM | POA: Diagnosis not present

## 2016-07-14 NOTE — Progress Notes (Addendum)
MAILYN, HORITA ([id number]) Visit Report for 07/14/2016 Chief Complaint Document Details Patient Name: Jenna [name], Jenna [name]. Date of Service: 07/14/2016 9:30 AM Medical Record Number: [id number] Patient Account Number: [id number] Date of Birth/Sex: 1931-10-08 (82 y.o. Female) Treating RN: Ahmed Prima Primary Care Physician: Lujean Amel Other Clinician: Referring Physician: Marzetta Board Treating Physician/Extender: Frann Rider in Treatment: 0 Information Obtained from: Patient Chief Complaint Patient presents for treatment of an open ulcer due to venous insufficiency the left lower extremity she's had for several months now Electronic Signature(s) Signed: 07/14/2016 10:36:25 AM By: Christin Fudge MD, FACS Entered By: Christin Fudge on 07/14/2016 10:36:25 Jenna [name] ([id number]) -------------------------------------------------------------------------------- Debridement Details Patient Name: Jenna [name]. Date of Service: 07/14/2016 9:30 AM Medical Record Number: [id number] Patient Account Number: [id number] Date of Birth/Sex: 10/05/1931 (82 y.o. Female) Treating RN: Carolyne Fiscal, Debi Primary Care Physician: Lujean Amel Other Clinician: Referring Physician: Marzetta Board Treating Physician/Extender: Frann Rider in Treatment: 0 Debridement Performed for Wound #1 Left,Anterior Lower Leg Assessment: Performed By: Physician Christin Fudge, MD Debridement: Open Wound/Selective Debridement Selective Description: Pre-procedure Yes Verification/Time Out Taken: Start Time: 10:20 Pain Control: Lidocaine 4% Topical Solution Level: Skin/Epidermis Total Area Debrided (L x 5 (cm) x 4 (cm) = 20 (cm) W): Tissue and other Non-Viable, Eschar, Exudate, Fibrin/Slough material debrided: Instrument: Other : moist saline gauze Bleeding: None End Time: 10:23 Procedural Pain: 2 Post Procedural Pain: 0 Response to Treatment: Procedure was tolerated  well Post Debridement Measurements of Total Wound Length: (cm) 6 Width: (cm) 6 Depth: (cm) 0.1 Volume: (cm) 2.827 Post Procedure Diagnosis Same as Pre-procedure Electronic Signature(s) Signed: 07/14/2016 10:40:41 AM By: Christin Fudge MD, FACS Signed: 07/14/2016 4:39:30 PM By: Alric Quan Entered By: Christin Fudge on 07/14/2016 10:40:40 Jenna [name] ([id number]) -------------------------------------------------------------------------------- HPI Details Patient Name: Jenna [name] Date of Service: 07/14/2016 9:30 AM Medical Record Number: [id number] Patient Account Number: [id number] Date of Birth/Sex: Aug 18, 1931 (82 y.o. Female) Treating RN: Carolyne Fiscal, Debi Primary Care Physician: Lujean Amel Other Clinician: Referring Physician: Marzetta Board Treating Physician/Extender: Frann Rider in Treatment: 0 History of Present Illness Location: left lower extremity in the medial ankle region Quality: Patient reports experiencing a dull pain to affected area(s). Severity: Patient states wound are getting worse. Duration: Patient has had the wound for > 3 months prior to seeking treatment at the wound center Timing: Pain in wound is Intermittent (comes and goes Context: The wound appeared gradually over time Modifying Factors: Other treatment(s) tried include:[name] Associated Signs and Symptoms: Patient reports having increase swelling. HPI Description: 82 year old patient with a past medical history significant for venous stasis ulceration and diabetes mellitus was recently admitted to the hospital between 07/07/2016 and 07/09/2016. She was wound to have a nonpurulent cellulitis of the left lower extremity and was started on IV antibiotics which included vancomycin. He is discharged home with her and Unna's boots and oral doxycycline. During this admission there was no obvious DVT involving the left lower  extremity. her past medical history significant for diabetes mellitus, hypertension, hyperlipidemia, varicose veins, status post right rotator cuff repair, knee surgery on the left,robotic abdominal hysterectomy, laparoscopic cholecystectomy. She is not a smoker. Venous study done on 07/18/2015 showed normal left extremity deep venous system with no evidence of DVT. There was extensive valvular incompetence and reflux throughout the left greater saphenous vein with direct the medication to subcutaneous varicose veins. Normal left small saphenous vein. I understand she was seen by vascular radiology group  and the workup and recommendation was for endovenous ablation but due to family pressure she was not able to keep her appointment a year ago. She has not been wearing her compression stockings regularly. Electronic Signature(s) Signed: 07/14/2016 10:37:54 AM By: Christin Fudge MD, FACS Previous Signature: 07/14/2016 9:43:05 AM Version By: Christin Fudge MD, FACS Previous Signature: 07/14/2016 9:40:25 AM Version By: Christin Fudge MD, FACS Entered By: Christin Fudge on 07/14/2016 10:37:54 Jenna [name] ([id number]) -------------------------------------------------------------------------------- Physical Exam Details Patient Name: Jenna [name] Date of Service: 07/14/2016 9:30 AM Medical Record Number: [id number] Patient Account Number: [id number] Date of Birth/Sex: November 26, 1931 (82 y.o. Female) Treating RN: Carolyne Fiscal, Debi Primary Care Physician: Lujean Amel Other Clinician: Referring Physician: Marzetta Board Treating Physician/Extender: Frann Rider in Treatment: 0 Constitutional . Pulse regular. Respirations normal and unlabored. Afebrile. . Eyes Nonicteric. Reactive to light. Ears, Nose, Mouth, and Throat Lips, teeth, and gums WNL.Marland Kitchen Moist mucosa without lesions. Neck supple and nontender. No palpable supraclavicular or cervical adenopathy. Normal sized without  goiter. Respiratory WNL. No retractions.. Cardiovascular Pedal Pulses WNL. ABI on the left is 0.88 on the right is 0.95. No clubbing, cyanosis does have stage I lymphedema on the left lower extremity. Chest Breasts symmetical and no nipple discharge.. Breast tissue WNL, no masses, lumps, or tenderness.. Gastrointestinal (GI) Abdomen without masses or tenderness.. No liver or spleen enlargement or tenderness.. Lymphatic No adneopathy. No adenopathy. No adenopathy. Musculoskeletal Adexa without tenderness or enlargement.. Digits and nails w/o clubbing, cyanosis, infection, petechiae, ischemia, or inflammatory conditions.. Integumentary (Hair, Skin) No suspicious lesions. No crepitus or fluctuance. No peri-wound warmth or erythema. No masses.Marland Kitchen Psychiatric Judgement and insight Intact.. No evidence of depression, anxiety, or agitation.. Notes he has stigmata of venous hypertension on the left lower extremity with darkened skin and a significant amount of eschar over the medial ankle area just above the malleolus. She also has open ulcerations. Moist saline gauze all the eschar was removed and some of it is showing no open ulcerations under the eschar. Electronic Signature(s) Signed: 07/14/2016 10:39:03 AM By: Christin Fudge MD, FACS Entered By: Christin Fudge on 07/14/2016 10:39:03 Jenna [name], Jenna [name] ([id number], LEIST ([id number]) -------------------------------------------------------------------------------- Physician Orders Details Patient Name: Jenna [name], Jenna [name]. Date of Service: 07/14/2016 9:30 AM Medical Record Number: [id number] Patient Account Number: [id number] Date of Birth/Sex: 24-Sep-1931 (82 y.o. Female) Treating RN: Carolyne Fiscal, Debi Primary Care Physician: Lujean Amel Other Clinician: Referring Physician: Marzetta Board Treating Physician/Extender: Frann Rider in Treatment: 0 Verbal / Phone Orders: Yes ClinicianCarolyne Fiscal, Debi Read Back and  Verified: Yes Diagnosis Coding Wound Cleansing Wound #1 Left,Anterior Lower Leg o Clean wound with Normal Saline. - for clinic use o Cleanse wound with mild soap and water - for clinic use o May shower with protection. Anesthetic Wound #1 Left,Anterior Lower Leg o Topical Lidocaine 4% cream applied to wound bed prior to debridement - for clinic use Primary Wound Dressing Wound #1 Left,Anterior Lower Leg o Aquacel Ag Secondary Dressing Wound #1 Left,Anterior Lower Leg o ABD pad Dressing Change Frequency Wound #1 Left,Anterior Lower Leg o Change dressing every week Follow-up Appointments Wound #1 Left,Anterior Lower Leg o Return Appointment in 1 week. Edema Control Wound #1 Left,Anterior Lower Leg o 4-Layer Compression System - Left Lower Extremity - anchor with unna o Elevate legs to the level of the heart and pump ankles as often as possible Additional Orders / Instructions Wound #1 Left,Anterior Lower Leg o Increase protein intake. Jenna [name], Jenna [name] ([id number]) Electronic Signature(s) Signed: 07/14/2016 3:43:28  PM By: Christin Fudge MD, FACS Signed: 07/14/2016 4:39:30 PM By: Alric Quan Entered By: Alric Quan on 07/14/2016 10:23:25 Jenna [name] ([id number]) -------------------------------------------------------------------------------- Problem List Details Patient Name: Jenna [name], Jenna [name]. Date of Service: 07/14/2016 9:30 AM Medical Record Number: [id number] Patient Account Number: [id number] Date of Birth/Sex: September 20, 1931 (82 y.o. 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  07/14/2016 Yes exposed I83.223 Varicose veins of left lower extremity with both ulcer of 07/14/2016 Yes ankle and inflammation E66.01 Morbid (severe) obesity due to excess calories 07/14/2016 Yes Inactive Problems Resolved Problems Electronic Signature(s) Signed: 07/14/2016 10:36:00 AM By: Christin Fudge MD, FACS Entered By: Christin Fudge on 07/14/2016 10:36:00 Jenna [name] ([id number]) -------------------------------------------------------------------------------- Progress Note Details Patient Name: Jenna [name] Date of Service: 07/14/2016 9:30 AM Medical Record Number: [id number] Patient Account Number: [id number] Date of Birth/Sex: [birth year] (82 y.o. Female) Treating RN: Ahmed Prima Primary Care Physician: Lujean Amel Other Clinician: Referring Physician: Marzetta Board Treating Physician/Extender: Frann Rider in Treatment: 0 Subjective Chief Complaint Information obtained from Patient Patient presents for treatment of an open ulcer due to venous insufficiency the left lower extremity she's had for several months now History of Present Illness (HPI) The following HPI elements were documented for the patient's wound: Location: left lower extremity in the medial ankle region Quality: Patient reports experiencing a dull pain to affected area(s). Severity: Patient states wound are getting worse. Duration: Patient has had the wound for > 3 months prior to seeking treatment at the wound center Timing: Pain in wound is Intermittent (comes and goes Context: The wound appeared gradually over time Modifying Factors: Other treatment(s) tried include:[name] Associated Signs and Symptoms: Patient reports having increase swelling. 82 year old patient with a past medical history significant for venous stasis ulceration and diabetes mellitus was recently admitted to the hospital between 07/07/2016 and 07/09/2016.  She was wound to have a nonpurulent cellulitis of the left lower extremity and was started on IV antibiotics which included vancomycin. He is discharged home with her and Unna's boots and oral doxycycline. During this admission there was no obvious DVT involving the left lower extremity. her past medical history significant for diabetes mellitus, hypertension, hyperlipidemia, varicose veins, status post right rotator cuff repair, knee surgery on the left,robotic abdominal hysterectomy, laparoscopic cholecystectomy. She is not a smoker. Venous study done on 07/18/2015 showed normal left extremity deep venous system with no evidence of DVT. There was extensive valvular incompetence and reflux throughout the left greater saphenous vein with direct the medication to subcutaneous varicose veins. Normal left small saphenous vein. I understand she was seen by vascular radiology group and the workup and recommendation was for endovenous ablation but due to family pressure she was not able to keep her appointment a year ago. She has not been wearing her compression stockings regularly. Wound History Patient presents with 1 open wound that has been present for approximately 3 weeks. Patient has been treating wound in the following manner: neopsorin now unna boot. Laboratory tests have not been performed in the last month. Patient reportedly has not tested positive for an antibiotic resistant organism. Patient reportedly has not tested positive for osteomyelitis. Patient reportedly has had testing performed to [facility],  [name] ([id number]) evaluate circulation in the legs. Patient experiences the following problems associated with their wounds: swelling. Patient History Information obtained from Patient. Allergies Shellfish (Severity: Severe, Reaction: anaphlaxsis), Ancef (Severity: Severe, Reaction: nausea and vomiting) Family History Cancer - Siblings, Diabetes - Siblings, Hypertension - Maternal  Grandparents, Paternal Grandparents, Mother, Father, Siblings, Lung Disease - Siblings, No family history of Heart Disease, Hereditary Spherocytosis, Kidney Disease, Seizures, Stroke, Thyroid Problems, Tuberculosis. Social History Never smoker, Marital Status - Married, Alcohol Use - Never, Drug Use - No History, Caffeine Use - Daily. Medical History Eyes Patient has history of Cataracts - surgery, Glaucoma Cardiovascular Patient has history of Hypertension Endocrine Patient has history of Type II Diabetes Musculoskeletal Patient has history of Osteoarthritis Oncologic Denies history of Received Chemotherapy, Received Radiation Patient is treated with Oral Agents. Blood sugar is tested. Review of Systems (ROS) Constitutional Symptoms (General Health) The patient has no complaints or symptoms. Eyes Complains or has symptoms of Glasses / Contacts - glasses. Ear/Nose/Mouth/Throat The patient has no complaints or symptoms. Hematologic/Lymphatic The patient has no complaints or symptoms. Respiratory The patient has no complaints or symptoms. Cardiovascular hyperlipiedemia Gastrointestinal The patient has no complaints or symptoms. Genitourinary Denies complaints or symptoms of Incontinence/dribbling, overactive bladder Jenna [name], Jenna [name] ([id number]) Immunological The patient has no complaints or symptoms. Integumentary (Skin) Complains or has symptoms of Wounds. Neurologic The patient has no complaints or symptoms. Oncologic endometrial cancer Psychiatric The patient has no complaints or symptoms. Medications aspirin 325 mg tablet oral 1 1 tablet oral daily metformin 1,000 mg tablet oral 1 1 tablet oral two times daily with meals glipizide ER 2.5 mg tablet, extended release 24 hr oral 1 1 tablet extended release 24hr oral daily atorvastatin 20 mg tablet oral 1 1 tablet oral daily losartan 100 mg tablet oral 1 1 tablet oral daily amlodipine 10 mg tablet oral 1 1 tablet  oral daily Combigan 0.2 %-0.5 % eye drops ophthalmic 1 1 drop ophthalmic into both eyes every 12 hours latanoprost 0.005 % eye drops ophthalmic 1 1 drops ophthalmic into both eyes nightly naproxen 375 mg tablet oral 1 1 tablet oral two times daily doxycycline hyclate 100 mg tablet oral 1 1 tablet oral two times daily hydrochlorothiazide 25 mg tablet oral 1 1 tablet oral daily tolterodine ER 4 mg capsule,extended release 24 hr oral 1 1 capsule,extended release 24hr oral daily Objective Constitutional Pulse regular. Respirations normal and unlabored. Afebrile. Vitals Time Taken: 9:32 AM, Height: 61 in, Source: Stated, Weight: 225.2 lbs, Source: Measured, BMI: 42.5, Temperature: 97.5 F, Pulse: 79 bpm, Respiratory Rate: 18 breaths/min, Blood Pressure: 139/65 mmHg. Eyes Nonicteric. Reactive to light. Ears, Nose, Mouth, and Throat Lips, teeth, and gums WNL.Marland Kitchen Moist mucosa without lesions. Neck CAMI, HEATHCOTE ([id number]) supple and nontender. No palpable supraclavicular or cervical adenopathy. Normal sized without goiter. Respiratory WNL. No retractions.. Cardiovascular Pedal Pulses WNL. ABI on the left is 0.88 on the right is 0.95. No clubbing, cyanosis does have stage I lymphedema on the left lower extremity. Chest Breasts symmetical and no nipple discharge.. Breast tissue WNL, no masses, lumps, or tenderness.. Gastrointestinal (GI) Abdomen without masses or tenderness.. No liver or spleen enlargement or tenderness.. Lymphatic No adneopathy. No adenopathy. No adenopathy. Musculoskeletal Adexa without tenderness or enlargement.. Digits and nails w/o clubbing, cyanosis, infection, petechiae, ischemia, or inflammatory conditions.Marland Kitchen Psychiatric Judgement and insight Intact.. No evidence of depression, anxiety, or agitation.. General Notes: he has stigmata of venous hypertension on the left lower extremity with darkened skin and  a significant amount of eschar over the medial ankle area  just above the malleolus. She also has open ulcerations. Moist saline gauze all the eschar was removed and some of it is showing no open ulcerations under the eschar. Integumentary (Hair, Skin) No suspicious lesions. No crepitus or fluctuance. No peri-wound warmth or erythema. No masses.. Wound #1 status is Open. Original cause of wound was Blister. The wound is located on the Left,Anterior Lower Leg. The wound measures 6cm length x 6cm width x 0.1cm depth; 28.274cm^2 area and 2.827cm^3 volume. The wound is limited to skin breakdown. There is no tunneling or undermining noted. There is a large amount of serous drainage noted. The wound margin is flat and intact. There is large (67-100%) red granulation within the wound bed. There is a small (1-33%) amount of necrotic tissue within the wound bed including Adherent Slough. The periwound skin appearance exhibited: Localized Edema, Moist, Erythema. The surrounding wound skin color is noted with erythema which is circumferential. Periwound temperature was noted as No Abnormality. The periwound has tenderness on palpation. Assessment Active Problems Jenna [name], Jenna [name] ([id number]) ICD-10 E11.622 - Type 2 diabetes mellitus with other skin ulcer L97.322 - Non-pressure chronic ulcer of left ankle with fat layer exposed I83.223 - Varicose veins of left lower extremity with both ulcer of ankle and inflammation E66.01 - Morbid (severe) obesity due to excess calories 82 year old patient with known diabetes mellitus which is well-controlled has had a long history of varicose veins and venous ulceration on the left lower extremity. After a review I have recommended: 1. Silver alginate and a 4-layer Profore wrap 2. Elevation and exercise 3. Review by her interventional radiologist to reschedule her endovenous ablation 4. Order compression stockings of the 20-30 mm variety she will get this from [place] 5. Regular visits to the wound care  center Procedures Wound #1 Wound #1 is a Diabetic Wound/Ulcer of the Lower Extremity located on the Left,Anterior Lower Leg . There was a Skin/Epidermis Open Wound/Selective [phone]) debridement with total area of 20 sq cm performed by Christin Fudge, MD. with the following instrument(s): moist saline gauze to remove Non-Viable tissue/material including Exudate, Fibrin/Slough, and Eschar after achieving pain control using Lidocaine 4% Topical Solution. A time out was conducted prior to the start of the procedure. There was no bleeding. The procedure was tolerated well with a pain level of 2 throughout and a pain level of 0 following the procedure. Post Debridement Measurements: 6cm length x 6cm width x 0.1cm depth; 2.827cm^3 volume. Post procedure Diagnosis Wound #1: Same as Pre-Procedure Plan Wound Cleansing: Wound #1 Left,Anterior Lower Leg: Clean wound with Normal Saline. - for clinic use Cleanse wound with mild soap and water - for clinic use May shower with protection. Anesthetic: Wound #1 Left,Anterior Lower Leg: Topical Lidocaine 4% cream applied to wound bed prior to debridement - for clinic use Primary Wound Dressing: Wound #1 Left,Anterior Lower Leg: Aquacel Ag Jenna [name], Jenna [name] ([id number]) Secondary Dressing: Wound #1 Left,Anterior Lower Leg: ABD pad Dressing Change Frequency: Wound #1 Left,Anterior Lower Leg: Change dressing every week Follow-up Appointments: Wound #1 Left,Anterior Lower Leg: Return Appointment in 1 week. Edema Control: Wound #1 Left,Anterior Lower Leg: 4-Layer Compression System - Left Lower Extremity - anchor with unna Elevate legs to the level of the heart and pump ankles as often as possible Additional Orders / Instructions: Wound #1 Left,Anterior Lower Leg: Increase protein intake. 82 year old patient with known diabetes mellitus which is well-controlled has had a long history of varicose veins  and venous ulceration on the left lower  extremity. After a review I have recommended: 1. Silver alginate and a 4-layer Profore wrap 2. Elevation and exercise 3. Review by her interventional radiologist to reschedule her endovenous ablation 4. Order compression stockings of the 20-30 mm variety she will get this from [place] 5. Regular visits to the wound care center Electronic Signature(s) Signed: 07/14/2016 10:42:52 AM By: Christin Fudge MD, FACS Entered By: Christin Fudge on 07/14/2016 10:42:51 Jenna [name] ([id number]) -------------------------------------------------------------------------------- ROS/PFSH Details Patient Name: Jenna [name] Date of Service: 07/14/2016 9:30 AM Medical Record Number: [id number] Patient Account Number: [id number] Date of Birth/Sex: Sep 20, 1931 (82 y.o. Female) Treating RN: Carolyne Fiscal, Debi Primary Care Physician: Lujean Amel Other Clinician: Referring Physician: Marzetta Board Treating Physician/Extender: Frann Rider in Treatment: 0 Information Obtained From Patient Wound History Do you currently have one or more open woundso Yes How many open wounds do you currently haveo 1 Approximately how long have you had your woundso 3 weeks How have you been treating your wound(s) until nowo neopsorin now unna boot Has your wound(s) ever healed and then re-openedo No Have you had any lab work done in the past montho No Have you tested positive for an antibiotic resistant organism (MRSA, No VRE)o Have you tested positive for osteomyelitis (bone infection)o No Have you had any tests for circulation on your legso Yes Who ordered the testo Dr. Clotilde Dieter Where was the test [facility] Have you had other problems associated with your woundso Swelling Eyes Complaints and Symptoms: Positive for: Glasses / Contacts - glasses Medical History: Positive for: Cataracts - surgery; Glaucoma Genitourinary Complaints and Symptoms: Negative for: Incontinence/dribbling Review of  System Notes: overactive bladder Integumentary (Skin) Complaints and Symptoms: Positive for: Wounds Constitutional Symptoms (General Health) Jenna [name], Jenna [name] ([id number]) Complaints and Symptoms: No Complaints or Symptoms Ear/Nose/Mouth/Throat Complaints and Symptoms: No Complaints or Symptoms Hematologic/Lymphatic Complaints and Symptoms: No Complaints or Symptoms Respiratory Complaints and Symptoms: No Complaints or Symptoms Cardiovascular Complaints and Symptoms: Review of System Notes: hyperlipiedemia Medical History: Positive for: Hypertension Gastrointestinal Complaints and Symptoms: No Complaints or Symptoms Endocrine Medical History: Positive for: Type II Diabetes Time with diabetes: 15 years Treated with: Oral agents Blood sugar tested every day: Yes Tested : Immunological Complaints and Symptoms: No Complaints or Symptoms Musculoskeletal Medical History: Positive for: Osteoarthritis Neurologic Jenna [name], Jenna [name] ([id number]) Complaints and Symptoms: No Complaints or Symptoms Oncologic Complaints and Symptoms: Review of System Notes: endometrial cancer Medical History: Negative for: Received Chemotherapy; Received Radiation Psychiatric Complaints and Symptoms: No Complaints or Symptoms HBO Extended History Items Eyes: Eyes: Cataracts Glaucoma Family and Social History Cancer: Yes - Siblings; Diabetes: Yes - Siblings; Heart Disease: No; Hereditary Spherocytosis: No; Hypertension: Yes - Maternal Grandparents, Paternal Grandparents, Mother, Father, Siblings; Kidney Disease: No; Lung Disease: Yes - Siblings; Seizures: No; Stroke: No; Thyroid Problems: No; Tuberculosis: No; Never smoker; Marital Status - Married; Alcohol Use: Never; Drug Use: No History; Caffeine Use: Daily; Financial Concerns: No; Food, Clothing or Shelter Needs: No; Support System Lacking: No; Transportation Concerns: No; Advanced Directives: No; Patient does not want information on  Advanced Directives; Do not resuscitate: No; Living Will: No; Medical Power of Attorney: No Physician Affirmation I have reviewed and agree with the above information. Electronic Signature(s) Signed: 07/14/2016 3:43:28 PM By: Christin Fudge MD, FACS Signed: 07/14/2016 4:39:30 PM By: Alric Quan Entered By: Christin Fudge on 07/14/2016 10:12:53 Jenna [name] ([id number]) -------------------------------------------------------------------------------- SuperBill Details Patient Name: Jenna [name] Date of Service: 07/14/2016 Medical  Record Number: [id number] Patient Account Number: [id number] Date of Birth/Sex: 1931-11-30 (82 y.o. 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 E66.01 Morbid (severe) obesity due to excess calories Facility Procedures CPT4: Description Modifier Quantity Code [id number] - WOUND CARE VISIT-LEV 3 EST PT 1 CPT4: [id number] - DEBRIDE WOUND 1ST 20 SQ CM OR < 1 ICD-10 Description Diagnosis E11.622 Type 2 diabetes mellitus with other skin ulcer L97.322 Non-pressure chronic ulcer of left ankle with fat layer exposed I83.223 Varicose veins of left lower  extremity with both ulcer of ankle and inflammation E66.01 Morbid (severe) obesity due to excess calories Physician Procedures CPT4: Description Modifier Quantity Code [id number] - WC PHYS LEVEL 4 - NEW PT 25 1 ICD-10 Description Diagnosis E11.622 Type 2 diabetes mellitus with other skin ulcer L97.322 Non-pressure chronic ulcer of left ankle with fat layer exposed I83.223  Varicose veins of  left lower extremity with both ulcer of ankle and inflammation E66.01 Morbid (severe) obesity due to excess calories CPT4: [id number] - WC PHYS DEBR WO ANESTH 20 SQ CM 1 Description Jenna [name], Jenna [name] ([id number]) Electronic Signature(s) Signed: 07/14/2016 3:43:28 PM By: Christin Fudge MD, FACS Signed: 07/14/2016 4:39:30 PM By: Alric Quan Previous Signature: 07/14/2016 10:43:17 AM Version By: Christin Fudge MD, FACS Entered By: Alric Quan on 07/14/2016 10:44:20

## 2016-07-15 NOTE — Progress Notes (Signed)
on Yes N/A N/A Palpation: Wound Preparation: Ulcer Cleansing: Other: N/A N/A soap and water Topical Anesthetic Applied: None Treatment Notes Electronic Signature(s) Signed: 07/14/2016 4:39:30 PM By: Alric Quan Entered By: Alric Quan on 07/14/2016 10:17:37 Jenna [name] ([id number]) -------------------------------------------------------------------------------- [place] Details Patient Name: Jenna [name]. Date of Service: 07/14/2016 9:30 AM Medical Record Number:  [id number] Patient Account Number: [id number] Date of Birth/Sex: [birth year] (82 y.o. Female) Treating RN: Jenna [name], Debi Primary Care Physician: Lujean Amel Other Clinician: Referring Physician: Marzetta Board Treating Physician/Extender: Frann Rider in Treatment: 0 Active Inactive Orientation to the Wound Care Program Nursing Diagnoses: Knowledge deficit related to the wound healing center program Goals: Patient/caregiver will verbalize understanding of the [facility] Program Date Initiated: 07/14/2016 Goal Status: Active Interventions: Provide education on orientation to the wound center Notes: Pain, Acute or Chronic Nursing Diagnoses: Pain, acute or chronic: actual or potential Potential alteration in comfort, pain Goals: Patient will verbalize adequate pain control and receive pain control interventions during procedures as needed Date Initiated: 07/14/2016 Goal Status: Active Interventions: Assess comfort goal upon admission Complete pain assessment as per visit requirements Notes: Venous Leg Ulcer Nursing Diagnoses: Knowledge deficit related to disease process and management Jenna [name] ([id number]) Goals: Patient will maintain optimal edema control Date Initiated: 07/14/2016 Goal Status: Active Interventions: Compression as ordered Notes: Wound/Skin Impairment Nursing Diagnoses: Impaired tissue integrity Goals: Ulcer/skin breakdown will have a volume reduction of 30% by week 4 Date Initiated: 07/14/2016 Goal Status: Active Ulcer/skin breakdown will have a volume reduction of 50% by week 8 Date Initiated: 07/14/2016 Goal Status: Active Ulcer/skin breakdown will have a volume reduction of 80% by week 12 Date Initiated: 07/14/2016 Goal Status: Active Interventions: Assess patient/caregiver ability to obtain necessary supplies Assess ulceration(s) every visit Notes: Electronic Signature(s) Signed: 07/14/2016 4:39:30 PM By: Alric Quan Entered By: Alric Quan on 07/14/2016 10:17:27 Jenna [name] ([id number]) -------------------------------------------------------------------------------- Pain Assessment Details Patient Name: Jenna [name] Date of Service: 07/14/2016 9:30 AM Medical Record Number: [id number] Patient Account Number: [id number] Date of Birth/Sex: 08/15/1931 (82 y.o. Female) Treating RN: Ahmed Prima Primary Care Physician: Lujean Amel Other Clinician: Referring Physician: Marzetta Board Treating Physician/Extender: Frann Rider in Treatment: 0 Active Problems Location of Pain Severity and Description of Pain Patient Has Paino No Site Locations With Dressing Change: No Pain Management and Medication Current Pain Management: Notes Topical or injectable lidocaine is offered to patient for acute pain when surgical debridement is performed. If needed, Patient is instructed to use over the counter pain medication for the following 24-48 hours after debridement. Wound care MDs do not prescribed pain medications. Electronic Signature(s) Signed: 07/14/2016 4:39:30 PM By: Alric Quan Entered By: Alric Quan on 07/14/2016 09:32:06 Jenna [name] ([id number]) -------------------------------------------------------------------------------- Patient/Caregiver Education Details Patient Name: Jenna [name] Date of Service: 07/14/2016 9:30 AM Medical Record Number: [id number] Patient Account Number: [id number] Date of Birth/Gender: May 01, 1931 (82 y.o. Female) Treating RN: Jenna [name], Debi Primary Care Physician: Lujean Amel Other Clinician: Referring Physician: Marzetta Board Treating Physician/Extender: Frann Rider in Treatment: 0 Education Assessment Education Provided To: Patient Education Topics Provided Wound/Skin Impairment: Handouts: Other: do not get wrap wet Methods: Demonstration, Explain/Verbal Responses: State content  correctly Electronic Signature(s) Signed: 07/14/2016 4:39:30 PM By: Alric Quan Entered By: Alric Quan on 07/14/2016 10:24:29 Jenna [name] ([id number]) -------------------------------------------------------------------------------- Wound Assessment Details Patient Name: Jenna [name] Date of Service: 07/14/2016 9:30 AM Medical Record Number: [id number] Patient Account Number: [id number] Date of Birth/Sex: Nov 21, 1931 (82 y.o. Female)  15 Has the patient been seen at the hospital within the last three years: Yes Total Score: 95 Level Of Care: New/Established - Level 3 Electronic Signature(s) Signed: 07/14/2016 4:39:30 PM By: Alric Quan Entered By: Alric Quan on 07/14/2016 10:44:10 Jenna [name] ([id number]) -------------------------------------------------------------------------------- Encounter Discharge Information Details Patient Name: Jenna [name]. Date of Service: 07/14/2016 9:30 AM Medical Record Number: [id number] Patient Account Number: [id number] Date of Birth/Sex: Apr 15, 1931 (82 y.o. Female) Treating RN: Jenna [name], Debi Primary Care Physician: Lujean Amel Other Clinician: Referring Physician: Marzetta Board Treating Physician/Extender: Frann Rider in Treatment: 0 Encounter Discharge Information Items Discharge Pain Level: 0 Discharge Condition: Stable Ambulatory Status: Cane Discharge Destination: Home Transportation: Private Auto Accompanied By: daughter Schedule Follow-up Appointment: Yes Medication Reconciliation completed No and provided to Patient/Care [name]: Provided on Clinical Summary of Care: 07/14/2016 Form Type Recipient Paper Patient JD Electronic Signature(s) Signed: 07/14/2016 10:40:37 AM By: Ruthine Dose Entered By: Ruthine Dose on 07/14/2016 10:40:37 Jenna [name] ([id number]) -------------------------------------------------------------------------------- Lower Extremity Assessment Details Patient Name: Jenna [name] Date of Service: 07/14/2016 9:30 AM Medical  Record Number: [id number] Patient Account Number: [id number] Date of Birth/Sex: 1931/08/12 (82 y.o. Female) Treating RN: Jenna [name], Debi Primary Care Physician: Lujean Amel Other Clinician: Referring Physician: Marzetta Board Treating Physician/Extender: Frann Rider in Treatment: 0 Edema Assessment Assessed: [Left: No] [Right: No] Edema: [Left: Yes] [Right: Yes] Calf Left: Right: Point of Measurement: 32 cm From Medial Instep 47 cm 42.6 cm Ankle Left: Right: Point of Measurement: 10 cm From Medial Instep 23.5 cm 26.5 cm Vascular Assessment Pulses: Posterior Tibial Dorsalis Pedis Palpable: [Left:Yes] [Right:No] Doppler: [Left:Monophasic] [Right:Monophasic] Extremity colors, hair growth, and conditions: Extremity Color: [Left:Red] [Right:Hyperpigmented] Hair Growth on Extremity: [Left:No] [Right:No] Temperature of Extremity: [Left:Warm] [Right:Warm] Capillary Refill: [Left:< 3 seconds] [Right:< 3 seconds] Blood Pressure: Brachial: [Left:162] [Right:162] Dorsalis Pedis: 140 [Left:Dorsalis Pedis: [id number] Ankle: Posterior Tibial: 142 [Left:Posterior Tibial: 140 0.88] [Right:0.95] Electronic Signature(s) Signed: 07/14/2016 4:39:30 PM By: Alric Quan Entered By: Alric Quan on 07/14/2016 10:05:17 Jenna [name] ([id number]) -------------------------------------------------------------------------------- Multi Wound Chart Details Patient Name: Jenna [name] Date of Service: 07/14/2016 9:30 AM Medical Record Number: [id number] Patient Account Number: [id number] Date of Birth/Sex: 1931/10/05 (82 y.o. Female) Treating RN: Jenna [name], Debi Primary Care Physician: Lujean Amel Other Clinician: Referring Physician: Marzetta Board Treating Physician/Extender: Frann Rider in Treatment: 0 Vital Signs Height(in): 61 Pulse(bpm): 79 Weight(lbs): 225.2 Blood Pressure 139/65 (mmHg): Body Mass Index(BMI): 43 Temperature(F): 97.5 Respiratory  Rate 18 (breaths/min): Photos: [1:No Photos] [N/A:N/A] Wound Location: [1:Left Lower Leg - Anterior] [N/A:N/A] Wounding Event: [1:Blister] [N/A:N/A] Primary Etiology: [1:Diabetic Wound/Ulcer of the Lower Extremity] [N/A:N/A] Secondary Etiology: [1:Venous Leg Ulcer] [N/A:N/A] Comorbid History: [1:Cataracts, Glaucoma, Hypertension, Type II Diabetes, Osteoarthritis] [N/A:N/A] Date Acquired: [1:06/30/2016] [N/A:N/A] Weeks of Treatment: [1:0] [N/A:N/A] Wound Status: [1:Open] [N/A:N/A] Measurements L x W x D 15x10x0.1 [N/A:N/A] (cm) Area (cm) : [1:117.81] [N/A:N/A] Volume (cm) : [1:11.781] [N/A:N/A] % Reduction in Area: [1:0.00%] [N/A:N/A] % Reduction in Volume: 0.00% [N/A:N/A] Classification: [1:Grade 1] [N/A:N/A] Exudate Amount: [1:Large] [N/A:N/A] Exudate Type: [1:Serous] [N/A:N/A] Exudate Color: [1:amber] [N/A:N/A] Wound Margin: [1:Flat and Intact] [N/A:N/A] Granulation Amount: [1:Large (67-100%)] [N/A:N/A] Granulation Quality: [1:Red] [N/A:N/A] Necrotic Amount: [1:Small (1-33%)] [N/A:N/A] Exposed Structures: [1:Fascia: No Fat: No Tendon: No Muscle: No] [N/A:N/A] Joint: No Bone: No Limited to Skin Breakdown Epithelialization: None N/A N/A Periwound Skin Texture: Edema: Yes N/A N/A Periwound Skin Moist: Yes N/A N/A Moisture: Periwound Skin Color: Erythema: Yes N/A N/A Erythema Location: Circumferential N/A N/A Temperature: No Abnormality N/A N/A Tenderness  on Yes N/A N/A Palpation: Wound Preparation: Ulcer Cleansing: Other: N/A N/A soap and water Topical Anesthetic Applied: None Treatment Notes Electronic Signature(s) Signed: 07/14/2016 4:39:30 PM By: Alric Quan Entered By: Alric Quan on 07/14/2016 10:17:37 Jenna [name] ([id number]) -------------------------------------------------------------------------------- [place] Details Patient Name: Jenna [name]. Date of Service: 07/14/2016 9:30 AM Medical Record Number:  [id number] Patient Account Number: [id number] Date of Birth/Sex: [birth year] (82 y.o. Female) Treating RN: Jenna [name], Debi Primary Care Physician: Lujean Amel Other Clinician: Referring Physician: Marzetta Board Treating Physician/Extender: Frann Rider in Treatment: 0 Active Inactive Orientation to the Wound Care Program Nursing Diagnoses: Knowledge deficit related to the wound healing center program Goals: Patient/caregiver will verbalize understanding of the [facility] Date Initiated: 07/14/2016 Goal Status: Active Interventions: Provide education on orientation to the wound center Notes: Pain, Acute or Chronic Nursing Diagnoses: Pain, acute or chronic: actual or potential Potential alteration in comfort, pain Goals: Patient will verbalize adequate pain control and receive pain control interventions during procedures as needed Date Initiated: 07/14/2016 Goal Status: Active Interventions: Assess comfort goal upon admission Complete pain assessment as per visit requirements Notes: Venous Leg Ulcer Nursing Diagnoses: Knowledge deficit related to disease process and management Jenna [name] ([id number]) Goals: Patient will maintain optimal edema control Date Initiated: 07/14/2016 Goal Status: Active Interventions: Compression as ordered Notes: Wound/Skin Impairment Nursing Diagnoses: Impaired tissue integrity Goals: Ulcer/skin breakdown will have a volume reduction of 30% by week 4 Date Initiated: 07/14/2016 Goal Status: Active Ulcer/skin breakdown will have a volume reduction of 50% by week 8 Date Initiated: 07/14/2016 Goal Status: Active Ulcer/skin breakdown will have a volume reduction of 80% by week 12 Date Initiated: 07/14/2016 Goal Status: Active Interventions: Assess patient/caregiver ability to obtain necessary supplies Assess ulceration(s) every visit Notes: Electronic Signature(s) Signed: 07/14/2016 4:39:30 PM By: Alric Quan Entered By: Alric Quan on 07/14/2016 10:17:27 Jenna [name] ([id number]) -------------------------------------------------------------------------------- Pain Assessment Details Patient Name: Jenna [name] Date of Service: 07/14/2016 9:30 AM Medical Record Number: [id number] Patient Account Number: [id number] Date of Birth/Sex: 08/15/1931 (82 y.o. Female) Treating RN: Ahmed Prima Primary Care Physician: Lujean Amel Other Clinician: Referring Physician: Marzetta Board Treating Physician/Extender: Frann Rider in Treatment: 0 Active Problems Location of Pain Severity and Description of Pain Patient Has Paino No Site Locations With Dressing Change: No Pain Management and Medication Current Pain Management: Notes Topical or injectable lidocaine is offered to patient for acute pain when surgical debridement is performed. If needed, Patient is instructed to use over the counter pain medication for the following 24-48 hours after debridement. Wound care MDs do not prescribed pain medications. Electronic Signature(s) Signed: 07/14/2016 4:39:30 PM By: Alric Quan Entered By: Alric Quan on 07/14/2016 09:32:06 Jenna [name] ([id number]) -------------------------------------------------------------------------------- Patient/Caregiver Education Details Patient Name: Jenna [name] Date of Service: 07/14/2016 9:30 AM Medical Record Number: [id number] Patient Account Number: [id number] Date of Birth/Gender: May 01, 1931 (82 y.o. Female) Treating RN: Jenna [name], Debi Primary Care Physician: Lujean Amel Other Clinician: Referring Physician: Marzetta Board Treating Physician/Extender: Frann Rider in Treatment: 0 Education Assessment Education Provided To: Patient Education Topics Provided Wound/Skin Impairment: Handouts: Other: do not get wrap wet Methods: Demonstration, Explain/Verbal Responses: State content  correctly Electronic Signature(s) Signed: 07/14/2016 4:39:30 PM By: Alric Quan Entered By: Alric Quan on 07/14/2016 10:24:29 Jenna [name] ([id number]) -------------------------------------------------------------------------------- Wound Assessment Details Patient Name: Jenna [name] Date of Service: 07/14/2016 9:30 AM Medical Record Number: [id number] Patient Account Number: [id number] Date of Birth/Sex: Nov 21, 1931 (82 y.o. Female)  on Yes N/A N/A Palpation: Wound Preparation: Ulcer Cleansing: Other: N/A N/A soap and water Topical Anesthetic Applied: None Treatment Notes Electronic Signature(s) Signed: 07/14/2016 4:39:30 PM By: Alric Quan Entered By: Alric Quan on 07/14/2016 10:17:37 Jenna [name] ([id number]) -------------------------------------------------------------------------------- [place] Details Patient Name: Jenna [name]. Date of Service: 07/14/2016 9:30 AM Medical Record Number:  [id number] Patient Account Number: [id number] Date of Birth/Sex: [birth year] (82 y.o. Female) Treating RN: Jenna [name], Debi Primary Care Physician: Lujean Amel Other Clinician: Referring Physician: Marzetta Board Treating Physician/Extender: Frann Rider in Treatment: 0 Active Inactive Orientation to the Wound Care Program Nursing Diagnoses: Knowledge deficit related to the wound healing center program Goals: Patient/caregiver will verbalize understanding of the [employer] Date Initiated: 07/14/2016 Goal Status: Active Interventions: Provide education on orientation to the wound center Notes: Pain, Acute or Chronic Nursing Diagnoses: Pain, acute or chronic: actual or potential Potential alteration in comfort, pain Goals: Patient will verbalize adequate pain control and receive pain control interventions during procedures as needed Date Initiated: 07/14/2016 Goal Status: Active Interventions: Assess comfort goal upon admission Complete pain assessment as per visit requirements Notes: Venous Leg Ulcer Nursing Diagnoses: Knowledge deficit related to disease process and management Jenna [name] ([id number]) Goals: Patient will maintain optimal edema control Date Initiated: 07/14/2016 Goal Status: Active Interventions: Compression as ordered Notes: Wound/Skin Impairment Nursing Diagnoses: Impaired tissue integrity Goals: Ulcer/skin breakdown will have a volume reduction of 30% by week 4 Date Initiated: 07/14/2016 Goal Status: Active Ulcer/skin breakdown will have a volume reduction of 50% by week 8 Date Initiated: 07/14/2016 Goal Status: Active Ulcer/skin breakdown will have a volume reduction of 80% by week 12 Date Initiated: 07/14/2016 Goal Status: Active Interventions: Assess patient/caregiver ability to obtain necessary supplies Assess ulceration(s) every visit Notes: Electronic Signature(s) Signed: 07/14/2016 4:39:30 PM By: Alric Quan Entered By: Alric Quan on 07/14/2016 10:17:27 Jenna [name] ([id number]) -------------------------------------------------------------------------------- Pain Assessment Details Patient Name: Jenna [name] Date of Service: 07/14/2016 9:30 AM Medical Record Number: [id number] Patient Account Number: [id number] Date of Birth/Sex: 08/15/1931 (82 y.o. Female) Treating RN: Ahmed Prima Primary Care Physician: Lujean Amel Other Clinician: Referring Physician: Marzetta Board Treating Physician/Extender: Frann Rider in Treatment: 0 Active Problems Location of Pain Severity and Description of Pain Patient Has Paino No Site Locations With Dressing Change: No Pain Management and Medication Current Pain Management: Notes Topical or injectable lidocaine is offered to patient for acute pain when surgical debridement is performed. If needed, Patient is instructed to use over the counter pain medication for the following 24-48 hours after debridement. Wound care MDs do not prescribed pain medications. Electronic Signature(s) Signed: 07/14/2016 4:39:30 PM By: Alric Quan Entered By: Alric Quan on 07/14/2016 09:32:06 Jenna [name] ([id number]) -------------------------------------------------------------------------------- Patient/Caregiver Education Details Patient Name: Jenna [name] Date of Service: 07/14/2016 9:30 AM Medical Record Number: [id number] Patient Account Number: [id number] Date of Birth/Gender: May 01, 1931 (82 y.o. Female) Treating RN: Jenna [name], Debi Primary Care Physician: Lujean Amel Other Clinician: Referring Physician: Marzetta Board Treating Physician/Extender: Frann Rider in Treatment: 0 Education Assessment Education Provided To: Patient Education Topics Provided Wound/Skin Impairment: Handouts: Other: do not get wrap wet Methods: Demonstration, Explain/Verbal Responses: State content  correctly Electronic Signature(s) Signed: 07/14/2016 4:39:30 PM By: Alric Quan Entered By: Alric Quan on 07/14/2016 10:24:29 Jenna [name] ([id number]) -------------------------------------------------------------------------------- Wound Assessment Details Patient Name: Jenna [name] Date of Service: 07/14/2016 9:30 AM Medical Record Number: [id number] Patient Account Number: [id number] Date of Birth/Sex: Nov 21, 1931 (82 y.o. Female)

## 2016-07-15 NOTE — Progress Notes (Signed)
Jenna [name], Jenna [name] ([name]) Visit Report for 07/14/2016 Abuse/Suicide Risk Screen Details Patient Name: Jenna [name], Jenna [name] Patient Account Number: [id number] Date of Birth/Sex: Oct 03, 1931 (82 y.o. Female) Treating RN: Ahmed Prima Primary Care Physician: Lujean Amel Other Clinician: Referring Physician: Marzetta Board Treating Physician/Extender: Frann Rider in Treatment: 0 Abuse/Suicide Risk Screen Items Answer ABUSE/SUICIDE RISK SCREEN: Has anyone close to you tried to hurt or harm you recentlyo No Do you feel uncomfortable with anyone in your familyo No Has anyone forced you do things that you didnot want to doo No Do you have any thoughts of harming yourselfo No Patient displays signs or symptoms of abuse and/or neglect. No Electronic Signature(s) Signed: 07/14/2016 4:39:30 PM By: Alric Quan Entered By: Alric Quan on 07/14/2016 09:43:51 Jenna [name], Jenna [name] ([name]) -------------------------------------------------------------------------------- Activities of Daily Living Details Patient Name: Jenna [name], Jenna [name]. Date of Service: 07/14/2016 9:30 AM Medical Record Number: [name] Patient Account Number: [id number] Date of Birth/Sex: 12-08-1931 (82 y.o. Female) Treating RN: Ahmed Prima Primary Care Physician: Lujean Amel Other Clinician: Referring Physician: Marzetta Board Treating Physician/Extender: Frann Rider in Treatment: 0 Activities of Daily Living Items Answer Activities of Daily Living (Please select one for each item) Drive Automobile Not Able Take Medications Completely Able Use Telephone Completely Able Care for Appearance Completely Able Use Toilet Completely Able Bath / Shower Completely Able Dress Self Completely Able Feed Self Completely Able Walk Completely Able Get In / Out Bed Completely Able Housework Completely [place] for Self Need Assistance Electronic Signature(s) Signed: 07/14/2016 4:39:30 PM By: Alric Quan Entered By: Alric Quan on 07/14/2016 09:44:16 Jenna [name] ([name]) -------------------------------------------------------------------------------- Education Assessment Details Patient Name: Jenna [name] Date of Service: 07/14/2016 9:30 AM Medical Record Number: [name] Patient Account Number: [id number] Date of Birth/Sex: 06/22/1931 (82 y.o. Female) Treating RN: Carolyne Fiscal, Debi Primary Care Physician: Lujean Amel Other Clinician: Referring Physician: Marzetta Board Treating Physician/Extender: Frann Rider in Treatment: 0 Primary Learner Assessed: Patient Learning Preferences/Education Level/Primary Language Learning Preference: Explanation, Printed Material Highest Education Level: High School Preferred Language: English Cognitive Barrier Assessment/Beliefs Language Barrier: No Translator Needed: No Memory Deficit: No Emotional Barrier: No Physical Barrier Assessment Impaired Vision: Yes Glasses Impaired Hearing: No Decreased Hand dexterity: No Knowledge/Comprehension Assessment Knowledge Level: High Comprehension Level: High Ability to understand written High instructions: Ability to understand verbal High instructions: Motivation Assessment Anxiety Level: Calm Cooperation: Cooperative Education Importance: Acknowledges Need Interest in Health Problems: Asks Questions Perception: Coherent Willingness to Engage in Self- High Management Activities: Readiness to Engage in Self- High Management Activities: Electronic Signature(s) Signed: 07/14/2016 4:39:30 PM By: Veryl Speak ([name]) Entered By: Alric Quan on 07/14/2016 09:44:41 Jenna [name], Jenna [name] ([name]) -------------------------------------------------------------------------------- Fall Risk  Assessment Details Patient Name: Jenna [name] Date of Service: 07/14/2016 9:30 AM Medical Record Number: [name] Patient Account Number: [id number] Date of Birth/Sex: 1931-05-22 (82 y.o. Female) Treating RN: Carolyne Fiscal, Debi Primary Care Physician: Lujean Amel Other Clinician: Referring Physician: Marzetta Board Treating Physician/Extender: Frann Rider in Treatment: 0 Fall Risk Assessment Items Have you had 2 or more falls in the last 12 monthso 0 No Have you had any fall that resulted in injury in the last 12 monthso 0 No FALL RISK ASSESSMENT: History of falling - immediate or within 3 months 0 No Secondary diagnosis 0 No Ambulatory aid None/bed rest/wheelchair/nurse 0 No Crutches/cane/walker 15 Yes Furniture 0 No IV Access/Saline Lock  0 No Gait/Training Normal/bed rest/immobile 0 No Weak 0 No Impaired 0 No Mental Status Oriented to own ability 0 Yes Electronic Signature(s) Signed: 07/14/2016 4:39:30 PM By: Alric Quan Entered By: Alric Quan on 07/14/2016 09:45:35 Jenna [name], Jenna [name] ([id number]) -------------------------------------------------------------------------------- Foot Assessment Details Patient Name: Jenna [name] Date of Service: 07/14/2016 9:30 AM Medical Record Number: [id number] Patient Account Number: [id number] Date of Birth/Sex: Apr 23, 1931 (82 y.o. Female) Treating RN: Ahmed Prima Primary Care Physician: Lujean Amel Other Clinician: Referring Physician: Marzetta Board Treating Physician/Extender: Frann Rider in Treatment: 0 Foot Assessment Items Site Locations + = Sensation present, - = Sensation absent, C = Callus, U = Ulcer R = Redness, W = Warmth, M = Maceration, PU = Pre-ulcerative lesion F = Fissure, S = Swelling, D = Dryness Assessment Right: Left: Other Deformity: No No Prior Foot Ulcer: No No Prior Amputation: No No Charcot Joint: No No Ambulatory Status: Ambulatory With Help Assistance  Device: Cane Gait: Steady Electronic Signature(s) Signed: 07/14/2016 4:39:30 PM By: Alric Quan Entered By: Alric Quan on 07/14/2016 09:54:51 Jenna [name] ([id number]) -------------------------------------------------------------------------------- Nutrition Risk Assessment Details Patient Name: Jenna [name] Date of Service: 07/14/2016 9:30 AM Medical Record Number: [id number] Patient Account Number: [id number] Date of Birth/Sex: 1931/03/17 (82 y.o. Female) Treating RN: Carolyne Fiscal, Debi Primary Care Physician: Lujean Amel Other Clinician: Referring Physician: Marzetta Board Treating Physician/Extender: Frann Rider in Treatment: 0 Height (in): 61 Weight (lbs): 225.2 Body Mass Index (BMI): 42.5 Nutrition Risk Assessment Items NUTRITION RISK SCREEN: I have an illness or condition that made me change the kind and/or 0 No amount of food I eat I eat fewer than two meals per day 0 No I eat few fruits and vegetables, or milk products 0 No I have three or more drinks of beer, liquor or wine almost every day 0 No I have tooth or mouth problems that make it hard for me to eat 0 No I don't always have enough money to buy the food I need 0 No I eat alone most of the time 0 No I take three or more different prescribed or over-the-counter drugs a 1 Yes day Without wanting to, I have lost or gained 10 pounds in the last six 0 No months I am not always physically able to shop, cook and/or feed myself 0 No Nutrition Protocols Good Risk Protocol Moderate Risk Protocol Electronic Signature(s) Signed: 07/14/2016 4:39:30 PM By: Alric Quan Entered By: Alric Quan on 07/14/2016 09:45:46

## 2016-07-16 DIAGNOSIS — L03116 Cellulitis of left lower limb: Secondary | ICD-10-CM | POA: Diagnosis not present

## 2016-07-16 DIAGNOSIS — R600 Localized edema: Secondary | ICD-10-CM | POA: Diagnosis not present

## 2016-07-21 ENCOUNTER — Encounter: Payer: Medicare Other | Admitting: Surgery

## 2016-07-21 DIAGNOSIS — I83223 Varicose veins of left lower extremity with both ulcer of ankle and inflammation: Secondary | ICD-10-CM | POA: Diagnosis not present

## 2016-07-21 DIAGNOSIS — E785 Hyperlipidemia, unspecified: Secondary | ICD-10-CM | POA: Diagnosis not present

## 2016-07-21 DIAGNOSIS — E11622 Type 2 diabetes mellitus with other skin ulcer: Secondary | ICD-10-CM | POA: Diagnosis not present

## 2016-07-21 DIAGNOSIS — L97821 Non-pressure chronic ulcer of other part of left lower leg limited to breakdown of skin: Secondary | ICD-10-CM | POA: Diagnosis not present

## 2016-07-21 DIAGNOSIS — L97322 Non-pressure chronic ulcer of left ankle with fat layer exposed: Secondary | ICD-10-CM | POA: Diagnosis not present

## 2016-07-21 DIAGNOSIS — I87312 Chronic venous hypertension (idiopathic) with ulcer of left lower extremity: Secondary | ICD-10-CM | POA: Diagnosis not present

## 2016-07-21 DIAGNOSIS — I10 Essential (primary) hypertension: Secondary | ICD-10-CM | POA: Diagnosis not present

## 2016-07-22 NOTE — Progress Notes (Signed)
Musculoskeletal Adexa without tenderness or enlargement.. Digits and nails w/o clubbing, cyanosis, infection, petechiae, ischemia, or inflammatory conditions.. Integumentary (Hair, Skin) No suspicious lesions. No crepitus or fluctuance. No peri-wound warmth or erythema. No masses.Marland Kitchen Psychiatric Judgement and insight Intact.. No evidence of depression, anxiety, or agitation.. Notes the lymphedema has gone down significantly and the ulceration is now very superficial and there are small microperforations and her skin Electronic Signature(s) Signed: 07/21/2016 1:31:01 PM By: Christin Fudge MD, FACS Entered By: Christin Fudge on 07/21/2016 13:31:01 Jenna [name] ([id number]) -------------------------------------------------------------------------------- Physician Orders Details Patient Name: Jenna [name], Jenna [name] 07/21/2016 12:45 Date of Service: PM Medical Record [id number] Number: Patient Account Number: [id number] 1931-06-23 (82 y.o. Treating RN: Ahmed Prima Date of Birth/Sex: Female) Other Clinician: Primary Care Physician: Lujean Amel Treating Chayce Robbins Referring Physician: Lujean Amel Physician/Extender: Suella Grove in Treatment: 1 Verbal / Phone Orders: Yes ClinicianCarolyne Fiscal, Debi Read Back and Verified: Yes Diagnosis Coding Wound Cleansing Wound #1 Left,Anterior Lower Leg o Clean wound with Normal Saline. - for  clinic use o Cleanse wound with mild soap and water - for clinic use o May shower with protection. Anesthetic Wound #1 Left,Anterior Lower Leg o Topical Lidocaine 4% cream applied to wound bed prior to debridement - for clinic use Primary Wound Dressing Wound #1 Left,Anterior Lower Leg o Aquacel Ag Secondary Dressing Wound #1 Left,Anterior Lower Leg o ABD pad Dressing Change Frequency Wound #1 Left,Anterior Lower Leg o Change dressing every week Follow-up Appointments Wound #1 Left,Anterior Lower Leg o Return Appointment in 1 week. Edema Control Wound #1 Left,Anterior Lower Leg o 4-Layer Compression System - Left Lower Extremity - anchor with unna o Elevate legs to the level of the heart and pump ankles as often as possible Additional Orders / Instructions Wound #1 Left,Anterior Lower Leg NAO, CALLICOAT ([id number]) o Increase protein intake. Electronic Signature(s) Signed: 07/21/2016 4:20:18 PM By: Christin Fudge MD, FACS Signed: 07/21/2016 5:19:00 PM By: Alric Quan Entered By: Alric Quan on 07/21/2016 13:25:59 Jenna [name] ([id number]) -------------------------------------------------------------------------------- Problem List Details Patient Name: Jenna [name], Jenna [name] 07/21/2016 12:45 Date of Service: PM Medical Record [id number] Number: Patient Account Number: [id number] 1931/06/28 (82 y.o. 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 Treating RN: Ahmed Prima Date of Birth/Sex: Female) Other Clinician: Primary Care Physician: Lujean Amel Treating Alaya Iverson Referring Physician: Lujean Amel Physician/Extender: Weeks in Treatment: 1 Subjective Chief Complaint Information obtained from Patient Patient presents for treatment of an open ulcer due to venous insufficiency the left lower extremity she's had for several months now History of Present Illness (HPI) The following HPI elements were documented for the patient's wound: Location: left lower extremity in the medial ankle region Quality: Patient reports experiencing a dull pain to affected area(s). Severity:  Musculoskeletal Adexa without tenderness or enlargement.. Digits and nails w/o clubbing, cyanosis, infection, petechiae, ischemia, or inflammatory conditions.. Integumentary (Hair, Skin) No suspicious lesions. No crepitus or fluctuance. No peri-wound warmth or erythema. No masses.Marland Kitchen Psychiatric Judgement and insight Intact.. No evidence of depression, anxiety, or agitation.. Notes the lymphedema has gone down significantly and the ulceration is now very superficial and there are small microperforations and her skin Electronic Signature(s) Signed: 07/21/2016 1:31:01 PM By: Christin Fudge MD, FACS Entered By: Christin Fudge on 07/21/2016 13:31:01 Jenna [name] ([id number]) -------------------------------------------------------------------------------- Physician Orders Details Patient Name: Jenna [name], Jenna [name] 07/21/2016 12:45 Date of Service: PM Medical Record [id number] Number: Patient Account Number: [id number] 1931-06-23 (82 y.o. Treating RN: Ahmed Prima Date of Birth/Sex: Female) Other Clinician: Primary Care Physician: Lujean Amel Treating Chayce Robbins Referring Physician: Lujean Amel Physician/Extender: Suella Grove in Treatment: 1 Verbal / Phone Orders: Yes ClinicianCarolyne Fiscal, Debi Read Back and Verified: Yes Diagnosis Coding Wound Cleansing Wound #1 Left,Anterior Lower Leg o Clean wound with Normal Saline. - for  clinic use o Cleanse wound with mild soap and water - for clinic use o May shower with protection. Anesthetic Wound #1 Left,Anterior Lower Leg o Topical Lidocaine 4% cream applied to wound bed prior to debridement - for clinic use Primary Wound Dressing Wound #1 Left,Anterior Lower Leg o Aquacel Ag Secondary Dressing Wound #1 Left,Anterior Lower Leg o ABD pad Dressing Change Frequency Wound #1 Left,Anterior Lower Leg o Change dressing every week Follow-up Appointments Wound #1 Left,Anterior Lower Leg o Return Appointment in 1 week. Edema Control Wound #1 Left,Anterior Lower Leg o 4-Layer Compression System - Left Lower Extremity - anchor with unna o Elevate legs to the level of the heart and pump ankles as often as possible Additional Orders / Instructions Wound #1 Left,Anterior Lower Leg NAO, CALLICOAT ([id number]) o Increase protein intake. Electronic Signature(s) Signed: 07/21/2016 4:20:18 PM By: Christin Fudge MD, FACS Signed: 07/21/2016 5:19:00 PM By: Alric Quan Entered By: Alric Quan on 07/21/2016 13:25:59 Jenna [name] ([id number]) -------------------------------------------------------------------------------- Problem List Details Patient Name: Jenna [name], Jenna [name] 07/21/2016 12:45 Date of Service: PM Medical Record [id number] Number: Patient Account Number: [id number] 1931/06/28 (82 y.o. 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 Treating RN: Ahmed Prima Date of Birth/Sex: Female) Other Clinician: Primary Care Physician: Lujean Amel Treating Alaya Iverson Referring Physician: Lujean Amel Physician/Extender: Weeks in Treatment: 1 Subjective Chief Complaint Information obtained from Patient Patient presents for treatment of an open ulcer due to venous insufficiency the left lower extremity she's had for several months now History of Present Illness (HPI) The following HPI elements were documented for the patient's wound: Location: left lower extremity in the medial ankle region Quality: Patient reports experiencing a dull pain to affected area(s). Severity:  E66.01 - Morbid (severe) obesity due to excess calories Plan Wound Cleansing: Wound #1 Left,Anterior Lower Leg: Clean wound with Normal Saline. - for clinic use Cleanse wound with mild soap and water - for clinic use May shower with protection. Anesthetic: Wound #1 Left,Anterior Lower Leg: Topical Lidocaine 4% cream applied to wound bed prior to debridement - for clinic use Primary Wound Dressing: Wound #1 Left,Anterior Lower Leg: Aquacel Ag Secondary Dressing: Wound #1 Left,Anterior Lower  Leg: ABD pad Dressing Change Frequency: Wound #1 Left,Anterior Lower Leg: Change dressing every week Follow-up Appointments: Wound #1 Left,Anterior Lower Leg: Return Appointment in 1 week. Edema Control: Wound #1 Left,Anterior Lower Leg: 4-Layer Compression System - Left Lower Extremity - anchor with unna Elevate legs to the level of the heart and pump ankles as often as possible Additional Orders / Instructions: Wound #1 Left,Anterior Lower Leg: Increase protein intake. MAECI, POPIEL ([id number]) I have recommended: 1. Silver alginate and a 4-layer Profore wrap for her left lower extremity 2. Elevation and exercise 3. Review by her interventional radiologist to reschedule her endovenous ablation -- she has not yet done this 4. Order compression stockings of the 20-30 mm variety she will get this from [facility] -- asked her to buy these and bring them along with her next week 5. Regular visits to the wound care center Electronic Signature(s) Signed: 07/21/2016 1:32:27 PM By: Christin Fudge MD, FACS Entered By: Christin Fudge on 07/21/2016 13:32:27 Jenna [name], Jenna [name] ([id number]) -------------------------------------------------------------------------------- SuperBill Details Patient Name: Jenna [name] Date of Service: 07/21/2016 Medical Record Number: [id number] Patient Account Number: [id number] Date of Birth/Sex: Jun 21, 1931 (82 y.o. 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 and inflammation E66.01 Morbid (severe) obesity due to excess calories Facility Procedures CPT4:  Description Modifier Quantity Code [id number] (Facility Use Only) [phone] - [place] [id number] LWR LT 1 LEG Physician Procedures CPT4: Description Modifier Quantity Code [id number] - WC PHYS LEVEL 3 - EST PT 1 ICD-10 Description Diagnosis E11.622 Type 2 diabetes mellitus with other skin ulcer L97.322 Non-pressure chronic ulcer of left ankle with fat layer exposed I83.223  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 Digits and nails w/o clubbing, cyanosis, infection, petechiae, ischemia, or inflammatory conditions.. Integumentary (Hair, Skin) No suspicious lesions. No crepitus or fluctuance. No peri-wound warmth or erythema. No masses.Marland Kitchen Psychiatric Judgement and insight Intact.. No evidence of depression, anxiety, or agitation.. Notes the lymphedema has gone down significantly and the ulceration is now very superficial and there are small microperforations and her skin Electronic Signature(s) Signed: 07/21/2016 1:31:01 PM By: Christin Fudge MD, FACS Entered By: Christin Fudge on 07/21/2016 13:31:01 Jenna [name] ([id number]) -------------------------------------------------------------------------------- Physician Orders Details Patient Name: Jenna [name], Jenna [name] 07/21/2016 12:45 Date of Service: PM Medical Record [id number] Number: Patient Account Number: [id number] 1931-06-23 (82 y.o. Treating RN: Ahmed Prima Date of Birth/Sex: Female) Other Clinician: Primary Care Physician: Lujean Amel Treating Chayce Robbins Referring Physician: Lujean Amel Physician/Extender: Suella Grove in Treatment: 1 Verbal / Phone Orders: Yes ClinicianCarolyne Fiscal, Debi Read Back and Verified: Yes Diagnosis Coding Wound Cleansing Wound #1 Left,Anterior Lower Leg o Clean wound with Normal Saline. - for  clinic use o Cleanse wound with mild soap and water - for clinic use o May shower with protection. Anesthetic Wound #1 Left,Anterior Lower Leg o Topical Lidocaine 4% cream applied to wound bed prior to debridement - for clinic use Primary Wound Dressing Wound #1 Left,Anterior Lower Leg o Aquacel Ag Secondary Dressing Wound #1 Left,Anterior Lower Leg o ABD pad Dressing Change Frequency Wound #1 Left,Anterior Lower Leg o Change dressing every week Follow-up Appointments Wound #1 Left,Anterior Lower Leg o Return Appointment in 1 week. Edema Control Wound #1 Left,Anterior Lower Leg o 4-Layer Compression System - Left Lower Extremity - anchor with unna o Elevate legs to the level of the heart and pump ankles as often as possible Additional Orders / Instructions Wound #1 Left,Anterior Lower Leg NAO, CALLICOAT ([id number]) o Increase protein intake. Electronic Signature(s) Signed: 07/21/2016 4:20:18 PM By: Christin Fudge MD, FACS Signed: 07/21/2016 5:19:00 PM By: Alric Quan Entered By: Alric Quan on 07/21/2016 13:25:59 Jenna [name] ([id number]) -------------------------------------------------------------------------------- Problem List Details Patient Name: Jenna [name], Jenna [name] 07/21/2016 12:45 Date of Service: PM Medical Record [id number] Number: Patient Account Number: [id number] 1931/06/28 (82 y.o. 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 Treating RN: Ahmed Prima Date of Birth/Sex: Female) Other Clinician: Primary Care Physician: Lujean Amel Treating Alaya Iverson Referring Physician: Lujean Amel Physician/Extender: Weeks in Treatment: 1 Subjective Chief Complaint Information obtained from Patient Patient presents for treatment of an open ulcer due to venous insufficiency the left lower extremity she's had for several months now History of Present Illness (HPI) The following HPI elements were documented for the patient's wound: Location: left lower extremity in the medial ankle region Quality: Patient reports experiencing a dull pain to affected area(s). Severity:

## 2016-07-22 NOTE — Progress Notes (Signed)
1:0.008] [N/A:N/A] Volume (cm) : [1:0.001] [N/A:N/A] % Reduction in Area: [1:100.00%] [N/A:N/A] % Reduction in Volume: 100.00% [N/A:N/A] Classification: [1:Grade 1] [N/A:N/A] Exudate Amount: [1:Medium] [N/A:N/A] Exudate Type: [1:Serous] [N/A:N/A] Exudate Color: [1:amber] [N/A:N/A] Wound Margin: [1:Flat and Intact] [N/A:N/A] Granulation Amount: [1:Large (67-100%)] [N/A:N/A] Granulation Quality: [1:Red] [N/A:N/A] Necrotic Amount: [1:None Present (0%)] [N/A:N/A] Exposed Structures: [1:Fascia: No Fat: No Tendon: No Muscle: No] [N/A:N/A] Joint: No Bone: No Limited to Skin Breakdown Epithelialization: None N/A N/A Periwound Skin Texture: Edema: Yes N/A N/A Periwound Skin Moist: Yes N/A N/A Moisture: Periwound Skin Color: Erythema: Yes N/A N/A Erythema Location: Circumferential N/A N/A Temperature: No Abnormality N/A N/A Tenderness on Yes N/A N/A Palpation: Wound Preparation: Ulcer Cleansing: Other: N/A N/A soap and water Topical Anesthetic Applied: None Treatment  Notes Electronic Signature(s) Signed: 07/21/2016 5:19:00 PM By: Alric Quan Entered By: Alric Quan on 07/21/2016 13:25:37 Jenna [name] ([id number]) -------------------------------------------------------------------------------- [place] Details Patient Name: Jenna [name] Date of Service: 07/21/2016 12:45 PM Medical Record Number: [id number] Patient Account Number: [id number] Date of Birth/Sex: 05/04/1931 (82 y.o. Female) Treating RN: Carolyne Fiscal, Debi Primary Care Physician: Lujean Amel Other Clinician: Referring Physician: Lujean Amel Treating Physician/Extender: Frann Rider in Treatment: 1 Active Inactive Orientation to the Wound Care Program Nursing Diagnoses: Knowledge deficit related to the wound healing center program Goals: Patient/caregiver will verbalize understanding of the [place] Program Date Initiated: 07/14/2016 Goal Status: Active Interventions: Provide education on orientation to the wound center Notes: Pain, Acute or Chronic Nursing Diagnoses: Pain, acute or chronic: actual or potential Potential alteration in comfort, pain Goals: Patient will verbalize adequate pain control and receive pain control interventions during procedures as needed Date Initiated: 07/14/2016 Goal Status: Active Interventions: Assess comfort goal upon admission Complete pain assessment as per visit requirements Notes: Venous Leg Ulcer Nursing Diagnoses: Knowledge deficit related to disease process and management TREYA, ALTER ([id number]) Goals: Patient will maintain optimal edema control Date Initiated: 07/14/2016 Goal Status: Active Interventions: Compression as ordered Notes: Wound/Skin Impairment Nursing Diagnoses: Impaired tissue integrity Goals: Ulcer/skin breakdown will have a volume reduction of 30% by week 4 Date Initiated: 07/14/2016 Goal Status: Active Ulcer/skin breakdown will have a volume  reduction of 50% by week 8 Date Initiated: 07/14/2016 Goal Status: Active Ulcer/skin breakdown will have a volume reduction of 80% by week 12 Date Initiated: 07/14/2016 Goal Status: Active Interventions: Assess patient/caregiver ability to obtain necessary supplies Assess ulceration(s) every visit Notes: Electronic Signature(s) Signed: 07/21/2016 5:19:00 PM By: Alric Quan Entered By: Alric Quan on 07/21/2016 13:25:32 Jenna [name] ([id number]) -------------------------------------------------------------------------------- Pain Assessment Details Patient Name: Jenna [name] Date of Service: 07/21/2016 12:45 PM Medical Record Number: [id number] Patient Account Number: [id number] Date of Birth/Sex: 10-12-1931 (82 y.o. Female) Treating RN: Ahmed Prima Primary Care Physician: Lujean Amel Other Clinician: Referring Physician: Lujean Amel Treating Physician/Extender: Frann Rider in Treatment: 1 Active Problems Location of Pain Severity and Description of Pain Patient Has Paino No Site Locations With Dressing Change: No Pain Management and Medication Current Pain Management: Electronic Signature(s) Signed: 07/21/2016 5:19:00 PM By: Alric Quan Entered By: Alric Quan on 07/21/2016 13:10:42 Jenna [name] ([id number]) -------------------------------------------------------------------------------- Patient/Caregiver Education Details Patient Name: Jenna [name] Date of Service: 07/21/2016 12:45 PM Medical Record Number: [id number] Patient Account Number: [id number] Date of Birth/Gender: May 19, 1931 (82 y.o. Female) Treating RN: Ahmed Prima Primary Care Physician: Lujean Amel Other Clinician: Referring Physician: Lujean Amel Treating Physician/Extender: Frann Rider in Treatment: 1 Education Assessment Education Provided To: Patient Education Topics Provided  KAITLINN, PIECHOWSKI ([id number]) Visit Report for 07/21/2016 Arrival Information Details Patient Name: Jenna [name]. Date of Service: 07/21/2016 12:45 PM Medical Record Number: [id number] Patient Account Number: [id number] Date of Birth/Sex: 1931-04-18 (82 y.o. Female) Treating RN: Ahmed Prima Primary Care Physician: Lujean Amel Other Clinician: Referring Physician: Lujean Amel Treating Physician/Extender: Frann Rider in Treatment: 1 Visit Information History Since Last Visit All ordered tests and consults were completed: No Patient Arrived: Kasandra Knudsen Added or deleted any medications: No Arrival Time: 13:10 Any new allergies or adverse reactions: No Accompanied By: self Had a fall or experienced change in No Transfer Assistance: None activities of daily living that may affect Patient Identification Verified: Yes risk of falls: Secondary Verification Process Completed: Yes Signs or symptoms of abuse/neglect since last No Patient Requires Transmission-Based No visito Precautions: Hospitalized since last visit: No Patient Has Alerts: Yes Pain Present Now: No Patient Alerts: DM II Electronic Signature(s) Signed: 07/21/2016 5:19:00 PM By: Alric Quan Entered By: Alric Quan on 07/21/2016 13:10:33 Jenna [name] ([id number]) -------------------------------------------------------------------------------- Encounter Discharge Information Details Patient Name: Jenna [name] Date of Service: 07/21/2016 12:45 PM Medical Record Number: [id number] Patient Account Number: [id number] Date of Birth/Sex: Mar 19, 1931 (82 y.o. Female) Treating RN: Ahmed Prima Primary Care Physician: Lujean Amel Other Clinician: Referring Physician: Lujean Amel Treating Physician/Extender: Frann Rider in Treatment: 1 Encounter Discharge Information Items Discharge Pain Level: 0 Discharge Condition: Stable Ambulatory Status: Cane Discharge Destination:  Home Transportation: Private Auto Accompanied By: self Schedule Follow-up Appointment: Yes Medication Reconciliation completed Yes and provided to Patient/Care Taurus Willis: Provided on Clinical Summary of Care: 07/21/2016 Form Type Recipient Paper Patient JD Electronic Signature(s) Signed: 07/21/2016 1:42:45 PM By: Ruthine Dose Entered By: Ruthine Dose on 07/21/2016 13:42:45 Jenna [name] ([id number]) -------------------------------------------------------------------------------- Lower Extremity Assessment Details Patient Name: Jenna [name] Date of Service: 07/21/2016 12:45 PM Medical Record Number: [id number] Patient Account Number: [id number] Date of Birth/Sex: 02-27-1931 (82 y.o. Female) Treating RN: Carolyne Fiscal, Debi Primary Care Physician: Lujean Amel Other Clinician: Referring Physician: Lujean Amel Treating Physician/Extender: Frann Rider in Treatment: 1 Edema Assessment Assessed: [Left: No] [Right: No] Edema: [Left: Ye] [Right: s] Calf Left: Right: Point of Measurement: 32 cm From Medial Instep 42.5 cm cm Ankle Left: Right: Point of Measurement: 10 cm From Medial Instep 23.6 cm cm Vascular Assessment Pulses: Posterior Tibial Dorsalis Pedis Palpable: [Left:Yes] Extremity colors, hair growth, and conditions: Extremity Color: [Left:Hyperpigmented] Temperature of Extremity: [Left:Warm] Capillary Refill: [Left:< 3 seconds] Toe Nail Assessment Left: Right: Thick: No Discolored: No Deformed: No Improper Length and Hygiene: No Electronic Signature(s) Signed: 07/21/2016 5:19:00 PM By: Alric Quan Entered By: Alric Quan on 07/21/2016 13:21:20 Jenna [name] ([id number]) -------------------------------------------------------------------------------- Multi Wound Chart Details Patient Name: Jenna [name] Date of Service: 07/21/2016 12:45 PM Medical Record Number: [id number] Patient Account Number: [id number] Date of Birth/Sex:  Jan 04, 1931 (82 y.o. Female) Treating RN: Carolyne Fiscal, Debi Primary Care Physician: Lujean Amel Other Clinician: Referring Physician: Lujean Amel Treating Physician/Extender: Frann Rider in Treatment: 1 Vital Signs Height(in): 61 Pulse(bpm): 65 Weight(lbs): 225.2 Blood Pressure 183/71 (mmHg): Body Mass Index(BMI): 43 Temperature(F): 98.1 Respiratory Rate 18 (breaths/min): Photos: [1:No Photos] [N/A:N/A] Wound Location: [1:Left Lower Leg - Anterior] [N/A:N/A] Wounding Event: [1:Blister] [N/A:N/A] Primary Etiology: [1:Diabetic Wound/Ulcer of the Lower Extremity] [N/A:N/A] Secondary Etiology: [1:Venous Leg Ulcer] [N/A:N/A] Comorbid History: [1:Cataracts, Glaucoma, Hypertension, Type II Diabetes, Osteoarthritis] [N/A:N/A] Date Acquired: [1:06/30/2016] [N/A:N/A] Weeks of Treatment: [1:1] [N/A:N/A] Wound Status: [1:Open] [N/A:N/A] Measurements L x W x D 0.1x0.1x0.1 [N/A:N/A] (cm) Area (cm) : [  1:0.008] [N/A:N/A] Volume (cm) : [1:0.001] [N/A:N/A] % Reduction in Area: [1:100.00%] [N/A:N/A] % Reduction in Volume: 100.00% [N/A:N/A] Classification: [1:Grade 1] [N/A:N/A] Exudate Amount: [1:Medium] [N/A:N/A] Exudate Type: [1:Serous] [N/A:N/A] Exudate Color: [1:amber] [N/A:N/A] Wound Margin: [1:Flat and Intact] [N/A:N/A] Granulation Amount: [1:Large (67-100%)] [N/A:N/A] Granulation Quality: [1:Red] [N/A:N/A] Necrotic Amount: [1:None Present (0%)] [N/A:N/A] Exposed Structures: [1:Fascia: No Fat: No Tendon: No Muscle: No] [N/A:N/A] Joint: No Bone: No Limited to Skin Breakdown Epithelialization: None N/A N/A Periwound Skin Texture: Edema: Yes N/A N/A Periwound Skin Moist: Yes N/A N/A Moisture: Periwound Skin Color: Erythema: Yes N/A N/A Erythema Location: Circumferential N/A N/A Temperature: No Abnormality N/A N/A Tenderness on Yes N/A N/A Palpation: Wound Preparation: Ulcer Cleansing: Other: N/A N/A soap and water Topical Anesthetic Applied: None Treatment  Notes Electronic Signature(s) Signed: 07/21/2016 5:19:00 PM By: Alric Quan Entered By: Alric Quan on 07/21/2016 13:25:37 Jenna [name] ([id number]) -------------------------------------------------------------------------------- [place] Details Patient Name: Jenna [name] Date of Service: 07/21/2016 12:45 PM Medical Record Number: [id number] Patient Account Number: [id number] Date of Birth/Sex: 05/04/1931 (82 y.o. Female) Treating RN: Carolyne Fiscal, Debi Primary Care Physician: Lujean Amel Other Clinician: Referring Physician: Lujean Amel Treating Physician/Extender: Frann Rider in Treatment: 1 Active Inactive Orientation to the Wound Care Program Nursing Diagnoses: Knowledge deficit related to the wound healing center program Goals: Patient/caregiver will verbalize understanding of the [place] Program Date Initiated: 07/14/2016 Goal Status: Active Interventions: Provide education on orientation to the wound center Notes: Pain, Acute or Chronic Nursing Diagnoses: Pain, acute or chronic: actual or potential Potential alteration in comfort, pain Goals: Patient will verbalize adequate pain control and receive pain control interventions during procedures as needed Date Initiated: 07/14/2016 Goal Status: Active Interventions: Assess comfort goal upon admission Complete pain assessment as per visit requirements Notes: Venous Leg Ulcer Nursing Diagnoses: Knowledge deficit related to disease process and management TREYA, ALTER ([id number]) Goals: Patient will maintain optimal edema control Date Initiated: 07/14/2016 Goal Status: Active Interventions: Compression as ordered Notes: Wound/Skin Impairment Nursing Diagnoses: Impaired tissue integrity Goals: Ulcer/skin breakdown will have a volume reduction of 30% by week 4 Date Initiated: 07/14/2016 Goal Status: Active Ulcer/skin breakdown will have a volume  reduction of 50% by week 8 Date Initiated: 07/14/2016 Goal Status: Active Ulcer/skin breakdown will have a volume reduction of 80% by week 12 Date Initiated: 07/14/2016 Goal Status: Active Interventions: Assess patient/caregiver ability to obtain necessary supplies Assess ulceration(s) every visit Notes: Electronic Signature(s) Signed: 07/21/2016 5:19:00 PM By: Alric Quan Entered By: Alric Quan on 07/21/2016 13:25:32 Jenna [name] ([id number]) -------------------------------------------------------------------------------- Pain Assessment Details Patient Name: Jenna [name] Date of Service: 07/21/2016 12:45 PM Medical Record Number: [id number] Patient Account Number: [id number] Date of Birth/Sex: 10-12-1931 (82 y.o. Female) Treating RN: Ahmed Prima Primary Care Physician: Lujean Amel Other Clinician: Referring Physician: Lujean Amel Treating Physician/Extender: Frann Rider in Treatment: 1 Active Problems Location of Pain Severity and Description of Pain Patient Has Paino No Site Locations With Dressing Change: No Pain Management and Medication Current Pain Management: Electronic Signature(s) Signed: 07/21/2016 5:19:00 PM By: Alric Quan Entered By: Alric Quan on 07/21/2016 13:10:42 Jenna [name] ([id number]) -------------------------------------------------------------------------------- Patient/Caregiver Education Details Patient Name: Jenna [name] Date of Service: 07/21/2016 12:45 PM Medical Record Number: [id number] Patient Account Number: [id number] Date of Birth/Gender: May 19, 1931 (82 y.o. Female) Treating RN: Ahmed Prima Primary Care Physician: Lujean Amel Other Clinician: Referring Physician: Lujean Amel Treating Physician/Extender: Frann Rider in Treatment: 1 Education Assessment Education Provided To: Patient Education Topics Provided

## 2016-07-28 ENCOUNTER — Encounter: Payer: Medicare Other | Admitting: Surgery

## 2016-07-28 DIAGNOSIS — Z09 Encounter for follow-up examination after completed treatment for conditions other than malignant neoplasm: Secondary | ICD-10-CM | POA: Diagnosis not present

## 2016-07-28 DIAGNOSIS — I83223 Varicose veins of left lower extremity with both ulcer of ankle and inflammation: Secondary | ICD-10-CM | POA: Diagnosis not present

## 2016-07-28 DIAGNOSIS — E785 Hyperlipidemia, unspecified: Secondary | ICD-10-CM | POA: Diagnosis not present

## 2016-07-28 DIAGNOSIS — E11622 Type 2 diabetes mellitus with other skin ulcer: Secondary | ICD-10-CM | POA: Diagnosis not present

## 2016-07-28 DIAGNOSIS — I10 Essential (primary) hypertension: Secondary | ICD-10-CM | POA: Diagnosis not present

## 2016-07-28 DIAGNOSIS — Z872 Personal history of diseases of the skin and subcutaneous tissue: Secondary | ICD-10-CM | POA: Diagnosis not present

## 2016-07-28 DIAGNOSIS — L97322 Non-pressure chronic ulcer of left ankle with fat layer exposed: Secondary | ICD-10-CM | POA: Diagnosis not present

## 2016-07-29 NOTE — Progress Notes (Signed)
AVYA, DOLLINS ([id number]) Visit Report for 07/28/2016 Arrival Information Details Patient Name: Jenna [name], Jenna [name]. Date of Service: 07/28/2016 10:45 AM Medical Record Number: [id number] Patient Account Number: [id number] Date of Birth/Sex: [birth year] (82 y.o. Female) Treating RN: Ahmed Prima Primary Care Physician: Lujean Amel Other Clinician: Referring Physician: Lujean Amel Treating Physician/Extender: Frann Rider in Treatment: 2 Visit Information History Since Last Visit All ordered tests and consults were completed: No Patient Arrived: Jenna [name] Added or deleted any medications: No Arrival Time: 10:50 Any new allergies or adverse reactions: No Accompanied By: daughter Had a fall or experienced change in No Transfer Assistance: None activities of daily living that may affect Patient Identification Verified: Yes risk of falls: Secondary Verification Process Yes Signs or symptoms of abuse/neglect since last No Completed: visito Patient Requires Transmission-Based No Hospitalized since last visit: No Precautions: Pain Present Now: No Patient Has Alerts: Yes Patient Alerts: DM II Electronic Signature(s) Signed: 07/28/2016 5:30:24 PM By: Alric Quan Entered By: Alric Quan on 07/28/2016 10:50:53 Jenna [name] ([id number]) -------------------------------------------------------------------------------- Encounter Discharge Information Details Patient Name: Jenna [name] Date of Service: 07/28/2016 10:45 AM Medical Record Number: [id number] Patient Account Number: [id number] Date of Birth/Sex: [birth year] (82 y.o. Female) Treating RN: Ahmed Prima Primary Care Physician: Lujean Amel Other Clinician: Referring Physician: Lujean Amel Treating Physician/Extender: Frann Rider in Treatment: 2 Encounter Discharge Information Items Discharge Pain Level: 0 Discharge Condition: Stable Ambulatory Status: Cane Discharge Destination:  Home Transportation: Private Auto Accompanied By: daughter Schedule Follow-up Appointment: No Medication Reconciliation completed Yes and provided to Patient/Care Takara Sermons: Provided on Clinical Summary of Care: 07/28/2016 Form Type Recipient Paper Patient JD Electronic Signature(s) Signed: 07/28/2016 11:17:48 AM By: Ruthine Dose Entered By: Ruthine Dose on 07/28/2016 11:17:48 Jenna [name] ([id number]) -------------------------------------------------------------------------------- Lower Extremity Assessment Details Patient Name: Jenna [name] Date of Service: 07/28/2016 10:45 AM Medical Record Number: [id number] Patient Account Number: [id number] Date of Birth/Sex: Jan 21, 1931 (82 y.o. Female) Treating RN: Ahmed Prima Primary Care Physician: Lujean Amel Other Clinician: Referring Physician: Lujean Amel Treating Physician/Extender: Frann Rider in Treatment: 2 Vascular Assessment Pulses: Posterior Tibial Dorsalis Pedis Palpable: [Left:Yes] Extremity colors, hair growth, and conditions: Extremity Color: [Left:Hyperpigmented] Temperature of Extremity: [Left:Warm] Capillary Refill: [Left:< 3 seconds] Toe Nail Assessment Left: Right: Thick: No Discolored: No Deformed: No Improper Length and Hygiene: No Electronic Signature(s) Signed: 07/28/2016 5:30:24 PM By: Alric Quan Entered By: Alric Quan on 07/28/2016 10:58:54 Jenna [name] ([id number]) -------------------------------------------------------------------------------- Multi Wound Chart Details Patient Name: Jenna [name] Date of Service: 07/28/2016 10:45 AM Medical Record Number: [id number] Patient Account Number: [id number] Date of Birth/Sex: 1931/07/11 (82 y.o. Female) Treating RN: Carolyne Fiscal, Debi Primary Care Physician: Lujean Amel Other Clinician: Referring Physician: Lujean Amel Treating Physician/Extender: Frann Rider in Treatment: 2 Vital  Signs Height(in): 61 Pulse(bpm): 80 Weight(lbs): 225.2 Blood Pressure 171/70 (mmHg): Body Mass Index(BMI): 43 Temperature(F): 97.9 Respiratory Rate 18 (breaths/min): Photos: [1:No Photos] [N/A:N/A] Wound Location: [1:Left, Anterior Lower Leg] [N/A:N/A] Wounding Event: [1:Blister] [N/A:N/A] Primary Etiology: [1:Diabetic Wound/Ulcer of the Lower Extremity] [N/A:N/A] Secondary Etiology: [1:Venous Leg Ulcer] [N/A:N/A] Date Acquired: [1:06/30/2016] [N/A:N/A] Weeks of Treatment: [1:2] [N/A:N/A] Wound Status: [1:Healed - Epithelialized] [N/A:N/A] Measurements L x W x D 0x0x0 [N/A:N/A] (cm) Area (cm) : [1:0] [N/A:N/A] Volume (cm) : [1:0] [N/A:N/A] % Reduction in Area: [1:100.00%] [N/A:N/A] % Reduction in Volume: 100.00% [N/A:N/A] Classification: [1:Grade 1] [N/A:N/A] Periwound Skin Texture: No Abnormalities Noted [N/A:N/A] Periwound Skin [1:No Abnormalities Noted] [N/A:N/A] Moisture: Periwound Skin Color: No Abnormalities Noted [N/A:N/A] Tenderness on [1:No] [N/A:N/A]  Treatment Notes Electronic Signature(s) Signed: 07/28/2016 5:30:24 PM By: Alric Quan Entered By: Alric Quan on 07/28/2016 11:11:22 SHYONNA, KNUPPEL ([id number], GRENINGER ([id number]) -------------------------------------------------------------------------------- [place] Details Patient Name: Jenna [name]. Date of Service: 07/28/2016 10:45 AM Medical Record Number: [id number] Patient Account Number: [id number] Date of Birth/Sex: August 30, 1931 (82 y.o. Female) Treating RN: Ahmed Prima Primary Care Physician: Lujean Amel Other Clinician: Referring Physician: Lujean Amel Treating Physician/Extender: Frann Rider in Treatment: 2 Active Inactive Electronic Signature(s) Signed: 07/28/2016 5:30:24 PM By: Alric Quan Entered By: Alric Quan on 07/28/2016 11:11:15 Jenna [name]  ([id number]) -------------------------------------------------------------------------------- Pain Assessment Details Patient Name: Jenna [name] Date of Service: 07/28/2016 10:45 AM Medical Record Number: [id number] Patient Account Number: [id number] Date of Birth/Sex: 1931/01/11 (82 y.o. Female) Treating RN: Ahmed Prima Primary Care Physician: Lujean Amel Other Clinician: Referring Physician: Lujean Amel Treating Physician/Extender: Frann Rider in Treatment: 2 Active Problems Location of Pain Severity and Description of Pain Patient Has Paino No Site Locations With Dressing Change: No Pain Management and Medication Current Pain Management: Electronic Signature(s) Signed: 07/28/2016 5:30:24 PM By: Alric Quan Entered By: Alric Quan on 07/28/2016 10:51:49 Jenna [name] ([id number]) -------------------------------------------------------------------------------- Patient/Caregiver Education Details Patient Name: Jenna [name] Date of Service: 07/28/2016 10:45 AM Medical Record Number: [id number] Patient Account Number: [id number] Date of Birth/Gender: [birth year] (82 y.o. Female) Treating RN: Ahmed Prima Primary Care Physician: Lujean Amel Other Clinician: Referring Physician: Lujean Amel Treating Physician/Extender: Frann Rider in Treatment: 2 Education Assessment Education Provided To: Patient Education Topics Provided Wound/Skin Impairment: Other: Keep area clean, dry and moisturized. Please wear your compression hose everyday, Handouts: take off at night. Methods: Demonstration, Explain/Verbal Responses: State content correctly Electronic Signature(s) Signed: 07/28/2016 5:30:24 PM By: Alric Quan Entered By: Alric Quan on 07/28/2016 11:13:02 Jenna [name] ([id number]) -------------------------------------------------------------------------------- Wound Assessment Details Patient Name: Jenna [name] Date of Service: 07/28/2016 10:45 AM Medical Record Number: [id number] Patient Account Number: [id number] Date of Birth/Sex: 1931/11/06 (82 y.o. Female) Treating RN: Carolyne Fiscal, Debi Primary Care Physician: Lujean Amel Other Clinician: Referring Physician: Lujean Amel Treating Physician/Extender: Frann Rider in Treatment: 2 Wound Status Wound Number: 1 Primary Etiology: Diabetic Wound/Ulcer of the Lower Extremity Wound Location: Left, Anterior Lower Leg Secondary Venous Leg Ulcer Wounding Event: Blister Etiology: Date Acquired: 06/30/2016 Wound Status: Healed - Epithelialized Weeks Of Treatment: 2 Clustered Wound: No Photos Photo Uploaded By: Alric Quan on 07/28/2016 17:12:56 Wound Measurements Length: (cm) 0 % Reduction Width: (cm) 0 % Reduction Depth: (cm) 0 Area: (cm) 0 Volume: (cm) 0 in Area: 100% in Volume: 100% Wound Description Classification: Grade 1 Periwound Skin Texture Texture Color No Abnormalities Noted: No No Abnormalities Noted: No Moisture No Abnormalities Noted: No Electronic Signature(s) Signed: 07/28/2016 5:30:24 PM By: Veryl Speak ([id number]) Entered By: Alric Quan on 07/28/2016 11:10:53 Jenna [name] ([id number]) -------------------------------------------------------------------------------- [place] Details Patient Name: Jenna [name] Date of Service: 07/28/2016 10:45 AM Medical Record Number: [id number] Patient Account Number: [id number] Date of Birth/Sex: 14-Mar-1931 (82 y.o. Female) Treating RN: Carolyne Fiscal, Debi Primary Care Physician: Lujean Amel Other Clinician: Referring Physician: Lujean Amel Treating Physician/Extender: Frann Rider in Treatment: 2 Vital Signs Time Taken: 10:51 Temperature (F): 97.9 Height (in): 61 Pulse (bpm): 80 Weight (lbs): 225.2 Respiratory Rate (breaths/min): 18 Body Mass Index (BMI): 42.5 Blood Pressure (mmHg):  171/70 Reference Range: 80 - 120 mg / dl Electronic Signature(s) Signed: 07/28/2016 5:30:24 PM By: Alric Quan Entered By: Alric Quan on 07/28/2016 10:52:49

## 2016-07-29 NOTE — Progress Notes (Signed)
Breast tissue WNL, no masses, lumps, or tenderness.. Lymphatic No adneopathy. No adenopathy. No adenopathy. Musculoskeletal Adexa without tenderness or enlargement.. Digits and nails w/o clubbing, cyanosis, infection, petechiae, ischemia, or inflammatory conditions.. Integumentary (Hair, Skin) No suspicious lesions. No crepitus or fluctuance. No peri-wound warmth or erythema. No masses.Marland Kitchen Psychiatric Judgement and insight Intact.. No evidence of depression, anxiety, or agitation.. Notes The lymphedema is completely better and the ulceration is completely healed and there is no evidence of any open wound. She continues to have a lot of dry scaly skin. Electronic Signature(s) Signed: 07/28/2016 4:24:16 PM By: Christin Fudge MD, FACS Entered By: Christin Fudge on 07/28/2016 11:21:49 Jenna [name] ([id number]) -------------------------------------------------------------------------------- Physician Orders Details Patient Name: Jenna [name], Jenna [name] 07/28/2016 10:45 Date of Service: AM Medical Record [id number] Number: Patient Account Number: [id number] 05/08/1931 (82 y.o. Treating RN: Ahmed Prima Date of Birth/Sex: Female) Other Clinician: Primary Care Physician: Lujean Amel Treating Merary Garguilo Referring Physician: Lujean Amel Physician/Extender: Suella Grove in Treatment: 2 Verbal / Phone Orders: Yes Clinician: Carolyne Fiscal, Debi Read  Back and Verified: Yes Diagnosis Coding Discharge From [facility] o Discharge from [place] area clean, dry and moisturized. Please wear your compression hose everyday, take off at night. Call the office if you have any questions or concerns. Electronic Signature(s) Signed: 07/28/2016 4:24:16 PM By: Christin Fudge MD, FACS Signed: 07/28/2016 5:30:24 PM By: Alric Quan Entered By: Alric Quan on 07/28/2016 11:12:29 Jenna [name] ([id number]) -------------------------------------------------------------------------------- Problem List Details Patient Name: Jenna [name], Jenna [name] 07/28/2016 10:45 Date of Service: AM Medical Record [id number] Number: Patient Account Number: [id number] 17-Aug-1931 (82 y.o. 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 07/14/2016 Yes exposed [id number] Varicose veins of left lower extremity with both ulcer of 07/14/2016 Yes ankle and inflammation E66.01 Morbid (severe) obesity due to excess calories 07/14/2016 Yes Inactive Problems Resolved Problems Electronic Signature(s) Signed: 07/28/2016 4:24:16 PM By: Christin Fudge MD, FACS Entered By: Christin Fudge on 07/28/2016 11:20:43 Jenna [name] ([id number]) -------------------------------------------------------------------------------- Progress Note Details Patient Name: Jenna [name] 07/28/2016 10:45 Date of Service: AM Medical Record [id number] Number: Patient Account Number: [id number] 1931/01/19 (82 y.o. Treating RN: Ahmed Prima Date of Birth/Sex: Female) Other Clinician: Primary Care  Physician: Lujean Amel Treating Jackalynn Art Referring Physician: Lujean Amel Physician/Extender: Weeks in Treatment: 2 Subjective Chief Complaint Information obtained from Patient Patient presents for treatment of an open ulcer due to venous insufficiency the left lower extremity she's had for several months now History of Present Illness (HPI) The following HPI elements were documented for the patient's wound: Location: left lower extremity in the medial ankle region Quality: Patient reports experiencing a dull pain to affected area(s). Severity: Patient states wound are getting worse. Duration: Patient has had the wound for > 3 months prior to seeking treatment at the wound center Timing: Pain in wound is Intermittent (comes and goes Context: The wound appeared gradually over time Modifying Factors: Other treatment(s) tried include:[name] Associated Signs and Symptoms: Patient reports having increase swelling. 82 year old patient with a past medical history significant for venous stasis ulceration and diabetes mellitus was recently admitted to the hospital between 07/07/2016 and 07/09/2016. She was wound to have a  Breast tissue WNL, no masses, lumps, or tenderness.. Lymphatic No adneopathy. No adenopathy. No adenopathy. Musculoskeletal Adexa without tenderness or enlargement.. Digits and nails w/o clubbing, cyanosis, infection, petechiae, ischemia, or inflammatory conditions.. Integumentary (Hair, Skin) No suspicious lesions. No crepitus or fluctuance. No peri-wound warmth or erythema. No masses.Marland Kitchen Psychiatric Judgement and insight Intact.. No evidence of depression, anxiety, or agitation.. Notes The lymphedema is completely better and the ulceration is completely healed and there is no evidence of any open wound. She continues to have a lot of dry scaly skin. Electronic Signature(s) Signed: 07/28/2016 4:24:16 PM By: Christin Fudge MD, FACS Entered By: Christin Fudge on 07/28/2016 11:21:49 Jenna [name] ([id number]) -------------------------------------------------------------------------------- Physician Orders Details Patient Name: Jenna [name], Jenna [name] 07/28/2016 10:45 Date of Service: AM Medical Record [id number] Number: Patient Account Number: [id number] 05/08/1931 (82 y.o. Treating RN: Ahmed Prima Date of Birth/Sex: Female) Other Clinician: Primary Care Physician: Lujean Amel Treating Merary Garguilo Referring Physician: Lujean Amel Physician/Extender: Suella Grove in Treatment: 2 Verbal / Phone Orders: Yes Clinician: Carolyne Fiscal, Debi Read  Back and Verified: Yes Diagnosis Coding Discharge From [facility] o Discharge from [place] area clean, dry and moisturized. Please wear your compression hose everyday, take off at night. Call the office if you have any questions or concerns. Electronic Signature(s) Signed: 07/28/2016 4:24:16 PM By: Christin Fudge MD, FACS Signed: 07/28/2016 5:30:24 PM By: Alric Quan Entered By: Alric Quan on 07/28/2016 11:12:29 Jenna [name] ([id number]) -------------------------------------------------------------------------------- Problem List Details Patient Name: Jenna [name], Jenna [name] 07/28/2016 10:45 Date of Service: AM Medical Record [id number] Number: Patient Account Number: [id number] 17-Aug-1931 (82 y.o. 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 07/14/2016 Yes exposed [id number] Varicose veins of left lower extremity with both ulcer of 07/14/2016 Yes ankle and inflammation E66.01 Morbid (severe) obesity due to excess calories 07/14/2016 Yes Inactive Problems Resolved Problems Electronic Signature(s) Signed: 07/28/2016 4:24:16 PM By: Christin Fudge MD, FACS Entered By: Christin Fudge on 07/28/2016 11:20:43 Jenna [name] ([id number]) -------------------------------------------------------------------------------- Progress Note Details Patient Name: Jenna [name] 07/28/2016 10:45 Date of Service: AM Medical Record [id number] Number: Patient Account Number: [id number] 1931/01/19 (82 y.o. Treating RN: Ahmed Prima Date of Birth/Sex: Female) Other Clinician: Primary Care  Physician: Lujean Amel Treating Jackalynn Art Referring Physician: Lujean Amel Physician/Extender: Weeks in Treatment: 2 Subjective Chief Complaint Information obtained from Patient Patient presents for treatment of an open ulcer due to venous insufficiency the left lower extremity she's had for several months now History of Present Illness (HPI) The following HPI elements were documented for the patient's wound: Location: left lower extremity in the medial ankle region Quality: Patient reports experiencing a dull pain to affected area(s). Severity: Patient states wound are getting worse. Duration: Patient has had the wound for > 3 months prior to seeking treatment at the wound center Timing: Pain in wound is Intermittent (comes and goes Context: The wound appeared gradually over time Modifying Factors: Other treatment(s) tried include:[name] Associated Signs and Symptoms: Patient reports having increase swelling. 82 year old patient with a past medical history significant for venous stasis ulceration and diabetes mellitus was recently admitted to the hospital between 07/07/2016 and 07/09/2016. She was wound to have a  MERLA, MENDIAS ([id number]) Visit Report for 07/28/2016 Chief Complaint Document Details Patient Name: KANETHA, PIRO 07/28/2016 10:45 Date of Service: AM Medical Record [id number] Number: Patient Account Number: [id number] [birth year] (82 y.o. Treating RN: Ahmed Prima Date of Birth/Sex: Female) Other Clinician: Primary Care Physician: Lujean Amel Treating Christin Fudge Referring Physician: Lujean Amel Physician/Extender: Weeks in Treatment: 2 Information Obtained from: Patient Chief Complaint Patient presents for treatment of an open ulcer due to venous insufficiency the left lower extremity she's had for several months now Electronic Signature(s) Signed: 07/28/2016 4:24:16 PM By: Christin Fudge MD, FACS Entered By: Christin Fudge on 07/28/2016 11:21:03 Jenna [name] ([id number]) -------------------------------------------------------------------------------- HPI Details Patient Name: KARIANNE, TOLLEFSEN 07/28/2016 10:45 Date of Service: AM Medical Record [id number] Number: Patient Account Number: [id number] 1931-06-21 (82 y.o. Treating RN: Ahmed Prima Date of Birth/Sex: Female) Other Clinician: Primary Care Physician: Lujean Amel Treating Kiari Hosmer Referring Physician: Lujean Amel Physician/Extender: Weeks in Treatment: 2 History of Present Illness Location: left lower extremity in the medial ankle region Quality: Patient reports experiencing a dull pain to affected area(s). Severity: Patient states wound are getting worse. Duration: Patient has had the wound for > 3 months prior to seeking treatment at the wound center Timing: Pain in wound is Intermittent (comes and goes Context: The wound appeared gradually over time Modifying Factors: Other treatment(s) tried include:[name] Associated Signs and Symptoms: Patient reports having increase swelling. HPI Description: 82 year old patient  with a past medical history significant for venous stasis ulceration and diabetes mellitus was recently admitted to the hospital between 07/07/2016 and 07/09/2016. She was wound to have a nonpurulent cellulitis of the left lower extremity and was started on IV antibiotics which included vancomycin. He is discharged home with her and Unna's boots and oral doxycycline. During this admission there was no obvious DVT involving the left lower extremity. her past medical history significant for diabetes mellitus, hypertension, hyperlipidemia, varicose veins, status post right rotator cuff repair, knee surgery on the left,robotic abdominal hysterectomy, laparoscopic cholecystectomy. She is not a smoker. Venous study done on 07/18/2015 showed normal left extremity deep venous system with no evidence of DVT. There was extensive valvular incompetence and reflux throughout the left greater saphenous vein with direct the medication to subcutaneous varicose veins. Normal left small saphenous vein. I understand she was seen by vascular radiology group and the workup and recommendation was for endovenous ablation but due to family pressure she was not able to keep her appointment a year ago. She has not been wearing her compression stockings regularly. Electronic Signature(s) Signed: 07/28/2016 4:24:16 PM By: Christin Fudge MD, FACS Entered By: Christin Fudge on 07/28/2016 11:21:12 JALITZA, RAIMONDI ([id number]) -------------------------------------------------------------------------------- Physical Exam Details Patient Name: LISSY, WERNING 07/28/2016 10:45 Date of Service: AM Medical Record [id number] Number: Patient Account Number: [id number] 07-Sep-1931 (82 y.o. Treating RN: Ahmed Prima Date of Birth/Sex: Female) Other Clinician: Primary Care Physician: Lujean Amel Treating Jontavia Leatherbury Referring Physician: Lujean Amel Physician/Extender: Weeks in Treatment: 2 Constitutional . Pulse  regular. Respirations normal and unlabored. Afebrile. . Eyes Nonicteric. Reactive to light. Ears, Nose, Mouth, and Throat Lips, teeth, and gums WNL.Marland Kitchen Moist mucosa without lesions. Neck supple and nontender. No palpable supraclavicular or cervical adenopathy. Normal sized without goiter. Respiratory WNL. No retractions.. Breath sounds WNL, No rubs, rales, rhonchi, or wheeze.. Cardiovascular Heart rhythm and rate regular, no murmur or gallop.. Pedal Pulses WNL. No clubbing, cyanosis or edema. Chest Breasts symmetical and no nipple discharge.Marland Kitchen  Breast tissue WNL, no masses, lumps, or tenderness.. Lymphatic No adneopathy. No adenopathy. No adenopathy. Musculoskeletal Adexa without tenderness or enlargement.. Digits and nails w/o clubbing, cyanosis, infection, petechiae, ischemia, or inflammatory conditions.. Integumentary (Hair, Skin) No suspicious lesions. No crepitus or fluctuance. No peri-wound warmth or erythema. No masses.Marland Kitchen Psychiatric Judgement and insight Intact.. No evidence of depression, anxiety, or agitation.. Notes The lymphedema is completely better and the ulceration is completely healed and there is no evidence of any open wound. She continues to have a lot of dry scaly skin. Electronic Signature(s) Signed: 07/28/2016 4:24:16 PM By: Christin Fudge MD, FACS Entered By: Christin Fudge on 07/28/2016 11:21:49 Jenna [name] ([id number]) -------------------------------------------------------------------------------- Physician Orders Details Patient Name: Jenna [name], Jenna [name] 07/28/2016 10:45 Date of Service: AM Medical Record [id number] Number: Patient Account Number: [id number] 05/08/1931 (82 y.o. Treating RN: Ahmed Prima Date of Birth/Sex: Female) Other Clinician: Primary Care Physician: Lujean Amel Treating Merary Garguilo Referring Physician: Lujean Amel Physician/Extender: Suella Grove in Treatment: 2 Verbal / Phone Orders: Yes Clinician: Carolyne Fiscal, Debi Read  Back and Verified: Yes Diagnosis Coding Discharge From [facility] o Discharge from [place] area clean, dry and moisturized. Please wear your compression hose everyday, take off at night. Call the office if you have any questions or concerns. Electronic Signature(s) Signed: 07/28/2016 4:24:16 PM By: Christin Fudge MD, FACS Signed: 07/28/2016 5:30:24 PM By: Alric Quan Entered By: Alric Quan on 07/28/2016 11:12:29 Jenna [name] ([id number]) -------------------------------------------------------------------------------- Problem List Details Patient Name: Jenna [name], Jenna [name] 07/28/2016 10:45 Date of Service: AM Medical Record [id number] Number: Patient Account Number: [id number] 17-Aug-1931 (82 y.o. 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 07/14/2016 Yes exposed [id number] Varicose veins of left lower extremity with both ulcer of 07/14/2016 Yes ankle and inflammation E66.01 Morbid (severe) obesity due to excess calories 07/14/2016 Yes Inactive Problems Resolved Problems Electronic Signature(s) Signed: 07/28/2016 4:24:16 PM By: Christin Fudge MD, FACS Entered By: Christin Fudge on 07/28/2016 11:20:43 Jenna [name] ([id number]) -------------------------------------------------------------------------------- Progress Note Details Patient Name: Jenna [name] 07/28/2016 10:45 Date of Service: AM Medical Record [id number] Number: Patient Account Number: [id number] 1931/01/19 (82 y.o. Treating RN: Ahmed Prima Date of Birth/Sex: Female) Other Clinician: Primary Care  Physician: Lujean Amel Treating Jackalynn Art Referring Physician: Lujean Amel Physician/Extender: Weeks in Treatment: 2 Subjective Chief Complaint Information obtained from Patient Patient presents for treatment of an open ulcer due to venous insufficiency the left lower extremity she's had for several months now History of Present Illness (HPI) The following HPI elements were documented for the patient's wound: Location: left lower extremity in the medial ankle region Quality: Patient reports experiencing a dull pain to affected area(s). Severity: Patient states wound are getting worse. Duration: Patient has had the wound for > 3 months prior to seeking treatment at the wound center Timing: Pain in wound is Intermittent (comes and goes Context: The wound appeared gradually over time Modifying Factors: Other treatment(s) tried include:[name] Associated Signs and Symptoms: Patient reports having increase swelling. 82 year old patient with a past medical history significant for venous stasis ulceration and diabetes mellitus was recently admitted to the hospital between 07/07/2016 and 07/09/2016. She was wound to have a

## 2016-08-01 DIAGNOSIS — R55 Syncope and collapse: Secondary | ICD-10-CM | POA: Diagnosis not present

## 2016-08-01 DIAGNOSIS — R2689 Other abnormalities of gait and mobility: Secondary | ICD-10-CM | POA: Diagnosis not present

## 2016-08-01 DIAGNOSIS — D649 Anemia, unspecified: Secondary | ICD-10-CM | POA: Diagnosis not present

## 2016-08-01 DIAGNOSIS — L03116 Cellulitis of left lower limb: Secondary | ICD-10-CM | POA: Diagnosis not present

## 2016-08-13 ENCOUNTER — Other Ambulatory Visit (HOSPITAL_COMMUNITY): Payer: Self-pay | Admitting: Interventional Radiology

## 2016-08-13 ENCOUNTER — Ambulatory Visit
Admission: RE | Admit: 2016-08-13 | Discharge: 2016-08-13 | Disposition: A | Discharge status: Home / Self Care | Payer: Medicare Other | Source: Ambulatory Visit | Attending: Family Medicine | Admitting: Family Medicine

## 2016-08-13 DIAGNOSIS — L97929 Non-pressure chronic ulcer of unspecified part of left lower leg with unspecified severity: Secondary | ICD-10-CM

## 2016-08-13 NOTE — Consult Note (Signed)
Chief Complaint: Patient was seen in consultation today for symptomatic left lower extremity varicose veins at the request of [place]  Referring Physician(s): Koirala,Dibas  Supervising Physician: Arne Cleveland  Patient Status: Outpatient  History of Present Illness: Jenna [name] is a 82 y.o. female known to our service from previous evaluation last summer. Because of various family issues, she was unable to proceed with treatment. To review, she had been treated for medial left calf ulceration and cellulitis, which was slow to heal. She has a remote history of some sort of vein resection in her medial calf. No interval history of DVT, superficial thrombophlebitis, or pulmonary embolism. No family history of varicose/spider veins. Since her previous visit, she has used her prescribed thigh-high compression hose, which provide some degree of incomplete relief. She is retired. She is not using any pain medications. She does note bilateral lower extremity edema. She had left knee replacement surgery approximately 8.5 years ago. She describes left lower extremity tiredness and notes color changes around the ankle. She has no pain, cellulitis, or ulceration on the right.  Past Medical History:  Diagnosis Date  . Arthritis   . Cancer [facility])    endometrial cancer  . Cellulitis of left lower extremity   . Diabetes mellitus   . Hyperlipemia   . Hypertension   . Non-healing ulcer of lower leg (HCC)    Left leg    . OAB (overactive bladder)     Past Surgical History:  Procedure Laterality Date  . ABDOMINAL HYSTERECTOMY  02/2008   robotic hyst BSO Bilateral LND  . EYE SURGERY     cataract  . JOINT REPLACEMENT     knee  . KNEE SURGERY     left knee  . LAPAROSCOPIC CHOLECYSTECTOMY SINGLE PORT N/A 07/04/2014   Procedure: LAPAROSCOPIC CHOLECYSTECTOMY SINGLE PORT;  Surgeon: Adin Hector, MD;  Location: [facility];  Service: General;  Laterality: N/A;  . ROTATOR CUFF REPAIR     right arm  . TUBAL LIGATION      Allergies: Shrimp [shellfish allergy] and Ancef [cefazolin]  Medications: Prior to Admission medications   Medication Sig Start Date End Date Taking? Authorizing Provider  amLODipine (NORVASC) 10 MG tablet Take 10 mg by mouth daily.     Yes Historical Provider, MD  aspirin 325 MG tablet Take 325 mg by mouth daily.   Yes Historical Provider, MD  atorvastatin (LIPITOR) 20 MG tablet Take 20 mg by mouth daily.     Yes Historical Provider, MD  brimonidine-timolol (COMBIGAN) 0.2-0.5 % ophthalmic solution Place 1 drop into both eyes every 12 (twelve) hours.   Yes Historical Provider, MD  glipiZIDE (GLUCOTROL XL) 2.5 MG 24 hr tablet Take 2.5 mg by mouth daily.     Yes Historical Provider, MD  hydrochlorothiazide (HYDRODIURIL) 25 MG tablet Take 25 mg by mouth daily.  05/16/14  Yes Historical Provider, MD  latanoprost (XALATAN) 0.005 % ophthalmic solution Place 1 drop into both eyes at bedtime.    Yes Historical Provider, MD  losartan (COZAAR) 100 MG tablet Take 100 mg by mouth daily.  05/16/14  Yes Historical Provider, MD  metFORMIN (GLUCOPHAGE) 1000 MG tablet Take 1,000 mg by mouth 2 (two) times daily with a meal.   Yes Historical Provider, MD  naproxen (NAPROSYN) 375 MG tablet Take 1 tablet (375 mg total) by mouth 2 (two) times daily. 06/29/16  Yes Blanchie Dessert, MD  tolterodine (DETROL LA) 4 MG 24 hr capsule Take 4 mg by  mouth daily. 05/12/16  Yes Historical Provider, MD     Family History  Problem Relation Age of Onset  . Cancer Brother 63    rectal  . Early death Brother     Social History   Social History  . Marital status: Married    Spouse name: N/A  . Number of children: N/A  . Years of education: N/A   Social History Main Topics  . Smoking status: Never Smoker  . Smokeless tobacco: Never Used  . Alcohol use No  . Drug use: No  . Sexual activity: No   Other Topics Concern  . None   Social History Narrative  . None    ECOG Status: 1 -  Symptomatic but completely ambulatory    Review of Systems Review of Systems: A 12 point ROS discussed and pertinent positives are indicated in the HPI above.  All other systems are negative.   Physical Exam Vital Signs: BP (!) 164/95 (BP Location: Right Arm, Patient Position: Sitting, Cuff Size: Normal)   Pulse 81   Temp 97.6 F (36.4 C)   Resp 16   Ht 5' (1.524 m)   SpO2 100%  Mallampati Score:     Imaging:  CLINICAL DATA:  Recurrent left lower leg cellulitis. History left calf recurrent ulceration.  EXAM: LEFT LOWER EXTREMITY VENOUS DOPPLER ULTRASOUND  TECHNIQUE: Gray-scale sonography with graded compression, as well as color Doppler and duplex ultrasound, were performed to evaluate the deep and superficial veins of left lower extremity. Spectral Doppler was utilized to evaluate flow at rest and with distal augmentation maneuvers. A complete superficial venous insufficiency exam was performed in the upright standing position. I personally performed the technical portion of the exam.  COMPARISON:  None.  FINDINGS: Deep Venous System:  Evaluation of the deep venous system including the common femoral, femoral, profunda femoral, popliteal and calf veins (where visible) demonstrate no evidence of deep venous thrombosis. The vessels are compressible and demonstrate normal respiratory phasicity and response to augmentation. No evidence of the deep venous reflux.  Superficial Venous System:  SFJ: Patent, nondilated GSV Prox Thigh:  Patent, nondilated, no reflux. GSV Mid Thigh: 7 mm diameter, greater than 1 second reflux post augmentation.  GSV Lower Thigh:  6 mm diameter, greater than 2 seconds reflux. GSV at Knee: 8 mm, greater than 1 second reflux. Direct communication to varicose vein. GSV Prox Calf:  7 mm, greater than 3 seconds reflux. GSV Distal Calf:  8 mm, greater than 1 second reflux. GSV ankle:  6 mm, 2 second reflux  SPJ: Not  visualized SSV Prox: Less than 2 mm. No reflux. SSV Mid: Less than 2 mm. No reflux. Marked calf subcutaneous edema. SSV Distal: Less than 2 mm. No reflux. Marked calf subcutaneous edema.  IMPRESSION: 1. Normal left lower extremity deep venous system. No DVT. 2. Extensive valvular incompetence and reflux through the left greater saphenous vein with direct communication to subcutaneous varicose veins. 3. Normal left small saphenous vein.   Electronically Signed   By: Lucrezia Europe M.D.   On: 08/13/2016 13:17 Labs:  CBC:  Recent Labs  07/07/16 1747 07/09/16 0516  WBC 6.4 4.9  HGB 12.0 11.3*  HCT 37.1 34.7*  PLT 298 290    COAGS: No results for input(s): INR, APTT in the last 8760 hours.  BMP:  Recent Labs  07/07/16 1747 07/08/16 0523 07/09/16 0516  NA 139 142 138  K 3.9 3.9 3.8  CL 106 111 105  CO2 26  26 27  GLUCOSE 85 108* 118*  BUN 12 9 10   CALCIUM 8.8* 8.5* 8.7*  CREATININE 0.62 0.61 0.63  GFRNONAA >60 >60 >60  GFRAA >60 >60 >60    LIVER FUNCTION TESTS:  Recent Labs  07/07/16 1747 07/09/16 0516  BILITOT 0.6 0.5  AST 19 18  ALT 17 16  ALKPHOS 107 98  PROT 7.2 6.6  ALBUMIN 3.9 3.7    TUMOR MARKERS: No results for input(s): AFPTM, CEA, CA199, CHROMGRNA in the last 8760 hours.  Assessment and Plan: My impression is that this patient does still have clinically significant left greater saphenous vein valvular incompetence and reflux with direct communication to varicose veins especially in the distal thigh and medial calf. I suspect this contributes to her poor wound healing in this region and may also contribute a component to her lower leg swelling, which may be multifactorial. In any case, I think she remains an appropriate candidate for intravenous RF closure of the refluxing greater saphenous vein. The  vein's caliber and course would be amenable to this.  We reviewed again the pathophysiology of superficial venous valvular incompetence,  reflux, and varicose veins. We discussed the natural history. We discussed treatment options including watchful waiting, compression hose, endovascular ablation, and surgical vein stripping. We reviewed again the technique of endovascular ablation, anticipated benefits, time course of symptom resolution, possible risks and side effects, and the need to wear compression hose during the treatment.. She seemed to understand and was motivated to proceed. I gave her some literature to review, as well as a prescription for 20-30 mmHg thigh-high compression hose. We can set up treatment of this left refluxing greater saphenous vein segment at her convenience, pending carrier approval.  Thank you for this interesting consult.  I greatly enjoyed meeting LAFRANCE SCHWICKERATH and look forward to participating in their care.  A copy of this report was sent to the requesting provider on this date.  Electronically Signed: Essense Bousquet III, DAYNE Adleigh Mcmasters 08/13/2016, 1:17 PM   I spent a total of    40 Minutes in face to face in clinical consultation, greater than 50% of which was counseling/coordinating care forSymptomatic varicose veins, left lower extremity.

## 2016-08-28 ENCOUNTER — Other Ambulatory Visit (HOSPITAL_COMMUNITY): Payer: Self-pay | Admitting: Interventional Radiology

## 2016-08-28 DIAGNOSIS — L97929 Non-pressure chronic ulcer of unspecified part of left lower leg with unspecified severity: Secondary | ICD-10-CM

## 2016-09-04 DIAGNOSIS — D649 Anemia, unspecified: Secondary | ICD-10-CM | POA: Diagnosis not present

## 2016-09-15 DIAGNOSIS — H401132 Primary open-angle glaucoma, bilateral, moderate stage: Secondary | ICD-10-CM | POA: Diagnosis not present

## 2016-09-15 DIAGNOSIS — H04123 Dry eye syndrome of bilateral lacrimal glands: Secondary | ICD-10-CM | POA: Diagnosis not present

## 2016-09-24 ENCOUNTER — Other Ambulatory Visit: Payer: Medicare Other

## 2016-09-24 ENCOUNTER — Ambulatory Visit: Payer: Medicare Other

## 2016-09-29 DIAGNOSIS — Z23 Encounter for immunization: Secondary | ICD-10-CM | POA: Diagnosis not present

## 2016-09-30 ENCOUNTER — Encounter: Payer: Self-pay | Admitting: Interventional Radiology

## 2016-09-30 ENCOUNTER — Ambulatory Visit
Admission: RE | Admit: 2016-09-30 | Discharge: 2016-09-30 | Disposition: A | Discharge status: Home / Self Care | Payer: Medicare Other | Source: Ambulatory Visit | Attending: Interventional Radiology | Admitting: Interventional Radiology

## 2016-09-30 DIAGNOSIS — L97929 Non-pressure chronic ulcer of unspecified part of left lower leg with unspecified severity: Secondary | ICD-10-CM

## 2016-09-30 DIAGNOSIS — I83029 Varicose veins of left lower extremity with ulcer of unspecified site: Secondary | ICD-10-CM

## 2016-09-30 DIAGNOSIS — I83812 Varicose veins of left lower extremities with pain: Secondary | ICD-10-CM | POA: Diagnosis not present

## 2016-09-30 HISTORY — PX: IR GENERIC HISTORICAL: IMG1180011

## 2016-09-30 MED ORDER — DIAZEPAM 5 MG PO TABS: 5.0000 mg | ORAL_TABLET | Freq: Once | ORAL | 0 refills | Status: AC

## 2016-10-07 ENCOUNTER — Other Ambulatory Visit (HOSPITAL_COMMUNITY): Payer: Self-pay | Admitting: Interventional Radiology

## 2016-10-07 ENCOUNTER — Ambulatory Visit
Admission: RE | Admit: 2016-10-07 | Discharge: 2016-10-07 | Disposition: A | Discharge status: Home / Self Care | Payer: Medicare Other | Source: Ambulatory Visit | Attending: Interventional Radiology | Admitting: Interventional Radiology

## 2016-10-07 DIAGNOSIS — I8392 Asymptomatic varicose veins of left lower extremity: Secondary | ICD-10-CM | POA: Diagnosis not present

## 2016-10-07 DIAGNOSIS — L97929 Non-pressure chronic ulcer of unspecified part of left lower leg with unspecified severity: Secondary | ICD-10-CM

## 2016-10-07 DIAGNOSIS — R600 Localized edema: Secondary | ICD-10-CM | POA: Diagnosis not present

## 2016-10-07 NOTE — Progress Notes (Signed)
Jenna [name] returns 1 week after laser occlusion of the greater saphenous vein. She denies fevers or chills. She complains of a minimal rash about the laser entry site in the medial calf. She has been wearing thigh-high graded compression stockings that she obtained recently. She does not know the compression rating but they are probably not quite at the level of 20-30 mmHg. She denies any pain or other problems from the procedure.  Examination of the laser entry site along the medial upper left calf demonstrates a small papular rash. No evidence of pus or erythema. Edema at the ankle persists. No skin breakdown.  Ultrasound was performed today. There is no evidence to the of DVT and there is successful occlusion of the greater saphenous vein. Varicosities are diminutive but remain compressible and patent.  Jenna [name] has done well after laser occlusion of the greater saphenous vein. The vein is occluded. The rash at the entry site may represent an early infection therefore I will prescribe Keflex, a one-week course. She will return at the one-month interval.  Over the course of 20 minutes, her questions were answered.

## 2016-10-09 DIAGNOSIS — R609 Edema, unspecified: Secondary | ICD-10-CM | POA: Diagnosis not present

## 2016-10-09 DIAGNOSIS — I872 Venous insufficiency (chronic) (peripheral): Secondary | ICD-10-CM | POA: Diagnosis not present

## 2016-10-09 DIAGNOSIS — L03116 Cellulitis of left lower limb: Secondary | ICD-10-CM | POA: Diagnosis not present

## 2016-10-17 DIAGNOSIS — E119 Type 2 diabetes mellitus without complications: Secondary | ICD-10-CM | POA: Diagnosis not present

## 2016-10-17 DIAGNOSIS — I10 Essential (primary) hypertension: Secondary | ICD-10-CM | POA: Diagnosis not present

## 2016-10-17 DIAGNOSIS — Z7984 Long term (current) use of oral hypoglycemic drugs: Secondary | ICD-10-CM | POA: Diagnosis not present

## 2016-10-17 DIAGNOSIS — R600 Localized edema: Secondary | ICD-10-CM | POA: Diagnosis not present

## 2016-11-13 DIAGNOSIS — R1030 Lower abdominal pain, unspecified: Secondary | ICD-10-CM | POA: Diagnosis not present

## 2016-11-13 DIAGNOSIS — N3000 Acute cystitis without hematuria: Secondary | ICD-10-CM | POA: Diagnosis not present

## 2016-11-19 ENCOUNTER — Other Ambulatory Visit: Payer: Medicare Other

## 2016-11-19 ENCOUNTER — Ambulatory Visit: Payer: Medicare Other

## 2016-12-03 ENCOUNTER — Other Ambulatory Visit (HOSPITAL_COMMUNITY): Payer: Self-pay | Admitting: Interventional Radiology

## 2016-12-03 ENCOUNTER — Ambulatory Visit
Admission: RE | Admit: 2016-12-03 | Discharge: 2016-12-03 | Disposition: A | Discharge status: Home / Self Care | Payer: Medicare Other | Source: Ambulatory Visit | Attending: Interventional Radiology | Admitting: Interventional Radiology

## 2016-12-03 DIAGNOSIS — L97929 Non-pressure chronic ulcer of unspecified part of left lower leg with unspecified severity: Secondary | ICD-10-CM

## 2016-12-03 DIAGNOSIS — I82812 Embolism and thrombosis of superficial veins of left lower extremities: Secondary | ICD-10-CM | POA: Diagnosis not present

## 2016-12-08 DIAGNOSIS — Z7984 Long term (current) use of oral hypoglycemic drugs: Secondary | ICD-10-CM | POA: Diagnosis not present

## 2016-12-08 DIAGNOSIS — D649 Anemia, unspecified: Secondary | ICD-10-CM | POA: Diagnosis not present

## 2016-12-08 DIAGNOSIS — R600 Localized edema: Secondary | ICD-10-CM | POA: Diagnosis not present

## 2016-12-08 DIAGNOSIS — E119 Type 2 diabetes mellitus without complications: Secondary | ICD-10-CM | POA: Diagnosis not present

## 2016-12-08 DIAGNOSIS — I10 Essential (primary) hypertension: Secondary | ICD-10-CM | POA: Diagnosis not present

## 2016-12-08 DIAGNOSIS — E1165 Type 2 diabetes mellitus with hyperglycemia: Secondary | ICD-10-CM | POA: Diagnosis not present

## 2016-12-18 DIAGNOSIS — I38 Endocarditis, valve unspecified: Secondary | ICD-10-CM | POA: Diagnosis not present

## 2016-12-18 DIAGNOSIS — Z6841 Body Mass Index (BMI) 40.0 and over, adult: Secondary | ICD-10-CM | POA: Diagnosis not present

## 2016-12-18 DIAGNOSIS — C541 Malignant neoplasm of endometrium: Secondary | ICD-10-CM | POA: Diagnosis not present

## 2016-12-18 DIAGNOSIS — Z1231 Encounter for screening mammogram for malignant neoplasm of breast: Secondary | ICD-10-CM | POA: Diagnosis not present

## 2016-12-18 DIAGNOSIS — Z01419 Encounter for gynecological examination (general) (routine) without abnormal findings: Secondary | ICD-10-CM | POA: Diagnosis not present

## 2016-12-23 NOTE — Progress Notes (Deleted)
Cardiology Office Note   Date:  12/23/2016   ID:  Jenna [name], DOB [birth year], MRN [id number]  PCP:  Jenna Amel, MD  Cardiologist:   Jenkins Rouge, MD   No chief complaint on file.     History of Present Illness: Jenna [name] is a 82 y.o. female who presents for evaluation of murmur CRF;s of HTN and elevated lipids and NIDDM.  She is  Post L TKR and has extensive valvular incompetence of left greater saphenous veins with direct communication to surface varicosities Has been seen by Dr Vernard Gambles IR but no Rx yet   Reviewed records from Dr Katharine Look Rivard  12/18/16 but no useful information regarding referral Problem List includes elevated lipids , morbid obesity and HTN   Past Medical History:  Diagnosis Date  . Arthritis   . Cancer [facility])    endometrial cancer  . Cellulitis of left lower extremity   . Diabetes mellitus   . Hyperlipemia   . Hypertension   . Non-healing ulcer of lower leg (HCC)    Left leg    . OAB (overactive bladder)     Past Surgical History:  Procedure Laterality Date  . ABDOMINAL HYSTERECTOMY  02/2008   robotic hyst BSO Bilateral LND  . EYE SURGERY     cataract  . IR GENERIC HISTORICAL  09/30/2016   IR EMBO VENOUS NOT HEMORR HEMANG  INC [facility]IDE ROADMAPPING [facility] ULTRASOUND  . JOINT REPLACEMENT     knee  . KNEE SURGERY     left knee  . LAPAROSCOPIC CHOLECYSTECTOMY SINGLE PORT N/A 07/04/2014   Procedure: LAPAROSCOPIC CHOLECYSTECTOMY SINGLE PORT;  Surgeon: Adin Hector, MD;  Location: [facility];  Service: General;  Laterality: N/A;  . ROTATOR CUFF REPAIR     right arm  . TUBAL LIGATION       Current Outpatient Prescriptions  Medication Sig Dispense Refill  . amLODipine (NORVASC) 10 MG tablet Take 10 mg by mouth daily.      Marland Kitchen aspirin 325 MG tablet Take 325 mg by mouth daily.    Marland Kitchen atorvastatin (LIPITOR) 20 MG tablet Take 20 mg by mouth daily.      . brimonidine-timolol (COMBIGAN) 0.2-0.5 % ophthalmic solution Place 1 drop into both eyes  every 12 (twelve) hours.    Marland Kitchen glipiZIDE (GLUCOTROL XL) 2.5 MG 24 hr tablet Take 2.5 mg by mouth daily.      . hydrochlorothiazide (HYDRODIURIL) 25 MG tablet Take 25 mg by mouth daily.     Marland Kitchen latanoprost (XALATAN) 0.005 % ophthalmic solution Place 1 drop into both eyes at bedtime.     Marland Kitchen losartan (COZAAR) 100 MG tablet Take 100 mg by mouth daily.     . metFORMIN (GLUCOPHAGE) 1000 MG tablet Take 1,000 mg by mouth 2 (two) times daily with a meal.    . naproxen (NAPROSYN) 375 MG tablet Take 1 tablet (375 mg total) by mouth 2 (two) times daily. 14 tablet 0  . tolterodine (DETROL LA) 4 MG 24 hr capsule Take 4 mg by mouth daily.  4   No current facility-administered medications for this visit.     Allergies:   Shrimp [shellfish allergy] and Ancef [cefazolin]    Social History:  The patient  reports that she has never smoked. She has never used smokeless tobacco. She reports that she does not drink alcohol or use drugs.   Family History:  The patient's family history includes Cancer (age of onset: 78) in her brother; Early death  in her brother.    ROS:  Please see the history of present illness.   Otherwise, review of systems are positive for {NONE DEFAULTED:18576::"none"}.   All other systems are reviewed and negative.    PHYSICAL EXAM: VS:  There were no vitals taken for this visit. , BMI There is no height or weight on file to calculate BMI. Affect appropriate Healthy:  appears stated age 84: normal Neck supple with no adenopathy JVP normal no bruits no thyromegaly Lungs clear with no wheezing and good diaphragmatic motion Heart:  S1/S2 SEM  murmur, no rub, gallop or click PMI normal Abdomen: benighn, BS positve, no tenderness, no AAA no bruit.  No HSM or HJR Distal pulses intact with no bruits No edema Neuro non-focal Skin warm and dry No muscular weakness    EKG:  ***   Recent Labs: 07/09/2016: ALT 16; BUN 10; Creatinine, Ser 0.63; Hemoglobin 11.3; Platelets 290; Potassium  3.8; Sodium 138    Lipid Panel No results found for: CHOL, TRIG, HDL, CHOLHDL, VLDL, LDLCALC, LDLDIRECT    Wt Readings from Last 3 Encounters:  07/07/16 227 lb (103 kg)  07/25/14 230 lb (104.3 kg)  07/03/14 233 lb 12.8 oz (106.1 kg)      Other studies Reviewed: Additional studies/ records that were reviewed today include: ***.    ASSESSMENT AND PLAN:  1.  ***   Current medicines are reviewed at length with the patient today.  The patient {ACTIONS; HAS/DOES NOT HAVE:19233} concerns regarding medicines.  The following changes have been made:  {PLAN; NO CHANGE:13088:s}  Labs/ tests ordered today include: *** No orders of the defined types were placed in this encounter.    Disposition:   FU with ***     Signed, Jenkins Rouge, MD  12/23/2016 12:44 PM    [facility] [address], [place], [place]  [address] Phone: [phone]; Fax: [phone]

## 2016-12-26 DIAGNOSIS — I10 Essential (primary) hypertension: Secondary | ICD-10-CM | POA: Diagnosis not present

## 2016-12-26 DIAGNOSIS — Z79899 Other long term (current) drug therapy: Secondary | ICD-10-CM | POA: Diagnosis not present

## 2016-12-31 ENCOUNTER — Encounter: Payer: Self-pay | Admitting: Cardiology

## 2016-12-31 ENCOUNTER — Ambulatory Visit (INDEPENDENT_AMBULATORY_CARE_PROVIDER_SITE_OTHER): Payer: Medicare Other | Admitting: Cardiology

## 2016-12-31 ENCOUNTER — Encounter: Payer: Self-pay | Admitting: *Deleted

## 2016-12-31 ENCOUNTER — Ambulatory Visit: Payer: Medicare Other | Admitting: Cardiovascular Disease

## 2016-12-31 VITALS — BP 177/75 | HR 83 | Ht 60.0 in | Wt 231.2 lb

## 2016-12-31 DIAGNOSIS — R0683 Snoring: Secondary | ICD-10-CM

## 2016-12-31 DIAGNOSIS — R011 Cardiac murmur, unspecified: Secondary | ICD-10-CM

## 2016-12-31 DIAGNOSIS — I10 Essential (primary) hypertension: Secondary | ICD-10-CM | POA: Diagnosis not present

## 2016-12-31 DIAGNOSIS — L03116 Cellulitis of left lower limb: Secondary | ICD-10-CM | POA: Diagnosis not present

## 2016-12-31 DIAGNOSIS — E119 Type 2 diabetes mellitus without complications: Secondary | ICD-10-CM

## 2016-12-31 NOTE — Assessment & Plan Note (Signed)
Type 2 NIDDM 

## 2016-12-31 NOTE — Patient Instructions (Addendum)
  SCHEDULE AT [facility] has requested that you have an echocardiogram. Echocardiography is a painless test that uses sound waves to create images of your heart. It provides your doctor with information about the size and shape of your heart and how well your heart's chambers and valves are working. This procedure takes approximately one hour. There are no restrictions for this procedure.  SCHEDULE AT [facility] Your physician has recommended that you have a sleep study. This test records several body functions during sleep, including: brain activity, eye movement, oxygen and carbon dioxide blood levels, heart rate and rhythm, breathing rate and rhythm, the flow of air through your mouth and nose, snoring, body muscle movements, and chest and belly movement.  IF NORMAL , FOLLOW UP AS NEEDED, IF EITHER TEST IS ABNORMAL WILL FOLLOW UP WITH APPROPRIATE DOCTOR.   Marland Kitchen

## 2016-12-31 NOTE — Assessment & Plan Note (Signed)
Successfully treated with greater saphenous vein ablation

## 2016-12-31 NOTE — Assessment & Plan Note (Signed)
BMI 45 with history suggestive of sleep apnea.

## 2016-12-31 NOTE — Progress Notes (Signed)
12/31/2016 VALLORIE CALABRO   12-25-1931  [id number]  Primary Physician Lujean Amel, MD Primary Cardiologist: Dr Gwenlyn Found (new)  HPI:  Pleasant 83 y/o (looks younger!) obese AA female with a PMH of HTN, NIDDM, and obesity. She is referred to [place] today by for evaluation of a new murmur. The pt denies any unusual dyspnea or chest pain. She has had prior stress tests but never an echo.    Current Outpatient Prescriptions  Medication Sig Dispense Refill  . amLODipine (NORVASC) 5 MG tablet Take 5 mg by mouth daily.  2  . aspirin 325 MG tablet Take 325 mg by mouth daily.    Marland Kitchen atorvastatin (LIPITOR) 20 MG tablet Take 20 mg by mouth daily.      . brimonidine-timolol (COMBIGAN) 0.2-0.5 % ophthalmic solution Place 1 drop into both eyes every 12 (twelve) hours.    Marland Kitchen glipiZIDE (GLUCOTROL XL) 2.5 MG 24 hr tablet Take 2.5 mg by mouth daily.      . hydrochlorothiazide (HYDRODIURIL) 25 MG tablet Take 25 mg by mouth daily.     Marland Kitchen latanoprost (XALATAN) 0.005 % ophthalmic solution Place 1 drop into both eyes at bedtime.     Marland Kitchen losartan (COZAAR) 100 MG tablet Take 100 mg by mouth daily.     . metFORMIN (GLUCOPHAGE) 1000 MG tablet Take 1,000 mg by mouth 2 (two) times daily with a meal.    . naproxen (NAPROSYN) 375 MG tablet Take 1 tablet (375 mg total) by mouth 2 (two) times daily. 14 tablet 0  . tolterodine (DETROL LA) 4 MG 24 hr capsule Take 4 mg by mouth daily.  4   No current facility-administered medications for this visit.     Allergies  Allergen Reactions  . Shrimp [Shellfish Allergy] Anaphylaxis  . Ancef [Cefazolin] Nausea And Vomiting    Social History   Social History  . Marital status: Married    Spouse name: N/A  . Number of children: N/A  . Years of education: N/A   Occupational History  . Not on file.   Social History Main Topics  . Smoking status: Never Smoker  . Smokeless tobacco: Never Used  . Alcohol use No  . Drug use: No  . Sexual activity: No   Other Topics Concern    . Not on file   Social History Narrative  . No narrative on file     Review of Systems: General: negative for chills, fever, night sweats or weight changes.  Cardiovascular: negative for chest pain, dyspnea on exertion, edema, orthopnea, palpitations, paroxysmal nocturnal dyspnea or shortness of breath Dermatological: negative for rash Respiratory: negative for cough or wheezing Urologic: negative for hematuria Abdominal: negative for nausea, vomiting, diarrhea, bright red blood per rectum, melena, or hematemesis Neurologic: negative for visual changes, syncope, or dizziness All other systems reviewed and are otherwise negative except as noted above.    Blood pressure (!) 177/75, pulse 83, height 5' (1.524 m), weight 231 lb 3.2 oz (104.9 kg).  General appearance: alert, cooperative, no distress and morbidly obese Neck: no carotid bruit and no JVD Lungs: clear to auscultation bilaterally Heart: regular rate and rhythm and 1/6 systolic ejection murmur LSB Extremities: extremities normal, atraumatic, no cyanosis or edema Pulses: 2+ and symmetric Skin: Skin color, texture, turgor normal. No rashes or lesions Neurologic: Grossly normal  EKG NSR, LVH by volts, NSST changes  ASSESSMENT AND PLAN:   Murmur, cardiac Pt was referred to [place] for evaluation of a murmur which reportedly was new.  HTN (hypertension) Followed by PCP  Non-insulin dependent type 2 diabetes mellitus ([place]) Type 2 NIDDM  Cellulitis of left leg Successfully treated with greater saphenous vein ablation  Morbid obesity (HCC) BMI 45 with history suggestive of sleep apnea.   PLAN Pt seen by Dr Gwenlyn Found and myself- plan is for an echo and sleep study. F/U with Dr Gwenlyn Found if echo is abnormal,  F/U with Dr Claiborne Billings if sleep study is abnormal.   Kerin Ransom PA-C 12/31/2016 12:30 PM

## 2016-12-31 NOTE — Assessment & Plan Note (Signed)
Followed by PCP

## 2016-12-31 NOTE — Assessment & Plan Note (Signed)
Pt was referred to [place] for evaluation of a murmur which reportedly was new.

## 2017-01-15 ENCOUNTER — Other Ambulatory Visit (HOSPITAL_COMMUNITY): Payer: Medicare Other

## 2017-01-23 ENCOUNTER — Other Ambulatory Visit: Payer: Self-pay

## 2017-01-23 ENCOUNTER — Ambulatory Visit (HOSPITAL_COMMUNITY): Payer: Medicare Other | Attending: Cardiovascular Disease

## 2017-01-23 DIAGNOSIS — I10 Essential (primary) hypertension: Secondary | ICD-10-CM | POA: Insufficient documentation

## 2017-01-23 DIAGNOSIS — R011 Cardiac murmur, unspecified: Secondary | ICD-10-CM | POA: Diagnosis not present

## 2017-01-23 DIAGNOSIS — E785 Hyperlipidemia, unspecified: Secondary | ICD-10-CM | POA: Diagnosis not present

## 2017-01-23 DIAGNOSIS — L03116 Cellulitis of left lower limb: Secondary | ICD-10-CM | POA: Diagnosis not present

## 2017-01-23 DIAGNOSIS — E119 Type 2 diabetes mellitus without complications: Secondary | ICD-10-CM | POA: Insufficient documentation

## 2017-01-23 DIAGNOSIS — I340 Nonrheumatic mitral (valve) insufficiency: Secondary | ICD-10-CM | POA: Insufficient documentation

## 2017-01-23 DIAGNOSIS — I071 Rheumatic tricuspid insufficiency: Secondary | ICD-10-CM | POA: Insufficient documentation

## 2017-01-23 DIAGNOSIS — I351 Nonrheumatic aortic (valve) insufficiency: Secondary | ICD-10-CM | POA: Diagnosis not present

## 2017-01-27 DIAGNOSIS — M79605 Pain in left leg: Secondary | ICD-10-CM | POA: Diagnosis not present

## 2017-01-28 ENCOUNTER — Telehealth: Payer: Self-pay | Admitting: Cardiology

## 2017-01-28 NOTE — Telephone Encounter (Signed)
Returning your call from yesterday. °

## 2017-01-28 NOTE — Telephone Encounter (Signed)
Pt advised on results. 

## 2017-02-05 ENCOUNTER — Ambulatory Visit (HOSPITAL_BASED_OUTPATIENT_CLINIC_OR_DEPARTMENT_OTHER): Payer: Medicare Other | Attending: Cardiology | Admitting: Cardiovascular Disease

## 2017-02-05 VITALS — Ht 60.0 in | Wt 232.0 lb

## 2017-02-05 DIAGNOSIS — L03116 Cellulitis of left lower limb: Secondary | ICD-10-CM

## 2017-02-05 DIAGNOSIS — I10 Essential (primary) hypertension: Secondary | ICD-10-CM

## 2017-02-05 DIAGNOSIS — R0683 Snoring: Secondary | ICD-10-CM | POA: Diagnosis not present

## 2017-02-05 DIAGNOSIS — E119 Type 2 diabetes mellitus without complications: Secondary | ICD-10-CM

## 2017-02-05 DIAGNOSIS — R011 Cardiac murmur, unspecified: Secondary | ICD-10-CM

## 2017-02-05 DIAGNOSIS — G4733 Obstructive sleep apnea (adult) (pediatric): Secondary | ICD-10-CM | POA: Diagnosis not present

## 2017-02-08 DIAGNOSIS — G4733 Obstructive sleep apnea (adult) (pediatric): Secondary | ICD-10-CM | POA: Insufficient documentation

## 2017-02-08 NOTE — Procedures (Signed)
Patient Name: Jenna [name], Godfrey Date: 02/05/2017 Gender: Female D.O.B: [birth year] Age (years): 59 Referring Provider: Kerin Ransom Height (inches): 60 Interpreting Physician: Shelva Majestic MD, ABSM Weight (lbs): 232 RPSGT: Joni Reining BMI: 63 MRN: [id number] Neck Size: 15.00  CLINICAL INFORMATION Sleep Study Type: NPSG  Indication for sleep study: Obesity, Snoring  Epworth Sleepiness Score: 4  SLEEP STUDY TECHNIQUE As per the AASM Manual for the Scoring of Sleep and Associated Events v2.3 (April 2016) with a hypopnea requiring 4% desaturations.  The channels recorded and monitored were frontal, central and occipital EEG, electrooculogram (EOG), submentalis EMG (chin), nasal and oral airflow, thoracic and abdominal wall motion, anterior tibialis EMG, snore microphone, electrocardiogram, and pulse oximetry.  MEDICATIONS amLODipine (NORVASC) 5 MG tablet aspirin 325 MG tablet atorvastatin (LIPITOR) 20 MG tablet brimonidine-timolol (COMBIGAN) 0.2-0.5 % ophthalmic solution glipiZIDE (GLUCOTROL XL) 2.5 MG 24 hr tablet hydrochlorothiazide (HYDRODIURIL) 25 MG tablet latanoprost (XALATAN) 0.005 % ophthalmic solution losartan (COZAAR) 100 MG tablet metFORMIN (GLUCOPHAGE) 1000 MG tablet naproxen (NAPROSYN) 375 MG tablet tolterodine (DETROL LA) 4 MG 24 hr capsule  Medications self-administered by patient taken the night of the study : N/A  SLEEP ARCHITECTURE The study was initiated at 10:27:41 PM and ended at 4:37:30 AM.  Sleep onset time was 100.0 minutes and the sleep efficiency was 57.2%. The total sleep time was 211.5 minutes. Wake after sleep onset (WASO) was 58.3 minutes.  Stage REM latency was 115.0 minutes.  The patient spent 7.09% of the night in stage N1 sleep, 79.91% in stage N2 sleep, 0.00% in stage N3 and 13.00% in REM.  Alpha intrusion was absent.  Supine sleep was 100.00%.  RESPIRATORY PARAMETERS The overall apnea/hypopnea index (AHI) was 10.2  per hour.  The Respiratory Disturbance Index (RDI) was 12.2/h. There were 15 total apneas, including 15 obstructive, 0 central and 0 mixed apneas. There were 21 hypopneas and 7 RERAs.  The AHI during Stage REM sleep was 56.7 per hour.  AHI while supine was 10.2 per hour.  The mean oxygen saturation was 94.76%. The minimum SpO2 during sleep was 83.00%.  Moderate snoring was noted during this study.  CARDIAC DATA The 2 lead EKG demonstrated sinus rhythm. The mean heart rate was 68.33 beats per minute. Other EKG findings include: PVCs and PACs.  LEG MOVEMENT DATA The total PLMS were 811 with a resulting PLMS index of 230.07. Associated arousal with leg movement index was 3.4 .  IMPRESSIONS - Mild obstructive sleep apnea overall (AHI 10.2/h; RDI 12.2/h); however, sleep apnea was severe during REM sleep (AHI 56.7/h). - No significant central sleep apnea occurred during this study (CAI = 0.0/h). - Moderate oxygen desaturation  to a nadir of 83.00%. - The patient snored with Moderate snoring volume. - Reduced sleep efficiency. - EKG findings include PVCs, PACs - Severe periodic limb movements of sleep occurred during the study. No significant associated arousals.  DIAGNOSIS - Obstructive Sleep Apnea (327.23 [G47.33 ICD-10]) - Periodic Limb Movement Disorder of Sleep  RECOMMENDATIONS - Recommend therapeutic CPAP titration to determine optimal pressure required to alleviate sleep disordered breathing. - Efforts should be made to optimize nasal and oropharyngeal patency. - Positional therapy avoiding supine position during sleep. - If patient is symptomatic with restless legs on CPAP, consider pharmocotherapy with a PLMS index of  230. - Avoid alcohol, sedatives and other CNS depressants that may worsen sleep apnea and disrupt normal sleep architecture. - Sleep hygiene should be reviewed to assess factors that may improve sleep quality. -  Weight management and regular exercise should be  initiated or continued.  [Electronically signed] 02/08/2017 05:18 PM  Shelva Majestic MD, [facility] [place], [place], American Board of Sleep Medicine   NPI: [id number]  [place] PH: [phone]   FX: [phone] [place]

## 2017-02-20 ENCOUNTER — Telehealth: Payer: Self-pay | Admitting: *Deleted

## 2017-02-20 NOTE — Telephone Encounter (Signed)
Left message to return a call to me. ( need to discuss patient's sleep study.)

## 2017-02-20 NOTE — Progress Notes (Signed)
02/20/17 LMTCB

## 2017-02-23 ENCOUNTER — Telehealth: Payer: Self-pay | Admitting: Cardiovascular Disease

## 2017-02-23 NOTE — Telephone Encounter (Signed)
Forward to [place]

## 2017-02-23 NOTE — Telephone Encounter (Signed)
New message     Please call returning your call about sleep study results

## 2017-02-24 ENCOUNTER — Telehealth: Payer: Self-pay | Admitting: Cardiovascular Disease

## 2017-02-24 NOTE — Telephone Encounter (Signed)
Follow up    Patient calling again for test results, would like to speak to [facility]

## 2017-02-25 NOTE — Telephone Encounter (Signed)
Follow Up    Pt calling back about test results. Per pt still hadn't heard anything from anyone. Requesting call back

## 2017-02-26 ENCOUNTER — Other Ambulatory Visit: Payer: Self-pay | Admitting: *Deleted

## 2017-02-26 ENCOUNTER — Telehealth: Payer: Self-pay | Admitting: *Deleted

## 2017-02-26 DIAGNOSIS — G4733 Obstructive sleep apnea (adult) (pediatric): Secondary | ICD-10-CM

## 2017-02-26 NOTE — Telephone Encounter (Signed)
Patient notified of sleep study results and recommendations. She is has agreed to having the titration study.

## 2017-02-26 NOTE — Telephone Encounter (Signed)
-----   Message from Troy Sine, MD sent at 02/08/2017  5:24 PM EST ----- Jenna [name] notify pt of results and please schedule for CPAP titration study.

## 2017-02-26 NOTE — Progress Notes (Signed)
Patient notified of sleep study results and recommendations. 

## 2017-02-26 NOTE — Telephone Encounter (Signed)
Please call her in regards to the appointment she spoke with you about earlier.

## 2017-02-26 NOTE — Telephone Encounter (Signed)
Called patient and informed her CPAP titration study has been scheduled Monday March 5th. Notice has been sent to precert.

## 2017-02-26 NOTE — Telephone Encounter (Signed)
Follow up    Pt calling for [facility] regarding test results

## 2017-03-02 ENCOUNTER — Encounter (HOSPITAL_BASED_OUTPATIENT_CLINIC_OR_DEPARTMENT_OTHER): Payer: Medicare Other

## 2017-03-05 ENCOUNTER — Ambulatory Visit (HOSPITAL_BASED_OUTPATIENT_CLINIC_OR_DEPARTMENT_OTHER): Payer: Medicare Other | Attending: Cardiovascular Disease | Admitting: Cardiovascular Disease

## 2017-03-05 VITALS — Ht 60.0 in | Wt 231.0 lb

## 2017-03-05 DIAGNOSIS — G478 Other sleep disorders: Secondary | ICD-10-CM | POA: Diagnosis not present

## 2017-03-05 DIAGNOSIS — G4733 Obstructive sleep apnea (adult) (pediatric): Secondary | ICD-10-CM | POA: Diagnosis not present

## 2017-03-05 DIAGNOSIS — R635 Abnormal weight gain: Secondary | ICD-10-CM | POA: Diagnosis not present

## 2017-03-06 ENCOUNTER — Encounter (HOSPITAL_BASED_OUTPATIENT_CLINIC_OR_DEPARTMENT_OTHER): Payer: Medicare Other

## 2017-03-15 NOTE — Procedures (Signed)
Patient Name: Jenna [name], Jenna [name] Date: 03/05/2017 Gender: Female D.O.B: 05/05/1931 Age (years): 61 Referring Provider: Shelva Majestic MD, ABSM Height (inches): 60 Interpreting Physician: Shelva Majestic MD, ABSM Weight (lbs): 232 RPSGT: Baxter Flattery BMI: 56 MRN: [id number] Neck Size: 15.00  CLINICAL INFORMATION The patient is referred for a CPAP titration to treat sleep apnea.  Date of NPSG:  02/05/2017: AHI 10.2/h, RDI 12.2/h, and AHI during REM sleep 57.6/h.; oxygen desaturation to 83%.  SLEEP STUDY TECHNIQUE As per the AASM Manual for the Scoring of Sleep and Associated Events v2.3 (April 2016) with a hypopnea requiring 4% desaturations.  The channels recorded and monitored were frontal, central and occipital EEG, electrooculogram (EOG), submentalis EMG (chin), nasal and oral airflow, thoracic and abdominal wall motion, anterior tibialis EMG, snore microphone, electrocardiogram, and pulse oximetry. Continuous positive airway pressure (CPAP) was initiated at the beginning of the study and titrated to treat sleep-disordered breathing.  MEDICATIONS amLODipine (NORVASC) 5 MG tablet aspirin 325 MG tablet atorvastatin (LIPITOR) 20 MG tablet brimonidine-timolol (COMBIGAN) 0.2-0.5 % ophthalmic solution glipiZIDE (GLUCOTROL XL) 2.5 MG 24 hr tablet hydrochlorothiazide (HYDRODIURIL) 25 MG tablet latanoprost (XALATAN) 0.005 % ophthalmic solution losartan (COZAAR) 100 MG tablet metFORMIN (GLUCOPHAGE) 1000 MG tablet naproxen (NAPROSYN) 375 MG tablet tolterodine (DETROL LA) 4 MG 24 hr capsule  Medications self-administered by patient taken the night of the study : N/A  TECHNICIAN COMMENTS Comments added by technician: Patient had difficulty initiating sleep. Patient was restless all through the night.   RESPIRATORY PARAMETERS Optimal PAP Pressure (cm): 10 AHI at Optimal Pressure (/hr): 10.9 Overall Minimal O2 (%): 88.00 Supine % at Optimal Pressure (%): 100 Minimal O2 at  Optimal Pressure (%): 90.0    SLEEP ARCHITECTURE The study was initiated at 10:47:31 PM and ended at 5:08:03 AM.  Sleep onset time was 138.2 minutes and the sleep efficiency was 3.7%. The total sleep time was 14.0 minutes.  The patient spent 14.29% of the night in stage N1 sleep, 85.71% in stage N2 sleep, 0.00% in stage N3 and 0.00% in REM.Stage REM latency was N/A minutes  Wake after sleep onset was 228.3. Alpha intrusion was absent. Supine sleep was 100.00%.  CARDIAC DATA The 2 lead EKG demonstrated sinus rhythm. The mean heart rate was 71.13 beats per minute. Other EKG findings include: None.  LEG MOVEMENT DATA The total Periodic Limb Movements of Sleep (PLMS) were 0. The PLMS index was 0.00. A PLMS index of <15 is considered normal in adults.  IMPRESSIONS - This was a suboptimal CPAP titration study due to extremely poor sleep efficiency. At maximally titrated 10 cm water pressure, AHI was 10.9 per hour. - Central sleep apnea was not noted during this titration (CAI = 0.0/h). - Mild  oxygen desaturations to a nadir of 88%. - The patient snored with Soft snoring volume during this titration study. - No cardiac abnormalities were observed during this study. - Clinically significant periodic limb movements were not noted during this study. Arousals associated with PLMs were rare.  DIAGNOSIS - Obstructive Sleep Apnea (327.23 [G47.33 ICD-10])  RECOMMENDATIONS - Recommend an initial trial of CPAP Auto therapy with EPR/A-Flex of 3 with a range from  9 - 20 cm water pressure with heated humidification.  A small size [employer]Paykel Full Face Mask Simplus mask was used for the titration. - Avoid alcohol, sedatives and other CNS depressants that may worsen sleep apnea and disrupt normal sleep architecture. - Sleep hygiene should be reviewed to assess factors that may improve sleep quality. -  Weight management and regular exercise should be initiated. - Recommend a download be obtained in 30  days and the patient scheduled for a sleep clinic evaluation after 4 weeks of therapy  [Electronically signed] 03/15/2017 10:53 AM  Shelva Majestic MD, [facility], ABSM Diplomate, American Board of Sleep Medicine  NPI: [id number] [place] PH: [phone]   FX: [phone] [place]

## 2017-03-17 DIAGNOSIS — H401132 Primary open-angle glaucoma, bilateral, moderate stage: Secondary | ICD-10-CM | POA: Diagnosis not present

## 2017-03-17 DIAGNOSIS — H2511 Age-related nuclear cataract, right eye: Secondary | ICD-10-CM | POA: Diagnosis not present

## 2017-03-17 NOTE — Telephone Encounter (Signed)
Patient wanted to be sure that I was aware that she rescheduled her CPAP titration study.

## 2017-03-26 ENCOUNTER — Other Ambulatory Visit: Payer: Self-pay | Admitting: *Deleted

## 2017-03-26 DIAGNOSIS — G4733 Obstructive sleep apnea (adult) (pediatric): Secondary | ICD-10-CM

## 2017-03-26 NOTE — Progress Notes (Signed)
03/26/17 patient referred to [place] for CPAP set up.

## 2017-04-03 DIAGNOSIS — Z7984 Long term (current) use of oral hypoglycemic drugs: Secondary | ICD-10-CM | POA: Diagnosis not present

## 2017-04-03 DIAGNOSIS — Z79899 Other long term (current) drug therapy: Secondary | ICD-10-CM | POA: Diagnosis not present

## 2017-04-03 DIAGNOSIS — I872 Venous insufficiency (chronic) (peripheral): Secondary | ICD-10-CM | POA: Diagnosis not present

## 2017-04-03 DIAGNOSIS — E7800 Pure hypercholesterolemia, unspecified: Secondary | ICD-10-CM | POA: Diagnosis not present

## 2017-04-03 DIAGNOSIS — R600 Localized edema: Secondary | ICD-10-CM | POA: Diagnosis not present

## 2017-04-03 DIAGNOSIS — D649 Anemia, unspecified: Secondary | ICD-10-CM | POA: Diagnosis not present

## 2017-04-03 DIAGNOSIS — R7309 Other abnormal glucose: Secondary | ICD-10-CM | POA: Diagnosis not present

## 2017-04-03 DIAGNOSIS — E119 Type 2 diabetes mellitus without complications: Secondary | ICD-10-CM | POA: Diagnosis not present

## 2017-04-03 DIAGNOSIS — Z0001 Encounter for general adult medical examination with abnormal findings: Secondary | ICD-10-CM | POA: Diagnosis not present

## 2017-04-07 ENCOUNTER — Other Ambulatory Visit (HOSPITAL_COMMUNITY): Payer: Self-pay | Admitting: Interventional Radiology

## 2017-04-07 DIAGNOSIS — L97929 Non-pressure chronic ulcer of unspecified part of left lower leg with unspecified severity: Secondary | ICD-10-CM

## 2017-04-21 DIAGNOSIS — Z79899 Other long term (current) drug therapy: Secondary | ICD-10-CM | POA: Diagnosis not present

## 2017-04-22 DIAGNOSIS — H43813 Vitreous degeneration, bilateral: Secondary | ICD-10-CM | POA: Diagnosis not present

## 2017-04-22 DIAGNOSIS — E119 Type 2 diabetes mellitus without complications: Secondary | ICD-10-CM | POA: Diagnosis not present

## 2017-04-30 ENCOUNTER — Other Ambulatory Visit (HOSPITAL_COMMUNITY): Payer: Self-pay | Admitting: Interventional Radiology

## 2017-04-30 ENCOUNTER — Ambulatory Visit
Admission: RE | Admit: 2017-04-30 | Discharge: 2017-04-30 | Disposition: A | Discharge status: Home / Self Care | Payer: Medicare Other | Source: Ambulatory Visit | Attending: Interventional Radiology | Admitting: Interventional Radiology

## 2017-04-30 ENCOUNTER — Other Ambulatory Visit: Payer: Medicare Other

## 2017-04-30 ENCOUNTER — Ambulatory Visit: Admission: RE | Admit: 2017-04-30 | Payer: Medicare Other | Source: Ambulatory Visit

## 2017-04-30 DIAGNOSIS — I83812 Varicose veins of left lower extremities with pain: Secondary | ICD-10-CM

## 2017-04-30 DIAGNOSIS — L97929 Non-pressure chronic ulcer of unspecified part of left lower leg with unspecified severity: Secondary | ICD-10-CM

## 2017-04-30 DIAGNOSIS — L97902 Non-pressure chronic ulcer of unspecified part of unspecified lower leg with fat layer exposed: Secondary | ICD-10-CM | POA: Diagnosis not present

## 2017-05-01 ENCOUNTER — Other Ambulatory Visit: Payer: Medicare Other

## 2017-06-02 ENCOUNTER — Ambulatory Visit
Admission: RE | Admit: 2017-06-02 | Discharge: 2017-06-02 | Disposition: A | Discharge status: Home / Self Care | Payer: Medicare Other | Source: Ambulatory Visit | Attending: Interventional Radiology | Admitting: Interventional Radiology

## 2017-06-02 ENCOUNTER — Ambulatory Visit: Admission: RE | Admit: 2017-06-02 | Payer: Medicare Other | Source: Ambulatory Visit

## 2017-06-02 DIAGNOSIS — I83812 Varicose veins of left lower extremities with pain: Secondary | ICD-10-CM

## 2017-06-02 DIAGNOSIS — I8392 Asymptomatic varicose veins of left lower extremity: Secondary | ICD-10-CM | POA: Diagnosis not present

## 2017-06-02 NOTE — Progress Notes (Signed)
Patient ID: Jenna [name], female   DOB: 11-26-1931, 83 y.o.   MRN: [id number]       Chief Complaint: Patient was seen in consultation today for varicose vein follow-up at the request of Lavette Yankovich  Referring Physician(s): Koirala,Dibas  History of Present Illness: Jenna [name] is a 83 y.o. female  Who  had been treated for medial left calf ulceration and cellulitis, which was slow to heal. She had a remote history of some sort of vein resection in her medial calf. No interval history of DVT, superficial thrombophlebitis, or pulmonary embolism. No family history of varicose/spider veins.  Her prescribed thigh-high compression hose provide some degree of incomplete relief. She is retired. She is not using any pain medications. She did note bilateral lower extremity edema. She had left knee replacement surgery approximately 8.5 years ago. She had described left lower extremity tiredness and notes color changes around the ankle. She has no pain, cellulitis, or ulceration on the right.   She underwent transcatheter occlusion of the left great saphenous vein 09/30/2016.  Her leg swelling significantly improved. She is gone down to a smaller shoe size. She feels like her presenting symptoms have resolved. No new evidence of cellulitis or any calcaneal features. She's happy with her result.  Past Medical History:  Diagnosis Date  . Arthritis   . Cancer [facility])    endometrial cancer  . Cellulitis of left lower extremity   . Diabetes mellitus   . Hyperlipemia   . Hypertension   . Non-healing ulcer of lower leg (HCC)    Left leg    . OAB (overactive bladder)   . Obesity    BMI 45    Past Surgical History:  Procedure Laterality Date  . ABDOMINAL HYSTERECTOMY  02/2008   robotic hyst BSO Bilateral LND  . EYE SURGERY     cataract  . IR GENERIC HISTORICAL  09/30/2016   IR EMBO VENOUS NOT HEMORR HEMANG  INC GUIDE ROADMAPPING [facility] ULTRASOUND  . JOINT REPLACEMENT     knee  . KNEE  SURGERY     left knee  . LAPAROSCOPIC CHOLECYSTECTOMY SINGLE PORT N/A 07/04/2014   Procedure: LAPAROSCOPIC CHOLECYSTECTOMY SINGLE PORT;  Surgeon: Ardeth Sportsman, MD;  Location: [facility];  Service: General;  Laterality: N/A;  . ROTATOR CUFF REPAIR     right arm  . TUBAL LIGATION      Allergies: Shrimp [shellfish allergy] and Ancef [cefazolin]  Medications: Prior to Admission medications   Medication Sig Start Date End Date Taking? Authorizing Provider  amLODipine (NORVASC) 5 MG tablet Take 5 mg by mouth daily. 12/18/16   [provider]  aspirin 325 MG tablet Take 325 mg by mouth daily.    [provider]  atorvastatin (LIPITOR) 20 MG tablet Take 20 mg by mouth daily.      [provider]  brimonidine-timolol (COMBIGAN) 0.2-0.5 % ophthalmic solution Place 1 drop into both eyes every 12 (twelve) hours.    [provider]  glipiZIDE (GLUCOTROL XL) 2.5 MG 24 hr tablet Take 2.5 mg by mouth daily.      [provider]  hydrochlorothiazide (HYDRODIURIL) 25 MG tablet Take 25 mg by mouth daily.  05/16/14   [provider]  latanoprost (XALATAN) 0.005 % ophthalmic solution Place 1 drop into both eyes at bedtime.     [provider]  losartan (COZAAR) 100 MG tablet Take 100 mg by mouth daily.  05/16/14   [provider]  metFORMIN (GLUCOPHAGE)  1000 MG tablet Take 1,000 mg by mouth 2 (two) times daily with a meal.    [provider]  naproxen (NAPROSYN) 375 MG tablet Take 1 tablet (375 mg total) by mouth 2 (two) times daily. 06/29/16   Gwyneth Sprout, MD  tolterodine (DETROL LA) 4 MG 24 hr capsule Take 4 mg by mouth daily. 05/12/16   [provider]     Family History  Problem Relation Age of Onset  . Cancer Brother 52       rectal  . Early death Brother     Social History   Social History  . Marital status: Married    Spouse name: N/A  . Number of children: N/A  . Years of education: N/A   Social  History Main Topics  . Smoking status: Never Smoker  . Smokeless tobacco: Never Used  . Alcohol use No  . Drug use: No  . Sexual activity: No   Other Topics Concern  . Not on file   Social History Narrative  . No narrative on file    ECOG Status: 1 - Symptomatic but completely ambulatory  Review of Systems: A 12 point ROS discussed and pertinent positives are indicated in the HPI above.  All other systems are negative.  Review of Systems  Vital Signs: There were no vitals taken for this visit.  Physical Exam  Mallampati Score:     Imaging:  LEFT LOWER EXTREMITY VENOUS DOPPLER ULTRASOUND  TECHNIQUE: Gray-scale sonography with compression, as well as color and duplex ultrasound, were performed to evaluate the deep venous system from the level of the common femoral vein through the popliteal and proximal calf veins.  COMPARISON:  12/03/2016 and previous  FINDINGS: Normal compressibility of the common femoral, superficial femoral, and popliteal veins, as well as the proximal calf veins. No filling defects to suggest DVT on grayscale or color Doppler imaging. Doppler waveforms show normal direction of venous flow, normal respiratory phasicity and response to augmentation.  The GSV is patent just peripheral to the level of the saphenofemoral junction, occluded from proximal cauda thigh to proximal calf, patent in the mid and inferior calf. There is a patent lateral accessory branch of the GSV extending to mid calf with associated dilated perforator branches.  IMPRESSION: 1. No evidence of  lower extremity deep vein thrombosis. 2. Continued durable closure of the treated segment of the left greater saphenous vein. 3. Patent lateral accessory branch of the GSV extending to the calf.   Electronically Signed   By: Corlis Leak M.D.   On: 05/01/2017 10:41 Labs:  CBC:  Recent Labs  07/07/16 1747 07/09/16 0516  WBC 6.4 4.9  HGB 12.0 11.3*  HCT 37.1  34.7*  PLT 298 290    COAGS: No results for input(s): INR, APTT in the last 8760 hours.  BMP:  Recent Labs  07/07/16 1747 07/08/16 0523 07/09/16 0516  NA 139 142 138  K 3.9 3.9 3.8  CL 106 111 105  CO2 26 26 27   GLUCOSE 85 108* 118*  BUN 12 9 10   CALCIUM 8.8* 8.5* 8.7*  CREATININE 0.62 0.61 0.63  GFRNONAA >60 >60 >60  GFRAA >60 >60 >60    LIVER FUNCTION TESTS:  Recent Labs  07/07/16 1747 07/09/16 0516  BILITOT 0.6 0.5  AST 19 18  ALT 17 16  ALKPHOS 107 98  PROT 7.2 6.6  ALBUMIN 3.9 3.7    TUMOR MARKERS: No results for input(s): AFPTM, CEA, CA199, CHROMGRNA in the  last 8760 hours.  Assessment and Plan:  My impression is that she has done very well post left great saphenous vein transcatheter embolization. She feels like her presenting symptoms have resolved and is satisfied. No evident complications. Ultrasound looks good.  We discussed the pathophysiology of varicose vein disease. I think  she has a high likelihood of permanent closure of the treated left great saphenous vein. We discussed the potential management of continuing to use the graduated compression hose when up and about to minimize progression of any venous valvular incompetence, reflux, and associated symptoms in the lower extremities. She is satisfied and had her questions answered. She knows to call should she have any new lower extremity symptoms which she thinks might be related to her varicose veins, and we can see her back on a when necessary basis.  Thank you for this interesting consult.  I greatly enjoyed meeting Jenna [name] and look forward to participating in their care.  A copy of this report was sent to the requesting provider on this date.  Electronically Signed: Brayley Mackowiak III, Jenna [name] 06/02/2017, 11:49 AM   I spent a total of    25 Minutes in face to face in clinical consultation, greater than 50% of which was counseling/coordinating care for symptomatic left lower extremity  varicose veins, post transcatheter occlusion.

## 2017-06-19 DIAGNOSIS — E119 Type 2 diabetes mellitus without complications: Secondary | ICD-10-CM | POA: Diagnosis not present

## 2017-06-19 DIAGNOSIS — I10 Essential (primary) hypertension: Secondary | ICD-10-CM | POA: Diagnosis not present

## 2017-06-19 DIAGNOSIS — E7800 Pure hypercholesterolemia, unspecified: Secondary | ICD-10-CM | POA: Diagnosis not present

## 2017-06-24 DIAGNOSIS — M25572 Pain in left ankle and joints of left foot: Secondary | ICD-10-CM | POA: Diagnosis not present

## 2017-07-10 DIAGNOSIS — I890 Lymphedema, not elsewhere classified: Secondary | ICD-10-CM | POA: Diagnosis not present

## 2017-07-10 DIAGNOSIS — I10 Essential (primary) hypertension: Secondary | ICD-10-CM | POA: Diagnosis not present

## 2017-07-24 DIAGNOSIS — I10 Essential (primary) hypertension: Secondary | ICD-10-CM | POA: Diagnosis not present

## 2017-07-24 DIAGNOSIS — R600 Localized edema: Secondary | ICD-10-CM | POA: Diagnosis not present

## 2017-07-24 DIAGNOSIS — Z7984 Long term (current) use of oral hypoglycemic drugs: Secondary | ICD-10-CM | POA: Diagnosis not present

## 2017-07-24 DIAGNOSIS — Z79899 Other long term (current) drug therapy: Secondary | ICD-10-CM | POA: Diagnosis not present

## 2017-07-24 DIAGNOSIS — E119 Type 2 diabetes mellitus without complications: Secondary | ICD-10-CM | POA: Diagnosis not present

## 2017-08-23 ENCOUNTER — Emergency Department (HOSPITAL_COMMUNITY): Payer: No Typology Code available for payment source

## 2017-08-23 ENCOUNTER — Encounter (HOSPITAL_COMMUNITY): Payer: Self-pay | Admitting: Emergency Medicine

## 2017-08-23 ENCOUNTER — Emergency Department (HOSPITAL_COMMUNITY)
Admission: EM | Admit: 2017-08-23 | Discharge: 2017-08-23 | Disposition: A | Discharge status: Home / Self Care | Payer: No Typology Code available for payment source | Attending: Emergency Medicine | Admitting: Emergency Medicine

## 2017-08-23 DIAGNOSIS — E119 Type 2 diabetes mellitus without complications: Secondary | ICD-10-CM | POA: Insufficient documentation

## 2017-08-23 DIAGNOSIS — Y999 Unspecified external cause status: Secondary | ICD-10-CM | POA: Insufficient documentation

## 2017-08-23 DIAGNOSIS — Z8542 Personal history of malignant neoplasm of other parts of uterus: Secondary | ICD-10-CM | POA: Insufficient documentation

## 2017-08-23 DIAGNOSIS — I10 Essential (primary) hypertension: Secondary | ICD-10-CM | POA: Insufficient documentation

## 2017-08-23 DIAGNOSIS — S40021A Contusion of right upper arm, initial encounter: Secondary | ICD-10-CM

## 2017-08-23 DIAGNOSIS — S5011XA Contusion of right forearm, initial encounter: Secondary | ICD-10-CM | POA: Insufficient documentation

## 2017-08-23 DIAGNOSIS — Y92410 Unspecified street and highway as the place of occurrence of the external cause: Secondary | ICD-10-CM | POA: Insufficient documentation

## 2017-08-23 DIAGNOSIS — Y939 Activity, unspecified: Secondary | ICD-10-CM | POA: Diagnosis not present

## 2017-08-23 DIAGNOSIS — Z7984 Long term (current) use of oral hypoglycemic drugs: Secondary | ICD-10-CM | POA: Insufficient documentation

## 2017-08-23 DIAGNOSIS — S59911A Unspecified injury of right forearm, initial encounter: Secondary | ICD-10-CM | POA: Diagnosis not present

## 2017-08-23 DIAGNOSIS — S6991XA Unspecified injury of right wrist, hand and finger(s), initial encounter: Secondary | ICD-10-CM | POA: Diagnosis not present

## 2017-08-23 DIAGNOSIS — Z79899 Other long term (current) drug therapy: Secondary | ICD-10-CM | POA: Insufficient documentation

## 2017-08-23 DIAGNOSIS — M79641 Pain in right hand: Secondary | ICD-10-CM | POA: Diagnosis not present

## 2017-08-23 MED ORDER — ACETAMINOPHEN 325 MG PO TABS
650.0000 mg | ORAL_TABLET | Freq: Once | ORAL | Status: AC
  Administered 2017-08-23: 650 mg via ORAL
  Filled 2017-08-23: qty 2
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
## 2017-08-26 DIAGNOSIS — S161XXA Strain of muscle, fascia and tendon at neck level, initial encounter: Secondary | ICD-10-CM | POA: Diagnosis not present

## 2017-08-26 DIAGNOSIS — M79631 Pain in right forearm: Secondary | ICD-10-CM | POA: Diagnosis not present

## 2017-10-21 DIAGNOSIS — E119 Type 2 diabetes mellitus without complications: Secondary | ICD-10-CM | POA: Diagnosis not present

## 2017-12-01 DIAGNOSIS — I872 Venous insufficiency (chronic) (peripheral): Secondary | ICD-10-CM | POA: Diagnosis not present

## 2017-12-01 DIAGNOSIS — Z23 Encounter for immunization: Secondary | ICD-10-CM | POA: Diagnosis not present

## 2017-12-01 DIAGNOSIS — E7800 Pure hypercholesterolemia, unspecified: Secondary | ICD-10-CM | POA: Diagnosis not present

## 2017-12-01 DIAGNOSIS — E119 Type 2 diabetes mellitus without complications: Secondary | ICD-10-CM | POA: Diagnosis not present

## 2018-01-14 DIAGNOSIS — Z1231 Encounter for screening mammogram for malignant neoplasm of breast: Secondary | ICD-10-CM | POA: Diagnosis not present

## 2018-01-26 DIAGNOSIS — Z01419 Encounter for gynecological examination (general) (routine) without abnormal findings: Secondary | ICD-10-CM | POA: Diagnosis not present

## 2018-01-26 DIAGNOSIS — Z6841 Body Mass Index (BMI) 40.0 and over, adult: Secondary | ICD-10-CM | POA: Diagnosis not present

## 2018-01-26 DIAGNOSIS — Z1231 Encounter for screening mammogram for malignant neoplasm of breast: Secondary | ICD-10-CM | POA: Diagnosis not present

## 2018-01-26 DIAGNOSIS — C541 Malignant neoplasm of endometrium: Secondary | ICD-10-CM | POA: Diagnosis not present

## 2018-01-26 DIAGNOSIS — N3281 Overactive bladder: Secondary | ICD-10-CM | POA: Diagnosis not present

## 2018-04-19 DIAGNOSIS — E7800 Pure hypercholesterolemia, unspecified: Secondary | ICD-10-CM | POA: Diagnosis not present

## 2018-04-19 DIAGNOSIS — Z0001 Encounter for general adult medical examination with abnormal findings: Secondary | ICD-10-CM | POA: Diagnosis not present

## 2018-04-19 DIAGNOSIS — I872 Venous insufficiency (chronic) (peripheral): Secondary | ICD-10-CM | POA: Diagnosis not present

## 2018-04-19 DIAGNOSIS — I10 Essential (primary) hypertension: Secondary | ICD-10-CM | POA: Diagnosis not present

## 2018-04-19 DIAGNOSIS — Z79899 Other long term (current) drug therapy: Secondary | ICD-10-CM | POA: Diagnosis not present

## 2018-04-19 DIAGNOSIS — I890 Lymphedema, not elsewhere classified: Secondary | ICD-10-CM | POA: Diagnosis not present

## 2018-04-19 DIAGNOSIS — E119 Type 2 diabetes mellitus without complications: Secondary | ICD-10-CM | POA: Diagnosis not present

## 2018-04-22 DIAGNOSIS — H401132 Primary open-angle glaucoma, bilateral, moderate stage: Secondary | ICD-10-CM | POA: Diagnosis not present

## 2018-04-22 DIAGNOSIS — E119 Type 2 diabetes mellitus without complications: Secondary | ICD-10-CM | POA: Diagnosis not present

## 2018-04-22 DIAGNOSIS — H43813 Vitreous degeneration, bilateral: Secondary | ICD-10-CM | POA: Diagnosis not present

## 2018-05-03 DIAGNOSIS — I872 Venous insufficiency (chronic) (peripheral): Secondary | ICD-10-CM | POA: Diagnosis not present

## 2018-05-03 DIAGNOSIS — L97921 Non-pressure chronic ulcer of unspecified part of left lower leg limited to breakdown of skin: Secondary | ICD-10-CM | POA: Diagnosis not present

## 2018-05-03 DIAGNOSIS — R600 Localized edema: Secondary | ICD-10-CM | POA: Diagnosis not present

## 2018-05-06 DIAGNOSIS — Z79899 Other long term (current) drug therapy: Secondary | ICD-10-CM | POA: Diagnosis not present

## 2018-05-17 DIAGNOSIS — I872 Venous insufficiency (chronic) (peripheral): Secondary | ICD-10-CM | POA: Diagnosis not present

## 2018-05-17 DIAGNOSIS — L97921 Non-pressure chronic ulcer of unspecified part of left lower leg limited to breakdown of skin: Secondary | ICD-10-CM | POA: Diagnosis not present

## 2018-05-17 DIAGNOSIS — R600 Localized edema: Secondary | ICD-10-CM | POA: Diagnosis not present

## 2018-05-18 ENCOUNTER — Encounter (HOSPITAL_COMMUNITY): Payer: Self-pay

## 2018-05-18 ENCOUNTER — Emergency Department (HOSPITAL_COMMUNITY): Payer: Medicare Other

## 2018-05-18 ENCOUNTER — Other Ambulatory Visit: Payer: Self-pay

## 2018-05-18 ENCOUNTER — Emergency Department (HOSPITAL_COMMUNITY)
Admission: EM | Admit: 2018-05-18 | Discharge: 2018-05-18 | Disposition: A | Discharge status: Home / Self Care | Payer: Medicare Other | Attending: Emergency Medicine | Admitting: Emergency Medicine

## 2018-05-18 DIAGNOSIS — R51 Headache: Secondary | ICD-10-CM | POA: Insufficient documentation

## 2018-05-18 DIAGNOSIS — Z79899 Other long term (current) drug therapy: Secondary | ICD-10-CM | POA: Insufficient documentation

## 2018-05-18 DIAGNOSIS — E119 Type 2 diabetes mellitus without complications: Secondary | ICD-10-CM | POA: Insufficient documentation

## 2018-05-18 DIAGNOSIS — I10 Essential (primary) hypertension: Secondary | ICD-10-CM | POA: Insufficient documentation

## 2018-05-18 DIAGNOSIS — G4489 Other headache syndrome: Secondary | ICD-10-CM | POA: Diagnosis not present

## 2018-05-18 DIAGNOSIS — Z7982 Long term (current) use of aspirin: Secondary | ICD-10-CM | POA: Insufficient documentation

## 2018-05-18 DIAGNOSIS — R519 Headache, unspecified: Secondary | ICD-10-CM

## 2018-05-18 DIAGNOSIS — Z7984 Long term (current) use of oral hypoglycemic drugs: Secondary | ICD-10-CM | POA: Diagnosis not present

## 2018-05-18 LAB — BASIC METABOLIC PANEL
ANION GAP: 9 (ref 5–15)
BUN: 10 mg/dL (ref 6–20)
CALCIUM: 9 mg/dL (ref 8.9–10.3)
CO2: 26 mmol/L (ref 22–32)
Chloride: 107 mmol/L (ref 101–111)
Creatinine, Ser: 0.71 mg/dL (ref 0.44–1.00)
Glucose, Bld: 138 mg/dL — ABNORMAL HIGH (ref 65–99)
Potassium: 4.1 mmol/L (ref 3.5–5.1)
Sodium: 142 mmol/L (ref 135–145)

## 2018-05-18 LAB — CBC
HEMATOCRIT: 39.6 % (ref 36.0–46.0)
Hemoglobin: 12.8 g/dL (ref 12.0–15.0)
MCH: 27.2 pg (ref 26.0–34.0)
MCHC: 32.3 g/dL (ref 30.0–36.0)
MCV: 84.3 fL (ref 78.0–100.0)
PLATELETS: 287 10*3/uL (ref 150–400)
RBC: 4.7 MIL/uL (ref 3.87–5.11)
RDW: 14.2 % (ref 11.5–15.5)
WBC: 5.2 10*3/uL (ref 4.0–10.5)

## 2018-05-18 MED ORDER — SODIUM CHLORIDE 0.9 % IV SOLN
INTRAVENOUS | Status: DC
  Administered 2018-05-18: 11:00:00 via INTRAVENOUS

## 2018-05-18 MED ORDER — IOPAMIDOL (ISOVUE-370) INJECTION 76%
INTRAVENOUS | Status: AC
  Filled 2018-05-18: qty 100

## 2018-05-18 MED ORDER — MORPHINE SULFATE (PF) 4 MG/ML IV SOLN
4.0000 mg | Freq: Once | INTRAVENOUS | Status: AC
  Administered 2018-05-18: 4 mg via INTRAVENOUS
  Filled 2018-05-18: qty 1

## 2018-05-18 MED ORDER — IOPAMIDOL (ISOVUE-370) INJECTION 76%
100.0000 mL | Freq: Once | INTRAVENOUS | Status: AC | PRN
  Administered 2018-05-18: 100 mL via INTRAVENOUS

## 2018-05-18 MED ORDER — BUTALBITAL-APAP-CAFFEINE 50-325-40 MG PO TABS: 1.0000 | ORAL_TABLET | Freq: Four times a day (QID) | ORAL | 0 refills | Status: AC | PRN

## 2018-05-18 MED ORDER — PROCHLORPERAZINE EDISYLATE 10 MG/2ML IJ SOLN
10.0000 mg | Freq: Once | INTRAMUSCULAR | Status: AC
  Administered 2018-05-18: 10 mg via INTRAVENOUS
  Filled 2018-05-18: qty 2

## 2018-05-18 NOTE — ED Triage Notes (Signed)
Headache on and off x1 week. No relief with ibuprofen. No dizziness.  164/88 HR 83 FSBG 133
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
## 2018-05-18 NOTE — Discharge Instructions (Signed)
Take the medications as needed for headache, follow-up with your primary care doctor

## 2018-05-18 NOTE — ED Notes (Signed)
Patient transported to CT 

## 2018-05-18 NOTE — ED Provider Notes (Signed)
[place] DEPT Provider Note   CSN: [id number] Arrival date & time: 05/18/18  0900     History   Chief Complaint Chief Complaint  Patient presents with  . Headache    HPI Jenna [name] is a 84 y.o. female.  HPI Pt states she started having sharp pain on the right side of her head that started about a week ago.  Pain seems to come and go.  She denies any trouble with speech issues, weakness, numbness.  She denies any trouble with fevers or chills or neck pain she has a history of a cerebral aneurysm that was diagnosed by MRI many years ago.  She has not had any issues with headaches since that time. Past Medical History:  Diagnosis Date  . Arthritis   . Cancer [facility])    endometrial cancer  . Cellulitis of left lower extremity   . Diabetes mellitus   . Hyperlipemia   . Hypertension   . Non-healing ulcer of lower leg (HCC)    Left leg    . OAB (overactive bladder)   . Obesity    BMI 45    Patient Active Problem List   Diagnosis Date Noted  . OSA (obstructive sleep apnea) 02/08/2017  . Murmur, cardiac 12/31/2016  . Morbid obesity ([place]) 12/31/2016  . Cellulitis of left leg 07/07/2016  . Venous stasis ulcer of left lower extremity ([place]) 07/07/2016  . Chronic cholecystitis with calculus s/p lap chole 07/04/2014 07/03/2014  . HTN (hypertension) 05/31/2012  . Non-insulin dependent type 2 diabetes mellitus ([place]) 05/31/2012  . Hypercholesteremia 05/31/2012  . OAB (overactive bladder)   . Malignant neoplasm of corpus uteri, except isthmus ([place]) 12/10/2011    Class: Stage 1    Past Surgical History:  Procedure Laterality Date  . ABDOMINAL HYSTERECTOMY  02/2008   robotic hyst BSO Bilateral LND  . EYE SURGERY     cataract  . IR GENERIC HISTORICAL  09/30/2016   IR EMBO VENOUS NOT HEMORR HEMANG  INC GUIDE ROADMAPPING [facility] ULTRASOUND  . JOINT REPLACEMENT     knee  . KNEE SURGERY     left knee  . LAPAROSCOPIC CHOLECYSTECTOMY SINGLE PORT  N/A 07/04/2014   Procedure: LAPAROSCOPIC CHOLECYSTECTOMY SINGLE PORT;  Surgeon: Adin Hector, MD;  Location: [facility];  Service: General;  Laterality: N/A;  . ROTATOR CUFF REPAIR     right arm  . TUBAL LIGATION       OB History    Gravida  7   Para  7   Term  7   Preterm      AB      Living        SAB      TAB      Ectopic      Multiple      Live Births               Home Medications    Prior to Admission medications   Medication Sig Start Date End Date Taking? Authorizing Provider  amLODipine (NORVASC) 5 MG tablet Take 5 mg by mouth daily. 12/18/16  Yes [provider]  aspirin 325 MG tablet Take 325 mg by mouth daily.   Yes [provider]  atorvastatin (LIPITOR) 20 MG tablet Take 20 mg by mouth daily.     Yes [provider]  brimonidine-timolol (COMBIGAN) 0.2-0.5 % ophthalmic solution Place 1 drop into both eyes every 12 (twelve) hours.   Yes [provider]  doxycycline (VIBRAMYCIN) 100 MG capsule Take 100 mg by mouth 2 (two) times daily. 05/17/18  Yes [provider]  furosemide (LASIX) 40 MG tablet Take 40 mg by mouth daily. 02/19/18  Yes [provider]  latanoprost (XALATAN) 0.005 % ophthalmic solution Place 1 drop into both eyes at bedtime.    Yes [provider]  metFORMIN (GLUCOPHAGE) 1000 MG tablet Take 1,000 mg by mouth 2 (two) times daily with a meal.   Yes [provider]  silver sulfADIAZINE (SILVADENE) 1 % cream APPLY TO AFFECTED AREA TWICE A DAY 05/03/18  Yes [provider]  telmisartan (MICARDIS) 80 MG tablet Take 80 mg by mouth daily. 04/19/18  Yes [provider]  tolterodine (DETROL LA) 4 MG 24 hr capsule Take 4 mg by mouth daily. 05/12/16  Yes [provider]  butalbital-acetaminophen-caffeine (FIORICET, ESGIC) 50-325-40 MG tablet Take 1-2 tablets by mouth every 6 (six) hours as needed for headache. 05/18/18 05/18/19  Dorie Rank, MD  naproxen  (NAPROSYN) 375 MG tablet Take 1 tablet (375 mg total) by mouth 2 (two) times daily. Patient not taking: Reported on 05/18/2018 06/29/16   Blanchie Dessert, MD    Family History Family History  Problem Relation Age of Onset  . Cancer Brother 60       rectal  . Early death Brother     Social History Social History   Tobacco Use  . Smoking status: Never Smoker  . Smokeless tobacco: Never Used  Substance Use Topics  . Alcohol use: No  . Drug use: No     Allergies   Shrimp [shellfish allergy] and Ancef [cefazolin]   Review of Systems Review of Systems  All other systems reviewed and are negative.    Physical Exam Updated Vital Signs BP (!) 153/65   Pulse 74   Temp 97.8 F (36.6 C) (Oral)   Resp 14   Ht 1.524 m (5')   Wt 104.3 kg (230 lb)   SpO2 97%   BMI 44.92 kg/m   Physical Exam  Constitutional: She appears well-developed and well-nourished. No distress.  HENT:  Head: Normocephalic and atraumatic.  Right Ear: External ear normal.  Left Ear: External ear normal.  No tenderness palpation temporal artery region  Eyes: Conjunctivae are normal. Right eye exhibits no discharge. Left eye exhibits no discharge. No scleral icterus.  Neck: Neck supple. No tracheal deviation present.  Cardiovascular: Normal rate, regular rhythm and intact distal pulses.  Pulmonary/Chest: Effort normal and breath sounds normal. No stridor. No respiratory distress. She has no wheezes. She has no rales.  Abdominal: Soft. Bowel sounds are normal. She exhibits no distension. There is no tenderness. There is no rebound and no guarding.  Musculoskeletal: She exhibits no edema or tenderness.  Neurological: She is alert. She has normal strength. No cranial nerve deficit (no facial droop, extraocular movements intact, no slurred speech) or sensory deficit. She exhibits normal muscle tone. She displays no seizure activity. Coordination normal.  Skin: Skin is warm and dry. No rash noted.    Psychiatric: She has a normal mood and affect.  Nursing note and vitals reviewed.    ED Treatments / Results  Labs (all labs ordered are listed, but only abnormal results are displayed) Labs Reviewed  BASIC METABOLIC PANEL - Abnormal; Notable for the following components:      Result Value   Glucose, Bld 138 (*)    All other components within normal limits  CBC     Radiology Ct Angio  Head W Or Wo Contrast  Result Date: 05/18/2018 CLINICAL DATA:  Headache.  Rule out aneurysm EXAM: CT ANGIOGRAPHY HEAD TECHNIQUE: Multidetector CT imaging of the head was performed using the standard protocol during bolus administration of intravenous contrast. Multiplanar CT image reconstructions and MIPs were obtained to evaluate the vascular anatomy. CONTRAST:  144mL ISOVUE-370 IOPAMIDOL (ISOVUE-370) INJECTION 76% COMPARISON:  CT head 05/18/2018 FINDINGS: CTA HEAD Anterior circulation: Mild atherosclerotic calcification in the cavernous carotid bilaterally without significant stenosis. Negative for aneurysm. Anterior and middle cerebral arteries widely patent without stenosis or aneurysm Posterior circulation: Both vertebral arteries widely patent to the basilar. Basilar widely patent. PICA, superior cerebellar, and posterior cerebral arteries widely patent without stenosis or aneurysm Venous sinuses: Patent Anatomic variants: None Delayed phase: Chronic microvascular ischemic changes throughout the white matter. No acute hemorrhage. No enhancing mass lesion IMPRESSION: Negative for cerebral aneurysm. No evidence of subarachnoid hemorrhage Chronic microvascular ischemic changes throughout the white matter No significant intracranial stenosis. Electronically Signed   By: Franchot Gallo M.D.   On: 05/18/2018 13:42   Ct Head Wo Contrast  Result Date: 05/18/2018 CLINICAL DATA:  Headache off and on for 1 week, no relief with ibuprofen, RIGHT-side headache radiating to top of head terrible since last night; history  of endometrial cancer, diabetes mellitus, hypertension EXAM: CT HEAD WITHOUT CONTRAST TECHNIQUE: Contiguous axial images were obtained from the base of the skull through the vertex without intravenous contrast. Sagittal and coronal MPR images reconstructed from axial data set. COMPARISON:  08/22/2014 FINDINGS: Brain: Generalized atrophy. Normal ventricular morphology. No midline shift or mass effect. Small vessel chronic ischemic changes of deep cerebral white matter. No intracranial hemorrhage, mass lesion, evidence of acute infarction, or extra-axial fluid collection. Vascular: Atherosclerotic calcification of internal carotid and vertebral arteries at skull base Skull: Intact Sinuses/Orbits: Clear Other: N/A IMPRESSION: Atrophy with small vessel chronic ischemic changes of deep cerebral white matter. No acute intracranial abnormalities. Electronically Signed   By: Lavonia Dana M.D.   On: 05/18/2018 11:12    Procedures Procedures (including critical care time)  Medications Ordered in ED Medications  0.9 %  sodium chloride infusion ( Intravenous New Bag/Given 05/18/18 1043)  iopamidol (ISOVUE-370) 76 % injection (has no administration in time range)  prochlorperazine (COMPAZINE) injection 10 mg (10 mg Intravenous Given 05/18/18 1043)  morphine 4 MG/ML injection 4 mg (4 mg Intravenous Given 05/18/18 1043)  iopamidol (ISOVUE-370) 76 % injection 100 mL (100 mLs Intravenous Contrast Given 05/18/18 1308)     Initial Impression / Assessment and Plan / ED Course  I have reviewed the triage vital signs and the nursing notes.  Pertinent labs & imaging results that were available during my care of the patient were reviewed by me and considered in my medical decision making (see chart for details).   Patient presented to the emergency room for evaluation of headaches.  Patient is not showing any signs of infection.  No symptoms or findings to suggest temporal arteritis.  Patient's neurologic exam is normal.   No evidence of stroke.  CT and CT angios performed because of her history of having an intracerebral aneurysm.  No signs of any hemorrhage or aneurysm noted on CT scan.  Patient improved with treatment the emergency room.  Appears stable for discharge.  Final Clinical Impressions(s) / ED Diagnoses   Final diagnoses:  Acute nonintractable headache, unspecified headache type    ED Discharge Orders        Ordered    butalbital-acetaminophen-caffeine (FIORICET, ESGIC)  50-325-40 MG tablet  Every 6 hours PRN     05/18/18 1418       Dorie Rank, MD 05/18/18 1420

## 2018-05-26 ENCOUNTER — Encounter: Payer: Medicare Other | Attending: Internal Medicine | Admitting: Internal Medicine

## 2018-05-26 DIAGNOSIS — I890 Lymphedema, not elsewhere classified: Secondary | ICD-10-CM | POA: Diagnosis not present

## 2018-05-26 DIAGNOSIS — G4733 Obstructive sleep apnea (adult) (pediatric): Secondary | ICD-10-CM | POA: Diagnosis not present

## 2018-05-26 DIAGNOSIS — L97222 Non-pressure chronic ulcer of left calf with fat layer exposed: Secondary | ICD-10-CM | POA: Diagnosis not present

## 2018-05-26 DIAGNOSIS — Z8542 Personal history of malignant neoplasm of other parts of uterus: Secondary | ICD-10-CM | POA: Diagnosis not present

## 2018-05-26 DIAGNOSIS — E11622 Type 2 diabetes mellitus with other skin ulcer: Secondary | ICD-10-CM | POA: Diagnosis not present

## 2018-05-26 DIAGNOSIS — L97322 Non-pressure chronic ulcer of left ankle with fat layer exposed: Secondary | ICD-10-CM | POA: Diagnosis not present

## 2018-05-26 DIAGNOSIS — Z7984 Long term (current) use of oral hypoglycemic drugs: Secondary | ICD-10-CM | POA: Insufficient documentation

## 2018-05-26 DIAGNOSIS — I10 Essential (primary) hypertension: Secondary | ICD-10-CM | POA: Diagnosis not present

## 2018-05-26 DIAGNOSIS — I87332 Chronic venous hypertension (idiopathic) with ulcer and inflammation of left lower extremity: Secondary | ICD-10-CM | POA: Diagnosis not present

## 2018-05-27 NOTE — Progress Notes (Signed)
KELLYE, SAMP ([id number]) Visit Report for 05/26/2018 Abuse/Suicide Risk Screen Details Patient Name: Jenna [name], Jenna [name]. Date of Service: 05/26/2018 8:00 AM Medical Record Number: [id number] Patient Account Number: [id number] Date of Birth/Sex: [birth year] (84 y.o. F) Treating RN: Renne Crigler Primary Care Zacariah Belue: Darrow Bussing Other Clinician: Referring Archita Lomeli: Darrow Bussing Treating Raju Coppolino/Extender: Altamese [place] in Treatment: 0 Abuse/Suicide Risk Screen Items Answer ABUSE/SUICIDE RISK SCREEN: Has anyone close to you tried to hurt or harm you recentlyo No Do you feel uncomfortable with anyone in your familyo No Has anyone forced you do things that you didnot want to doo No Do you have any thoughts of harming yourselfo No Patient displays signs or symptoms of abuse and/or neglect. No Electronic Signature(s) Signed: 05/26/2018 4:15:53 PM By: Renne Crigler Entered By: Renne Crigler on 05/26/2018 08:21:35 Jenna [name] ([id number]) -------------------------------------------------------------------------------- Activities of Daily Living Details Patient Name: Jenna [name]. Date of Service: 05/26/2018 8:00 AM Medical Record Number: [id number] Patient Account Number: [id number] Date of Birth/Sex: 1931/04/11 (84 y.o. F) Treating RN: Renne Crigler Primary Care Lavern Maslow: Darrow Bussing Other Clinician: Referring Aidee Latimore: Darrow Bussing Treating Tiant Peixoto/Extender: Altamese [place] in Treatment: 0 Activities of Daily Living Items Answer Activities of Daily Living (Please select one for each item) Drive Automobile Not Able Take Medications Completely Able Use Telephone Completely Able Care for Appearance Completely Able Use Toilet Completely Able Bath / Shower Completely Able Dress Self Completely Able Feed Self Completely Able Walk Completely Able Get In / Out Bed Completely Able Housework Completely Able Prepare Meals Completely  Able Handle Money Completely Able Shop for Self Completely Able Electronic Signature(s) Signed: 05/26/2018 4:15:53 PM By: Renne Crigler Entered By: Renne Crigler on 05/26/2018 08:22:05 Jenna [name] ([id number]) -------------------------------------------------------------------------------- Education Assessment Details Patient Name: Jenna [name] Date of Service: 05/26/2018 8:00 AM Medical Record Number: [id number] Patient Account Number: [id number] Date of Birth/Sex: 10-28-1931 (84 y.o. F) Treating RN: Renne Crigler Primary Care Nuria Phebus: Darrow Bussing Other Clinician: Referring Manuelita Moxon: Darrow Bussing Treating Aliea Bobe/Extender: Altamese [place] in Treatment: 0 Primary Learner Assessed: Patient Learning Preferences/Education Level/Primary Language Learning Preference: Explanation Highest Education Level: High School Preferred Language: English Cognitive Barrier Assessment/Beliefs Language Barrier: No Translator Needed: No Memory Deficit: No Emotional Barrier: No Cultural/Religious Beliefs Affecting Medical Care: No Physical Barrier Assessment Impaired Vision: Yes Glasses Impaired Hearing: No Decreased Hand dexterity: No Knowledge/Comprehension Assessment Knowledge Level: High Comprehension Level: High Ability to understand written High instructions: Ability to understand verbal High instructions: Motivation Assessment Anxiety Level: Calm Cooperation: Cooperative Education Importance: Acknowledges Need Interest in Health Problems: Asks Questions Perception: Coherent Willingness to Engage in Self- High Management Activities: Readiness to Engage in Self- High Management Activities: Electronic Signature(s) Signed: 05/26/2018 4:15:53 PM By: Renne Crigler Entered By: Renne Crigler on 05/26/2018 08:22:33 DINARA, SCIANNA ([id number]) -------------------------------------------------------------------------------- Fall Risk Assessment  Details Patient Name: Jenna [name] Date of Service: 05/26/2018 8:00 AM Medical Record Number: [id number] Patient Account Number: [id number] Date of Birth/Sex: August 13, 1931 (84 y.o. F) Treating RN: Renne Crigler Primary Care Retha Bither: Darrow Bussing Other Clinician: Referring Ashea Winiarski: Darrow Bussing Treating Saraih Lorton/Extender: Altamese [place] in Treatment: 0 Fall Risk Assessment Items Have you had 2 or more falls in the last 12 monthso 0 No Have you had any fall that resulted in injury in the last 12 monthso 0 No FALL RISK ASSESSMENT: History of falling - immediate or within 3 months 0 No Secondary diagnosis 0 No Ambulatory aid None/bed rest/wheelchair/nurse 0 No Crutches/cane/walker 0 No Furniture 0  No IV Access/Saline Lock 0 No Gait/Training Normal/bed rest/immobile 0 No Weak 0 No Impaired 0 No Mental Status Oriented to own ability 0 No Electronic Signature(s) Signed: 05/26/2018 4:15:53 PM By: Renne Crigler Entered By: Renne Crigler on 05/26/2018 08:22:53 Jenna [name] ([id number]) -------------------------------------------------------------------------------- Foot Assessment Details Patient Name: Jenna [name] Date of Service: 05/26/2018 8:00 AM Medical Record Number: [id number] Patient Account Number: [id number] Date of Birth/Sex: 10/04/1931 (84 y.o. F) Treating RN: Renne Crigler Primary Care Deaysia Grigoryan: Darrow Bussing Other Clinician: Referring Namari Breton: Darrow Bussing Treating Loetta Connelley/Extender: Altamese [place] in Treatment: 0 Foot Assessment Items Site Locations + = Sensation present, - = Sensation absent, C = Callus, U = Ulcer R = Redness, W = Warmth, M = Maceration, PU = Pre-ulcerative lesion F = Fissure, S = Swelling, D = Dryness Assessment Right: Left: Other Deformity: No No Prior Foot Ulcer: No No Prior Amputation: No No Charcot Joint: No No Ambulatory Status: Ambulatory Without Help Gait: Steady Electronic  Signature(s) Signed: 05/26/2018 4:15:53 PM By: Renne Crigler Entered By: Renne Crigler on 05/26/2018 08:25:45 Jenna [name] ([id number]) -------------------------------------------------------------------------------- Nutrition Risk Assessment Details Patient Name: Jenna [name] Date of Service: 05/26/2018 8:00 AM Medical Record Number: [id number] Patient Account Number: [id number] Date of Birth/Sex: Mar 07, 1931 (84 y.o. F) Treating RN: Renne Crigler Primary Care Adelita Hone: Darrow Bussing Other Clinician: Referring Amilah Greenspan: Darrow Bussing Treating Zahli Vetsch/Extender: Altamese [place] in Treatment: 0 Height (in): 60 Weight (lbs): 232 Body Mass Index (BMI): 45.3 Nutrition Risk Assessment Items NUTRITION RISK SCREEN: I have an illness or condition that made me change the kind and/or amount of 0 No food I eat I eat fewer than two meals per day 0 No I eat few fruits and vegetables, or milk products 0 No I have three or more drinks of beer, liquor or wine almost every day 0 No I have tooth or mouth problems that make it hard for me to eat 0 No I don't always have enough money to buy the food I need 0 No I eat alone most of the time 0 No I take three or more different prescribed or over-the-counter drugs a day 0 No Without wanting to, I have lost or gained 10 pounds in the last six months 0 No I am not always physically able to shop, cook and/or feed myself 0 No Nutrition Protocols Good Risk Protocol 0 No interventions needed Moderate Risk Protocol Electronic Signature(s) Signed: 05/26/2018 4:15:53 PM By: Renne Crigler Entered By: Renne Crigler on 05/26/2018 08:22:58

## 2018-05-28 NOTE — Progress Notes (Signed)
Jenna [name], Jenna [name] ([id number]) Visit Report for 05/26/2018 Allergy List Details Patient Name: Jenna [name], Jenna [name]. Date of Service: 05/26/2018 8:00 AM Medical Record Number: [id number] Patient Account Number: [id number] Date of Birth/Sex: 28-Dec-1931 (84 y.o. F) Treating RN: Roger Shelter Primary Care Cincere Zorn: Lujean Amel Other Clinician: Referring Sinead Hockman: Lujean Amel Treating Angel [name]/Extender: Ricard Dillon Weeks in Treatment: 0 Allergies Active Allergies Shellfish Reaction: anaphlaxsis Severity: Severe Ancef Reaction: nausea and vomiting Severity: Severe Allergy Notes Electronic Signature(s) Signed: 05/26/2018 4:15:53 PM By: Roger Shelter Entered By: Roger Shelter on 05/26/2018 08:15:31 Jenna [name] ([id number]) -------------------------------------------------------------------------------- Arrival Information Details Patient Name: Jenna [name] Date of Service: 05/26/2018 8:00 AM Medical Record Number: [id number] Patient Account Number: [id number] Date of Birth/Sex: Jul 12, 1931 (84 y.o. F) Treating RN: Roger Shelter Primary Care Logan Baltimore: Lujean Amel Other Clinician: Referring Limuel Nieblas: Lujean Amel Treating Naleah Kofoed/Extender: Tito Dine in Treatment: 0 Visit Information Patient Arrived: Cane Arrival Time: 08:11 Accompanied By: self Transfer Assistance: None Patient Identification Verified: Yes Secondary Verification Process Completed: Yes History Since Last Visit All ordered tests and consults were completed: No Added or deleted any medications: No Any new allergies or adverse reactions: No Had a fall or experienced change in activities of daily living that may affect risk of falls: No Signs or symptoms of abuse/neglect since last visito No Hospitalized since last visit: No Implantable device outside of the clinic excluding cellular tissue based products placed in the center since last visit: No Electronic  Signature(s) Signed: 05/26/2018 4:15:53 PM By: Roger Shelter Entered By: Roger Shelter on 05/26/2018 08:12:29 Jenna [name] ([id number]) -------------------------------------------------------------------------------- Clinic Level of Care Assessment Details Patient Name: Jenna [name] Date of Service: 05/26/2018 8:00 AM Medical Record Number: [id number] Patient Account Number: [id number] Date of Birth/Sex: 11-01-1931 (84 y.o. F) Treating RN: Cornell Barman Primary Care Deniz Eskridge: Lujean Amel Other Clinician: Referring Chijioke Lasser: Lujean Amel Treating Nyaira Hodgens/Extender: Tito Dine in Treatment: 0 Clinic Level of Care Assessment Items TOOL 1 Quantity Score []  - Use when EandM and Procedure is performed on INITIAL visit 0 ASSESSMENTS - Nursing Assessment / Reassessment X - General Physical Exam (combine w/ comprehensive assessment (listed just below) when 1 20 performed on new pt. evals) X- 1 25 Comprehensive Assessment (HX, ROS, Risk Assessments, Wounds Hx, etc.) ASSESSMENTS - Wound and Skin Assessment / Reassessment []  - Dermatologic / Skin Assessment (not related to wound area) 0 ASSESSMENTS - Ostomy and/or Continence Assessment and Care []  - Incontinence Assessment and Management 0 []  - 0 Ostomy Care Assessment and Management (repouching, etc.) PROCESS - Coordination of Care X - Simple Patient / Family Education for ongoing care 1 15 []  - 0 Complex (extensive) Patient / Family Education for ongoing care X- 1 10 Staff obtains Programmer, systems, Records, Test Results / Process Orders []  - 0 Staff telephones HHA, Nursing Homes / Clarify orders / etc []  - 0 Routine Transfer to another Facility (non-emergent condition) []  - 0 Routine Hospital Admission (non-emergent condition) X- 1 15 New Admissions / Biomedical engineer / Ordering NPWT, Apligraf, etc. []  - 0 Emergency Hospital Admission (emergent condition) PROCESS - Special Needs []  - Pediatric / Minor  Patient Management 0 []  - 0 Isolation Patient Management []  - 0 Hearing / Language / Visual special needs []  - 0 Assessment of Community assistance (transportation, D/C planning, etc.) []  - 0 Additional assistance / Altered mentation []  - 0 Support Surface(s) Assessment (bed, cushion, seat, etc.) Jenna [name], Jenna [name]. ([id number]) INTERVENTIONS - Miscellaneous []  - External ear exam 0 []  -  Jenna [name], Jenna [name] ([id number]) Visit Report for 05/26/2018 Allergy List Details Patient Name: Jenna [name], Jenna [name]. Date of Service: 05/26/2018 8:00 AM Medical Record Number: [id number] Patient Account Number: [id number] Date of Birth/Sex: 28-Dec-1931 (84 y.o. F) Treating RN: Roger Shelter Primary Care Cincere Zorn: Lujean Amel Other Clinician: Referring Sinead Hockman: Lujean Amel Treating Angel [name]/Extender: Ricard Dillon Weeks in Treatment: 0 Allergies Active Allergies Shellfish Reaction: anaphlaxsis Severity: Severe Ancef Reaction: nausea and vomiting Severity: Severe Allergy Notes Electronic Signature(s) Signed: 05/26/2018 4:15:53 PM By: Roger Shelter Entered By: Roger Shelter on 05/26/2018 08:15:31 Jenna [name] ([id number]) -------------------------------------------------------------------------------- Arrival Information Details Patient Name: Jenna [name] Date of Service: 05/26/2018 8:00 AM Medical Record Number: [id number] Patient Account Number: [id number] Date of Birth/Sex: Jul 12, 1931 (84 y.o. F) Treating RN: Roger Shelter Primary Care Logan [place]: Lujean Amel Other Clinician: Referring Limuel Nieblas: Lujean Amel Treating Naleah Kofoed/Extender: Tito Dine in Treatment: 0 Visit Information Patient Arrived: Cane Arrival Time: 08:11 Accompanied By: self Transfer Assistance: None Patient Identification Verified: Yes Secondary Verification Process Completed: Yes History Since Last Visit All ordered tests and consults were completed: No Added or deleted any medications: No Any new allergies or adverse reactions: No Had a fall or experienced change in activities of daily living that may affect risk of falls: No Signs or symptoms of abuse/neglect since last visito No Hospitalized since last visit: No Implantable device outside of the clinic excluding cellular tissue based products placed in the center since last visit: No Electronic  Signature(s) Signed: 05/26/2018 4:15:53 PM By: Roger Shelter Entered By: Roger Shelter on 05/26/2018 08:12:29 Jenna [name] ([id number]) -------------------------------------------------------------------------------- Clinic Level of Care Assessment Details Patient Name: Jenna [name] Date of Service: 05/26/2018 8:00 AM Medical Record Number: [id number] Patient Account Number: [id number] Date of Birth/Sex: 11-01-1931 (84 y.o. F) Treating RN: Cornell Barman Primary Care Deniz Eskridge: Lujean Amel Other Clinician: Referring Chijioke Lasser: Lujean Amel Treating Nyaira Hodgens/Extender: Tito Dine in Treatment: 0 Clinic Level of Care Assessment Items TOOL 1 Quantity Score []  - Use when EandM and Procedure is performed on INITIAL visit 0 ASSESSMENTS - Nursing Assessment / Reassessment X - General Physical Exam (combine w/ comprehensive assessment (listed just below) when 1 20 performed on new pt. evals) X- 1 25 Comprehensive Assessment (HX, ROS, Risk Assessments, Wounds Hx, etc.) ASSESSMENTS - Wound and Skin Assessment / Reassessment []  - Dermatologic / Skin Assessment (not related to wound area) 0 ASSESSMENTS - Ostomy and/or Continence Assessment and Care []  - Incontinence Assessment and Management 0 []  - 0 Ostomy Care Assessment and Management (repouching, etc.) PROCESS - Coordination of Care X - Simple Patient / Family Education for ongoing care 1 15 []  - 0 Complex (extensive) Patient / Family Education for ongoing care X- 1 10 Staff obtains Programmer, systems, Records, Test Results / Process Orders []  - 0 Staff telephones HHA, Nursing Homes / Clarify orders / etc []  - 0 Routine Transfer to another Facility (non-emergent condition) []  - 0 Routine Hospital Admission (non-emergent condition) X- 1 15 New Admissions / Biomedical engineer / Ordering NPWT, Apligraf, etc. []  - 0 Emergency Hospital Admission (emergent condition) PROCESS - Special Needs []  - Pediatric / Minor  Patient Management 0 []  - 0 Isolation Patient Management []  - 0 Hearing / Language / Visual special needs []  - 0 Assessment of Community assistance (transportation, D/C planning, etc.) []  - 0 Additional assistance / Altered mentation []  - 0 Support Surface(s) Assessment (bed, cushion, seat, etc.) Jenna [name], Jenna [name]. ([id number]) INTERVENTIONS - Miscellaneous []  - External ear exam 0 []  -  0 Patient Transfer (multiple staff / Civil Service fast streamer / Similar devices) []  - 0 Simple Staple / Suture removal (25 or less) []  - 0 Complex Staple / Suture removal (26 or more) []  - 0 Hypo/Hyperglycemic Management (do not check if billed separately) X- 1 15 Ankle / Brachial Index (ABI) - do not check if billed separately Has the patient been seen at the hospital within the last three years: Yes Total Score: 100 Level Of Care: New/Established - Level 3 Electronic Signature(s) Signed: 05/26/2018 5:21:30 PM By: Gretta Cool, [name], RN, CWS, [name], [name] Entered By: Gretta Cool, [name], RN, CWS, Kim on 05/26/2018 09:00:47 Jenna [name] ([id number]) -------------------------------------------------------------------------------- Compression Therapy Details Patient Name: Jenna [name] Date of Service: 05/26/2018 8:00 AM Medical Record Number: [id number] Patient Account Number: [id number] Date of Birth/Sex: 1931/01/05 (84 y.o. F) Treating RN: Cornell Barman Primary Care Nallely Yost: Lujean Amel Other Clinician: Referring Yvette Roark: Lujean Amel Treating Marizol Borror/Extender: Tito Dine in Treatment: 0 Compression Therapy Performed for Wound Assessment: Wound #2 Left,Medial Lower Leg Performed By: Clinician Cornell Barman, RN Compression Type: Three Layer Pre Treatment ABI: 1 Post Procedure Diagnosis Same as Pre-procedure Electronic Signature(s) Signed: 05/26/2018 5:21:30 PM By: Gretta Cool, [name], RN, CWS, [name], [name] Entered By: Gretta Cool, [name], RN, CWS, Kim on 05/26/2018 08:59:42 Jenna [name]  ([id number]) -------------------------------------------------------------------------------- Encounter Discharge Information Details Patient Name: Jenna [name], Jenna [name]. Date of Service: 05/26/2018 8:00 AM Medical Record Number: [id number] Patient Account Number: [id number] Date of Birth/Sex: 04-14-1931 (84 y.o. F) Treating RN: Montey Hora Primary Care Jakobe Blau: Lujean Amel Other Clinician: Referring Solimar Maiden: Lujean Amel Treating Kavion Mancinas/Extender: Tito Dine in Treatment: 0 Encounter Discharge Information Items Discharge Condition: Stable Ambulatory Status: Cane Discharge Destination: Home Transportation: Private Auto Accompanied By: self Schedule Follow-up Appointment: Yes Clinical Summary of Care: Electronic Signature(s) Signed: 05/26/2018 11:40:37 AM By: Montey Hora Entered By: Montey Hora on 05/26/2018 11:40:36 Jenna [name] ([id number]) -------------------------------------------------------------------------------- Lower Extremity Assessment Details Patient Name: Jenna [name] Date of Service: 05/26/2018 8:00 AM Medical Record Number: [id number] Patient Account Number: [id number] Date of Birth/Sex: [birth year] (84 y.o. F) Treating RN: Roger Shelter Primary Care Veeda Virgo: Lujean Amel Other Clinician: Referring Delilah Mulgrew: Lujean Amel Treating Gola Bribiesca/Extender: Tito Dine in Treatment: 0 Edema Assessment Assessed: [Left: No] [Right: No] Edema: [Left: Yes] [Right: Yes] Calf Left: Right: Point of Measurement: 33 cm From Medial Instep 45 cm 43 cm Ankle Left: Right: Point of Measurement: 11 cm From Medial Instep 26.5 cm 26.5 cm Vascular Assessment Claudication: Claudication Assessment [Left:None] [Right:None] Pulses: Dorsalis Pedis Palpable: [Left:Yes] [Right:Yes] Posterior Tibial Extremity colors, hair growth, and conditions: Extremity Color: [Left:Hyperpigmented] [Right:Normal] Hair Growth on Extremity: [Left:No]  [Right:No] Temperature of Extremity: [Left:Warm] [Right:Cool] Capillary Refill: [Left:< 3 seconds] [Right:< 3 seconds] Blood Pressure: Brachial: [Left:160] [Right:152] Dorsalis Pedis: 162 [Left:Dorsalis Pedis: 148] Ankle: Posterior Tibial: 168 [Left:Posterior Tibial: 152 1.05] [Right:0.95] Toe Nail Assessment Left: Right: Thick: No No Discolored: No No Deformed: No No Improper Length and Hygiene: No No Electronic Signature(s) Signed: 05/26/2018 4:15:53 PM By: Roger Shelter Entered By: Roger Shelter on 05/26/2018 08:47:38 Fear, [name] ([id number]) -------------------------------------------------------------------------------- Multi Wound Chart Details Patient Name: Jenna [name] Date of Service: 05/26/2018 8:00 AM Medical Record Number: [id number] Patient Account Number: [id number] Date of Birth/Sex: 06-Dec-1931 (84 y.o. F) Treating RN: Cornell Barman Primary Care Adonnis Salceda: Lujean Amel Other Clinician: Referring Kemani Demarais: Lujean Amel Treating Iantha Titsworth/Extender: Tito Dine in Treatment: 0 Vital Signs Height(in): 60 Pulse(bpm): 70 Weight(lbs): 232 Blood Pressure(mmHg): Body Mass Index(BMI): 45 Temperature(F): 97.9  0 Patient Transfer (multiple staff / Civil Service fast streamer / Similar devices) []  - 0 Simple Staple / Suture removal (25 or less) []  - 0 Complex Staple / Suture removal (26 or more) []  - 0 Hypo/Hyperglycemic Management (do not check if billed separately) X- 1 15 Ankle / Brachial Index (ABI) - do not check if billed separately Has the patient been seen at the hospital within the last three years: Yes Total Score: 100 Level Of Care: New/Established - Level 3 Electronic Signature(s) Signed: 05/26/2018 5:21:30 PM By: Gretta Cool, [name], RN, CWS, [name], [name] Entered By: Gretta Cool, [name], RN, CWS, Kim on 05/26/2018 09:00:47 Jenna [name] ([id number]) -------------------------------------------------------------------------------- Compression Therapy Details Patient Name: Jenna [name] Date of Service: 05/26/2018 8:00 AM Medical Record Number: [id number] Patient Account Number: [id number] Date of Birth/Sex: 1931/01/05 (84 y.o. F) Treating RN: Cornell Barman Primary Care Nallely Yost: Lujean Amel Other Clinician: Referring Yvette Roark: Lujean Amel Treating Marizol Borror/Extender: Tito Dine in Treatment: 0 Compression Therapy Performed for Wound Assessment: Wound #2 Left,Medial Lower Leg Performed By: Clinician Cornell Barman, RN Compression Type: Three Layer Pre Treatment ABI: 1 Post Procedure Diagnosis Same as Pre-procedure Electronic Signature(s) Signed: 05/26/2018 5:21:30 PM By: Gretta Cool, [name], RN, CWS, [name], [name] Entered By: Gretta Cool, [name], RN, CWS, Kim on 05/26/2018 08:59:42 Jenna [name]  ([id number]) -------------------------------------------------------------------------------- Encounter Discharge Information Details Patient Name: Jenna [name], Jenna [name]. Date of Service: 05/26/2018 8:00 AM Medical Record Number: [id number] Patient Account Number: [id number] Date of Birth/Sex: 04-14-1931 (84 y.o. F) Treating RN: Montey Hora Primary Care Jakobe Blau: Lujean Amel Other Clinician: Referring Solimar Maiden: Lujean Amel Treating Kavion Mancinas/Extender: Tito Dine in Treatment: 0 Encounter Discharge Information Items Discharge Condition: Stable Ambulatory Status: Cane Discharge Destination: Home Transportation: Private Auto Accompanied By: self Schedule Follow-up Appointment: Yes Clinical Summary of Care: Electronic Signature(s) Signed: 05/26/2018 11:40:37 AM By: Montey Hora Entered By: Montey Hora on 05/26/2018 11:40:36 Jenna [name] ([id number]) -------------------------------------------------------------------------------- Lower Extremity Assessment Details Patient Name: Jenna [name] Date of Service: 05/26/2018 8:00 AM Medical Record Number: [id number] Patient Account Number: [id number] Date of Birth/Sex: [birth year] (84 y.o. F) Treating RN: Roger Shelter Primary Care Veeda Virgo: Lujean Amel Other Clinician: Referring Delilah Mulgrew: Lujean Amel Treating Gola Bribiesca/Extender: Tito Dine in Treatment: 0 Edema Assessment Assessed: [Left: No] [Right: No] Edema: [Left: Yes] [Right: Yes] Calf Left: Right: Point of Measurement: 33 cm From Medial Instep 45 cm 43 cm Ankle Left: Right: Point of Measurement: 11 cm From Medial Instep 26.5 cm 26.5 cm Vascular Assessment Claudication: Claudication Assessment [Left:None] [Right:None] Pulses: Dorsalis Pedis Palpable: [Left:Yes] [Right:Yes] Posterior Tibial Extremity colors, hair growth, and conditions: Extremity Color: [Left:Hyperpigmented] [Right:Normal] Hair Growth on Extremity: [Left:No]  [Right:No] Temperature of Extremity: [Left:Warm] [Right:Cool] Capillary Refill: [Left:< 3 seconds] [Right:< 3 seconds] Blood Pressure: Brachial: [Left:160] [Right:152] Dorsalis Pedis: 162 [Left:Dorsalis Pedis: 148] Ankle: Posterior Tibial: 168 [Left:Posterior Tibial: 152 1.05] [Right:0.95] Toe Nail Assessment Left: Right: Thick: No No Discolored: No No Deformed: No No Improper Length and Hygiene: No No Electronic Signature(s) Signed: 05/26/2018 4:15:53 PM By: Roger Shelter Entered By: Roger Shelter on 05/26/2018 08:47:38 Fear, [name] ([id number]) -------------------------------------------------------------------------------- Multi Wound Chart Details Patient Name: Jenna [name] Date of Service: 05/26/2018 8:00 AM Medical Record Number: [id number] Patient Account Number: [id number] Date of Birth/Sex: 06-Dec-1931 (84 y.o. F) Treating RN: Cornell Barman Primary Care Adonnis Salceda: Lujean Amel Other Clinician: Referring Kemani Demarais: Lujean Amel Treating Iantha Titsworth/Extender: Tito Dine in Treatment: 0 Vital Signs Height(in): 60 Pulse(bpm): 70 Weight(lbs): 232 Blood Pressure(mmHg): Body Mass Index(BMI): 45 Temperature(F): 97.9  0 Patient Transfer (multiple staff / Civil Service fast streamer / Similar devices) []  - 0 Simple Staple / Suture removal (25 or less) []  - 0 Complex Staple / Suture removal (26 or more) []  - 0 Hypo/Hyperglycemic Management (do not check if billed separately) X- 1 15 Ankle / Brachial Index (ABI) - do not check if billed separately Has the patient been seen at the hospital within the last three years: Yes Total Score: 100 Level Of Care: New/Established - Level 3 Electronic Signature(s) Signed: 05/26/2018 5:21:30 PM By: Gretta Cool, [name], RN, CWS, [name], [name] Entered By: Gretta Cool, [name], RN, CWS, Kim on 05/26/2018 09:00:47 Jenna [name] ([id number]) -------------------------------------------------------------------------------- Compression Therapy Details Patient Name: Jenna [name] Date of Service: 05/26/2018 8:00 AM Medical Record Number: [id number] Patient Account Number: [id number] Date of Birth/Sex: 1931/01/05 (84 y.o. F) Treating RN: Cornell Barman Primary Care Nallely Yost: Lujean Amel Other Clinician: Referring Yvette Roark: Lujean Amel Treating Marizol Borror/Extender: Tito Dine in Treatment: 0 Compression Therapy Performed for Wound Assessment: Wound #2 Left,Medial Lower Leg Performed By: Clinician Cornell Barman, RN Compression Type: Three Layer Pre Treatment ABI: 1 Post Procedure Diagnosis Same as Pre-procedure Electronic Signature(s) Signed: 05/26/2018 5:21:30 PM By: Gretta Cool, [name], RN, CWS, [name], [name] Entered By: Gretta Cool, [name], RN, CWS, Kim on 05/26/2018 08:59:42 Jenna [name]  ([id number]) -------------------------------------------------------------------------------- Encounter Discharge Information Details Patient Name: Jenna [name], Jenna [name]. Date of Service: 05/26/2018 8:00 AM Medical Record Number: [id number] Patient Account Number: [id number] Date of Birth/Sex: 04-14-1931 (84 y.o. F) Treating RN: Montey Hora Primary Care Jakobe Blau: Lujean Amel Other Clinician: Referring Solimar Maiden: Lujean Amel Treating Kavion Mancinas/Extender: Tito Dine in Treatment: 0 Encounter Discharge Information Items Discharge Condition: Stable Ambulatory Status: Cane Discharge Destination: Home Transportation: Private Auto Accompanied By: self Schedule Follow-up Appointment: Yes Clinical Summary of Care: Electronic Signature(s) Signed: 05/26/2018 11:40:37 AM By: Montey Hora Entered By: Montey Hora on 05/26/2018 11:40:36 Jenna [name] ([id number]) -------------------------------------------------------------------------------- Lower Extremity Assessment Details Patient Name: Jenna [name] Date of Service: 05/26/2018 8:00 AM Medical Record Number: [id number] Patient Account Number: [id number] Date of Birth/Sex: [birth year] (84 y.o. F) Treating RN: Roger Shelter Primary Care Veeda Virgo: Lujean Amel Other Clinician: Referring Delilah Mulgrew: Lujean Amel Treating Gola Bribiesca/Extender: Tito Dine in Treatment: 0 Edema Assessment Assessed: [Left: No] [Right: No] Edema: [Left: Yes] [Right: Yes] Calf Left: Right: Point of Measurement: 33 cm From Medial Instep 45 cm 43 cm Ankle Left: Right: Point of Measurement: 11 cm From Medial Instep 26.5 cm 26.5 cm Vascular Assessment Claudication: Claudication Assessment [Left:None] [Right:None] Pulses: Dorsalis Pedis Palpable: [Left:Yes] [Right:Yes] Posterior Tibial Extremity colors, hair growth, and conditions: Extremity Color: [Left:Hyperpigmented] [Right:Normal] Hair Growth on Extremity: [Left:No]  [Right:No] Temperature of Extremity: [Left:Warm] [Right:Cool] Capillary Refill: [Left:< 3 seconds] [Right:< 3 seconds] Blood Pressure: Brachial: [Left:160] [Right:152] Dorsalis Pedis: 162 [Left:Dorsalis Pedis: 148] Ankle: Posterior Tibial: 168 [Left:Posterior Tibial: 152 1.05] [Right:0.95] Toe Nail Assessment Left: Right: Thick: No No Discolored: No No Deformed: No No Improper Length and Hygiene: No No Electronic Signature(s) Signed: 05/26/2018 4:15:53 PM By: Roger Shelter Entered By: Roger Shelter on 05/26/2018 08:47:38 Fear, [name] ([id number]) -------------------------------------------------------------------------------- Multi Wound Chart Details Patient Name: Jenna [name] Date of Service: 05/26/2018 8:00 AM Medical Record Number: [id number] Patient Account Number: [id number] Date of Birth/Sex: 06-Dec-1931 (84 y.o. F) Treating RN: Cornell Barman Primary Care Adonnis Salceda: Lujean Amel Other Clinician: Referring Kemani Demarais: Lujean Amel Treating Iantha Titsworth/Extender: Tito Dine in Treatment: 0 Vital Signs Height(in): 60 Pulse(bpm): 70 Weight(lbs): 232 Blood Pressure(mmHg): Body Mass Index(BMI): 45 Temperature(F): 97.9

## 2018-05-28 NOTE — Progress Notes (Signed)
ACADIA, THAMMAVONG ([id number]) Visit Report for 05/26/2018 Chief Complaint Document Details Patient Name: Jenna [name], Jenna [name]. Date of Service: 05/26/2018 8:00 AM Medical Record Number: [id number] Patient Account Number: [id number] Date of Birth/Sex: 1931/03/14 (84 y.o. F) Treating RN: Cornell Barman Primary Care Provider: Lujean Amel Other Clinician: Referring Provider: Lujean Amel Treating Provider/Extender: Tito Dine in Treatment: 0 Information Obtained from: Patient Chief Complaint Patient presents for treatment of an open ulcer due to venous insufficiency the left lower extremity she's had for several months now 05/26/18; patient presents for opening and drainage of the left medial ankle which is the same place where she had a wound previously in this clinic. Electronic Signature(s) Signed: 05/27/2018 8:24:56 AM By: Linton Ham MD Entered By: Linton Ham on 05/26/2018 09:10:36 Adkins, [name] ([id number]) -------------------------------------------------------------------------------- HPI Details Patient Name: Jenna [name] Date of Service: 05/26/2018 8:00 AM Medical Record Number: [id number] Patient Account Number: [id number] Date of Birth/Sex: June 11, 1931 (84 y.o. F) Treating RN: Cornell Barman Primary Care Provider: Lujean Amel Other Clinician: Referring Provider: Lujean Amel Treating Provider/Extender: Tito Dine in Treatment: 0 History of Present Illness Location: left lower extremity in the medial ankle region Quality: Patient reports experiencing a dull pain to affected area(s). Severity: Patient states wound are getting worse. Duration: Patient has had the wound for > 3 months prior to seeking treatment at the wound center Timing: Pain in wound is Intermittent (comes and goes Context: The wound appeared gradually over time Modifying Factors: Other treatment(s) tried include:[name] to hospital for IV antibiotics for  cellulitis Associated Signs and Symptoms: Patient reports having increase swelling. HPI Description: 84 year old patient with a past medical history significant for venous stasis ulceration and diabetes mellitus was recently admitted to the hospital between 07/07/2016 and 07/09/2016. She was wound to have a nonpurulent cellulitis of the left lower extremity and was started on IV antibiotics which included vancomycin. He is discharged home with her and Unna's boots and oral doxycycline. During this admission there was no obvious DVT involving the left lower extremity. her past medical history significant for diabetes mellitus, hypertension, hyperlipidemia, varicose veins, status post right rotator cuff repair, knee surgery on the left,robotic abdominal hysterectomy, laparoscopic cholecystectomy. She is not a smoker. Venous study done on 07/18/2015 showed normal left extremity deep venous system with no evidence of DVT. There was extensive valvular incompetence and reflux throughout the left greater saphenous vein with direct the medication to subcutaneous varicose veins. Normal left small saphenous vein. I understand she was seen by vascular radiology group and the workup and recommendation was for endovenous ablation but due to family pressure she was not able to keep her appointment a year ago. She has not been wearing her compression stockings regularly. 05/26/18 READMISSION this is an 84 year old woman who was previously seen in this clinic in 2017 by Dr. Con Memos. She has chronic venous insufficiency with lymphedema and at that time had open area on the left medial calf and ankle area. Was recommended that she wear 20- 30 mm compression stockings and the patient tells me that she has been compliant with this although I think her primary doctor recently told her that she didn't need to wear stockings out of fear it might cause arterial compression. She noticed edema and pain in the area a week to  2 ago. She saw her primary doctor and was given doxycycline and Silvadene cream to put on the area. She states things are a lot better. The patient has a history  of type 2 diabetes on oral agents but she is unaware of her hemoglobin A1c for her blood glucose which she doesn't check. She has hypertension, varicose veins, rotator cuff repair, knee surgery on the left, history of a laparoscopic cholecystectomy, lymphedema, obstructive sleep apnea, history of endometrial CA. The patient has been to see vein and vascular in the past and I think has had an ablation of the left greater saphenous vein based on ultrasounds of the left leg IC dating back to 2017. Her most recent ultrasound was in May 2018 which showed no evidence of a DVT and continued durable closure of the treated segment of the left greater saphenous vein. Patent lateral accessory branch of the greater saphenous vein extending to the calf ABIs in our clinic were 1.05 on the right and 0.95 on the left Electronic Signature(s) Signed: 05/27/2018 8:24:56 AM By: Linton Ham MD Entered By: Linton Ham on 05/26/2018 09:14:59 Menta, [name] ([id number]) LABRISHA, WUELLNER ([id number]) -------------------------------------------------------------------------------- Physical Exam Details Patient Name: Jenna, [name]. Date of Service: 05/26/2018 8:00 AM Medical Record Number: [id number] Patient Account Number: [id number] Date of Birth/Sex: [birth year] (84 y.o. F) Treating RN: Cornell Barman Primary Care Provider: Lujean Amel Other Clinician: Referring Provider: Lujean Amel Treating Provider/Extender: Ricard Dillon Weeks in Treatment: 0 Constitutional Pulse regular and within target range for patient.Marland Kitchen Respirations regular, non-labored and within target range.. Temperature is normal and within the target range for the patient.. the patient appears well. Looks much younger than her stated age.. Eyes Conjunctivae clear. No  discharge. Respiratory Respiratory effort is easy and symmetric bilaterally. Rate is normal at rest and on room air.. Cardiovascular Heart rhythm and rate regular, without murmur or gallop.. Pedal pulses palpable and strong bilaterally.. Edema present in both extremities.this is mild to moderate. Nonpitting. Lymphatic none palpable in the popliteal or inguinal area. Musculoskeletal she has pes planus deformity of her feet and what looks to be possible Charcot deformities of both ankles.. Neurological markedly reduced vibration sense in both feet. Psychiatric No evidence of depression, anxiety, or agitation. Calm, cooperative, and communicative. Appropriate interactions and affect.. Notes wound exam; the patient's only open area is a small superficial area of the left lower calf just above the medial malleolus on the left. There is some eschar here in some drainage. Skin looks vulnerable but it is very superficial. The surrounding skin here has scar tissue also hemosiderin deposition and probably some degree of chronic venous inflammation. There is no tenderness and no current evidence of infection Electronic Signature(s) Signed: 05/27/2018 8:24:56 AM By: Linton Ham MD Entered By: Linton Ham on 05/26/2018 09:17:22 Jenna [name] ([id number]) -------------------------------------------------------------------------------- Physician Orders Details Patient Name: Jenna [name] Date of Service: 05/26/2018 8:00 AM Medical Record Number: [id number] Patient Account Number: [id number] Date of Birth/Sex: Apr 12, 1931 (84 y.o. F) Treating RN: Cornell Barman Primary Care Provider: Lujean Amel Other Clinician: Referring Provider: Lujean Amel Treating Provider/Extender: Tito Dine in Treatment: 0 Verbal / Phone Orders: No Diagnosis Coding Wound Cleansing Wound #2 Left,Medial Lower Leg o Clean wound with Normal Saline. Anesthetic (add to Medication List) Wound #2  Left,Medial Lower Leg o Topical Lidocaine 4% cream applied to wound bed prior to debridement (In Clinic Only). Primary Wound Dressing Wound #2 Left,Medial Lower Leg o Silver Alginate Secondary Dressing Wound #2 Left,Medial Lower Leg o ABD pad Dressing Change Frequency Wound #2 Left,Medial Lower Leg o Change dressing every week o Other: - as needed Follow-up Appointments Wound #2 Left,Medial Lower Leg o Return  Appointment in 1 week. Edema Control Wound #2 Left,Medial Lower Leg o 3 Layer Compression System - Right Lower Extremity Additional Orders / Instructions Wound #2 Left,Medial Lower Leg o Increase protein intake. Electronic Signature(s) Signed: 05/26/2018 5:21:30 PM By: Gretta Cool, [name], RN, CWS, [name], [name] Signed: 05/27/2018 8:24:56 AM By: Linton Ham MD Entered By: Gretta Cool, [name], RN, CWS, Kim on 05/26/2018 08:58:54 EMMERSEN, GARRAWAY ([id number]) -------------------------------------------------------------------------------- Problem List Details Patient Name: UNICE, VANTASSEL. Date of Service: 05/26/2018 8:00 AM Medical Record Number: [id number] Patient Account Number: [id number] Date of Birth/Sex: 1931/02/25 (84 y.o. F) Treating RN: Cornell Barman Primary Care Provider: Lujean Amel Other Clinician: Referring Provider: Lujean Amel Treating Provider/Extender: Tito Dine in Treatment: 0 Active Problems ICD-10 Impacting Encounter Code Description Active Date Wound Healing Diagnosis L97.221 Non-pressure chronic ulcer of left calf limited to breakdown of 05/26/2018 Yes skin I87.332 Chronic venous hypertension (idiopathic) with ulcer and 05/26/2018 Yes inflammation of left lower extremity I89.0 Lymphedema, not elsewhere classified 05/26/2018 Yes Inactive Problems Resolved Problems Electronic Signature(s) Signed: 05/27/2018 8:24:56 AM By: Linton Ham MD Entered By: Linton Ham on 05/26/2018 09:09:52 Jenna [name]  ([id number]) -------------------------------------------------------------------------------- Progress Note Details Patient Name: Jenna [name] Date of Service: 05/26/2018 8:00 AM Medical Record Number: [id number] Patient Account Number: [id number] Date of Birth/Sex: [birth year] (84 y.o. F) Treating RN: Cornell Barman Primary Care Provider: Lujean Amel Other Clinician: Referring Provider: Lujean Amel Treating Provider/Extender: Tito Dine in Treatment: 0 Subjective Chief Complaint Information obtained from Patient Patient presents for treatment of an open ulcer due to venous insufficiency the left lower extremity she's had for several months now 05/26/18; patient presents for opening and drainage of the left medial ankle which is the same place where she had a wound previously in this clinic. History of Present Illness (HPI) The following HPI elements were documented for the patient's wound: Location: left lower extremity in the medial ankle region Quality: Patient reports experiencing a dull pain to affected area(s). Severity: Patient states wound are getting worse. Duration: Patient has had the wound for > 3 months prior to seeking treatment at the wound center Timing: Pain in wound is Intermittent (comes and goes Context: The wound appeared gradually over time Modifying Factors: Other treatment(s) tried include:[name] Associated Signs and Symptoms: Patient reports having increase swelling. 84 year old patient with a past medical history significant for venous stasis ulceration and diabetes mellitus was recently admitted to the hospital between 07/07/2016 and 07/09/2016. She was wound to have a nonpurulent cellulitis of the left lower extremity and was started on IV antibiotics which included vancomycin. He is discharged home with her and Unna's boots and oral doxycycline. During this admission there was no obvious  DVT involving the left lower extremity. her past medical history significant for diabetes mellitus, hypertension, hyperlipidemia, varicose veins, status post right rotator cuff repair, knee surgery on the left,robotic abdominal hysterectomy, laparoscopic cholecystectomy. She is not a smoker. Venous study done on 07/18/2015 showed normal left extremity deep venous system with no evidence of DVT. There was extensive valvular incompetence and reflux throughout the left greater saphenous vein with direct the medication to subcutaneous varicose veins. Normal left small saphenous vein. I understand she was seen by vascular radiology group and the workup and recommendation was for endovenous ablation but due to family pressure she was not able to keep her appointment a year ago. She has not been wearing her compression stockings regularly. 05/26/18 READMISSION this is an  84 year old woman who was previously seen in this clinic in 2017 by Dr. Con Memos. She has chronic venous insufficiency with lymphedema and at that time had open area on the left medial calf and ankle area. Was recommended that she wear 20- 30 mm compression stockings and the patient tells me that she has been compliant with this although I think her primary doctor recently told her that she didn't need to wear stockings out of fear it might cause arterial compression. She noticed edema and pain in the area a week to 2 ago. She saw her primary doctor and was given doxycycline and Silvadene cream to put on the area. She states things are a lot better. The patient has a history of type 2 diabetes on oral agents but she is unaware of her hemoglobin A1c for her blood glucose which she doesn't check. She has hypertension, varicose veins, rotator cuff repair, knee surgery on the left, history of a laparoscopic cholecystectomy, lymphedema, obstructive sleep apnea, history of endometrial CA. The patient has been to see vein and vascular in the past  and I think has had an ablation of the left greater saphenous vein based on ultrasounds of the left leg IC dating back to 2017. Her most recent ultrasound was in May 2018 which showed no PEGGI, YONO. ([id number]) evidence of a DVT and continued durable closure of the treated segment of the left greater saphenous vein. Patent lateral accessory branch of the greater saphenous vein extending to the calf ABIs in our clinic were 1.05 on the right and 0.95 on the left Wound History Patient presents with 1 open wound that has been present for approximately 03-29-2018. Patient has been treating wound in the following manner: silvaden cream. Laboratory tests have not been performed in the last month. Patient reportedly has not tested positive for an antibiotic resistant organism. Patient reportedly has not tested positive for osteomyelitis. Patient reportedly has had testing performed to evaluate circulation in the legs. Patient experiences the following problems associated with their wounds: infection. Patient History Information obtained from Patient. Allergies Shellfish (Severity: Severe, Reaction: anaphlaxsis), Ancef (Severity: Severe, Reaction: nausea and vomiting) Family History Cancer - Siblings, Diabetes - Siblings, Hypertension - Maternal Grandparents,Paternal Grandparents,Mother,Father,Siblings, Lung Disease - Siblings, No family history of Heart Disease, Hereditary Spherocytosis, Kidney Disease, Seizures, Stroke, Thyroid Problems, Tuberculosis. Social History Never smoker, Marital Status - Married, Alcohol Use - Never, Drug Use - No History, Caffeine Use - Daily. Medical History Ear/Nose/Mouth/Throat Denies history of Chronic sinus problems/congestion, Middle ear problems Hematologic/Lymphatic Patient has history of Lymphedema Denies history of Anemia, Hemophilia, Human Immunodeficiency Virus, Sickle Cell Disease Respiratory Denies history of Aspiration, Asthma, Chronic Obstructive  Pulmonary Disease (COPD), Pneumothorax, Sleep Apnea, Tuberculosis Cardiovascular Denies history of Angina, Arrhythmia, Congestive Heart Failure, Coronary Artery Disease, Deep Vein Thrombosis, Hypotension, Myocardial Infarction, Peripheral Arterial Disease, Peripheral Venous Disease, Phlebitis, Vasculitis Gastrointestinal Denies history of Cirrhosis , Colitis, Crohn s, Hepatitis A, Hepatitis B, Hepatitis C Genitourinary Denies history of End Stage Renal Disease Immunological Denies history of Lupus Erythematosus, Raynaud s, Scleroderma Integumentary (Skin) Denies history of History of Burn, History of pressure wounds Musculoskeletal Denies history of Gout, Rheumatoid Arthritis, Osteomyelitis Neurologic Denies history of Dementia, Neuropathy, Quadriplegia, Paraplegia, Seizure Disorder Psychiatric Denies history of Anorexia/bulimia, Confinement Anxiety Review of Systems (ROS) Eyes Complains or has symptoms of Dry Eyes, Glasses / Contacts - glasses. Denies complaints or symptoms of Vision Changes. PEGGI, YONO ([id number]) Ear/Nose/Mouth/Throat Denies complaints or symptoms of Difficult clearing ears, migraine headaches Hematologic/Lymphatic  Denies complaints or symptoms of Bleeding / Clotting Disorders, Human Immunodeficiency Virus. Respiratory Denies complaints or symptoms of Chronic or frequent coughs, Shortness of Breath. Cardiovascular Complains or has symptoms of LE edema. Denies complaints or symptoms of Chest pain. Gastrointestinal Denies complaints or symptoms of Frequent diarrhea, Nausea. Endocrine Denies complaints or symptoms of Hepatitis, Thyroid disease, Polydypsia (Excessive Thirst). Genitourinary Denies complaints or symptoms of Kidney failure/ Dialysis, Incontinence/dribbling. Immunological Denies complaints or symptoms of Hives, Itching. Integumentary (Skin) Complains or has symptoms of Wounds, Swelling. Denies complaints or symptoms of Bleeding or  bruising tendency, Breakdown. Musculoskeletal Denies complaints or symptoms of Muscle Pain, Muscle Weakness. Neurologic Denies complaints or symptoms of Numbness/parasthesias, Focal/Weakness. Oncologic The patient has no complaints or symptoms. Psychiatric Denies complaints or symptoms of Anxiety, Claustrophobia. Objective Constitutional Pulse regular and within target range for patient.Marland Kitchen Respirations regular, non-labored and within target range.. Temperature is normal and within the target range for the patient.. the patient appears well. Looks much younger than her stated age.. Vitals Time Taken: 8:13 AM, Height: 60 in, Source: Stated, Weight: 232 lbs, Source: Measured, BMI: 45.3, Temperature: 97.9 F, Pulse: 70 bpm, Respiratory Rate: 18 breaths/min. Eyes Conjunctivae clear. No discharge. Respiratory Respiratory effort is easy and symmetric bilaterally. Rate is normal at rest and on room air.. Cardiovascular Heart rhythm and rate regular, without murmur or gallop.. Pedal pulses palpable and strong bilaterally.. Edema present in both extremities.this is mild to moderate. Nonpitting. Lymphatic none palpable in the popliteal or inguinal area. WILHELMINA, HARK ([id number]) Musculoskeletal she has pes planus deformity of her feet and what looks to be possible Charcot deformities of both ankles.. Neurological markedly reduced vibration sense in both feet. Psychiatric No evidence of depression, anxiety, or agitation. Calm, cooperative, and communicative. Appropriate interactions and affect.. General Notes: wound exam; the patient's only open area is a small superficial area of the left lower calf just above the medial malleolus on the left. There is some eschar here in some drainage. Skin looks vulnerable but it is very superficial. The surrounding skin here has scar tissue also hemosiderin deposition and probably some degree of chronic venous inflammation. There is no tenderness and  no current evidence of infection Integumentary (Hair, Skin) Wound #2 status is Open. Original cause of wound was Gradually Appeared. The wound is located on the Left,Medial Lower Leg. The wound measures 0.6cm length x 2cm width x 0.1cm depth; 0.942cm^2 area and 0.094cm^3 volume. There is Fat Layer (Subcutaneous Tissue) Exposed exposed. There is no tunneling or undermining noted. There is a medium amount of serous drainage noted. The wound margin is flat and intact. There is medium (34-66%) red, pink granulation within the wound bed. There is a medium (34-66%) amount of necrotic tissue within the wound bed including Eschar. The periwound skin appearance exhibited: Scarring. The periwound skin appearance did not exhibit: Callus, Crepitus, Excoriation, Induration, Rash, Dry/Scaly, Maceration, Atrophie Blanche, Cyanosis, Ecchymosis, Hemosiderin Staining, Mottled, Pallor, Rubor, Erythema. Periwound temperature was noted as No Abnormality. Assessment Active Problems ICD-10 L97.221 - Non-pressure chronic ulcer of left calf limited to breakdown of skin I87.332 - Chronic venous hypertension (idiopathic) with ulcer and inflammation of left lower extremity I89.0 - Lymphedema, not elsewhere classified Procedures Wound #2 Pre-procedure diagnosis of Wound #2 is a Lymphedema located on the Left,Medial Lower Leg . There was a Three Layer Compression Therapy Procedure with a pre-treatment ABI of 1 by Cornell Barman, RN. Post procedure Diagnosis Wound #2: Same as Pre-Procedure Plan Wound Cleansing: RAKIYAH, ESCH ([id number]) Wound #2  Left,Medial Lower Leg: Clean wound with Normal Saline. Anesthetic (add to Medication List): Wound #2 Left,Medial Lower Leg: Topical Lidocaine 4% cream applied to wound bed prior to debridement (In Clinic Only). Primary Wound Dressing: Wound #2 Left,Medial Lower Leg: Silver Alginate Secondary Dressing: Wound #2 Left,Medial Lower Leg: ABD pad Dressing Change  Frequency: Wound #2 Left,Medial Lower Leg: Change dressing every week Other: - as needed Follow-up Appointments: Wound #2 Left,Medial Lower Leg: Return Appointment in 1 week. Edema Control: Wound #2 Left,Medial Lower Leg: 3 Layer Compression System - Right Lower Extremity Additional Orders / Instructions: Wound #2 Left,Medial Lower Leg: Increase protein intake. #1 I think this patient has chronic severe venous insufficiency was secondary inflammation/stasis dermatitis #2 a very superficial wound currently. In order to ensure this does not progress I'm going to put her alginate on the wound and put her in 3 layer compression all week #3 the patient has been compliant with 20-30 mm below-knee stockings however the stocking she had by her description were 84 years old that she had obtained from elastic therapy in [facility]. We'll see how she does with a new pair of 20-30 mm stockings that she'll bring with her next week. She mainly require more compression or perhaps wraparound stockings. #4 the patient has had a previous ablation of the left greater saphenous vein. In my mind the areas not refractory at least not yet. #5 the patient has been told not to wear compression stockings out of fear of arterial insufficiency however based on clinical examination B eyes done in our clinic I don't think she has any arterial insufficiency. She is certainly able to tolerate 20-30 mm stockings. As noted she might require more compression #6 the patient probably has diabetic neuropathy and I wonder whether she is developing Charcot deformities in her ankles. She is 84 years old and reasonably asymptomatic however it might be worthwhile making sure that her diabetes is under the best possible control Electronic Signature(s) Signed: 05/27/2018 8:24:56 AM By: Linton Ham MD Entered By: Linton Ham on 05/26/2018 09:20:40 Jenna [name]  ([id number]) -------------------------------------------------------------------------------- ROS/PFSH Details Patient Name: Jenna [name] Date of Service: 05/26/2018 8:00 AM Medical Record Number: [id number] Patient Account Number: [id number] Date of Birth/Sex: [birth year] (84 y.o. F) Treating RN: Roger Shelter Primary Care Provider: Lujean Amel Other Clinician: Referring Provider: Lujean Amel Treating Provider/Extender: Tito Dine in Treatment: 0 Information Obtained From Patient Wound History Do you currently have one or more open woundso Yes How many open wounds do you currently haveo 1 Approximately how long have you had your woundso 03-29-2018 How have you been treating your wound(s) until nowo silvaden cream Has your wound(s) ever healed and then re-openedo No Have you had any lab work done in the past montho No Have you tested positive for an antibiotic resistant organism (MRSA, VRE)o No Have you tested positive for osteomyelitis (bone infection)o No Have you had any tests for circulation on your legso Yes Who ordered the testo 2017 Where was the test doneo 2017 Have you had other problems associated with your woundso Infection Eyes Complaints and Symptoms: Positive for: Dry Eyes; Glasses / Contacts - glasses Negative for: Vision Changes Medical History: Positive for: Cataracts - surgery; Glaucoma Ear/Nose/Mouth/Throat Complaints and Symptoms: Negative for: Difficult clearing ears Review of System Notes: migraine headaches Medical History: Negative for: Chronic sinus problems/congestion; Middle ear problems Hematologic/Lymphatic Complaints and Symptoms: Negative for: Bleeding / Clotting Disorders; Human Immunodeficiency Virus Medical History: Positive for: Lymphedema Negative for:  Anemia; Hemophilia; Human Immunodeficiency Virus; Sickle Cell Disease Respiratory Complaints and Symptoms: Negative for: Chronic or frequent coughs; Shortness of  Breath ELINE, GENG ([id number]) Medical History: Negative for: Aspiration; Asthma; Chronic Obstructive Pulmonary Disease (COPD); Pneumothorax; Sleep Apnea; Tuberculosis Cardiovascular Complaints and Symptoms: Positive for: LE edema Negative for: Chest pain Medical History: Positive for: Hypertension Negative for: Angina; Arrhythmia; Congestive Heart Failure; Coronary Artery Disease; Deep Vein Thrombosis; Hypotension; Myocardial Infarction; Peripheral Arterial Disease; Peripheral Venous Disease; Phlebitis; Vasculitis Gastrointestinal Complaints and Symptoms: Negative for: Frequent diarrhea; Nausea Medical History: Negative for: Cirrhosis ; Colitis; Crohnos; Hepatitis A; Hepatitis B; Hepatitis C Endocrine Complaints and Symptoms: Negative for: Hepatitis; Thyroid disease; Polydypsia (Excessive Thirst) Medical History: Positive for: Type II Diabetes Time with diabetes: 15 years Treated with: Oral agents Blood sugar tested every day: Yes Tested : rarely Genitourinary Complaints and Symptoms: Negative for: Kidney failure/ Dialysis; Incontinence/dribbling Medical History: Negative for: End Stage Renal Disease Immunological Complaints and Symptoms: Negative for: Hives; Itching Medical History: Negative for: Lupus Erythematosus; Raynaudos; Scleroderma Integumentary (Skin) Complaints and Symptoms: Positive for: Wounds; Swelling Negative for: Bleeding or bruising tendency; Breakdown Medical History: Negative for: History of Burn; History of pressure wounds KENNEDY, BRINES ([id number]) Musculoskeletal Complaints and Symptoms: Negative for: Muscle Pain; Muscle Weakness Medical History: Positive for: Osteoarthritis Negative for: Gout; Rheumatoid Arthritis; Osteomyelitis Neurologic Complaints and Symptoms: Negative for: Numbness/parasthesias; Focal/Weakness Medical History: Negative for: Dementia; Neuropathy; Quadriplegia; Paraplegia; Seizure  Disorder Psychiatric Complaints and Symptoms: Negative for: Anxiety; Claustrophobia Medical History: Negative for: Anorexia/bulimia; Confinement Anxiety Oncologic Complaints and Symptoms: No Complaints or Symptoms Medical History: Negative for: Received Chemotherapy; Received Radiation HBO Extended History Items Eyes: Eyes: Cataracts Glaucoma Immunizations Pneumococcal Vaccine: Received Pneumococcal Vaccination: No Implantable Devices Family and Social History Cancer: Yes - Siblings; Diabetes: Yes - Siblings; Heart Disease: No; Hereditary Spherocytosis: No; Hypertension: Yes - Maternal Grandparents,Paternal Grandparents,Mother,Father,Siblings; Kidney Disease: No; Lung Disease: Yes - Siblings; Seizures: No; Stroke: No; Thyroid Problems: No; Tuberculosis: No; Never smoker; Marital Status - Married; Alcohol Use: Never; Drug Use: No History; Caffeine Use: Daily; Financial Concerns: No; Food, Clothing or Shelter Needs: No; Support System Lacking: No; Transportation Concerns: No; Advanced Directives: No; Patient does not want information on Advanced Directives; Do not resuscitate: No; Living Will: No; Medical Power of Attorney: No Electronic Signature(s) Signed: 05/26/2018 4:15:53 PM By: Roger Shelter Signed: 05/27/2018 8:24:56 AM By: Linton Ham MD Entered By: Roger Shelter on 05/26/2018 08:21:25 BIDDIE, SEBEK ([id number]) -------------------------------------------------------------------------------- [place] Details Patient Name: Jenna [name] Date of Service: 05/26/2018 Medical Record Number: [id number] Patient Account Number: [id number] Date of Birth/Sex: Dec 20, 1931 (84 y.o. F) Treating RN: Cornell Barman Primary Care Provider: Lujean Amel Other Clinician: Referring Provider: Lujean Amel Treating Provider/Extender: Tito Dine in Treatment: 0 Diagnosis Coding ICD-10 Codes Code Description [phone] Non-pressure chronic ulcer of left calf limited  to breakdown of skin I87.332 Chronic venous hypertension (idiopathic) with ulcer and inflammation of left lower extremity I89.0 Lymphedema, not elsewhere classified Facility Procedures CPT4 Code: [id number] Description: 99213 - WOUND CARE VISIT-LEV 3 EST PT Modifier: Quantity: 1 CPT4 Code: 26948546 Description: (Facility Use Only) 29581LT - APPLY MULTLAY COMPRS LWR LT LEG Modifier: Quantity: 1 Physician Procedures CPT4: Description Modifier Quantity Code [id number] 93818 - WC PHYS LEVEL 4 - EST PT 1 ICD-10 Diagnosis Description L97.221 Non-pressure chronic ulcer of left calf limited to breakdown of skin I87.332 Chronic venous hypertension (idiopathic) with ulcer and  inflammation of left lower extremity I89.0 Lymphedema, not elsewhere classified Electronic Signature(s) Signed: 05/27/2018 8:24:56 AM  By: Linton Ham MD Previous Signature: 05/26/2018 9:14:14 AM Version By: Gretta Cool [name], RN, CWS, [name], [name] Entered By: Linton Ham on 05/26/2018 09:21:13

## 2018-06-02 ENCOUNTER — Encounter: Payer: Medicare Other | Attending: Internal Medicine | Admitting: Internal Medicine

## 2018-06-02 DIAGNOSIS — L97221 Non-pressure chronic ulcer of left calf limited to breakdown of skin: Secondary | ICD-10-CM | POA: Insufficient documentation

## 2018-06-02 DIAGNOSIS — I878 Other specified disorders of veins: Secondary | ICD-10-CM | POA: Diagnosis not present

## 2018-06-02 DIAGNOSIS — E785 Hyperlipidemia, unspecified: Secondary | ICD-10-CM | POA: Diagnosis not present

## 2018-06-02 DIAGNOSIS — G4733 Obstructive sleep apnea (adult) (pediatric): Secondary | ICD-10-CM | POA: Insufficient documentation

## 2018-06-02 DIAGNOSIS — E1151 Type 2 diabetes mellitus with diabetic peripheral angiopathy without gangrene: Secondary | ICD-10-CM | POA: Diagnosis not present

## 2018-06-02 DIAGNOSIS — Z9049 Acquired absence of other specified parts of digestive tract: Secondary | ICD-10-CM | POA: Insufficient documentation

## 2018-06-02 DIAGNOSIS — L97229 Non-pressure chronic ulcer of left calf with unspecified severity: Secondary | ICD-10-CM | POA: Diagnosis not present

## 2018-06-02 DIAGNOSIS — I10 Essential (primary) hypertension: Secondary | ICD-10-CM | POA: Diagnosis not present

## 2018-06-02 DIAGNOSIS — I890 Lymphedema, not elsewhere classified: Secondary | ICD-10-CM | POA: Diagnosis not present

## 2018-06-02 DIAGNOSIS — E11622 Type 2 diabetes mellitus with other skin ulcer: Secondary | ICD-10-CM | POA: Diagnosis present

## 2018-06-02 DIAGNOSIS — Z8542 Personal history of malignant neoplasm of other parts of uterus: Secondary | ICD-10-CM | POA: Diagnosis not present

## 2018-06-04 NOTE — Progress Notes (Signed)
HARRISON, PAULSON ([id number]) Visit Report for 06/02/2018 HPI Details Patient Name: Jenna [name], Jenna [name]. Date of Service: 06/02/2018 10:15 AM Medical Record Number: [id number] Patient Account Number: [id number] Date of Birth/Sex: 1931/11/14 (84 y.o. F) Treating RN: Cornell Barman Primary Care Provider: Lujean Amel Other Clinician: Referring Provider: Lujean Amel Treating Provider/Extender: Tito Dine in Treatment: 1 History of Present Illness Location: left lower extremity in the medial ankle region Quality: Patient reports experiencing a dull pain to affected area(s). Severity: Patient states wound are getting worse. Duration: Patient has had the wound for > 3 months prior to seeking treatment at the wound center Timing: Pain in wound is Intermittent (comes and goes Context: The wound appeared gradually over time Modifying Factors: Other treatment(s) tried include:[name] Associated Signs and Symptoms: Patient reports having increase swelling. HPI Description: 84 year old patient with a past medical history significant for venous stasis ulceration and diabetes mellitus was recently admitted to the hospital between 07/07/2016 and 07/09/2016. She was wound to have a nonpurulent cellulitis of the left lower extremity and was started on IV antibiotics which included vancomycin. He is discharged home with her and Unna's boots and oral doxycycline. During this admission there was no obvious DVT involving the left lower extremity. her past medical history significant for diabetes mellitus, hypertension, hyperlipidemia, varicose veins, status post right rotator cuff repair, knee surgery on the left,robotic abdominal hysterectomy, laparoscopic cholecystectomy. She is not a smoker. Venous study done on 07/18/2015 showed normal left extremity deep venous system with no evidence of DVT. There was extensive valvular incompetence and  reflux throughout the left greater saphenous vein with direct the medication to subcutaneous varicose veins. Normal left small saphenous vein. I understand she was seen by vascular radiology group and the workup and recommendation was for endovenous ablation but due to family pressure she was not able to keep her appointment a year ago. She has not been wearing her compression stockings regularly. 05/26/18 READMISSION this is an 84 year old woman who was previously seen in this clinic in 2017 by Dr. Con Memos. She has chronic venous insufficiency with lymphedema and at that time had open area on the left medial calf and ankle area. Was recommended that she wear 20- 30 mm compression stockings and the patient tells me that she has been compliant with this although I think her primary doctor recently told her that she didn't need to wear stockings out of fear it might cause arterial compression. She noticed edema and pain in the area a week to 2 ago. She saw her primary doctor and was given doxycycline and Silvadene cream to put on the area. She states things are a lot better. The patient has a history of type 2 diabetes on oral agents but she is unaware of her hemoglobin A1c for her blood glucose which she doesn't check. She has hypertension, varicose veins, rotator cuff repair, knee surgery on the left, history of a laparoscopic cholecystectomy, lymphedema, obstructive sleep apnea, history of endometrial CA. The patient has been to see vein and vascular in the past and I think has had an ablation of the left greater saphenous vein based on ultrasounds of the left leg IC dating back to 2017. Her most recent ultrasound was in May 2018 which showed no evidence of a DVT and continued durable closure of the treated segment of the left greater saphenous vein. Patent lateral accessory branch of the greater saphenous vein extending to the calf ABIs in our  clinic were 1.05 on the right and 0.95 on the  left 06/02/18 the patient's wound on the left medial lower calf is completely closed and epithelialized. She has new 20-30 mm below- knee stockings ADREONNA, YONTZ ([id number]) Electronic Signature(s) Signed: 06/02/2018 6:13:14 PM By: Linton Ham MD Entered By: Linton Ham on 06/02/2018 12:02:37 Jenna [name] ([id number]) -------------------------------------------------------------------------------- Physical Exam Details Patient Name: Jenna [name] Date of Service: 06/02/2018 10:15 AM Medical Record Number: [id number] Patient Account Number: [id number] Date of Birth/Sex: June 02, 1931 (84 y.o. F) Treating RN: Cornell Barman Primary Care Provider: Lujean Amel Other Clinician: Referring Provider: Lujean Amel Treating Provider/Extender: Tito Dine in Treatment: 1 Constitutional Patient is hypertensive.. Pulse regular and within target range for patient.Marland Kitchen Respirations regular, non-labored and within target range.. Temperature is normal and within the target range for the patient.Marland Kitchen appears in no distress. Notes one exam; small superficial area on the lower left calf just above the left medial malleolus is totally healed this week. There is no open area. She does have very tight adherent fibrosed area around this with considerable degree of hemosiderin deposition. We will have to see of 2030 mm stockings are sufficient here Electronic Signature(s) Signed: 06/02/2018 6:13:14 PM By: Linton Ham MD Entered By: Linton Ham on 06/02/2018 12:04:00 Jenna [name] ([id number]) -------------------------------------------------------------------------------- Physician Orders Details Patient Name: Jenna [name] Date of Service: 06/02/2018 10:15 AM Medical Record Number: [id number] Patient Account Number: [id number] Date of Birth/Sex: 1931-12-18 (84 y.o. F) Treating RN: Cornell Barman Primary Care Provider: Lujean Amel Other Clinician: Referring Provider:  Lujean Amel Treating Provider/Extender: Tito Dine in Treatment: 1 Verbal / Phone Orders: No Diagnosis Coding Edema Control Wound #2 Left,Medial Lower Leg o Patient to wear own compression stockings Discharge From [facility] Wound #2 Left,Medial Lower Leg o Discharge from [place] complete Electronic Signature(s) Signed: 06/02/2018 6:13:14 PM By: Linton Ham MD Signed: 06/03/2018 2:15:42 PM By: Gretta Cool, [name], RN, CWS, [name], [name] Entered By: Gretta Cool, [name], RN, CWS, Kim on 06/02/2018 10:35:43 Jenna [name] ([id number]) -------------------------------------------------------------------------------- Problem List Details Patient Name: AILENE, ROYAL. Date of Service: 06/02/2018 10:15 AM Medical Record Number: [id number] Patient Account Number: [id number] Date of Birth/Sex: Feb 04, 1931 (84 y.o. F) Treating RN: Cornell Barman Primary Care Provider: Lujean Amel Other Clinician: Referring Provider: Lujean Amel Treating Provider/Extender: Tito Dine in Treatment: 1 Active Problems ICD-10 Impacting Encounter Code Description Active Date Wound Healing Diagnosis L97.221 Non-pressure chronic ulcer of left calf limited to breakdown of 05/26/2018 No Yes skin I87.332 Chronic venous hypertension (idiopathic) with ulcer and 05/26/2018 No Yes inflammation of left lower extremity I89.0 Lymphedema, not elsewhere classified 05/26/2018 No Yes Inactive Problems Resolved Problems Electronic Signature(s) Signed: 06/02/2018 6:13:14 PM By: Linton Ham MD Entered By: Linton Ham on 06/02/2018 11:59:11 Warnick, [name] ([id number]) -------------------------------------------------------------------------------- Progress Note Details Patient Name: Jenna [name] Date of Service: 06/02/2018 10:15 AM Medical Record Number: [id number] Patient Account Number: [id number] Date of Birth/Sex: 1931/11/10 (84 y.o. F) Treating RN: Cornell Barman Primary  Care Provider: Lujean Amel Other Clinician: Referring Provider: Lujean Amel Treating Provider/Extender: Tito Dine in Treatment: 1 Subjective History of Present Illness (HPI) The following HPI elements were documented for the patient's wound: Location: left lower extremity in the medial ankle region Quality: Patient reports experiencing a dull pain to affected area(s). Severity: Patient states wound are getting worse. Duration: Patient has had the wound for > 3 months prior to seeking treatment at the wound center Timing:  Pain in wound is Intermittent (comes and goes Context: The wound appeared gradually over time Modifying Factors: Other treatment(s) tried include:[name] Associated Signs and Symptoms: Patient reports having increase swelling. 84 year old patient with a past medical history significant for venous stasis ulceration and diabetes mellitus was recently admitted to the hospital between 07/07/2016 and 07/09/2016. She was wound to have a nonpurulent cellulitis of the left lower extremity and was started on IV antibiotics which included vancomycin. He is discharged home with her and Unna's boots and oral doxycycline. During this admission there was no obvious DVT involving the left lower extremity. her past medical history significant for diabetes mellitus, hypertension, hyperlipidemia, varicose veins, status post right rotator cuff repair, knee surgery on the left,robotic abdominal hysterectomy, laparoscopic cholecystectomy. She is not a smoker. Venous study done on 07/18/2015 showed normal left extremity deep venous system with no evidence of DVT. There was extensive valvular incompetence and reflux throughout the left greater saphenous vein with direct the medication to subcutaneous varicose veins. Normal left small saphenous vein. I understand she was seen by vascular radiology group and the workup and  recommendation was for endovenous ablation but due to family pressure she was not able to keep her appointment a year ago. She has not been wearing her compression stockings regularly. 05/26/18 READMISSION this is an 84 year old woman who was previously seen in this clinic in 2017 by Dr. Con Memos. She has chronic venous insufficiency with lymphedema and at that time had open area on the left medial calf and ankle area. Was recommended that she wear 20- 30 mm compression stockings and the patient tells me that she has been compliant with this although I think her primary doctor recently told her that she didn't need to wear stockings out of fear it might cause arterial compression. She noticed edema and pain in the area a week to 2 ago. She saw her primary doctor and was given doxycycline and Silvadene cream to put on the area. She states things are a lot better. The patient has a history of type 2 diabetes on oral agents but she is unaware of her hemoglobin A1c for her blood glucose which she doesn't check. She has hypertension, varicose veins, rotator cuff repair, knee surgery on the left, history of a laparoscopic cholecystectomy, lymphedema, obstructive sleep apnea, history of endometrial CA. The patient has been to see vein and vascular in the past and I think has had an ablation of the left greater saphenous vein based on ultrasounds of the left leg IC dating back to 2017. Her most recent ultrasound was in May 2018 which showed no evidence of a DVT and continued durable closure of the treated segment of the left greater saphenous vein. Patent lateral accessory branch of the greater saphenous vein extending to the calf ABIs in our clinic were 1.05 on the right and 0.95 on the left 06/02/18 the patient's wound on the left medial lower calf is completely closed and epithelialized. She has new 20-30 mm below- knee stockings DEYANA, WNUK ([id number]) Objective Constitutional Patient is  hypertensive.. Pulse regular and within target range for patient.Marland Kitchen Respirations regular, non-labored and within target range.. Temperature is normal and within the target range for the patient.Marland Kitchen appears in no distress. Vitals Time Taken: 10:12 AM, Height: 60 in, Weight: 232 lbs, BMI: 45.3, Temperature: 98.0 F, Pulse: 72 bpm, Respiratory Rate: 18 breaths/min, Blood Pressure: 150/64 mmHg. General Notes: one exam; small superficial area on the lower  left calf just above the left medial malleolus is totally healed this week. There is no open area. She does have very tight adherent fibrosed area around this with considerable degree of hemosiderin deposition. We will have to see of 2030 mm stockings are sufficient here Integumentary (Hair, Skin) Wound #2 status is Healed - Epithelialized. Original cause of wound was Gradually Appeared. The wound is located on the Left,Medial Lower Leg. The wound measures 0cm length x 0cm width x 0cm depth; 0cm^2 area and 0cm^3 volume. There is no tunneling or undermining noted. There is a none present amount of drainage noted. The wound margin is flat and intact. There is no granulation within the wound bed. There is no necrotic tissue within the wound bed. The periwound skin appearance exhibited: Scarring. The periwound skin appearance did not exhibit: Callus, Crepitus, Excoriation, Induration, Rash, Dry/Scaly, Maceration, Atrophie Blanche, Cyanosis, Ecchymosis, Hemosiderin Staining, Mottled, Pallor, Rubor, Erythema. Periwound temperature was noted as No Abnormality. Assessment Active Problems ICD-10 Non-pressure chronic ulcer of left calf limited to breakdown of skin Chronic venous hypertension (idiopathic) with ulcer and inflammation of left lower extremity Lymphedema, not elsewhere classified Plan Edema Control: Wound #2 Left,Medial Lower Leg: Patient to wear own compression stockings Discharge From [facility] Services: Wound #2 Left,Medial Lower Leg: Discharge  from [place] complete REXINE, GOWENS ([id number]) #1 the patient to be discharged from the wound care center #2 using her on 20-30 mm below-the-knee stockings with instructions to lubricate the skin in both legs #3 we'll have to see if that provides enough compression to maintain skin integrity Electronic Signature(s) Signed: 06/02/2018 6:13:14 PM By: Linton Ham MD Entered By: Linton Ham on 06/02/2018 12:06:30 Jenna [name] ([id number]) -------------------------------------------------------------------------------- [place] Details Patient Name: Jenna [name] Date of Service: 06/02/2018 Medical Record Number: [id number] Patient Account Number: [id number] Date of Birth/Sex: 1931/04/28 (84 y.o. F) Treating RN: Cornell Barman Primary Care Provider: Lujean Amel Other Clinician: Referring Provider: Lujean Amel Treating Provider/Extender: Tito Dine in Treatment: 1 Diagnosis Coding ICD-10 Codes Code Description [phone] Non-pressure chronic ulcer of left calf limited to breakdown of skin I87.332 Chronic venous hypertension (idiopathic) with ulcer and inflammation of left lower extremity I89.0 Lymphedema, not elsewhere classified Facility Procedures CPT4 Code: [id number] Description: [phone] - WOUND CARE VISIT-LEV 1 EST PT Modifier: Quantity: 1 Physician Procedures CPT4: Description Modifier Quantity Code [id number] 29476 - WC PHYS LEVEL 2 - EST PT 1 ICD-10 Diagnosis Description L97.221 Non-pressure chronic ulcer of left calf limited to breakdown of skin I87.332 Chronic venous hypertension (idiopathic) with ulcer and  inflammation of left lower extremity Electronic Signature(s) Signed: 06/02/2018 6:13:14 PM By: Linton Ham MD Entered By: Linton Ham on 06/02/2018 12:06:57

## 2018-06-04 NOTE — Progress Notes (Signed)
Photos Photo Uploaded By: Roger Shelter on 06/02/2018 17:20:01 Wound Measurements Length: (cm) 0 % Re Width: (cm) 0 % Re Depth: (cm) 0 Epit Area: (cm) 0 Tun Volume: (cm) 0 Und duction in Area:  100% duction in Volume: 100% helialization: Large (67-100%) neling: No ermining: No Wound Description Classification: Partial Thickness Wound Margin: Flat and Intact Exudate Amount: None Present Foul Odor After Cleansing: No Slough/Fibrino No Wound Bed Granulation Amount: None Present (0%) Exposed Structure Necrotic Amount: None Present (0%) Fascia Exposed: No Fat Layer (Subcutaneous Tissue) Exposed: No Tendon Exposed: No Muscle Exposed: No Joint Exposed: No Bone Exposed: No Periwound Skin Texture Texture Color No Abnormalities Noted: No No Abnormalities Noted: No [place], CARMACK ([id number]) Callus: No Atrophie Blanche: No Crepitus: No Cyanosis: No Excoriation: No Ecchymosis: No Induration: No Erythema: No Rash: No Hemosiderin Staining: No Scarring: Yes Mottled: No Pallor: No Moisture Rubor: No No Abnormalities Noted: No Dry / Scaly: No Temperature / Pain Maceration: No Temperature: No Abnormality Wound Preparation Ulcer Cleansing: Rinsed/Irrigated with Saline Topical Anesthetic Applied: Other: lidocaine 4%, Electronic Signature(s) Signed: 06/03/2018 2:15:42 PM By: Gretta Cool, [name], RN, CWS, [name], [name] Entered By: Gretta Cool, [name], RN, CWS, Kim on 06/02/2018 10:36:12 Jenna [name] ([id number]) -------------------------------------------------------------------------------- Vitals Details Patient Name: Jenna [name] Date of Service: 06/02/2018 10:15 AM Medical Record Number: [id number] Patient Account Number: [id number] Date of Birth/Sex: Apr 11, 1931 (84 y.o. F) Treating RN: Roger Shelter Primary Care Unique Sillas: Lujean Amel Other Clinician: Referring Gabriela Irigoyen: Lujean Amel Treating Elmo Rio/Extender: Tito Dine in Treatment: 1 Vital Signs Time Taken: 10:12 Temperature (F): 98.0 Height (in): 60 Pulse (bpm): 72 Weight (lbs): 232 Respiratory Rate (breaths/min): 18 Body Mass Index (BMI): 45.3 Blood Pressure (mmHg): 150/64 Reference  Range: 80 - 120 mg / dl Electronic Signature(s) Signed: 06/02/2018 5:08:02 PM By: Roger Shelter Entered By: Roger Shelter on 06/02/2018 10:12:26  any number of wounds) []  - 0 Wound Tracing (instead of photographs) []  - 0 Simple Wound Measurement - one wound []  - 0 Complex Wound Measurement - multiple wounds INTERVENTIONS - Wound Dressings []  - Small Wound Dressing one or multiple wounds 0 []  - 0 Medium Wound Dressing one or multiple wounds []  - 0 Large Wound Dressing one or multiple wounds []  - 0 Application of Medications - topical []  - 0 Application of Medications - injection INTERVENTIONS - Miscellaneous []  - External ear exam 0 []  - 0 Specimen Collection (cultures, biopsies, blood, body fluids, etc.) []  - 0 Specimen(s) / Culture(s) sent or taken to Lab for analysis []  - 0 Patient Transfer (multiple staff / Civil Service fast streamer / Similar devices) []  - 0 Simple Staple / Suture removal (25 or less) []  - 0 Complex Staple / Suture removal (26 or more) []  - 0 Hypo / Hyperglycemic Management (close monitor of Blood Glucose) []  - 0 Ankle / Brachial Index (ABI) - do not check if billed separately X- 1 5 Vital Signs Jenna [name], Jenna [name] ([id number]) Has the patient been seen at the hospital within the last three years: Yes Total Score: 35 Level Of Care: New/Established - Level 1 Electronic Signature(s) Signed: 06/03/2018 2:15:42 PM By: Gretta Cool, [name], RN, CWS, [name], [name] Entered By: Gretta Cool, [name], RN, CWS, Kim on 06/02/2018 10:36:44 Jenna [name] ([id number]) -------------------------------------------------------------------------------- Encounter Discharge Information Details Patient Name: Jenna [name] Date of Service: 06/02/2018 10:15 AM Medical Record Number: [id number] Patient Account Number: [id number] Date of Birth/Sex: 27-Sep-1931 (84 y.o. F) Treating RN: Cornell Barman Primary Care Styles Fambro: Lujean Amel Other Clinician: Referring Donata Reddick:  Lujean Amel Treating Jamarcus Laduke/Extender: Tito Dine in Treatment: 1 Encounter Discharge Information Items Discharge Condition: Stable Ambulatory Status: Ambulatory Discharge Destination: Home Transportation: Private Auto Accompanied By: self Schedule Follow-up Appointment: No Clinical Summary of Care: Electronic Signature(s) Signed: 06/03/2018 2:15:42 PM By: Gretta Cool, [name], RN, CWS, [name], [name] Entered By: Gretta Cool, [name], RN, CWS, Kim on 06/02/2018 10:43:41 Jenna [name] ([id number]) -------------------------------------------------------------------------------- Lower Extremity Assessment Details Patient Name: Jenna [name], Jenna [name] Date of Service: 06/02/2018 10:15 AM Medical Record Number: [id number] Patient Account Number: [id number] Date of Birth/Sex: 1931-10-23 (84 y.o. F) Treating RN: Roger Shelter Primary Care Demetrius Mahler: Lujean Amel Other Clinician: Referring Meagan Spease: Lujean Amel Treating Nirel Babler/Extender: Tito Dine in Treatment: 1 Edema Assessment Assessed: [Left: No] [Right: No] Edema: [Left: N] [Right: o] Calf Left: Right: Point of Measurement: 33 cm From Medial Instep 47 cm cm Ankle Left: Right: Point of Measurement: 11 cm From Medial Instep 24.5 cm cm Vascular Assessment Claudication: Claudication Assessment [Left:None] Pulses: Dorsalis Pedis Palpable: [Left:Yes] Posterior Tibial Extremity colors, hair growth, and conditions: Extremity Color: [Left:Hyperpigmented] Hair Growth on Extremity: [Left:No] Temperature of Extremity: [Left:Warm] Capillary Refill: [Left:< 3 seconds] Toe Nail Assessment Left: Right: Thick: No Discolored: No Deformed: No Improper Length and Hygiene: No Electronic Signature(s) Signed: 06/02/2018 5:08:02 PM By: Roger Shelter Entered By: Roger Shelter on 06/02/2018 10:14:48 Jenna [name] ([id number]) -------------------------------------------------------------------------------- Multi Wound  Chart Details Patient Name: Jenna [name] Date of Service: 06/02/2018 10:15 AM Medical Record Number: [id number] Patient Account Number: [id number] Date of Birth/Sex: 17-Apr-1931 (84 y.o. F) Treating RN: Cornell Barman Primary Care Weslynn Ke: Lujean Amel Other Clinician: Referring Tienna Bienkowski: Lujean Amel Treating Valon Glasscock/Extender: Tito Dine in Treatment: 1 Vital Signs Height(in): 60 Pulse(bpm): 72 Weight(lbs): 232 Blood Pressure(mmHg): 150/64 Body Mass Index(BMI): 45 Temperature(F): 98.0 Respiratory Rate 18 (breaths/min): Photos: [2:No Photos] [N/A:N/A] Wound Location: [2:Left, Medial Lower  any number of wounds) []  - 0 Wound Tracing (instead of photographs) []  - 0 Simple Wound Measurement - one wound []  - 0 Complex Wound Measurement - multiple wounds INTERVENTIONS - Wound Dressings []  - Small Wound Dressing one or multiple wounds 0 []  - 0 Medium Wound Dressing one or multiple wounds []  - 0 Large Wound Dressing one or multiple wounds []  - 0 Application of Medications - topical []  - 0 Application of Medications - injection INTERVENTIONS - Miscellaneous []  - External ear exam 0 []  - 0 Specimen Collection (cultures, biopsies, blood, body fluids, etc.) []  - 0 Specimen(s) / Culture(s) sent or taken to Lab for analysis []  - 0 Patient Transfer (multiple staff / Civil Service fast streamer / Similar devices) []  - 0 Simple Staple / Suture removal (25 or less) []  - 0 Complex Staple / Suture removal (26 or more) []  - 0 Hypo / Hyperglycemic Management (close monitor of Blood Glucose) []  - 0 Ankle / Brachial Index (ABI) - do not check if billed separately X- 1 5 Vital Signs Jenna [name], Jenna [name] ([id number]) Has the patient been seen at the hospital within the last three years: Yes Total Score: 35 Level Of Care: New/Established - Level 1 Electronic Signature(s) Signed: 06/03/2018 2:15:42 PM By: Gretta Cool, [name], RN, CWS, [name], [name] Entered By: Gretta Cool, [name], RN, CWS, Kim on 06/02/2018 10:36:44 Jenna [name] ([id number]) -------------------------------------------------------------------------------- Encounter Discharge Information Details Patient Name: Jenna [name] Date of Service: 06/02/2018 10:15 AM Medical Record Number: [id number] Patient Account Number: [id number] Date of Birth/Sex: 27-Sep-1931 (84 y.o. F) Treating RN: Cornell Barman Primary Care Styles Fambro: Lujean Amel Other Clinician: Referring Donata Reddick:  Lujean Amel Treating Jamarcus Laduke/Extender: Tito Dine in Treatment: 1 Encounter Discharge Information Items Discharge Condition: Stable Ambulatory Status: Ambulatory Discharge Destination: Home Transportation: Private Auto Accompanied By: self Schedule Follow-up Appointment: No Clinical Summary of Care: Electronic Signature(s) Signed: 06/03/2018 2:15:42 PM By: Gretta Cool, [name], RN, CWS, [name], [name] Entered By: Gretta Cool, [name], RN, CWS, Kim on 06/02/2018 10:43:41 Jenna [name] ([id number]) -------------------------------------------------------------------------------- Lower Extremity Assessment Details Patient Name: Jenna [name], Jenna [name] Date of Service: 06/02/2018 10:15 AM Medical Record Number: [id number] Patient Account Number: [id number] Date of Birth/Sex: 1931-10-23 (84 y.o. F) Treating RN: Roger Shelter Primary Care Demetrius Mahler: Lujean Amel Other Clinician: Referring Meagan Spease: Lujean Amel Treating Nirel Babler/Extender: Tito Dine in Treatment: 1 Edema Assessment Assessed: [Left: No] [Right: No] Edema: [Left: N] [Right: o] Calf Left: Right: Point of Measurement: 33 cm From Medial Instep 47 cm cm Ankle Left: Right: Point of Measurement: 11 cm From Medial Instep 24.5 cm cm Vascular Assessment Claudication: Claudication Assessment [Left:None] Pulses: Dorsalis Pedis Palpable: [Left:Yes] Posterior Tibial Extremity colors, hair growth, and conditions: Extremity Color: [Left:Hyperpigmented] Hair Growth on Extremity: [Left:No] Temperature of Extremity: [Left:Warm] Capillary Refill: [Left:< 3 seconds] Toe Nail Assessment Left: Right: Thick: No Discolored: No Deformed: No Improper Length and Hygiene: No Electronic Signature(s) Signed: 06/02/2018 5:08:02 PM By: Roger Shelter Entered By: Roger Shelter on 06/02/2018 10:14:48 Jenna [name] ([id number]) -------------------------------------------------------------------------------- Multi Wound  Chart Details Patient Name: Jenna [name] Date of Service: 06/02/2018 10:15 AM Medical Record Number: [id number] Patient Account Number: [id number] Date of Birth/Sex: 17-Apr-1931 (84 y.o. F) Treating RN: Cornell Barman Primary Care Weslynn Ke: Lujean Amel Other Clinician: Referring Tienna Bienkowski: Lujean Amel Treating Valon Glasscock/Extender: Tito Dine in Treatment: 1 Vital Signs Height(in): 60 Pulse(bpm): 72 Weight(lbs): 232 Blood Pressure(mmHg): 150/64 Body Mass Index(BMI): 45 Temperature(F): 98.0 Respiratory Rate 18 (breaths/min): Photos: [2:No Photos] [N/A:N/A] Wound Location: [2:Left, Medial Lower  any number of wounds) []  - 0 Wound Tracing (instead of photographs) []  - 0 Simple Wound Measurement - one wound []  - 0 Complex Wound Measurement - multiple wounds INTERVENTIONS - Wound Dressings []  - Small Wound Dressing one or multiple wounds 0 []  - 0 Medium Wound Dressing one or multiple wounds []  - 0 Large Wound Dressing one or multiple wounds []  - 0 Application of Medications - topical []  - 0 Application of Medications - injection INTERVENTIONS - Miscellaneous []  - External ear exam 0 []  - 0 Specimen Collection (cultures, biopsies, blood, body fluids, etc.) []  - 0 Specimen(s) / Culture(s) sent or taken to Lab for analysis []  - 0 Patient Transfer (multiple staff / Civil [employer] fast streamer / Similar devices) []  - 0 Simple Staple / Suture removal (25 or less) []  - 0 Complex Staple / Suture removal (26 or more) []  - 0 Hypo / Hyperglycemic Management (close monitor of Blood Glucose) []  - 0 Ankle / Brachial Index (ABI) - do not check if billed separately X- 1 5 Vital Signs Jenna [name], Jenna [name] ([id number]) Has the patient been seen at the hospital within the last three years: Yes Total Score: 35 Level Of Care: New/Established - Level 1 Electronic Signature(s) Signed: 06/03/2018 2:15:42 PM By: Gretta Cool, [name], RN, CWS, [name], [name] Entered By: Gretta Cool, [name], RN, CWS, Kim on 06/02/2018 10:36:44 Jenna [name] ([id number]) -------------------------------------------------------------------------------- Encounter Discharge Information Details Patient Name: Jenna [name] Date of Service: 06/02/2018 10:15 AM Medical Record Number: [id number] Patient Account Number: [id number] Date of Birth/Sex: 27-Sep-1931 (84 y.o. F) Treating RN: Cornell Barman Primary Care Styles Fambro: Lujean Amel Other Clinician: Referring Donata Reddick:  Lujean Amel Treating Jamarcus Laduke/Extender: Tito Dine in Treatment: 1 Encounter Discharge Information Items Discharge Condition: Stable Ambulatory Status: Ambulatory Discharge Destination: Home Transportation: Private Auto Accompanied By: self Schedule Follow-up Appointment: No Clinical Summary of Care: Electronic Signature(s) Signed: 06/03/2018 2:15:42 PM By: Gretta Cool, [name], RN, CWS, [name], [name] Entered By: Gretta Cool, [name], RN, CWS, Kim on 06/02/2018 10:43:41 Jenna [name] ([id number]) -------------------------------------------------------------------------------- Lower Extremity Assessment Details Patient Name: Jenna [name], Jenna [name] Date of Service: 06/02/2018 10:15 AM Medical Record Number: [id number] Patient Account Number: [id number] Date of Birth/Sex: 1931-10-23 (84 y.o. F) Treating RN: Roger Shelter Primary Care Demetrius Mahler: Lujean Amel Other Clinician: Referring Meagan Spease: Lujean Amel Treating Nirel Babler/Extender: Tito Dine in Treatment: 1 Edema Assessment Assessed: [Left: No] [Right: No] Edema: [Left: N] [Right: o] Calf Left: Right: Point of Measurement: 33 cm From Medial Instep 47 cm cm Ankle Left: Right: Point of Measurement: 11 cm From Medial Instep 24.5 cm cm Vascular Assessment Claudication: Claudication Assessment [Left:None] Pulses: Dorsalis Pedis Palpable: [Left:Yes] Posterior Tibial Extremity colors, hair growth, and conditions: Extremity Color: [Left:Hyperpigmented] Hair Growth on Extremity: [Left:No] Temperature of Extremity: [Left:Warm] Capillary Refill: [Left:< 3 seconds] Toe Nail Assessment Left: Right: Thick: No Discolored: No Deformed: No Improper Length and Hygiene: No Electronic Signature(s) Signed: 06/02/2018 5:08:02 PM By: Roger Shelter Entered By: Roger Shelter on 06/02/2018 10:14:48 Jenna [name] ([id number]) -------------------------------------------------------------------------------- Multi Wound  Chart Details Patient Name: Jenna [name] Date of Service: 06/02/2018 10:15 AM Medical Record Number: [id number] Patient Account Number: [id number] Date of Birth/Sex: 17-Apr-1931 (84 y.o. F) Treating RN: Cornell Barman Primary Care Weslynn Ke: Lujean Amel Other Clinician: Referring Tienna Bienkowski: Lujean Amel Treating Valon Glasscock/Extender: Tito Dine in Treatment: 1 Vital Signs Height(in): 60 Pulse(bpm): 72 Weight(lbs): 232 Blood Pressure(mmHg): 150/64 Body Mass Index(BMI): 45 Temperature(F): 98.0 Respiratory Rate 18 (breaths/min): Photos: [2:No Photos] [N/A:N/A] Wound Location: [2:Left, Medial Lower

## 2018-06-24 DIAGNOSIS — E119 Type 2 diabetes mellitus without complications: Secondary | ICD-10-CM | POA: Diagnosis not present

## 2018-06-24 DIAGNOSIS — H401132 Primary open-angle glaucoma, bilateral, moderate stage: Secondary | ICD-10-CM | POA: Diagnosis not present

## 2018-06-24 DIAGNOSIS — H2511 Age-related nuclear cataract, right eye: Secondary | ICD-10-CM | POA: Diagnosis not present

## 2018-06-24 DIAGNOSIS — H43813 Vitreous degeneration, bilateral: Secondary | ICD-10-CM | POA: Diagnosis not present

## 2018-07-14 DIAGNOSIS — L97921 Non-pressure chronic ulcer of unspecified part of left lower leg limited to breakdown of skin: Secondary | ICD-10-CM | POA: Diagnosis not present

## 2018-07-14 DIAGNOSIS — E119 Type 2 diabetes mellitus without complications: Secondary | ICD-10-CM | POA: Diagnosis not present

## 2018-07-14 DIAGNOSIS — R600 Localized edema: Secondary | ICD-10-CM | POA: Diagnosis not present

## 2018-07-14 DIAGNOSIS — I872 Venous insufficiency (chronic) (peripheral): Secondary | ICD-10-CM | POA: Diagnosis not present

## 2018-07-21 DIAGNOSIS — L97921 Non-pressure chronic ulcer of unspecified part of left lower leg limited to breakdown of skin: Secondary | ICD-10-CM | POA: Diagnosis not present

## 2018-07-21 DIAGNOSIS — I872 Venous insufficiency (chronic) (peripheral): Secondary | ICD-10-CM | POA: Diagnosis not present

## 2018-07-21 DIAGNOSIS — I10 Essential (primary) hypertension: Secondary | ICD-10-CM | POA: Diagnosis not present

## 2018-07-21 DIAGNOSIS — E119 Type 2 diabetes mellitus without complications: Secondary | ICD-10-CM | POA: Diagnosis not present

## 2018-07-21 DIAGNOSIS — R600 Localized edema: Secondary | ICD-10-CM | POA: Diagnosis not present

## 2018-07-21 DIAGNOSIS — Z79899 Other long term (current) drug therapy: Secondary | ICD-10-CM | POA: Diagnosis not present

## 2018-10-21 DIAGNOSIS — H43813 Vitreous degeneration, bilateral: Secondary | ICD-10-CM | POA: Diagnosis not present

## 2018-10-21 DIAGNOSIS — E119 Type 2 diabetes mellitus without complications: Secondary | ICD-10-CM | POA: Diagnosis not present

## 2018-10-21 DIAGNOSIS — H401132 Primary open-angle glaucoma, bilateral, moderate stage: Secondary | ICD-10-CM | POA: Diagnosis not present

## 2018-11-17 DIAGNOSIS — J069 Acute upper respiratory infection, unspecified: Secondary | ICD-10-CM | POA: Diagnosis not present

## 2018-11-17 DIAGNOSIS — I872 Venous insufficiency (chronic) (peripheral): Secondary | ICD-10-CM | POA: Diagnosis not present

## 2018-11-17 DIAGNOSIS — E119 Type 2 diabetes mellitus without complications: Secondary | ICD-10-CM | POA: Diagnosis not present

## 2018-11-17 DIAGNOSIS — Z23 Encounter for immunization: Secondary | ICD-10-CM | POA: Diagnosis not present

## 2018-11-17 DIAGNOSIS — Z79899 Other long term (current) drug therapy: Secondary | ICD-10-CM | POA: Diagnosis not present

## 2018-11-17 DIAGNOSIS — I10 Essential (primary) hypertension: Secondary | ICD-10-CM | POA: Diagnosis not present

## 2018-11-17 DIAGNOSIS — Z7984 Long term (current) use of oral hypoglycemic drugs: Secondary | ICD-10-CM | POA: Diagnosis not present

## 2018-12-01 DIAGNOSIS — Z79899 Other long term (current) drug therapy: Secondary | ICD-10-CM | POA: Diagnosis not present

## 2018-12-01 DIAGNOSIS — Z7984 Long term (current) use of oral hypoglycemic drugs: Secondary | ICD-10-CM | POA: Diagnosis not present

## 2018-12-01 DIAGNOSIS — E119 Type 2 diabetes mellitus without complications: Secondary | ICD-10-CM | POA: Diagnosis not present

## 2019-02-01 DIAGNOSIS — Z1231 Encounter for screening mammogram for malignant neoplasm of breast: Secondary | ICD-10-CM | POA: Diagnosis not present

## 2019-02-01 DIAGNOSIS — Z01419 Encounter for gynecological examination (general) (routine) without abnormal findings: Secondary | ICD-10-CM | POA: Diagnosis not present

## 2019-02-01 DIAGNOSIS — Z8542 Personal history of malignant neoplasm of other parts of uterus: Secondary | ICD-10-CM | POA: Diagnosis not present

## 2019-02-01 DIAGNOSIS — C541 Malignant neoplasm of endometrium: Secondary | ICD-10-CM | POA: Diagnosis not present

## 2019-02-01 DIAGNOSIS — Z6841 Body Mass Index (BMI) 40.0 and over, adult: Secondary | ICD-10-CM | POA: Diagnosis not present

## 2019-02-01 DIAGNOSIS — R350 Frequency of micturition: Secondary | ICD-10-CM | POA: Diagnosis not present

## 2019-02-04 DIAGNOSIS — H43813 Vitreous degeneration, bilateral: Secondary | ICD-10-CM | POA: Diagnosis not present

## 2019-02-04 DIAGNOSIS — E119 Type 2 diabetes mellitus without complications: Secondary | ICD-10-CM | POA: Diagnosis not present

## 2019-02-04 DIAGNOSIS — H401132 Primary open-angle glaucoma, bilateral, moderate stage: Secondary | ICD-10-CM | POA: Diagnosis not present

## 2019-04-20 DIAGNOSIS — E119 Type 2 diabetes mellitus without complications: Secondary | ICD-10-CM | POA: Diagnosis not present

## 2019-04-20 DIAGNOSIS — E7800 Pure hypercholesterolemia, unspecified: Secondary | ICD-10-CM | POA: Diagnosis not present

## 2019-04-20 DIAGNOSIS — Z79899 Other long term (current) drug therapy: Secondary | ICD-10-CM | POA: Diagnosis not present

## 2019-04-25 DIAGNOSIS — E7800 Pure hypercholesterolemia, unspecified: Secondary | ICD-10-CM | POA: Diagnosis not present

## 2019-04-25 DIAGNOSIS — E119 Type 2 diabetes mellitus without complications: Secondary | ICD-10-CM | POA: Diagnosis not present

## 2019-04-25 DIAGNOSIS — Z0001 Encounter for general adult medical examination with abnormal findings: Secondary | ICD-10-CM | POA: Diagnosis not present

## 2019-04-25 DIAGNOSIS — I872 Venous insufficiency (chronic) (peripheral): Secondary | ICD-10-CM | POA: Diagnosis not present

## 2019-04-25 DIAGNOSIS — I10 Essential (primary) hypertension: Secondary | ICD-10-CM | POA: Diagnosis not present

## 2019-04-25 DIAGNOSIS — R600 Localized edema: Secondary | ICD-10-CM | POA: Diagnosis not present

## 2019-05-13 DIAGNOSIS — H524 Presbyopia: Secondary | ICD-10-CM | POA: Diagnosis not present

## 2019-05-13 DIAGNOSIS — E119 Type 2 diabetes mellitus without complications: Secondary | ICD-10-CM | POA: Diagnosis not present

## 2019-05-13 DIAGNOSIS — H52223 Regular astigmatism, bilateral: Secondary | ICD-10-CM | POA: Diagnosis not present

## 2019-05-13 DIAGNOSIS — H43813 Vitreous degeneration, bilateral: Secondary | ICD-10-CM | POA: Diagnosis not present

## 2019-05-13 DIAGNOSIS — H25811 Combined forms of age-related cataract, right eye: Secondary | ICD-10-CM | POA: Diagnosis not present

## 2019-05-13 DIAGNOSIS — H5213 Myopia, bilateral: Secondary | ICD-10-CM | POA: Diagnosis not present

## 2019-05-13 DIAGNOSIS — H401132 Primary open-angle glaucoma, bilateral, moderate stage: Secondary | ICD-10-CM | POA: Diagnosis not present

## 2019-06-13 DIAGNOSIS — W19XXXA Unspecified fall, initial encounter: Secondary | ICD-10-CM | POA: Diagnosis not present

## 2019-06-13 DIAGNOSIS — M542 Cervicalgia: Secondary | ICD-10-CM | POA: Diagnosis not present

## 2019-06-13 DIAGNOSIS — M533 Sacrococcygeal disorders, not elsewhere classified: Secondary | ICD-10-CM | POA: Diagnosis not present

## 2019-06-13 DIAGNOSIS — S199XXA Unspecified injury of neck, initial encounter: Secondary | ICD-10-CM | POA: Diagnosis not present

## 2019-06-22 DIAGNOSIS — S0990XA Unspecified injury of head, initial encounter: Secondary | ICD-10-CM | POA: Diagnosis not present

## 2019-06-22 DIAGNOSIS — W19XXXA Unspecified fall, initial encounter: Secondary | ICD-10-CM | POA: Diagnosis not present

## 2019-06-22 DIAGNOSIS — M25551 Pain in right hip: Secondary | ICD-10-CM | POA: Diagnosis not present

## 2019-06-26 DIAGNOSIS — N3001 Acute cystitis with hematuria: Secondary | ICD-10-CM | POA: Diagnosis not present

## 2019-07-06 ENCOUNTER — Encounter: Payer: Self-pay | Admitting: Physical Therapy

## 2019-07-06 ENCOUNTER — Ambulatory Visit: Payer: Medicare Other | Attending: Family Medicine | Admitting: Physical Therapy

## 2019-07-06 ENCOUNTER — Other Ambulatory Visit: Payer: Self-pay

## 2019-07-06 DIAGNOSIS — Z9181 History of falling: Secondary | ICD-10-CM | POA: Diagnosis not present

## 2019-07-06 DIAGNOSIS — G8929 Other chronic pain: Secondary | ICD-10-CM | POA: Diagnosis not present

## 2019-07-06 DIAGNOSIS — R293 Abnormal posture: Secondary | ICD-10-CM | POA: Insufficient documentation

## 2019-07-06 DIAGNOSIS — M545 Low back pain, unspecified: Secondary | ICD-10-CM

## 2019-07-06 NOTE — Therapy (Signed)
[address], [place], [place], [address] Phone: [phone]   Fax:  [phone]  Name: Jenna [name] MRN: [id number] Date of Birth: [birth year]  [facility] [address], [place], [place], [address] Phone: [phone]   Fax:  [phone]  Physical Therapy Evaluation  Patient Details  Name: Jenna [name] MRN: [id number] Date of Birth: 10-17-1931 Referring Provider (PT): Lujean Amel, MD   Encounter Date: 07/06/2019  PT End of Session - 07/06/19 1241    Visit Number  1    Date for PT Re-Evaluation  08/31/19    Authorization Type  medicare    PT Start Time  1232    PT Stop Time  1315    PT Time Calculation (min)  43 min    Activity Tolerance  Patient tolerated treatment well    Behavior During Therapy  [facility] for tasks assessed/performed       Past Medical History:  Diagnosis Date  . Arthritis   . Cancer [facility])    endometrial cancer  . Cellulitis of left lower extremity   . Diabetes mellitus   . Hyperlipemia   . Hypertension   . Non-healing ulcer of lower leg (HCC)    Left leg    . OAB (overactive bladder)   . Obesity    BMI 45    Past Surgical History:  Procedure Laterality Date  . ABDOMINAL HYSTERECTOMY  02/2008   robotic hyst BSO Bilateral LND  . EYE SURGERY     cataract  . IR GENERIC HISTORICAL  09/30/2016   IR EMBO VENOUS NOT HEMORR HEMANG  INC GUIDE ROADMAPPING [facility] ULTRASOUND  . JOINT REPLACEMENT     knee  . KNEE SURGERY     left knee  . LAPAROSCOPIC CHOLECYSTECTOMY SINGLE PORT N/A 07/04/2014   Procedure: LAPAROSCOPIC CHOLECYSTECTOMY SINGLE PORT;  Surgeon: Adin Hector, MD;  Location: [facility];  Service: General;  Laterality: N/A;  . ROTATOR CUFF REPAIR     right arm  . TUBAL LIGATION      There were no vitals filed for this visit.   Subjective Assessment - 07/06/19 1236    Subjective  Pt fell 3 weeks ago about 06/17/19.  Pt states she has had pain in the low back that goes down both.  Pt states she was having trouble getting up from the chair and walking.    Currently in Pain?  Yes    Pain Score  6     Pain Location  Back    Pain Orientation  Right;Left;Mid    Pain Descriptors / Indicators  Sore    Pain Type  Chronic pain    Pain Radiating Towards  bilateral hip and lateral thigh    Pain Onset  More than a month ago   got worse after falling   Pain Frequency  Intermittent    Aggravating Factors   walking and getting up from a chair    Pain Relieving Factors  pain medicine, hot shower    Effect of Pain on Daily Activities  no effect on activities, but has more difficulty    Multiple Pain Sites  No         [facility] PT Assessment - 07/06/19 0001      Assessment   Medical Diagnosis  W19.XXXA (ICD-10-CM) - Unspecified fall, initial encounter; M25.551 (ICD-10-CM) - Pain in right hip    Referring Provider (PT)  Koirala, Dibas, MD    Prior Therapy  No      Precautions   Precautions  None      Restrictions   Weight Bearing Restrictions  No  [facility] [address], [place], [place], [address] Phone: [phone]   Fax:  [phone]  Physical Therapy Evaluation  Patient Details  Name: Jenna [name] MRN: [id number] Date of Birth: 10-17-1931 Referring Provider (PT): Lujean Amel, MD   Encounter Date: 07/06/2019  PT End of Session - 07/06/19 1241    Visit Number  1    Date for PT Re-Evaluation  08/31/19    Authorization Type  medicare    PT Start Time  1232    PT Stop Time  1315    PT Time Calculation (min)  43 min    Activity Tolerance  Patient tolerated treatment well    Behavior During Therapy  [facility] for tasks assessed/performed       Past Medical History:  Diagnosis Date  . Arthritis   . Cancer [facility])    endometrial cancer  . Cellulitis of left lower extremity   . Diabetes mellitus   . Hyperlipemia   . Hypertension   . Non-healing ulcer of lower leg (HCC)    Left leg    . OAB (overactive bladder)   . Obesity    BMI 45    Past Surgical History:  Procedure Laterality Date  . ABDOMINAL HYSTERECTOMY  02/2008   robotic hyst BSO Bilateral LND  . EYE SURGERY     cataract  . IR GENERIC HISTORICAL  09/30/2016   IR EMBO VENOUS NOT HEMORR HEMANG  INC GUIDE ROADMAPPING [facility] ULTRASOUND  . JOINT REPLACEMENT     knee  . KNEE SURGERY     left knee  . LAPAROSCOPIC CHOLECYSTECTOMY SINGLE PORT N/A 07/04/2014   Procedure: LAPAROSCOPIC CHOLECYSTECTOMY SINGLE PORT;  Surgeon: Adin Hector, MD;  Location: [facility];  Service: General;  Laterality: N/A;  . ROTATOR CUFF REPAIR     right arm  . TUBAL LIGATION      There were no vitals filed for this visit.   Subjective Assessment - 07/06/19 1236    Subjective  Pt fell 3 weeks ago about 06/17/19.  Pt states she has had pain in the low back that goes down both.  Pt states she was having trouble getting up from the chair and walking.    Currently in Pain?  Yes    Pain Score  6     Pain Location  Back    Pain Orientation  Right;Left;Mid    Pain Descriptors / Indicators  Sore    Pain Type  Chronic pain    Pain Radiating Towards  bilateral hip and lateral thigh    Pain Onset  More than a month ago   got worse after falling   Pain Frequency  Intermittent    Aggravating Factors   walking and getting up from a chair    Pain Relieving Factors  pain medicine, hot shower    Effect of Pain on Daily Activities  no effect on activities, but has more difficulty    Multiple Pain Sites  No         [facility] PT Assessment - 07/06/19 0001      Assessment   Medical Diagnosis  W19.XXXA (ICD-10-CM) - Unspecified fall, initial encounter; M25.551 (ICD-10-CM) - Pain in right hip    Referring Provider (PT)  Koirala, Dibas, MD    Prior Therapy  No      Precautions   Precautions  None      Restrictions   Weight Bearing Restrictions  No  [facility] [address], [place], [place], [address] Phone: [phone]   Fax:  [phone]  Physical Therapy Evaluation  Patient Details  Name: Jenna [name] MRN: [id number] Date of Birth: 10-17-1931 Referring Provider (PT): Lujean Amel, MD   Encounter Date: 07/06/2019  PT End of Session - 07/06/19 1241    Visit Number  1    Date for PT Re-Evaluation  08/31/19    Authorization Type  medicare    PT Start Time  1232    PT Stop Time  1315    PT Time Calculation (min)  43 min    Activity Tolerance  Patient tolerated treatment well    Behavior During Therapy  [facility] for tasks assessed/performed       Past Medical History:  Diagnosis Date  . Arthritis   . Cancer [facility])    endometrial cancer  . Cellulitis of left lower extremity   . Diabetes mellitus   . Hyperlipemia   . Hypertension   . Non-healing ulcer of lower leg (HCC)    Left leg    . OAB (overactive bladder)   . Obesity    BMI 45    Past Surgical History:  Procedure Laterality Date  . ABDOMINAL HYSTERECTOMY  02/2008   robotic hyst BSO Bilateral LND  . EYE SURGERY     cataract  . IR GENERIC HISTORICAL  09/30/2016   IR EMBO VENOUS NOT HEMORR HEMANG  INC GUIDE [facility]ADMAPPING [facility] ULTRASOUND  . JOINT REPLACEMENT     knee  . KNEE SURGERY     left knee  . LAPAROSCOPIC CHOLECYSTECTOMY SINGLE PORT N/A 07/04/2014   Procedure: LAPAROSCOPIC CHOLECYSTECTOMY SINGLE PORT;  Surgeon: Adin Hector, MD;  Location: [facility];  Service: General;  Laterality: N/A;  . ROTATOR CUFF REPAIR     right arm  . TUBAL LIGATION      There were no vitals filed for this visit.   Subjective Assessment - 07/06/19 1236    Subjective  Pt fell 3 weeks ago about 06/17/19.  Pt states she has had pain in the low back that goes down both.  Pt states she was having trouble getting up from the chair and walking.    Currently in Pain?  Yes    Pain Score  6     Pain Location  Back    Pain Orientation  Right;Left;Mid    Pain Descriptors / Indicators  Sore    Pain Type  Chronic pain    Pain Radiating Towards  bilateral hip and lateral thigh    Pain Onset  More than a month ago   got worse after falling   Pain Frequency  Intermittent    Aggravating Factors   walking and getting up from a chair    Pain Relieving Factors  pain medicine, hot shower    Effect of Pain on Daily Activities  no effect on activities, but has more difficulty    Multiple Pain Sites  No         [facility] PT Assessment - 07/06/19 0001      Assessment   Medical Diagnosis  W19.XXXA (ICD-10-CM) - Unspecified fall, initial encounter; M25.551 (ICD-10-CM) - Pain in right hip    Referring Provider (PT)  Koirala, Dibas, MD    Prior Therapy  No      Precautions   Precautions  None      Restrictions   Weight Bearing Restrictions  No

## 2019-07-06 NOTE — Patient Instructions (Signed)
Access Code: [email or web address]  URL: [email or web address]  Date: 07/06/2019  Prepared by: Jari Favre   Exercises  Seated Hip Abduction with Resistance - 10 reps - 3 sets - 1x daily - 7x weekly  Seated Ankle Dorsiflexion with Resistance - 10 reps - 3 sets - 1x daily - 7x weekly  Sit to Stand with Arm Swing - 10 reps - 3 sets - 1x daily - 7x weekly  Seated Long Arc Quad - 10 reps - 3 sets - 1x daily - 7x weekly

## 2019-07-13 ENCOUNTER — Ambulatory Visit: Payer: Medicare Other

## 2019-07-13 ENCOUNTER — Other Ambulatory Visit: Payer: Self-pay

## 2019-07-13 DIAGNOSIS — Z9181 History of falling: Secondary | ICD-10-CM | POA: Diagnosis not present

## 2019-07-13 DIAGNOSIS — G8929 Other chronic pain: Secondary | ICD-10-CM | POA: Diagnosis not present

## 2019-07-13 DIAGNOSIS — R293 Abnormal posture: Secondary | ICD-10-CM | POA: Diagnosis not present

## 2019-07-13 DIAGNOSIS — M545 Low back pain: Secondary | ICD-10-CM | POA: Diagnosis not present

## 2019-07-13 NOTE — Therapy (Signed)
[facility] [address], [place], [place], [address] Phone: [phone]   Fax:  [phone]  Physical Therapy Treatment  Patient Details  Name: Jenna [name] MRN: [id number] Date of Birth: [birth year] Referring Provider (PT): Lujean Amel, MD   Encounter Date: 07/13/2019  PT End of Session - 07/13/19 1308    Visit Number  2    Date for PT Re-Evaluation  08/31/19    Authorization Type  medicare    PT Start Time  1230    PT Stop Time  1311    PT Time Calculation (min)  41 min    Activity Tolerance  Patient tolerated treatment well    Behavior During Therapy  [facility] for tasks assessed/performed       Past Medical History:  Diagnosis Date  . Arthritis   . Cancer [facility])    endometrial cancer  . Cellulitis of left lower extremity   . Diabetes mellitus   . Hyperlipemia   . Hypertension   . Non-healing ulcer of lower leg (HCC)    Left leg    . OAB (overactive bladder)   . Obesity    BMI 45    Past Surgical History:  Procedure Laterality Date  . ABDOMINAL HYSTERECTOMY  02/2008   robotic hyst BSO Bilateral LND  . EYE SURGERY     cataract  . IR GENERIC HISTORICAL  09/30/2016   IR EMBO VENOUS NOT HEMORR HEMANG  INC GUIDE ROADMAPPING [facility] ULTRASOUND  . JOINT REPLACEMENT     knee  . KNEE SURGERY     left knee  . LAPAROSCOPIC CHOLECYSTECTOMY SINGLE PORT N/A 07/04/2014   Procedure: LAPAROSCOPIC CHOLECYSTECTOMY SINGLE PORT;  Surgeon: Adin Hector, MD;  Location: [facility];  Service: General;  Laterality: N/A;  . ROTATOR CUFF REPAIR     right arm  . TUBAL LIGATION      There were no vitals filed for this visit.  Subjective Assessment - 07/13/19 1234    Subjective  I did my exercises somewhat.    Currently in Pain?  No/denies   up to 4-5/10 with sit to stand                      [facility] Adult PT Treatment/Exercise - 07/13/19 0001      Knee/Hip Exercises: Stretches   Active Hamstring Stretch   Both;3 reps;20 seconds      Knee/Hip Exercises: Aerobic   Nustep  Level 3x 8 minutes   PT present to discuss progress     Knee/Hip Exercises: Standing   Heel Raises  Both;2 sets;20 reps    Hip Abduction  Stengthening;Both;2 sets;10 reps;Knee straight   Pt requires verbal cues for hip position   Hip Extension  Stengthening;Both;2 sets;10 reps    Rocker Board  3 minutes      Knee/Hip Exercises: Seated   Long Arc Quad  Strengthening;Both;2 sets;10 reps    Ball Squeeze  2x10 with 5 second hold    Clamshell with TheraBand  Red   2x10   Marching  Strengthening;20 reps    Abduction/Adduction   --    Abd/Adduction Limitations  DF with red band x25 each    Sit to Sand  20 reps;without UE support               PT Short Term Goals - 07/06/19 1325      PT SHORT TERM GOAL #1   Title  ind with initial  HEP and doing consistently every day    Time  4    Period  Weeks    Status  New    Target Date  08/03/19        PT Long Term Goals - 07/06/19 1325      PT LONG TERM GOAL #1   Title  ind with advanced HEP    Time  8    Period  Weeks    Status  New    Target Date  08/31/19      PT LONG TERM GOAL #2   Title  TUG < or = to 13 sec for reduced risk of falls    Baseline  21 sec    Time  8    Period  Weeks    Status  New    Target Date  08/31/19      PT LONG TERM GOAL #3   Title  pain reduced by 50% when walking    Baseline  6/10 NPRS    Time  8    Period  Weeks    Status  New    Target Date  08/31/19      PT LONG TERM GOAL #4   Title  Pt will demonstrate 5x sit to stand in < or = to 15 sec without using UE to push on chair    Baseline  26 sec; pushes on chair    Time  8    Period  Weeks    Status  New    Target Date  08/31/19            Plan - 07/13/19 1241    Clinical Impression Statement  Pt with first time follow-up after evaluation.  Pt reports moderate compliance with HEP and PT spent portion of the session reviewing.  Pt was able to perform all  exercises correctly.  Pt requires moderate verbal and tactile cuing for technique throughout session. Pt demonstrates LE weakness and limited functional mobility.  Pt will continue to benefit from skilled PT to address flexibility, strength and pain.    PT Frequency  2x / week    PT Duration  8 weeks    PT Treatment/Interventions  ADLs/Self Care Home Management;Neuromuscular re-education;Therapeutic activities;Therapeutic exercise;Patient/family education;Manual techniques;Taping;Moist Heat;Electrical Stimulation;Cryotherapy;Passive range of motion    PT Next Visit Plan  f/u on initial HEP; gait, posture, hip and lumbar stretch    PT Home Exercise Plan  Access Code: [email or web address]    Recommended Other Services  initial certification is signed    Consulted and Agree with Plan of Care  Patient       Patient will benefit from skilled therapeutic intervention in order to improve the following deficits and impairments:  Abnormal gait, Decreased strength, Pain, Difficulty walking, Postural dysfunction  Visit Diagnosis: 1. History of falling   2. Chronic bilateral low back pain, unspecified whether sciatica present   3. Abnormal posture        Problem List Patient Active Problem List   Diagnosis Date Noted  . OSA (obstructive sleep apnea) 02/08/2017  . Murmur, cardiac 12/31/2016  . Morbid obesity ([place]) 12/31/2016  . Cellulitis of left leg 07/07/2016  . Venous stasis ulcer of left lower extremity ([place]) 07/07/2016  . Chronic cholecystitis with calculus s/p lap chole 07/04/2014 07/03/2014  . HTN (hypertension) 05/31/2012  . Non-insulin dependent type 2 diabetes mellitus ([place]) 05/31/2012  . Hypercholesteremia 05/31/2012  . OAB (overactive bladder)   . Malignant neoplasm of corpus uteri, except  isthmus ([place]) 12/10/2011    Class: Stage 1    Sigurd Sos, PT 07/13/19 1:16 PM  [facility] [address] [place], [place],  [address] Phone: [phone]   Fax:  [phone]  Name: Jenna [name] MRN: [id number] Date of Birth: 1931/02/07

## 2019-07-15 ENCOUNTER — Ambulatory Visit: Payer: Medicare Other | Admitting: Physical Therapy

## 2019-07-15 ENCOUNTER — Encounter: Payer: Self-pay | Admitting: Physical Therapy

## 2019-07-15 ENCOUNTER — Other Ambulatory Visit: Payer: Self-pay

## 2019-07-15 DIAGNOSIS — G8929 Other chronic pain: Secondary | ICD-10-CM

## 2019-07-15 DIAGNOSIS — R293 Abnormal posture: Secondary | ICD-10-CM

## 2019-07-15 DIAGNOSIS — Z9181 History of falling: Secondary | ICD-10-CM

## 2019-07-15 DIAGNOSIS — M545 Low back pain: Secondary | ICD-10-CM | POA: Diagnosis not present

## 2019-07-15 NOTE — Therapy (Signed)
[facility] [address], [address] [place], [place], [address] Phone: [phone]   Fax:  [phone]  Physical Therapy Treatment  Patient Details  Name: Jenna [name] MRN: [id number] Date of Birth: 19-Jul-1931 Referring Provider (PT): Darrow Bussing, MD   Encounter Date: 07/15/2019  PT End of Session - 07/15/19 1534    Visit Number  3    Date for PT Re-Evaluation  08/31/19    Authorization Type  medicare    PT Start Time  1534    PT Stop Time  1612    PT Time Calculation (min)  38 min    Activity Tolerance  Patient tolerated treatment well    Behavior During Therapy  [facility] for tasks assessed/performed       Past Medical History:  Diagnosis Date  . Arthritis   . Cancer [facility])    endometrial cancer  . Cellulitis of left lower extremity   . Diabetes mellitus   . Hyperlipemia   . Hypertension   . Non-healing ulcer of lower leg (HCC)    Left leg    . OAB (overactive bladder)   . Obesity    BMI 45    Past Surgical History:  Procedure Laterality Date  . ABDOMINAL HYSTERECTOMY  02/2008   robotic hyst BSO Bilateral LND  . EYE SURGERY     cataract  . IR GENERIC HISTORICAL  09/30/2016   IR EMBO VENOUS NOT HEMORR HEMANG  INC GUIDE ROADMAPPING [facility] ULTRASOUND  . JOINT REPLACEMENT     knee  . KNEE SURGERY     left knee  . LAPAROSCOPIC CHOLECYSTECTOMY SINGLE PORT N/A 07/04/2014   Procedure: LAPAROSCOPIC CHOLECYSTECTOMY SINGLE PORT;  Surgeon: Ardeth Sportsman, MD;  Location: [facility];  Service: General;  Laterality: N/A;  . ROTATOR CUFF REPAIR     right arm  . TUBAL LIGATION      There were no vitals filed for this visit.  Subjective Assessment - 07/15/19 1538    Subjective  I noticed today my tailbone doesn't hurt.  My hip doesn't hurt.  Pt denies pain.  I did all of the exercises yesterday and no problems with any of those.  Pt reports she is walking better today as well.    Currently in Pain?  No/denies                        [facility] Adult PT Treatment/Exercise - 07/15/19 0001      Knee/Hip Exercises: Aerobic   Nustep  Level 3x 8 minutes   PT present to discuss progress     Knee/Hip Exercises: Standing   Heel Raises  Both;20 reps   no UE    Knee Flexion  Strengthening;Right;Left;2 sets;10 reps    Hip Abduction  Stengthening;Both;2 sets;10 reps;Knee straight   Pt requires verbal cues for hip position   Abduction Limitations  red band    Hip Extension  Stengthening;Both;2 sets;10 reps      Knee/Hip Exercises: Seated   Long Arc Quad  Strengthening;Both;2 sets;10 reps    Long Arc Quad Limitations  add 2.5#    Clamshell with TheraBand  Red   2x10   Marching  Strengthening;20 reps    Abd/Adduction Limitations  DF/PF with rocker board - hold in DF - 20x    Sit to [employer]  20 reps;without UE support               PT Short Term Goals -  07/15/19 1540      PT SHORT TERM GOAL #1   Title  ind with initial HEP and doing consistently every day    Status  Achieved        PT Long Term Goals - 07/06/19 1325      PT LONG TERM GOAL #1   Title  ind with advanced HEP    Time  8    Period  Weeks    Status  New    Target Date  08/31/19      PT LONG TERM GOAL #2   Title  TUG < or = to 13 sec for reduced risk of falls    Baseline  21 sec    Time  8    Period  Weeks    Status  New    Target Date  08/31/19      PT LONG TERM GOAL #3   Title  pain reduced by 50% when walking    Baseline  6/10 NPRS    Time  8    Period  Weeks    Status  New    Target Date  08/31/19      PT LONG TERM GOAL #4   Title  Pt will demonstrate 5x sit to stand in < or = to 15 sec without using UE to push on chair    Baseline  26 sec; pushes on chair    Time  8    Period  Weeks    Status  New    Target Date  08/31/19            Plan - 07/15/19 1542    Clinical Impression Statement  Pt is now independent with initial HEP and achieved her initial goal.  She did well with exercises  still needing min-mod cues for keeping pelvis neutral in standing.  She was able to do small calf raise without UE support.  Pt has flexed posture so did some work on lengthening through the hip flexors and emphasized glute squeeze in standing.  Pt will benefit from skilled PT to continue progress towards functional goals.    PT Treatment/Interventions  ADLs/Self Care Home Management;Neuromuscular re-education;Therapeutic activities;Therapeutic exercise;Patient/family education;Manual techniques;Taping;Moist Heat;Electrical Stimulation;Cryotherapy;Passive range of motion    PT Next Visit Plan  postural strength, hip and lumbar stretch, dynamic balance    PT Home Exercise Plan  Access Code: [email or web address]    Consulted and Agree with Plan of Care  Patient       Patient will benefit from skilled therapeutic intervention in order to improve the following deficits and impairments:  Abnormal gait, Decreased strength, Pain, Difficulty walking, Postural dysfunction  Visit Diagnosis: 1. History of falling   2. Chronic bilateral low back pain, unspecified whether sciatica present   3. Abnormal posture        Problem List Patient Active Problem List   Diagnosis Date Noted  . OSA (obstructive sleep apnea) 02/08/2017  . Murmur, cardiac 12/31/2016  . Morbid obesity (HCC) 12/31/2016  . Cellulitis of left leg 07/07/2016  . Venous stasis ulcer of left lower extremity (HCC) 07/07/2016  . Chronic cholecystitis with calculus s/p lap chole 07/04/2014 07/03/2014  . HTN (hypertension) 05/31/2012  . Non-insulin dependent type 2 diabetes mellitus (HCC) 05/31/2012  . Hypercholesteremia 05/31/2012  . OAB (overactive bladder)   . Malignant neoplasm of corpus uteri, except isthmus (HCC) 12/10/2011    Class: Stage 1    Brayton Caves Elizet Kaplan, PT 07/15/2019, 4:18 PM  [facility] [address] [place], [place], [address] Phone: [phone]   Fax:   [phone]  Name: Jenna [name] MRN: [id number] Date of Birth: 13-Jul-1931

## 2019-07-19 ENCOUNTER — Ambulatory Visit: Payer: Medicare Other | Admitting: Physical Therapy

## 2019-07-19 ENCOUNTER — Other Ambulatory Visit: Payer: Self-pay

## 2019-07-19 ENCOUNTER — Encounter: Payer: Self-pay | Admitting: Physical Therapy

## 2019-07-19 DIAGNOSIS — R293 Abnormal posture: Secondary | ICD-10-CM | POA: Diagnosis not present

## 2019-07-19 DIAGNOSIS — M545 Low back pain: Secondary | ICD-10-CM | POA: Diagnosis not present

## 2019-07-19 DIAGNOSIS — Z9181 History of falling: Secondary | ICD-10-CM | POA: Diagnosis not present

## 2019-07-19 DIAGNOSIS — G8929 Other chronic pain: Secondary | ICD-10-CM | POA: Diagnosis not present

## 2019-07-19 NOTE — Therapy (Signed)
[facility] [address] [place], [place], [address] Phone: [phone]   Fax:  [phone]  Physical Therapy Treatment  Patient Details  Name: Jenna [name] MRN: [id number] Date of Birth: 1931-08-21 Referring Provider (PT): Lujean Amel, MD   Encounter Date: 07/19/2019  PT End of Session - 07/19/19 1233    Visit Number  4    Date for PT Re-Evaluation  08/31/19    Authorization Type  medicare    PT Start Time  1230    PT Stop Time  1311    PT Time Calculation (min)  41 min    Activity Tolerance  Patient tolerated treatment well    Behavior During Therapy  [facility] for tasks assessed/performed       Past Medical History:  Diagnosis Date  . Arthritis   . Cancer [facility])    endometrial cancer  . Cellulitis of left lower extremity   . Diabetes mellitus   . Hyperlipemia   . Hypertension   . Non-healing ulcer of lower leg (HCC)    Left leg    . OAB (overactive bladder)   . Obesity    BMI 45    Past Surgical History:  Procedure Laterality Date  . ABDOMINAL HYSTERECTOMY  02/2008   robotic hyst BSO Bilateral LND  . EYE SURGERY     cataract  . IR GENERIC HISTORICAL  09/30/2016   IR EMBO VENOUS NOT HEMORR HEMANG  INC GUIDE ROADMAPPING [facility] ULTRASOUND  . JOINT REPLACEMENT     knee  . KNEE SURGERY     left knee  . LAPAROSCOPIC CHOLECYSTECTOMY SINGLE PORT N/A 07/04/2014   Procedure: LAPAROSCOPIC CHOLECYSTECTOMY SINGLE PORT;  Surgeon: Adin Hector, MD;  Location: [facility];  Service: General;  Laterality: N/A;  . ROTATOR CUFF REPAIR     right arm  . TUBAL LIGATION      There were no vitals filed for this visit.  Subjective Assessment - 07/19/19 1234    Subjective  I feel some arthritis today.  I haven't felt sore since today.    Currently in Pain?  No/denies         [facility] PT Assessment - 07/19/19 0001      Standardized Balance Assessment   Five times sit to stand comments   20 sec and no pushing from  chair      Timed Up and Go Test   Normal TUG (seconds)  19    TUG Comments  no AD and did not push from chair                   [facility] Adult PT Treatment/Exercise - 07/19/19 0001      Neuro Re-ed    Neuro Re-ed Details   foam mat and cues for posture throughout       Lumbar Exercises: Standing   Row  Strengthening;Both;20 reps;Theraband    Theraband Level (Row)  Level 3 (Green)    Shoulder Extension  Strengthening;20 reps;Theraband    Theraband Level (Shoulder Extension)  Level 2 (Red)    Other Standing Lumbar Exercises  standing on foam with head turns and up and down; weight shifting - cues for posture throughout      Knee/Hip Exercises: Aerobic   Nustep  Level 3x 8 minutes; seat and arms 7   PT present to discuss progress     Knee/Hip Exercises: Standing   Heel Raises  Both;20 reps   no UE standing on  foam mat   Hip Abduction  Stengthening;Both;2 sets;10 reps;Knee straight   Pt requires verbal cues for hip position   Abduction Limitations  red band    Hip Extension  Stengthening;Both;2 sets;10 reps      Knee/Hip Exercises: Seated   Long Arc Quad  Strengthening;Both;2 sets;10 reps    Long Arc Quad Limitations  add 3#    Marching  Strengthening;20 reps    Hamstring Curl  Strengthening;Both;20 reps;Limitations    Hamstring Limitations  green band    Sit to [employer]  3 sets;5 reps;without UE support               PT Short Term Goals - 07/15/19 1540      PT SHORT TERM GOAL #1   Title  ind with initial HEP and doing consistently every day    Status  Achieved        PT Long Term Goals - 07/19/19 1240      PT LONG TERM GOAL #1   Title  ind with advanced HEP    Status  On-going      PT LONG TERM GOAL #2   Title  TUG < or = to 13 sec for reduced risk of falls    Baseline  19 sec    Status  On-going      PT LONG TERM GOAL #3   Title  pain reduced by 50% when walking    Baseline  no pain with walking around the house    Status  On-going      PT  LONG TERM GOAL #4   Title  Pt will demonstrate 5x sit to stand in < or = to 15 sec without using UE to push on chair    Baseline  20 sec without pushing on chair    Status  Partially Met            Plan - 07/19/19 1317    Clinical Impression Statement  Pt made progress towards her goals. She performs TUG without AD and 5x sit to stand without pushing from chair as well as demonstrates improved times on both.  Pt was able to add resistance and do more challenging balance exercises.  She will continue to benefit from skilled PT to work towards functional goals.    PT Treatment/Interventions  ADLs/Self Care Home Management;Neuromuscular re-education;Therapeutic activities;Therapeutic exercise;Patient/family education;Manual techniques;Taping;Moist Heat;Electrical Stimulation;Cryotherapy;Passive range of motion    PT Next Visit Plan  postural strength, hip and lumbar stretch, dynamic balance    PT Home Exercise Plan  Access Code: [email or web address]       Patient will benefit from skilled therapeutic intervention in order to improve the following deficits and impairments:  Abnormal gait, Decreased strength, Pain, Difficulty walking, Postural dysfunction  Visit Diagnosis: 1. History of falling   2. Chronic bilateral low back pain, unspecified whether sciatica present   3. Abnormal posture        Problem List Patient Active Problem List   Diagnosis Date Noted  . OSA (obstructive sleep apnea) 02/08/2017  . Murmur, cardiac 12/31/2016  . Morbid obesity ([place]) 12/31/2016  . Cellulitis of left leg 07/07/2016  . Venous stasis ulcer of left lower extremity ([place]) 07/07/2016  . Chronic cholecystitis with calculus s/p lap chole 07/04/2014 07/03/2014  . HTN (hypertension) 05/31/2012  . Non-insulin dependent type 2 diabetes mellitus ([place]) 05/31/2012  . Hypercholesteremia 05/31/2012  . OAB (overactive bladder)   . Malignant neoplasm of corpus uteri, except isthmus ([place]) 12/10/2011  Class: Stage 1     Jule Ser, PT 07/19/2019, 1:21 PM  [facility] [address] [place], [place], [address] Phone: [phone]   Fax:  [phone]  Name: ROSLYNN HOLTE MRN: [id number] Date of Birth: [birth year]

## 2019-07-21 ENCOUNTER — Other Ambulatory Visit: Payer: Self-pay

## 2019-07-21 ENCOUNTER — Ambulatory Visit: Payer: Medicare Other

## 2019-07-21 DIAGNOSIS — G8929 Other chronic pain: Secondary | ICD-10-CM | POA: Diagnosis not present

## 2019-07-21 DIAGNOSIS — Z9181 History of falling: Secondary | ICD-10-CM | POA: Diagnosis not present

## 2019-07-21 DIAGNOSIS — R293 Abnormal posture: Secondary | ICD-10-CM

## 2019-07-21 DIAGNOSIS — M545 Low back pain: Secondary | ICD-10-CM | POA: Diagnosis not present

## 2019-07-21 NOTE — Therapy (Signed)
[facility] [address] [place], [place], [address] Phone: [phone]   Fax:  [phone]  Physical Therapy Treatment  Patient Details  Name: CARISMA TROUPE MRN: [id number] Date of Birth: 05/15/1931 Referring Provider (PT): Lujean Amel, MD   Encounter Date: 07/21/2019  PT End of Session - 07/21/19 1305    Visit Number  5    Date for PT Re-Evaluation  08/31/19    Authorization Type  medicare    PT Start Time  1230    PT Stop Time  1310    PT Time Calculation (min)  40 min    Activity Tolerance  Patient tolerated treatment well    Behavior During Therapy  [facility] for tasks assessed/performed       Past Medical History:  Diagnosis Date  . Arthritis   . Cancer [facility])    endometrial cancer  . Cellulitis of left lower extremity   . Diabetes mellitus   . Hyperlipemia   . Hypertension   . Non-healing ulcer of lower leg (HCC)    Left leg    . OAB (overactive bladder)   . Obesity    BMI 45    Past Surgical History:  Procedure Laterality Date  . ABDOMINAL HYSTERECTOMY  02/2008   robotic hyst BSO Bilateral LND  . EYE SURGERY     cataract  . IR GENERIC HISTORICAL  09/30/2016   IR EMBO VENOUS NOT HEMORR HEMANG  INC GUIDE ROADMAPPING [facility] ULTRASOUND  . JOINT REPLACEMENT     knee  . KNEE SURGERY     left knee  . LAPAROSCOPIC CHOLECYSTECTOMY SINGLE PORT N/A 07/04/2014   Procedure: LAPAROSCOPIC CHOLECYSTECTOMY SINGLE PORT;  Surgeon: Adin Hector, MD;  Location: [facility];  Service: General;  Laterality: N/A;  . ROTATOR CUFF REPAIR     right arm  . TUBAL LIGATION      There were no vitals filed for this visit.  Subjective Assessment - 07/21/19 1230    Subjective  I'm doing OK.  I feel a little stiff.    Currently in Pain?  No/denies                       [facility] Adult PT Treatment/Exercise - 07/21/19 0001      Neuro Re-ed    Neuro Re-ed Details   foam mat and cues for posture throughout        Lumbar Exercises: Standing   Row  Strengthening;Both;20 reps;Theraband    Theraband Level (Row)  Level 3 (Green)    Shoulder Extension  Strengthening;20 reps;Theraband    Theraband Level (Shoulder Extension)  Level 3 (Green)    Other Standing Lumbar Exercises  standing on foam with head turns and up and down; weight shifting - cues for posture throughout      Knee/Hip Exercises: Aerobic   Nustep  Level 3x 8 minutes; seat and arms 7   PT present to discuss progress     Knee/Hip Exercises: Standing   Heel Raises  Both;20 reps   no UE standing on foam mat   Knee Flexion  Strengthening;Right;Left;2 sets;10 reps    Hip Abduction  Stengthening;Both;2 sets;10 reps;Knee straight   Pt requires verbal cues for hip position   Abduction Limitations  3#    Hip Extension  Stengthening;Both;2 sets;10 reps    Extension Limitations  3#     Rocker Board  3 minutes      Knee/Hip Exercises: Seated  Long Arc [employer]  Strengthening;Both;2 sets;10 reps    Long [employer] Limitations  add 3#    Marching  Strengthening;20 reps;Weights    [employer]  3 lbs.    Hamstring Curl  Strengthening;Both;20 reps;Limitations    Hamstring Limitations  green band    Sit to Sand  3 sets;5 reps;without UE support               PT Short Term Goals - 07/21/19 1231      PT SHORT TERM GOAL #1   Title  ind with initial HEP and doing consistently every day    Status  Achieved        PT Long Term Goals - 07/21/19 1231      PT LONG TERM GOAL #1   Title  ind with advanced HEP    Time  8    Period  Weeks    Status  On-going      PT LONG TERM GOAL #2   Title  TUG < or = to 13 sec for reduced risk of falls    Baseline  19 sec    Time  8    Period  Weeks    Status  On-going      PT LONG TERM GOAL #4   Title  Pt will demonstrate 5x sit to stand in < or = to 15 sec without using UE to push on chair    Baseline  20 sec without pushing on chair    Time  8    Status  On-going             Plan - 07/21/19 1234    Clinical Impression Statement  Pt is using her cane today due to feeling less stable. Pt made progress towards her goals this week with improved time with TUG and 5x sit to stand with reduced use of UEs. Pt was able to add resistance and do more challenging balance exercises this week.  PT provided close supervision and stand by assistance to provide verbal cues for technique and for safety.  She will continue to benefit from skilled PT to work towards functional goals to improve endurance, safety and stability at home and in the community.    PT Frequency  2x / week    PT Duration  8 weeks    PT Treatment/Interventions  ADLs/Self Care Home Management;Neuromuscular re-education;Therapeutic activities;Therapeutic exercise;Patient/family education;Manual techniques;Taping;Moist Heat;Electrical Stimulation;Cryotherapy;Passive range of motion    PT Next Visit Plan  postural strength, hip and lumbar stretch, dynamic balance    PT Home Exercise Plan  Access Code: [email or web address]    Consulted and Agree with Plan of Care  Patient       Patient will benefit from skilled therapeutic intervention in order to improve the following deficits and impairments:  Abnormal gait, Decreased strength, Pain, Difficulty walking, Postural dysfunction  Visit Diagnosis: 1. Chronic bilateral low back pain, unspecified whether sciatica present   2. History of falling   3. Abnormal posture        Problem List Patient Active Problem List   Diagnosis Date Noted  . OSA (obstructive sleep apnea) 02/08/2017  . Murmur, cardiac 12/31/2016  . Morbid obesity ([place]) 12/31/2016  . Cellulitis of left leg 07/07/2016  . Venous stasis ulcer of left lower extremity ([place]) 07/07/2016  . Chronic cholecystitis with calculus s/p lap chole 07/04/2014 07/03/2014  . HTN (hypertension) 05/31/2012  . Non-insulin dependent type 2 diabetes mellitus ([place]) 05/31/2012  . Hypercholesteremia 05/31/2012  .  OAB  (overactive bladder)   . Malignant neoplasm of corpus uteri, except isthmus ([place]) 12/10/2011    Class: Stage 1    Sigurd Sos, PT 07/21/19 1:18 PM  [facility] [address], [place], [place], [address] Phone: [phone]   Fax:  [phone]  Name: SOFIJA ANTWI MRN: [id number] Date of Birth: November 14, 1931

## 2019-07-27 ENCOUNTER — Other Ambulatory Visit: Payer: Self-pay

## 2019-07-27 ENCOUNTER — Ambulatory Visit: Payer: Medicare Other

## 2019-07-27 DIAGNOSIS — Z9181 History of falling: Secondary | ICD-10-CM

## 2019-07-27 DIAGNOSIS — G8929 Other chronic pain: Secondary | ICD-10-CM

## 2019-07-27 DIAGNOSIS — R293 Abnormal posture: Secondary | ICD-10-CM

## 2019-07-27 NOTE — Therapy (Signed)
[facility] [address], [place], [place], [address] Phone: [phone]   Fax:  [phone]  Physical Therapy Treatment  Patient Details  Name: Jenna [name] MRN: [id number] Date of Birth: [birth year] Referring Provider (PT): Lujean Amel, MD   Encounter Date: 07/27/2019  PT End of Session - 07/27/19 1313    Visit Number  6    Date for PT Re-Evaluation  08/31/19    Authorization Type  medicare    PT Start Time  1232    PT Stop Time  1315    PT Time Calculation (min)  43 min    Activity Tolerance  Patient tolerated treatment well    Behavior During Therapy  [facility] for tasks assessed/performed       Past Medical History:  Diagnosis Date  . Arthritis   . Cancer [facility])    endometrial cancer  . Cellulitis of left lower extremity   . Diabetes mellitus   . Hyperlipemia   . Hypertension   . Non-healing ulcer of lower leg (HCC)    Left leg    . OAB (overactive bladder)   . Obesity    BMI 45    Past Surgical History:  Procedure Laterality Date  . ABDOMINAL HYSTERECTOMY  02/2008   robotic hyst BSO Bilateral LND  . EYE SURGERY     cataract  . IR GENERIC HISTORICAL  09/30/2016   IR EMBO VENOUS NOT HEMORR HEMANG  INC GUIDE ROADMAPPING [facility] ULTRASOUND  . JOINT REPLACEMENT     knee  . KNEE SURGERY     left knee  . LAPAROSCOPIC CHOLECYSTECTOMY SINGLE PORT N/A 07/04/2014   Procedure: LAPAROSCOPIC CHOLECYSTECTOMY SINGLE PORT;  Surgeon: Adin Hector, MD;  [facility];  Service: General;  Laterality: N/A;  . ROTATOR CUFF REPAIR     right arm  . TUBAL LIGATION      There were no vitals filed for this visit.  Subjective Assessment - 07/27/19 1239    Subjective  I am feeling a little bit sore but doing ok otherwise.    Currently in Pain?  No/denies         [facility] PT Assessment - 07/27/19 0001      Timed Up and Go Test   Normal TUG (seconds)  15    TUG Comments  no AD, did use arms of chair                    [facility] Adult PT Treatment/Exercise - 07/27/19 0001      Neuro Re-ed    Neuro Re-ed Details   foam mat and cues for posture throughout       Lumbar Exercises: Standing   Row  Strengthening;Both;20 reps;Theraband    Theraband Level (Row)  Level 3 (Green)    Shoulder Extension  Strengthening;20 reps;Theraband    Theraband Level (Shoulder Extension)  Level 3 (Green)      Knee/Hip Exercises: Aerobic   Nustep  Level 3x 8 minutes; seat and arms 7   PT present to discuss progress     Knee/Hip Exercises: Standing   Heel Raises  Both;20 reps   no UE standing on foam mat   Knee Flexion  Strengthening;Right;Left;2 sets;10 reps    Knee Flexion Limitations  3# added    Hip Abduction  Stengthening;Both;2 sets;10 reps;Knee straight   Pt requires verbal cues for hip position   Abduction Limitations  3#    Hip Extension  Stengthening;Both;2 sets;10  reps    Extension Limitations  3#     Rocker Board  3 minutes      Knee/Hip Exercises: Seated   Long Arc Quad  Strengthening;Both;2 sets;10 reps    Long Arc Quad Limitations  add 3#    Marching  Strengthening;20 reps;Weights    [employer]  3 lbs.    Abd/Adduction Limitations  ball squeeze: 5 second hold x 20    Sit to Sand  3 sets;5 reps;without UE support               PT Short Term Goals - 07/21/19 1231      PT SHORT TERM GOAL #1   Title  ind with initial HEP and doing consistently every day    Status  Achieved        PT Long Term Goals - 07/27/19 1307      PT LONG TERM GOAL #2   Title  TUG < or = to 13 sec for reduced risk of falls    Baseline  15 sec    Time  8    Period  Weeks    Status  On-going            Plan - 07/27/19 1255    Clinical Impression Statement  Pt continues to tolerate and participate in moderate exercise with weights in the clinic.  Pt denies any LBP in standing at home with home tasks or with exercise today.  Pt provided tactile and verbal cues for hip alignment  with standing exercise as she externally rotates with hip abduction and extension. TUG is improved to 15 seconds today indicating a lower falls risk. Pt will continue to benefit from skilled PT to address strength, endurance and balance.    PT Frequency  2x / week    PT Duration  8 weeks    PT Treatment/Interventions  ADLs/Self Care Home Management;Neuromuscular re-education;Therapeutic activities;Therapeutic exercise;Patient/family education;Manual techniques;Taping;Moist Heat;Electrical Stimulation;Cryotherapy;Passive range of motion    PT Next Visit Plan  postural strength, hip and lumbar stretch, dynamic balance    PT Home Exercise Plan  Access Code: [email or web address]    Consulted and Agree with Plan of Care  Patient       Patient will benefit from skilled therapeutic intervention in order to improve the following deficits and impairments:  Abnormal gait, Decreased strength, Pain, Difficulty walking, Postural dysfunction  Visit Diagnosis: 1. History of falling   2. Abnormal posture   3. Chronic bilateral low back pain, unspecified whether sciatica present        Problem List Patient Active Problem List   Diagnosis Date Noted  . OSA (obstructive sleep apnea) 02/08/2017  . Murmur, cardiac 12/31/2016  . Morbid obesity ([place]) 12/31/2016  . Cellulitis of left leg 07/07/2016  . Venous stasis ulcer of left lower extremity ([place]) 07/07/2016  . Chronic cholecystitis with calculus s/p lap chole 07/04/2014 07/03/2014  . HTN (hypertension) 05/31/2012  . Non-insulin dependent type 2 diabetes mellitus ([place]) 05/31/2012  . Hypercholesteremia 05/31/2012  . OAB (overactive bladder)   . Malignant neoplasm of corpus uteri, except isthmus ([place]) 12/10/2011    Class: Stage 1    Sigurd Sos, PT 07/27/19 1:23 PM  [facility] [address] [place], [place], [address] Phone: [phone]   Fax:  [phone]  Name: Jenna [name] MRN:  [id number] Date of Birth: 12-01-1931

## 2019-07-29 ENCOUNTER — Encounter: Payer: Self-pay | Admitting: Physical Therapy

## 2019-07-29 ENCOUNTER — Other Ambulatory Visit: Payer: Self-pay

## 2019-07-29 ENCOUNTER — Ambulatory Visit: Payer: Medicare Other | Admitting: Physical Therapy

## 2019-07-29 DIAGNOSIS — R293 Abnormal posture: Secondary | ICD-10-CM | POA: Diagnosis not present

## 2019-07-29 DIAGNOSIS — Z9181 History of falling: Secondary | ICD-10-CM | POA: Diagnosis not present

## 2019-07-29 DIAGNOSIS — M545 Low back pain: Secondary | ICD-10-CM | POA: Diagnosis not present

## 2019-07-29 DIAGNOSIS — G8929 Other chronic pain: Secondary | ICD-10-CM | POA: Diagnosis not present

## 2019-07-29 NOTE — Therapy (Signed)
[facility] [address] [place], [place], [address] Phone: [phone]   Fax:  [phone]  Physical Therapy Treatment  Patient Details  Name: Jenna [name] MRN: [id number] Date of Birth: 12-09-1931 Referring Provider (PT): Lujean Amel, MD   Encounter Date: 07/29/2019  PT End of Session - 07/29/19 1236    Visit Number  7    Date for PT Re-Evaluation  08/31/19    Authorization Type  medicare    PT Start Time  1232    PT Stop Time  1315    PT Time Calculation (min)  43 min    Activity Tolerance  Patient tolerated treatment well    Behavior During Therapy  [facility] for tasks assessed/performed       Past Medical History:  Diagnosis Date  . Arthritis   . Cancer [facility])    endometrial cancer  . Cellulitis of left lower extremity   . Diabetes mellitus   . Hyperlipemia   . Hypertension   . Non-healing ulcer of lower leg (HCC)    Left leg    . OAB (overactive bladder)   . Obesity    BMI 45    Past Surgical History:  Procedure Laterality Date  . ABDOMINAL HYSTERECTOMY  02/2008   robotic hyst BSO Bilateral LND  . EYE SURGERY     cataract  . IR GENERIC HISTORICAL  09/30/2016   IR EMBO VENOUS NOT HEMORR HEMANG  INC GUIDE ROADMAPPING [facility] ULTRASOUND  . JOINT REPLACEMENT     knee  . KNEE SURGERY     left knee  . LAPAROSCOPIC CHOLECYSTECTOMY SINGLE PORT N/A 07/04/2014   Procedure: LAPAROSCOPIC CHOLECYSTECTOMY SINGLE PORT;  Surgeon: Adin Hector, MD;  [facility];  Service: General;  Laterality: N/A;  . ROTATOR CUFF REPAIR     right arm  . TUBAL LIGATION      There were no vitals filed for this visit.  Subjective Assessment - 07/29/19 1234    Subjective  I woke up sore today, but it feels like muscle soreness from the other day.  Pt denies pain.  Pt states she felt okay yesterday.  Today she enters clinic using cane    Currently in Pain?  No/denies                       [facility] Adult  PT Treatment/Exercise - 07/29/19 0001      Neuro Re-ed    Neuro Re-ed Details   foam mat and cues for posture throughout       Lumbar Exercises: Standing   Row  Strengthening;Both;20 reps;Theraband    Theraband Level (Row)  Level 3 (Green)    Shoulder Extension  Strengthening;20 reps;Theraband    Theraband Level (Shoulder Extension)  Level 3 (Green)      Knee/Hip Exercises: Aerobic   Nustep  Level 3x 8 minutes; seat and arms 7   PT present to discuss progress     Knee/Hip Exercises: Standing   Heel Raises  Both;20 reps   no UE standing on foam mat   Knee Flexion  Strengthening;Right;Left;2 sets;10 reps    Knee Flexion Limitations  3#    Hip Abduction  Stengthening;Both;2 sets;10 reps;Knee straight   Pt requires verbal cues for hip positionsta   Abduction Limitations  3#    Hip Extension  Stengthening;Both;2 sets;10 reps    Extension Limitations  3#     Rocker Board  3 minutes  Knee/Hip Exercises: Seated   Long Arc Quad  Strengthening;Both;2 sets;10 reps    Long Arc Quad Limitations  3#    Clamshell with TheraBand  Blue   2x10   Marching  Strengthening;20 reps;Weights    [employer]  3 lbs.    Hamstring Curl  Strengthening;Both;20 reps;Limitations    Hamstring Limitations  blue band    Abd/Adduction Limitations  ball squeeze: 5 second hold x 20    Sit to Sand  without UE support;2 sets;10 reps   1 set with blue band around knees              PT Short Term Goals - 07/21/19 1231      PT SHORT TERM GOAL #1   Title  ind with initial HEP and doing consistently every day    Status  Achieved        PT Long Term Goals - 07/27/19 1307      PT LONG TERM GOAL #2   Title  TUG < or = to 13 sec for reduced risk of falls    Baseline  15 sec    Time  8    Period  Weeks    Status  On-going            Plan - 07/29/19 1310    Clinical Impression Statement  Pt was able to tolerate moderate exercises with slight increase of resistance.  PT did not  increase resistance much due to having muscle soreness from previous session.  Pt denied pain with exercises today and felt good after treatment.  Pt continues to need cues for upright posture during standing activities especially on more challenging surfaces.  pt will benefit from skilled PT to continue to progress strength, posture and balance.    PT Treatment/Interventions  ADLs/Self Care Home Management;Neuromuscular re-education;Therapeutic activities;Therapeutic exercise;Patient/family education;Manual techniques;Taping;Moist Heat;Electrical Stimulation;Cryotherapy;Passive range of motion    PT Next Visit Plan  postural strength, hip and lumbar stretch, dynamic balance    PT Home Exercise Plan  Access Code: [email or web address]    Consulted and Agree with Plan of Care  Patient       Patient will benefit from skilled therapeutic intervention in order to improve the following deficits and impairments:  Abnormal gait, Decreased strength, Pain, Difficulty walking, Postural dysfunction  Visit Diagnosis: 1. History of falling   2. Abnormal posture   3. Chronic bilateral low back pain, unspecified whether sciatica present        Problem List Patient Active Problem List   Diagnosis Date Noted  . OSA (obstructive sleep apnea) 02/08/2017  . Murmur, cardiac 12/31/2016  . Morbid obesity ([place]) 12/31/2016  . Cellulitis of left leg 07/07/2016  . Venous stasis ulcer of left lower extremity ([place]) 07/07/2016  . Chronic cholecystitis with calculus s/p lap chole 07/04/2014 07/03/2014  . HTN (hypertension) 05/31/2012  . Non-insulin dependent type 2 diabetes mellitus ([place]) 05/31/2012  . Hypercholesteremia 05/31/2012  . OAB (overactive bladder)   . Malignant neoplasm of corpus uteri, except isthmus ([place]) 12/10/2011    Class: Stage 1    Jule Ser, PT 07/29/2019, 1:17 PM  [facility] [address] [place], [place], [address] Phone:  [phone]   Fax:  [phone]  Name: Jenna [name] MRN: [id number] Date of Birth: July 26, 1931

## 2019-08-02 ENCOUNTER — Other Ambulatory Visit: Payer: Self-pay

## 2019-08-02 ENCOUNTER — Encounter: Payer: Self-pay | Admitting: Physical Therapy

## 2019-08-02 ENCOUNTER — Ambulatory Visit: Payer: Medicare Other | Attending: Family Medicine | Admitting: Physical Therapy

## 2019-08-02 DIAGNOSIS — M545 Low back pain: Secondary | ICD-10-CM | POA: Diagnosis not present

## 2019-08-02 DIAGNOSIS — Z9181 History of falling: Secondary | ICD-10-CM | POA: Insufficient documentation

## 2019-08-02 DIAGNOSIS — G8929 Other chronic pain: Secondary | ICD-10-CM

## 2019-08-02 DIAGNOSIS — R293 Abnormal posture: Secondary | ICD-10-CM | POA: Diagnosis not present

## 2019-08-02 NOTE — Therapy (Signed)
[facility] [address] [place], [place], [address] Phone: [phone]   Fax:  [phone]  Physical Therapy Treatment  Patient Details  Name: Jenna [name] MRN: [id number] Date of Birth: 11-Feb-1931 Referring Provider (PT): Lujean Amel, MD   Encounter Date: 08/02/2019  PT End of Session - 08/02/19 1237    Visit Number  8    Date for PT Re-Evaluation  08/31/19    Authorization Type  medicare    PT Start Time  1234    PT Stop Time  1312    PT Time Calculation (min)  38 min    Activity Tolerance  Patient tolerated treatment well    Behavior During Therapy  [facility] for tasks assessed/performed       Past Medical History:  Diagnosis Date  . Arthritis   . Cancer [facility])    endometrial cancer  . Cellulitis of left lower extremity   . Diabetes mellitus   . Hyperlipemia   . Hypertension   . Non-healing ulcer of lower leg (HCC)    Left leg    . OAB (overactive bladder)   . Obesity    BMI 45    Past Surgical History:  Procedure Laterality Date  . ABDOMINAL HYSTERECTOMY  02/2008   robotic hyst BSO Bilateral LND  . EYE SURGERY     cataract  . IR GENERIC HISTORICAL  09/30/2016   IR EMBO VENOUS NOT HEMORR HEMANG  INC GUIDE ROADMAPPING [facility] ULTRASOUND  . JOINT REPLACEMENT     knee  . KNEE SURGERY     left knee  . LAPAROSCOPIC CHOLECYSTECTOMY SINGLE PORT N/A 07/04/2014   Procedure: LAPAROSCOPIC CHOLECYSTECTOMY SINGLE PORT;  Surgeon: Adin Hector, MD;  [facility];  Service: General;  Laterality: N/A;  . ROTATOR CUFF REPAIR     right arm  . TUBAL LIGATION      There were no vitals filed for this visit.  Subjective Assessment - 08/02/19 1254    Subjective  Pt states she was sore on the Saturday after her treatment.  Pt denies pain today    Currently in Pain?  No/denies                       [facility] Adult PT Treatment/Exercise - 08/02/19 0001      Lumbar Exercises: Standing   Row   Strengthening;Both;20 reps;Theraband    Theraband Level (Row)  Level 4 (Blue)    Shoulder Extension  Strengthening;20 reps;Theraband    Theraband Level (Shoulder Extension)  Level 4 (Blue)      Knee/Hip Exercises: Aerobic   Nustep  Level 3x 8 minutes; seat and arms 7   PT present to discuss progress     Knee/Hip Exercises: Standing   Heel Raises  Both;20 reps   no UE standing on foam mat   Knee Flexion  Strengthening;Right;Left;2 sets;10 reps    Knee Flexion Limitations  3#    Hip Abduction  Stengthening;Both;2 sets;10 reps;Knee straight   Pt requires verbal cues for hip positionsta   Abduction Limitations  3#    Hip Extension  Stengthening;Both;2 sets;10 reps    Extension Limitations  3#     Rocker Board  3 minutes    Gait Training  stepping over half foam roll - 10x; side step over 5lb weight 10x each side      Knee/Hip Exercises: Seated   Long Arc Quad  Strengthening;Both;2 sets;10 reps    Long  Arc Quad Limitations  3#    Clamshell with TheraBand  Blue   2x10   Sit to [employer]  without UE support;2 sets;10 reps   1 set with blue band around knees              PT Short Term Goals - 07/21/19 1231      PT SHORT TERM GOAL #1   Title  ind with initial HEP and doing consistently every day    Status  Achieved        PT Long Term Goals - 07/27/19 1307      PT LONG TERM GOAL #2   Title  TUG < or = to 13 sec for reduced risk of falls    Baseline  15 sec    Time  8    Period  Weeks    Status  On-going            Plan - 08/02/19 1256    Clinical Impression Statement  Pt did well with exercises today.  Pt tolerated slight increases in difficutly as mentioned above.  Pt will continue to benefit from skilled PT to progress strength and stability for more safe functional activities.    PT Treatment/Interventions  ADLs/Self Care Home Management;Neuromuscular re-education;Therapeutic activities;Therapeutic exercise;Patient/family education;Manual  techniques;Taping;Moist Heat;Electrical Stimulation;Cryotherapy;Passive range of motion    PT Next Visit Plan  postural strength, hip and lumbar stretch, dynamic balance    PT Home Exercise Plan  Access Code: [email or web address]    Consulted and Agree with Plan of Care  Patient       Patient will benefit from skilled therapeutic intervention in order to improve the following deficits and impairments:  Abnormal gait, Decreased strength, Pain, Difficulty walking, Postural dysfunction  Visit Diagnosis: 1. History of falling   2. Abnormal posture   3. Chronic bilateral low back pain, unspecified whether sciatica present        Problem List Patient Active Problem List   Diagnosis Date Noted  . OSA (obstructive sleep apnea) 02/08/2017  . Murmur, cardiac 12/31/2016  . Morbid obesity ([place]) 12/31/2016  . Cellulitis of left leg 07/07/2016  . Venous stasis ulcer of left lower extremity ([place]) 07/07/2016  . Chronic cholecystitis with calculus s/p lap chole 07/04/2014 07/03/2014  . HTN (hypertension) 05/31/2012  . Non-insulin dependent type 2 diabetes mellitus ([place]) 05/31/2012  . Hypercholesteremia 05/31/2012  . OAB (overactive bladder)   . Malignant neoplasm of corpus uteri, except isthmus ([place]) 12/10/2011    Class: Stage 1    Jule Ser, PT 08/02/2019, 1:07 PM  [facility] [address] [place], [place], [address] Phone: [phone]   Fax:  [phone]  Name: Jenna [name] MRN: [id number] Date of Birth: September 06, 1931

## 2019-08-04 ENCOUNTER — Ambulatory Visit: Payer: Medicare Other | Admitting: Physical Therapy

## 2019-08-09 ENCOUNTER — Ambulatory Visit: Payer: Medicare Other

## 2019-08-09 ENCOUNTER — Other Ambulatory Visit: Payer: Self-pay

## 2019-08-09 DIAGNOSIS — Z9181 History of falling: Secondary | ICD-10-CM

## 2019-08-09 DIAGNOSIS — G8929 Other chronic pain: Secondary | ICD-10-CM

## 2019-08-09 DIAGNOSIS — R293 Abnormal posture: Secondary | ICD-10-CM

## 2019-08-09 DIAGNOSIS — M545 Low back pain: Secondary | ICD-10-CM | POA: Diagnosis not present

## 2019-08-09 NOTE — Therapy (Signed)
[facility] [address], [place], [place], [address] Phone: [phone]   Fax:  [phone]  Physical Therapy Treatment  Patient Details  Name: Jenna [name] MRN: [id number] Date of Birth: 1931-09-22 Referring Provider (PT): Lujean Amel, MD   Encounter Date: 08/09/2019  PT End of Session - 08/09/19 1310    Visit Number  9    Date for PT Re-Evaluation  08/31/19    Authorization Type  medicare    PT Start Time  1231    PT Stop Time  1311    PT Time Calculation (min)  40 min    Activity Tolerance  Patient tolerated treatment well    Behavior During Therapy  [facility] for tasks assessed/performed       Past Medical History:  Diagnosis Date  . Arthritis   . Cancer [facility])    endometrial cancer  . Cellulitis of left lower extremity   . Diabetes mellitus   . Hyperlipemia   . Hypertension   . Non-healing ulcer of lower leg (HCC)    Left leg    . OAB (overactive bladder)   . Obesity    BMI 45    Past Surgical History:  Procedure Laterality Date  . ABDOMINAL HYSTERECTOMY  02/2008   robotic hyst BSO Bilateral LND  . EYE SURGERY     cataract  . IR GENERIC HISTORICAL  09/30/2016   IR EMBO VENOUS NOT HEMORR HEMANG  INC GUIDE ROADMAPPING [facility] ULTRASOUND  . JOINT REPLACEMENT     knee  . KNEE SURGERY     left knee  . LAPAROSCOPIC CHOLECYSTECTOMY SINGLE PORT N/A 07/04/2014   Procedure: LAPAROSCOPIC CHOLECYSTECTOMY SINGLE PORT;  Surgeon: Adin Hector, MD;  Location: [facility];  Service: General;  Laterality: N/A;  . ROTATOR CUFF REPAIR     right arm  . TUBAL LIGATION      There were no vitals filed for this visit.  Subjective Assessment - 08/09/19 1235    Subjective  I feel like I am walking better.  I walked for 10-15 minutes outside this weekend.  I felt tired after I walked.    Currently in Pain?  No/denies         [facility] PT Assessment - 08/09/19 0001      Transfers   Five time sit to stand comments    21.26 seconds without hands                   [facility] Adult PT Treatment/Exercise - 08/09/19 0001      Lumbar Exercises: Standing   Row  Strengthening;Both;20 reps;Theraband    Theraband Level (Row)  Level 4 (Blue)    Shoulder Extension  Strengthening;20 reps;Theraband    Theraband Level (Shoulder Extension)  Level 4 (Blue)      Knee/Hip Exercises: Aerobic   Nustep  Level 3x 8 minutes; seat and arms 7   PT present to discuss progress     Knee/Hip Exercises: Standing   Heel Raises  Both;20 reps   no UE standing on foam mat   Knee Flexion  Strengthening;Right;Left;2 sets;10 reps    Knee Flexion Limitations  3#    Hip Abduction  Stengthening;Both;2 sets;10 reps;Knee straight   Pt requires verbal cues for hip position   Abduction Limitations  3#    Hip Extension  Stengthening;Both;2 sets;10 reps    Extension Limitations  3#     Rocker Board  3 minutes    Other Standing  Knee Exercises  weight shifting on balance pad: 3 ways with min UE support x 1 minute each      Knee/Hip Exercises: Seated   Long Arc Quad  Strengthening;Both;2 sets;10 reps    Long Arc Quad Limitations  3#    Sit to [employer]  without UE support;2 sets;10 reps               PT Short Term Goals - 07/21/19 1231      PT SHORT TERM GOAL #1   Title  ind with initial HEP and doing consistently every day    Status  Achieved        PT Long Term Goals - 08/09/19 1241      PT LONG TERM GOAL #1   Title  ind with advanced HEP    Time  8    Status  On-going      PT LONG TERM GOAL #3   Title  pain reduced by 50% when walking    Baseline  no pain with walking    Status  Achieved      PT LONG TERM GOAL #4   Title  Pt will demonstrate 5x sit to stand in < or = to 15 sec without using UE to push on chair    Baseline  21.26 seconds    Time  8    Period  Weeks    Status  On-going            Plan - 08/09/19 1250    Clinical Impression Statement  Pt is making steady progress with gait, balance  and strength.  Pt demonstrates increased ease with sit to stand transition with reduced UE assistance.  5x sit to stand is 21.26 indicating a falls risk.  Pt reports that getting in and out of chair, bed and shower are all easier and she feels steadier.  Pt requires stand by assistance for safety and cueing for technique.  Pt will continue to benefit from skilled PT to improve strength, endurance and balance to improve safety at home and in the community.    PT Frequency  2x / week    PT Duration  8 weeks    PT Treatment/Interventions  ADLs/Self Care Home Management;Neuromuscular re-education;Therapeutic activities;Therapeutic exercise;Patient/family education;Manual techniques;Taping;Moist Heat;Electrical Stimulation;Cryotherapy;Passive range of motion    PT Next Visit Plan  postural strength, hip and lumbar stretch, dynamic balance    PT Home Exercise Plan  Access Code: [email or web address]    Consulted and Agree with Plan of Care  Patient       Patient will benefit from skilled therapeutic intervention in order to improve the following deficits and impairments:  Abnormal gait, Decreased strength, Pain, Difficulty walking, Postural dysfunction  Visit Diagnosis: 1. Abnormal posture   2. Chronic bilateral low back pain, unspecified whether sciatica present   3. History of falling        Problem List Patient Active Problem List   Diagnosis Date Noted  . OSA (obstructive sleep apnea) 02/08/2017  . Murmur, cardiac 12/31/2016  . Morbid obesity ([place]) 12/31/2016  . Cellulitis of left leg 07/07/2016  . Venous stasis ulcer of left lower extremity ([place]) 07/07/2016  . Chronic cholecystitis with calculus s/p lap chole 07/04/2014 07/03/2014  . HTN (hypertension) 05/31/2012  . Non-insulin dependent type 2 diabetes mellitus ([place]) 05/31/2012  . Hypercholesteremia 05/31/2012  . OAB (overactive bladder)   . Malignant neoplasm of corpus uteri, except isthmus ([place]) 12/10/2011    Class: Stage 1  Sigurd Sos,  PT 08/09/19 1:12 PM  [facility] [address], [place], [place], [address] Phone: [phone]   Fax:  [phone]  Name: Jenna [name] MRN: [id number] Date of Birth: [birth year]

## 2019-08-11 ENCOUNTER — Ambulatory Visit: Payer: Medicare Other

## 2019-08-11 ENCOUNTER — Other Ambulatory Visit: Payer: Self-pay

## 2019-08-11 DIAGNOSIS — G8929 Other chronic pain: Secondary | ICD-10-CM | POA: Diagnosis not present

## 2019-08-11 DIAGNOSIS — Z9181 History of falling: Secondary | ICD-10-CM | POA: Diagnosis not present

## 2019-08-11 DIAGNOSIS — R293 Abnormal posture: Secondary | ICD-10-CM | POA: Diagnosis not present

## 2019-08-11 DIAGNOSIS — M545 Low back pain: Secondary | ICD-10-CM | POA: Diagnosis not present

## 2019-08-11 NOTE — Therapy (Signed)
[facility] [address] [place], [place], [address] Phone: [phone]   Fax:  [phone]  Physical Therapy Treatment  Patient Details  Name: Jenna [name] MRN: [id number] Date of Birth: September 28, 1931 Referring Provider (PT): Lujean Amel, MD   Encounter Date: 08/11/2019 Progress Note Reporting Period 07/06/19 to 08/11/2019  See note below for Objective Data and Assessment of Progress/Goals.      PT End of Session - 08/11/19 1257    Visit Number  10    Date for PT Re-Evaluation  08/31/19    Authorization Type  medicare    PT Start Time  1215    PT Stop Time  1257    PT Time Calculation (min)  42 min    Activity Tolerance  Patient tolerated treatment well    Behavior During Therapy  [facility] for tasks assessed/performed       Past Medical History:  Diagnosis Date  . Arthritis   . Cancer [facility])    endometrial cancer  . Cellulitis of left lower extremity   . Diabetes mellitus   . Hyperlipemia   . Hypertension   . Non-healing ulcer of lower leg (HCC)    Left leg    . OAB (overactive bladder)   . Obesity    BMI 45    Past Surgical History:  Procedure Laterality Date  . ABDOMINAL HYSTERECTOMY  02/2008   robotic hyst BSO Bilateral LND  . EYE SURGERY     cataract  . IR GENERIC HISTORICAL  09/30/2016   IR EMBO VENOUS NOT HEMORR HEMANG  INC GUIDE ROADMAPPING [facility] ULTRASOUND  . JOINT REPLACEMENT     knee  . KNEE SURGERY     left knee  . LAPAROSCOPIC CHOLECYSTECTOMY SINGLE PORT N/A 07/04/2014   Procedure: LAPAROSCOPIC CHOLECYSTECTOMY SINGLE PORT;  Surgeon: Adin Hector, MD;  Location: [facility];  Service: General;  Laterality: N/A;  . ROTATOR CUFF REPAIR     right arm  . TUBAL LIGATION      There were no vitals filed for this visit.  Subjective Assessment - 08/11/19 1259    Subjective  It is easier to get up and down from a chair.    Currently in Pain?  No/denies          [facility] PT Assessment -  08/11/19 0001      Assessment   Medical Diagnosis  W19.XXXA (ICD-10-CM) - Unspecified fall, initial encounter; M25.551 (ICD-10-CM) - Pain in right hip      Transfers   Five time sit to stand comments   18.96 seconds with minimal to no use of hands      Timed Up and Go Test   TUG  Normal TUG    Normal TUG (seconds)  12.97    TUG Comments  no cane, used arms on chair                   [facility] Adult PT Treatment/Exercise - 08/11/19 0001      Lumbar Exercises: Standing   Row  Strengthening;Both;20 reps;Theraband    Theraband Level (Row)  Level 4 (Blue)    Shoulder Extension  Strengthening;20 reps;Theraband    Theraband Level (Shoulder Extension)  Level 4 (Blue)      Knee/Hip Exercises: Aerobic   Nustep  Level 3x 8 minutes; seat and arms 7   PT present to discuss progress     Knee/Hip Exercises: Standing   Heel Raises  Both;20 reps   no  UE standing on foam mat   Knee Flexion  Strengthening;Right;Left;2 sets;10 reps    Knee Flexion Limitations  3#- standing on balance pad    Hip Abduction  Stengthening;Both;2 sets;10 reps;Knee straight   Pt requires verbal cues for hip position   Abduction Limitations  3#-standing on balance pad    Hip Extension  Stengthening;Both;2 sets;10 reps    Extension Limitations  3#-standing on balance pad    Forward Step Up Limitations  6" alternating step taps 2x10    Rocker Board  3 minutes    Other Standing Knee Exercises  feet together eyes open: 2x30 seconds    Other Standing Knee Exercises  weight shifting on balance pad: 3 ways with min UE support x 1 minute each               PT Short Term Goals - 07/21/19 1231      PT SHORT TERM GOAL #1   Title  ind with initial HEP and doing consistently every day    Status  Achieved        PT Long Term Goals - 08/11/19 1226      PT LONG TERM GOAL #1   Title  ind with advanced HEP    Baseline  further advancement is needed    Time  8    Period  Weeks    Status  On-going       PT LONG TERM GOAL #2   Title  TUG < or = to 13 sec for reduced risk of falls    Baseline  12.97    Time  8    Period  Weeks    Status  On-going      PT LONG TERM GOAL #3   Title  pain reduced by 50% when walking    Baseline  no pain with walking    Status  Achieved      PT LONG TERM GOAL #4   Title  Pt will demonstrate 5x sit to stand in < or = to 15 sec without using UE to push on chair    Baseline  18.96- mimimal to no use of hands    Time  8    Period  Weeks    Status  On-going            Plan - 08/11/19 1238    Clinical Impression Statement  Pt is making steady progress with gait, balance and strength.  Pt demonstrates increased ease with sit to stand transition with reduced UE assistance.  5x sit to stand is improved to 18.96 and still indicating a falls risk.  TUG time is improved today with 12.97 seconds. PT was able to advance to standing on black pad with hip exercises to improve balance and proprioception.  Pt reports that getting in and out of chair, bed and shower are all easier and she feels steadier.  Pt requires stand by assistance for safety and cueing for technique.  Pt will continue to benefit from skilled PT to improve strength, endurance and balance to improve safety at home and in the community.    PT Frequency  2x / week    PT Duration  8 weeks    PT Treatment/Interventions  ADLs/Self Care Home Management;Neuromuscular re-education;Therapeutic activities;Therapeutic exercise;Patient/family education;Manual techniques;Taping;Moist Heat;Electrical Stimulation;Cryotherapy;Passive range of motion    PT Next Visit Plan  postural strength, hip and lumbar stretch, dynamic balance    PT Home Exercise Plan  Access Code: [email or web address]  Consulted and Agree with Plan of Care  Patient       Patient will benefit from skilled therapeutic intervention in order to improve the following deficits and impairments:  Abnormal gait, Decreased strength, Pain, Difficulty walking,  Postural dysfunction  Visit Diagnosis: 1. Abnormal posture   2. Chronic bilateral low back pain, unspecified whether sciatica present   3. History of falling        Problem List Patient Active Problem List   Diagnosis Date Noted  . OSA (obstructive sleep apnea) 02/08/2017  . Murmur, cardiac 12/31/2016  . Morbid obesity ([place]) 12/31/2016  . Cellulitis of left leg 07/07/2016  . Venous stasis ulcer of left lower extremity ([place]) 07/07/2016  . Chronic cholecystitis with calculus s/p lap chole 07/04/2014 07/03/2014  . HTN (hypertension) 05/31/2012  . Non-insulin dependent type 2 diabetes mellitus ([place]) 05/31/2012  . Hypercholesteremia 05/31/2012  . OAB (overactive bladder)   . Malignant neoplasm of corpus uteri, except isthmus ([place]) 12/10/2011    Class: Stage 1     Sigurd Sos, PT 08/11/19 1:02 PM  [facility] [address] [place], [place], [address] Phone: [phone]   Fax:  [phone]  Name: Jenna [name] MRN: [id number] Date of Birth: Aug 24, 1931

## 2019-08-16 ENCOUNTER — Ambulatory Visit: Payer: Medicare Other | Admitting: Physical Therapy

## 2019-08-16 ENCOUNTER — Encounter: Payer: Self-pay | Admitting: Physical Therapy

## 2019-08-16 ENCOUNTER — Other Ambulatory Visit: Payer: Self-pay

## 2019-08-16 DIAGNOSIS — Z9181 History of falling: Secondary | ICD-10-CM | POA: Diagnosis not present

## 2019-08-16 DIAGNOSIS — G8929 Other chronic pain: Secondary | ICD-10-CM

## 2019-08-16 DIAGNOSIS — M545 Low back pain: Secondary | ICD-10-CM | POA: Diagnosis not present

## 2019-08-16 DIAGNOSIS — R293 Abnormal posture: Secondary | ICD-10-CM | POA: Diagnosis not present

## 2019-08-16 NOTE — Therapy (Signed)
[facility] [address] [place], [place], [address] Phone: [phone]   Fax:  [phone]  Physical Therapy Treatment  Patient Details  Name: Jenna [name] MRN: [id number] Date of Birth: 04/10/1931 Referring Provider (PT): Lujean Amel, MD   Encounter Date: 08/16/2019  PT End of Session - 08/16/19 1232    Visit Number  11    Date for PT Re-Evaluation  08/31/19    Authorization Type  medicare    PT Start Time  1215    PT Stop Time  1255    PT Time Calculation (min)  40 min    Activity Tolerance  Patient tolerated treatment well       Past Medical History:  Diagnosis Date  . Arthritis   . Cancer [facility])    endometrial cancer  . Cellulitis of left lower extremity   . Diabetes mellitus   . Hyperlipemia   . Hypertension   . Non-healing ulcer of lower leg (HCC)    Left leg    . OAB (overactive bladder)   . Obesity    BMI 45    Past Surgical History:  Procedure Laterality Date  . ABDOMINAL HYSTERECTOMY  02/2008   robotic hyst BSO Bilateral LND  . EYE SURGERY     cataract  . IR GENERIC HISTORICAL  09/30/2016   IR EMBO VENOUS NOT HEMORR HEMANG  INC GUIDE ROADMAPPING [facility] ULTRASOUND  . JOINT REPLACEMENT     knee  . KNEE SURGERY     left knee  . LAPAROSCOPIC CHOLECYSTECTOMY SINGLE PORT N/A 07/04/2014   Procedure: LAPAROSCOPIC CHOLECYSTECTOMY SINGLE PORT;  Surgeon: Adin Hector, MD;  Location: [facility];  Service: General;  Laterality: N/A;  . ROTATOR CUFF REPAIR     right arm  . TUBAL LIGATION      There were no vitals filed for this visit.  Subjective Assessment - 08/16/19 1218    Subjective  No pain anywhere.  I walk better now.  I can get out of bed better too.  I can get out of the bathtub better too.  I can do everything better.  I think next visit is my last time.    Currently in Pain?  No/denies    Pain Score  0-No pain    Pain Type  Chronic pain                        [facility] Adult PT Treatment/Exercise - 08/16/19 0001      Neuro Re-ed    Neuro Re-ed Details   low to moderate dynamic standing balance challenges with staggered base of support and soft surface       Lumbar Exercises: Standing   Row  Strengthening;Both;20 reps;Theraband    Theraband Level (Row)  Level 4 (Blue)      Knee/Hip Exercises: Aerobic   Nustep  Level 3x 8 minutes; seat and arms 7   PT present to discuss progress     Knee/Hip Exercises: Standing   Heel Raises  Both;20 reps   no UE standing on foam mat   Knee Flexion  Strengthening;Right;Left;2 sets;10 reps    Knee Flexion Limitations  3#     Hip Abduction  Stengthening;Both;2 sets;10 reps;Knee straight   Pt requires verbal cues for hip position   Abduction Limitations  3#     Hip Extension  Stengthening;Both;2 sets;10 reps    Extension Limitations  3#     Forward Step  Up Limitations  6" alternating step taps 1 minute     Other Standing Knee Exercises  stepping over forward and to the side of obstacle     Other Standing Knee Exercises  holding blue band handles fixed with staggered stand weight shift and side to side weightshifting 1 min each       Knee/Hip Exercises: Seated   Long Arc Quad  Strengthening;Both;2 sets;10 reps    Long Arc Quad Limitations  3#                PT Short Term Goals - 07/21/19 1231      PT SHORT TERM GOAL #1   Title  ind with initial HEP and doing consistently every day    Status  Achieved        PT Long Term Goals - 08/11/19 1226      PT LONG TERM GOAL #1   Title  ind with advanced HEP    Baseline  further advancement is needed    Time  8    Period  Weeks    Status  On-going      PT LONG TERM GOAL #2   Title  TUG < or = to 13 sec for reduced risk of falls    Baseline  12.97    Time  8    Period  Weeks    Status  On-going      PT LONG TERM GOAL #3   Title  pain reduced by 50% when walking    Baseline  no pain with walking    Status   Achieved      PT LONG TERM GOAL #4   Title  Pt will demonstrate 5x sit to stand in < or = to 15 sec without using UE to push on chair    Baseline  18.96- mimimal to no use of hands    Time  8    Period  Weeks    Status  On-going            Plan - 08/16/19 1337    Clinical Impression Statement  The patient reports many functional improvements in the last few weeks and denies loss of balance since starting PT.  She has no loss of balance during treatment session with low to moderate balance challenges but requires close supervision for safety.   She does has decreased step length and clearance with stepping over objects.  She expresses readiness for discharge from PT next visit to independent  HEP.    Rehab Potential  Excellent    PT Frequency  2x / week    PT Duration  8 weeks    PT Treatment/Interventions  ADLs/Self Care Home Management;Neuromuscular re-education;Therapeutic activities;Therapeutic exercise;Patient/family education;Manual techniques;Taping;Moist Heat;Electrical Stimulation;Cryotherapy;Passive range of motion    PT Next Visit Plan  review HEP and discuss self progression for anticipated discharge;  postural strength, hip and lumbar stretch, dynamic balance    PT Home Exercise Plan  Access Code: [email or web address]       Patient will benefit from skilled therapeutic intervention in order to improve the following deficits and impairments:  Abnormal gait, Decreased strength, Pain, Difficulty walking, Postural dysfunction  Visit Diagnosis: 1. Abnormal posture   2. Chronic bilateral low back pain, unspecified whether sciatica present   3. History of falling        Problem List Patient Active Problem List   Diagnosis Date Noted  . OSA (obstructive sleep apnea) 02/08/2017  . Murmur, cardiac 12/31/2016  .  Morbid obesity ([place]) 12/31/2016  . Cellulitis of left leg 07/07/2016  . Venous stasis ulcer of left lower extremity ([place]) 07/07/2016  . Chronic cholecystitis with calculus  s/p lap chole 07/04/2014 07/03/2014  . HTN (hypertension) 05/31/2012  . Non-insulin dependent type 2 diabetes mellitus ([place]) 05/31/2012  . Hypercholesteremia 05/31/2012  . OAB (overactive bladder)   . Malignant neoplasm of corpus uteri, except isthmus ([place]) 12/10/2011    Class: Stage 1   Ruben Im, PT 08/16/19 1:46 PM Phone: [phone] Fax: [phone] Alvera Singh 08/16/2019, 1:45 PM  [facility] [address], [place], [place], [address] Phone: [phone]   Fax:  [phone]  Name: Jenna [name] MRN: [id number] Date of Birth: 1931/11/13

## 2019-08-18 ENCOUNTER — Ambulatory Visit: Payer: Medicare Other

## 2019-08-18 ENCOUNTER — Other Ambulatory Visit: Payer: Self-pay

## 2019-08-18 DIAGNOSIS — Z9181 History of falling: Secondary | ICD-10-CM | POA: Diagnosis not present

## 2019-08-18 DIAGNOSIS — R293 Abnormal posture: Secondary | ICD-10-CM | POA: Diagnosis not present

## 2019-08-18 DIAGNOSIS — G8929 Other chronic pain: Secondary | ICD-10-CM

## 2019-08-18 DIAGNOSIS — M545 Low back pain: Secondary | ICD-10-CM | POA: Diagnosis not present

## 2019-08-18 NOTE — Patient Instructions (Signed)
Access Code: [email or web address]  URL: [email or web address]  Date: 08/18/2019  Prepared by: Sigurd Sos   Exercises  Standing Hip Abduction with Counter Support - 10 reps - 2 sets - 2x daily - 7x weekly Standing Hip Extension with Counter Support - 10 reps - 2 sets - 2x daily - 7x weekly Heel rises with counter support - 10 reps - 2 sets - 2x daily - 7x weekly

## 2019-08-18 NOTE — Therapy (Signed)
[facility] [address], [place], [place], [address] Phone: [phone]   Fax:  [phone]  Physical Therapy Treatment  Patient Details  Name: Jenna [name] MRN: [id number] Date of Birth: [birth year] Referring Provider (PT): Lujean Amel, MD   Encounter Date: 08/18/2019  PT End of Session - 08/18/19 1256    Visit Number  12    PT Start Time  1217    PT Stop Time  3833    PT Time Calculation (min)  39 min    Activity Tolerance  Patient tolerated treatment well    Behavior During Therapy  [facility] for tasks assessed/performed       Past Medical History:  Diagnosis Date  . Arthritis   . Cancer [facility])    endometrial cancer  . Cellulitis of left lower extremity   . Diabetes mellitus   . Hyperlipemia   . Hypertension   . Non-healing ulcer of lower leg (HCC)    Left leg    . OAB (overactive bladder)   . Obesity    BMI 45    Past Surgical History:  Procedure Laterality Date  . ABDOMINAL HYSTERECTOMY  02/2008   robotic hyst BSO Bilateral LND  . EYE SURGERY     cataract  . IR GENERIC HISTORICAL  09/30/2016   IR EMBO VENOUS NOT HEMORR HEMANG  INC GUIDE ROADMAPPING [facility] ULTRASOUND  . JOINT REPLACEMENT     knee  . KNEE SURGERY     left knee  . LAPAROSCOPIC CHOLECYSTECTOMY SINGLE PORT N/A 07/04/2014   Procedure: LAPAROSCOPIC CHOLECYSTECTOMY SINGLE PORT;  Surgeon: Adin Hector, MD;  Location: [facility];  Service: General;  Laterality: N/A;  . ROTATOR CUFF REPAIR     right arm  . TUBAL LIGATION      There were no vitals filed for this visit.      [facility] PT Assessment - 08/18/19 0001      Assessment   Medical Diagnosis  W19.XXXA (ICD-10-CM) - Unspecified fall, initial encounter; M25.551 (ICD-10-CM) - Pain in right hip      Transfers   Five time sit to stand comments   18.95 seconds with minimal use of UEs      Timed Up and Go Test   TUG  Normal TUG    Normal TUG (seconds)  12.23    TUG Comments  no  cane, used arms on chair                   [facility] Adult PT Treatment/Exercise - 08/18/19 0001      Knee/Hip Exercises: Aerobic   Nustep  Level 3x 8 minutes; seat and arms 7   PT present to discuss progress     Knee/Hip Exercises: Standing   Heel Raises  Both;20 reps   no UE standing on foam mat   Knee Flexion  Strengthening;Right;Left;2 sets;10 reps    Knee Flexion Limitations  3#     Hip Abduction  Stengthening;Both;2 sets;10 reps;Knee straight   Pt requires verbal cues for hip position   Abduction Limitations  3#     Hip Extension  Stengthening;Both;2 sets;10 reps    Extension Limitations  3#     Forward Step Up Limitations  6" alternating step taps 1 minute     Other Standing Knee Exercises  weight shifting on balance pad: 3 ways with min UE support x 1 minute each      Knee/Hip Exercises: Seated   [employer]  Strengthening;Both;2 sets;10 reps    Long Arc Quad Limitations  3#              PT Education - 08/18/19 1249    Education Details  Access Code: [email or web address]    Person(s) Educated  Patient    Methods  Explanation;Demonstration;Handout    Comprehension  Returned demonstration;Verbalized understanding       PT Short Term Goals - 07/21/19 1231      PT SHORT TERM GOAL #1   Title  ind with initial HEP and doing consistently every day    Status  Achieved        PT Long Term Goals - 08/18/19 1229      PT LONG TERM GOAL #1   Title  ind with advanced HEP    Status  Achieved      PT LONG TERM GOAL #2   Title  TUG < or = to 13 sec for reduced risk of falls    Status  Achieved      PT LONG TERM GOAL #3   Title  pain reduced by 50% when walking    Status  Achieved      PT LONG TERM GOAL #4   Title  Pt will demonstrate 5x sit to stand in < or = to 15 sec without using UE to push on chair    Baseline  18.95 seconds    Status  Partially Met            Plan - 08/18/19 1244    Clinical Impression Statement  The patient reports many  functional improvements in the last few weeks and denies loss of balance since starting PT.  She has no loss of balance during treatment session with low to moderate balance challenges but requires close supervision for safety.   Pt performed TUG in 12.23 seconds and 5x sit to stand in 18.95 seconds.  This is improved from evaluation yet pt is still at risk for falls.  Pt has received education regarding fall prevention and has HEP in place to address strength and balance.  Pt continues to demonstrate shortened step length and difficulty clearing foot over objects.  Pt will continue to work on this at home.  Pt will D/C from PT today and follow-up with MD as needed.    PT Next Visit Plan  D/C PT to HEP    PT Home Exercise Plan  Access Code: [email or web address]    Consulted and Agree with Plan of Care  Patient       Patient will benefit from skilled therapeutic intervention in order to improve the following deficits and impairments:     Visit Diagnosis: Chronic bilateral low back pain, unspecified whether sciatica present  History of falling  Abnormal posture     Problem List Patient Active Problem List   Diagnosis Date Noted  . OSA (obstructive sleep apnea) 02/08/2017  . Murmur, cardiac 12/31/2016  . Morbid obesity ([place]) 12/31/2016  . Cellulitis of left leg 07/07/2016  . Venous stasis ulcer of left lower extremity ([place]) 07/07/2016  . Chronic cholecystitis with calculus s/p lap chole 07/04/2014 07/03/2014  . HTN (hypertension) 05/31/2012  . Non-insulin dependent type 2 diabetes mellitus ([place]) 05/31/2012  . Hypercholesteremia 05/31/2012  . OAB (overactive bladder)   . Malignant neoplasm of corpus uteri, except isthmus ([place]) 12/10/2011    Class: Stage 1  PHYSICAL THERAPY DISCHARGE SUMMARY  Visits from Start of Care: 12  Current functional level related to goals / functional  outcomes: See above for current status.  Pt has HEP in place for further gains.     Remaining deficits: See above.      Education / Equipment: HEP, fall prevention Plan: Patient agrees to discharge.  Patient goals were met. Patient is being discharged due to meeting the stated rehab goals.  ?????          Sigurd Sos, PT 08/18/19 12:58 PM   [facility] [address], [place], [place], [address] Phone: [phone]   Fax:  [phone]  Name: Jenna [name] MRN: [id number] Date of Birth: [birth year]

## 2019-09-19 DIAGNOSIS — Z7984 Long term (current) use of oral hypoglycemic drugs: Secondary | ICD-10-CM | POA: Diagnosis not present

## 2019-09-19 DIAGNOSIS — L97921 Non-pressure chronic ulcer of unspecified part of left lower leg limited to breakdown of skin: Secondary | ICD-10-CM | POA: Diagnosis not present

## 2019-09-19 DIAGNOSIS — R51 Headache: Secondary | ICD-10-CM | POA: Diagnosis not present

## 2019-09-19 DIAGNOSIS — E119 Type 2 diabetes mellitus without complications: Secondary | ICD-10-CM | POA: Diagnosis not present

## 2019-09-19 DIAGNOSIS — I10 Essential (primary) hypertension: Secondary | ICD-10-CM | POA: Diagnosis not present

## 2019-09-19 DIAGNOSIS — R600 Localized edema: Secondary | ICD-10-CM | POA: Diagnosis not present

## 2019-09-19 DIAGNOSIS — Z23 Encounter for immunization: Secondary | ICD-10-CM | POA: Diagnosis not present

## 2019-09-24 ENCOUNTER — Encounter (HOSPITAL_COMMUNITY): Payer: Self-pay

## 2019-09-24 ENCOUNTER — Emergency Department (HOSPITAL_COMMUNITY): Payer: Medicare Other

## 2019-09-24 ENCOUNTER — Other Ambulatory Visit: Payer: Self-pay

## 2019-09-24 ENCOUNTER — Emergency Department (HOSPITAL_COMMUNITY)
Admission: EM | Admit: 2019-09-24 | Discharge: 2019-09-24 | Disposition: A | Discharge status: Home / Self Care | Payer: Medicare Other | Attending: Emergency Medicine | Admitting: Emergency Medicine

## 2019-09-24 DIAGNOSIS — Z8542 Personal history of malignant neoplasm of other parts of uterus: Secondary | ICD-10-CM | POA: Diagnosis not present

## 2019-09-24 DIAGNOSIS — S0181XA Laceration without foreign body of other part of head, initial encounter: Secondary | ICD-10-CM | POA: Diagnosis not present

## 2019-09-24 DIAGNOSIS — S199XXA Unspecified injury of neck, initial encounter: Secondary | ICD-10-CM | POA: Diagnosis not present

## 2019-09-24 DIAGNOSIS — Z96659 Presence of unspecified artificial knee joint: Secondary | ICD-10-CM | POA: Diagnosis not present

## 2019-09-24 DIAGNOSIS — W01198A Fall on same level from slipping, tripping and stumbling with subsequent striking against other object, initial encounter: Secondary | ICD-10-CM | POA: Diagnosis not present

## 2019-09-24 DIAGNOSIS — S299XXA Unspecified injury of thorax, initial encounter: Secondary | ICD-10-CM | POA: Diagnosis not present

## 2019-09-24 DIAGNOSIS — Z7982 Long term (current) use of aspirin: Secondary | ICD-10-CM | POA: Insufficient documentation

## 2019-09-24 DIAGNOSIS — W19XXXA Unspecified fall, initial encounter: Secondary | ICD-10-CM | POA: Diagnosis not present

## 2019-09-24 DIAGNOSIS — M25561 Pain in right knee: Secondary | ICD-10-CM | POA: Diagnosis not present

## 2019-09-24 DIAGNOSIS — Y998 Other external cause status: Secondary | ICD-10-CM | POA: Diagnosis not present

## 2019-09-24 DIAGNOSIS — Z23 Encounter for immunization: Secondary | ICD-10-CM | POA: Insufficient documentation

## 2019-09-24 DIAGNOSIS — Z79899 Other long term (current) drug therapy: Secondary | ICD-10-CM | POA: Insufficient documentation

## 2019-09-24 DIAGNOSIS — Y9301 Activity, walking, marching and hiking: Secondary | ICD-10-CM | POA: Diagnosis not present

## 2019-09-24 DIAGNOSIS — E119 Type 2 diabetes mellitus without complications: Secondary | ICD-10-CM | POA: Insufficient documentation

## 2019-09-24 DIAGNOSIS — Y92481 Parking lot as the place of occurrence of the external cause: Secondary | ICD-10-CM | POA: Insufficient documentation

## 2019-09-24 DIAGNOSIS — S0990XA Unspecified injury of head, initial encounter: Secondary | ICD-10-CM | POA: Diagnosis not present

## 2019-09-24 DIAGNOSIS — I10 Essential (primary) hypertension: Secondary | ICD-10-CM | POA: Insufficient documentation

## 2019-09-24 DIAGNOSIS — S8391XA Sprain of unspecified site of right knee, initial encounter: Secondary | ICD-10-CM | POA: Insufficient documentation

## 2019-09-24 DIAGNOSIS — R52 Pain, unspecified: Secondary | ICD-10-CM | POA: Diagnosis not present

## 2019-09-24 DIAGNOSIS — Z7984 Long term (current) use of oral hypoglycemic drugs: Secondary | ICD-10-CM | POA: Diagnosis not present

## 2019-09-24 DIAGNOSIS — S01111A Laceration without foreign body of right eyelid and periocular area, initial encounter: Secondary | ICD-10-CM | POA: Diagnosis not present

## 2019-09-24 MED ORDER — ACETAMINOPHEN 500 MG PO TABS
1000.0000 mg | ORAL_TABLET | Freq: Once | ORAL | Status: AC
  Administered 2019-09-24: 1000 mg via ORAL
  Filled 2019-09-24: qty 2

## 2019-09-24 MED ORDER — TETANUS-DIPHTH-ACELL PERTUSSIS 5-2.5-18.5 LF-MCG/0.5 IM SUSP
0.5000 mL | Freq: Once | INTRAMUSCULAR | Status: AC
  Administered 2019-09-24: 0.5 mL via INTRAMUSCULAR
  Filled 2019-09-24: qty 0.5

## 2019-09-24 NOTE — ED Notes (Signed)
Discharge instructions reviewed with patient. Opportunity for questions and concerns allowed with none voiced. Patient alert and stable at time of discharge.

## 2019-09-24 NOTE — ED Notes (Signed)
Son Eliezer Mccoy  Cellphone # [phone]

## 2019-09-24 NOTE — Discharge Instructions (Signed)
Take tylenol for pain   Use your walker to help you walk.   Your CT head and xrays showed no fractures   See your doctor  Return to ER if you have another fall, severe pain, vomiting, unable to walk

## 2019-09-24 NOTE — ED Notes (Signed)
Pt ambulated to BR with minimal assist. Steady gait.

## 2019-09-24 NOTE — ED Provider Notes (Signed)
[facility] DEPT Provider Note   CSN: [id number] Arrival date & time: 09/24/19  1615     History   Chief Complaint Chief Complaint  Patient presents with  . Fall  . Facial Laceration    HPI Jenna [name] is a 85 y.o. female hx of DM, HTN, HL, here presenting with fall.  Patient states that she missed a step and fell in the parking lot in the grocery store.  She did hit her right eyebrow area as well as the right knee.  Denies passing out.  Had some right chest wall pain as well.      The history is provided by the patient.    Past Medical History:  Diagnosis Date  . Arthritis   . Cancer [facility])    endometrial cancer  . Cellulitis of left lower extremity   . Diabetes mellitus   . Hyperlipemia   . Hypertension   . Non-healing ulcer of lower leg (HCC)    Left leg    . OAB (overactive bladder)   . Obesity    BMI 45    Patient Active Problem List   Diagnosis Date Noted  . OSA (obstructive sleep apnea) 02/08/2017  . Murmur, cardiac 12/31/2016  . Morbid obesity ([place]) 12/31/2016  . Cellulitis of left leg 07/07/2016  . Venous stasis ulcer of left lower extremity ([place]) 07/07/2016  . Chronic cholecystitis with calculus s/p lap chole 07/04/2014 07/03/2014  . HTN (hypertension) 05/31/2012  . Non-insulin dependent type 2 diabetes mellitus ([place]) 05/31/2012  . Hypercholesteremia 05/31/2012  . OAB (overactive bladder)   . Malignant neoplasm of corpus uteri, except isthmus ([place]) 12/10/2011    Class: Stage 1    Past Surgical History:  Procedure Laterality Date  . ABDOMINAL HYSTERECTOMY  02/2008   robotic hyst BSO Bilateral LND  . EYE SURGERY     cataract  . IR GENERIC HISTORICAL  09/30/2016   IR EMBO VENOUS NOT HEMORR HEMANG  INC GUIDE ROADMAPPING [facility] ULTRASOUND  . JOINT REPLACEMENT     knee  . KNEE SURGERY     left knee  . LAPAROSCOPIC CHOLECYSTECTOMY SINGLE PORT N/A 07/04/2014   Procedure: LAPAROSCOPIC CHOLECYSTECTOMY SINGLE PORT;   Surgeon: Adin Hector, MD;  Location: [facility];  Service: General;  Laterality: N/A;  . ROTATOR CUFF REPAIR     right arm  . TUBAL LIGATION       OB History    Gravida  7   Para  7   Term  7   Preterm      AB      Living        SAB      TAB      Ectopic      Multiple      Live Births               Home Medications    Prior to Admission medications   Medication Sig Start Date End Date Taking? Authorizing Provider  amLODipine (NORVASC) 5 MG tablet Take 5 mg by mouth daily. 12/18/16   [provider]  aspirin 325 MG tablet Take 325 mg by mouth daily.    [provider]  atorvastatin (LIPITOR) 20 MG tablet Take 20 mg by mouth daily.      [provider]  brimonidine-timolol (COMBIGAN) 0.2-0.5 % ophthalmic solution Place 1 drop into both eyes every 12 (twelve) hours.    [provider]  doxycycline (VIBRAMYCIN) 100 MG capsule  Take 100 mg by mouth 2 (two) times daily. 05/17/18   [provider]  furosemide (LASIX) 40 MG tablet Take 40 mg by mouth daily. 02/19/18   [provider]  latanoprost (XALATAN) 0.005 % ophthalmic solution Place 1 drop into both eyes at bedtime.     [provider]  metFORMIN (GLUCOPHAGE) 1000 MG tablet Take 1,000 mg by mouth 2 (two) times daily with a meal.    [provider]  naproxen (NAPROSYN) 375 MG tablet Take 1 tablet (375 mg total) by mouth 2 (two) times daily. Patient not taking: Reported on 05/18/2018 06/29/16   Blanchie Dessert, MD  silver sulfADIAZINE (SILVADENE) 1 % cream APPLY TO AFFECTED AREA TWICE A DAY 05/03/18   [provider]  telmisartan (MICARDIS) 80 MG tablet Take 80 mg by mouth daily. 04/19/18   [provider]  tolterodine (DETROL LA) 4 MG 24 hr capsule Take 4 mg by mouth daily. 05/12/16   [provider]    Family History Family History  Problem Relation Age of Onset  . Cancer Brother 82       rectal  . Early death Brother      Social History Social History   Tobacco Use  . Smoking status: Never Smoker  . Smokeless tobacco: Never Used  Substance Use Topics  . Alcohol use: No  . Drug use: No     Allergies   Shrimp [shellfish allergy] and Ancef [cefazolin]   Review of Systems Review of Systems  Musculoskeletal:       R knee pain  Skin: Positive for wound.  All other systems reviewed and are negative.    Physical Exam Updated Vital Signs BP (!) 152/83 (BP Location: Left Arm)   Pulse 72   Temp 98 F (36.7 C) (Oral)   Resp 18   Ht 5\' 1"  (1.549 m)   Wt 99.8 kg   SpO2 98%   BMI 41.57 kg/m   Physical Exam Vitals signs and nursing note reviewed.  HENT:     Head: Normocephalic.     Comments: 2 cm laceration well approximated     Mouth/Throat:     Mouth: Mucous membranes are moist.  Eyes:     Extraocular Movements: Extraocular movements intact.     Pupils: Pupils are equal, round, and reactive to light.  Neck:     Musculoskeletal: Normal range of motion and neck supple.     Comments: No midline tenderness  Cardiovascular:     Rate and Rhythm: Normal rate.     Pulses: Normal pulses.  Pulmonary:     Effort: Pulmonary effort is normal.     Breath sounds: Normal breath sounds.  Abdominal:     General: Abdomen is flat.     Palpations: Abdomen is soft.  Musculoskeletal:     Comments: R knee slightly swollen, nl ROM. Bruising R knee but no deformity. Pelvis stable   Skin:    General: Skin is warm.     Capillary Refill: Capillary refill takes less than 2 seconds.  Neurological:     General: No focal deficit present.     Mental Status: She is alert and oriented to person, place, and time.     Cranial Nerves: No cranial nerve deficit.     Sensory: No sensory deficit.     Motor: No weakness.     Coordination: Coordination normal.  Psychiatric:        Mood and Affect: Mood normal.  Behavior: Behavior normal.      ED Treatments / Results  Labs (all labs ordered are  listed, but only abnormal results are displayed) Labs Reviewed - No data to display  EKG None  Radiology Dg Chest 2 View  Result Date: 09/24/2019 CLINICAL DATA:  Fall EXAM: CHEST - 2 VIEW COMPARISON:  10/09/2006 FINDINGS: The heart size and mediastinal contours are within normal limits. Both lungs are clear. The visualized skeletal structures are unremarkable. IMPRESSION: No active cardiopulmonary disease. Electronically Signed   By: Donavan Foil M.D.   On: 09/24/2019 18:27   Ct Head Wo Contrast  Result Date: 09/24/2019 CLINICAL DATA:  Head trauma EXAM: CT HEAD WITHOUT CONTRAST TECHNIQUE: Contiguous axial images were obtained from the base of the skull through the vertex without intravenous contrast. COMPARISON:  None. FINDINGS: Brain: No intracranial hemorrhage. No parenchymal contusion. No midline shift or mass effect. Basilar cisterns are patent. No skull base fracture. No fluid in the paranasal sinuses or mastoid air cells. Orbits are normal. There are periventricular and subcortical white matter hypodensities. Generalized cortical atrophy. Vascular: No hyperdense vessel or unexpected calcification. Skull: Normal. Negative for fracture or focal lesion. Sinuses/Orbits: Paranasal sinuses and mastoid air cells are clear. Orbits are clear. Other: None. IMPRESSION: 1. No acute intracranial findings. 2. Atrophy and chronic white matter microvascular disease. Electronically Signed   By: Suzy Bouchard M.D.   On: 09/24/2019 18:11   Ct Cervical Spine Wo Contrast  Result Date: 09/24/2019 CLINICAL DATA:  [address] and hit head, laceration EXAM: CT CERVICAL SPINE WITHOUT CONTRAST TECHNIQUE: Multidetector CT imaging of the cervical spine was performed without intravenous contrast. Multiplanar CT image reconstructions were also generated. COMPARISON:  CT cervical spine 08/22/2014, radiograph 05/16/2015 FINDINGS: Alignment: Straightening of the cervical spine. No subluxation. Facet alignment is maintained Skull  base and vertebrae: No acute fracture. No primary bone lesion or focal pathologic process. Soft tissues and spinal canal: No prevertebral fluid or swelling. No visible canal hematoma. Disc levels: Mild disc space narrowing at C4-C5, C5-C6. Multiple level facet degenerative change. Multiple level bilateral foraminal stenosis. Upper chest: Negative. Other: Carotid vascular calcification. IMPRESSION: Straightening of the cervical spine.  No acute osseous abnormality Electronically Signed   By: Donavan Foil M.D.   On: 09/24/2019 18:19   Dg Knee Complete 4 Views Right  Result Date: 09/24/2019 CLINICAL DATA:  [address] at a grocery store, right knee pain EXAM: RIGHT KNEE - COMPLETE 4+ VIEW COMPARISON:  08/22/2014 FINDINGS: No acute displaced fracture or malalignment. Small knee effusion. Moderate patellofemoral degenerative change and medial joint space degenerative change. Advanced degenerative change of the lateral compartment. IMPRESSION: 1. No acute osseous abnormality 2. Tricompartment arthritis with small knee effusion Electronically Signed   By: Donavan Foil M.D.   On: 09/24/2019 18:26    Procedures Procedures (including critical care time)  LACERATION REPAIR Performed by: Wandra Arthurs Authorized by: Wandra Arthurs Consent: Verbal consent obtained. Risks and benefits: risks, benefits and alternatives were discussed Consent given by: patient Patient identity confirmed: provided demographic data Prepped and Draped in normal sterile fashion Wound explored  Laceration Location: R eyebrow  Laceration Length: 2 cm  No Foreign Bodies seen or palpated  Anesthesia: local infiltration  Local anesthetic: none    Irrigation method: syringe Amount of cleaning: standard  Skin closure: dermabond    Patient tolerance: Patient tolerated the procedure well with no immediate complications.   Medications Ordered in ED Medications  acetaminophen (TYLENOL) tablet 1,000 mg (1,000 mg Oral Given  09/24/19  1725)  Tdap (BOOSTRIX) injection 0.5 mL (0.5 mLs Intramuscular Given 09/24/19 1725)     Initial Impression / Assessment and Plan / ED Course  I have reviewed the triage vital signs and the nursing notes.  Pertinent labs & imaging results that were available during my care of the patient were reviewed by me and considered in my medical decision making (see chart for details).       Jenna [name] is a 85 y.o. female here presenting with fall.  She does have a small laceration to the right eyebrow that is well approximated.  She is not on any blood thinners and has nonfocal neuro exam. Will get CT head, xrays.   7:36 PM CT unremarkable. Xrays unremarkable. Stable for discharge. She has walker at home and I encourage her to use it    Final Clinical Impressions(s) / ED Diagnoses   Final diagnoses:  Facial laceration, initial encounter  Sprain of right knee, unspecified ligament, initial encounter    ED Discharge Orders    None       Drenda Freeze, MD 09/24/19 [phone]

## 2019-09-24 NOTE — ED Triage Notes (Signed)
She states she mis stepped and fell in the parking lot of a local grocery store, and thereby fell. She c/o a small superficial lac. At lat. Right eyebrow area. She also c/o some mild left knee pain. She is alert and oriented x 4 with clear speech.

## 2019-09-29 DIAGNOSIS — I10 Essential (primary) hypertension: Secondary | ICD-10-CM | POA: Diagnosis not present

## 2019-09-29 DIAGNOSIS — S0181XD Laceration without foreign body of other part of head, subsequent encounter: Secondary | ICD-10-CM | POA: Diagnosis not present

## 2019-10-24 DIAGNOSIS — I83029 Varicose veins of left lower extremity with ulcer of unspecified site: Secondary | ICD-10-CM | POA: Diagnosis not present

## 2019-10-24 DIAGNOSIS — L97929 Non-pressure chronic ulcer of unspecified part of left lower leg with unspecified severity: Secondary | ICD-10-CM | POA: Diagnosis not present

## 2019-11-02 ENCOUNTER — Encounter: Payer: Medicare Other | Attending: Internal Medicine | Admitting: Internal Medicine

## 2019-11-02 ENCOUNTER — Other Ambulatory Visit: Payer: Self-pay

## 2019-11-02 DIAGNOSIS — L97222 Non-pressure chronic ulcer of left calf with fat layer exposed: Secondary | ICD-10-CM | POA: Diagnosis not present

## 2019-11-02 DIAGNOSIS — E119 Type 2 diabetes mellitus without complications: Secondary | ICD-10-CM | POA: Insufficient documentation

## 2019-11-02 DIAGNOSIS — M1990 Unspecified osteoarthritis, unspecified site: Secondary | ICD-10-CM | POA: Diagnosis not present

## 2019-11-02 DIAGNOSIS — E785 Hyperlipidemia, unspecified: Secondary | ICD-10-CM | POA: Insufficient documentation

## 2019-11-02 DIAGNOSIS — I10 Essential (primary) hypertension: Secondary | ICD-10-CM | POA: Diagnosis not present

## 2019-11-02 DIAGNOSIS — I872 Venous insufficiency (chronic) (peripheral): Secondary | ICD-10-CM | POA: Diagnosis not present

## 2019-11-02 DIAGNOSIS — L97221 Non-pressure chronic ulcer of left calf limited to breakdown of skin: Secondary | ICD-10-CM | POA: Diagnosis not present

## 2019-11-02 DIAGNOSIS — I87333 Chronic venous hypertension (idiopathic) with ulcer and inflammation of bilateral lower extremity: Secondary | ICD-10-CM | POA: Insufficient documentation

## 2019-11-03 NOTE — Progress Notes (Signed)
Jenna [name], Jenna [name] ([id number]) Visit Report for 11/02/2019 Abuse/Suicide Risk Screen Details Patient Name: Jenna [name], Jenna [name]. Date of Service: 11/02/2019 2:15 PM Medical Record Number: [id number] Patient Account Number: [id number] Date of Birth/Sex: March 24, 1931 (85 y.o. F) Treating RN: Arnette Norris Primary Care Kinzey Sheriff: Darrow Bussing Other Clinician: Referring Margeart Allender: Garth Bigness Treating Airelle Everding/Extender: Altamese [place] in Treatment: 0 Abuse/Suicide Risk Screen Items Answer ABUSE RISK SCREEN: Has anyone close to you tried to hurt or harm you recentlyo No Do you feel uncomfortable with anyone in your familyo No Has anyone forced you do things that you didnot want to doo No Electronic Signature(s) Signed: 11/03/2019 4:15:06 PM By: Arnette Norris Entered By: Arnette Norris on 11/02/2019 14:24:37 Jenna [name] ([id number]) -------------------------------------------------------------------------------- Activities of Daily Living Details Patient Name: Jenna [name] Date of Service: 11/02/2019 2:15 PM Medical Record Number: [id number] Patient Account Number: [id number] Date of Birth/Sex: 09-03-1931 (85 y.o. F) Treating RN: Arnette Norris Primary Care Lesette Frary: Darrow Bussing Other Clinician: Referring Deshannon Hinchliffe: Garth Bigness Treating Kymari Lollis/Extender: Altamese [place] in Treatment: 0 Activities of Daily Living Items Answer Activities of Daily Living (Please select one for each item) Drive Automobile Completely Able Take Medications Completely Able Use Telephone Completely Able Care for Appearance Completely Able Use Toilet Completely Able Bath / Shower Completely Able Dress Self Completely Able Feed Self Completely Able Walk Completely Able Get In / Out Bed Completely Able Housework Completely Able Prepare Meals Completely Able Handle Money Completely Able Shop for Self Completely Able Electronic Signature(s) Signed: 11/03/2019  4:15:06 PM By: Arnette Norris Entered By: Arnette Norris on 11/02/2019 14:24:48 Jenna [name] ([id number]) -------------------------------------------------------------------------------- Education Screening Details Patient Name: Jenna [name] Date of Service: 11/02/2019 2:15 PM Medical Record Number: [id number] Patient Account Number: [id number] Date of Birth/Sex: December 14, 1931 (85 y.o. F) Treating RN: Arnette Norris Primary Care Myeshia Fojtik: Darrow Bussing Other Clinician: Referring Daymon Hora: Garth Bigness Treating Genevive Printup/Extender: Altamese [place] in Treatment: 0 Primary Learner Assessed: Patient Learning Preferences/Education Level/Primary Language Learning Preference: Explanation Highest Education Level: High School Preferred Language: English Cognitive Barrier Language Barrier: No Translator Needed: No Memory Deficit: No Emotional Barrier: No Cultural/Religious Beliefs Affecting Medical Care: No Physical Barrier Impaired Vision: No Impaired Hearing: No Decreased Hand dexterity: No Knowledge/Comprehension Knowledge Level: High Comprehension Level: High Ability to understand written High instructions: Ability to understand verbal High instructions: Motivation Anxiety Level: Calm Cooperation: Cooperative Education Importance: Acknowledges Need Interest in Health Problems: Asks Questions Perception: Coherent Willingness to Engage in Self- High Management Activities: Readiness to Engage in Self- High Management Activities: Electronic Signature(s) Signed: 11/03/2019 4:15:06 PM By: Arnette Norris Entered By: Arnette Norris on 11/02/2019 14:25:10 Jenna [name] ([id number]) -------------------------------------------------------------------------------- Fall Risk Assessment Details Patient Name: Jenna [name] Date of Service: 11/02/2019 2:15 PM Medical Record Number: [id number] Patient Account Number: [id number] Date of Birth/Sex:  January 13, 1931 (85 y.o. F) Treating RN: Arnette Norris Primary Care Aldyn Toon: Darrow Bussing Other Clinician: Referring Akasia Ahmad: Garth Bigness Treating Satonya Lux/Extender: Altamese [place] in Treatment: 0 Fall Risk Assessment Items Have you had 2 or more falls in the last 12 monthso 0 Yes Have you had any fall that resulted in injury in the last 12 monthso 0 No FALLS RISK SCREEN History of falling - immediate or within 3 months 25 Yes Secondary diagnosis (Do you have 2 or more medical diagnoseso) 0 No Ambulatory aid None/bed rest/wheelchair/nurse 0 No Crutches/cane/walker 15 Yes Furniture 0 No Intravenous therapy Access/Saline/Heparin Lock 0 No Gait/Transferring Normal/ bed rest/ wheelchair 0 No Weak (short  steps with or without shuffle, stooped but able to lift head while 0 No walking, may seek support from furniture) Impaired (short steps with shuffle, may have difficulty arising from chair, head 0 No down, impaired balance) Mental Status Oriented to own ability 0 Yes Electronic Signature(s) Signed: 11/03/2019 4:15:06 PM By: Arnette Norris Entered By: Arnette Norris on 11/02/2019 14:25:36 Jenna [name] ([id number]) -------------------------------------------------------------------------------- Foot Assessment Details Patient Name: Jenna [name] Date of Service: 11/02/2019 2:15 PM Medical Record Number: [id number] Patient Account Number: [id number] Date of Birth/Sex: 05-17-1931 (85 y.o. F) Treating RN: Arnette Norris Primary Care [place]: Darrow Bussing Other Clinician: Referring Jeferson Boozer: Garth Bigness Treating Chas Axel/Extender: Altamese [place] in Treatment: 0 Foot Assessment Items Site Locations + = Sensation present, - = Sensation absent, C = Callus, U = Ulcer R = Redness, W = Warmth, M = Maceration, PU = Pre-ulcerative lesion F = Fissure, S = Swelling, D = Dryness Assessment Right: Left: Other Deformity: No No Prior Foot Ulcer:  No No Prior Amputation: No No Charcot Joint: No No Ambulatory Status: Ambulatory With Help Assistance Device: Cane Gait: Steady Electronic Signature(s) Signed: 11/03/2019 4:15:06 PM By: Arnette Norris Entered By: Arnette Norris on 11/02/2019 14:30:48 Jenna [name] ([id number]) -------------------------------------------------------------------------------- Nutrition Risk Screening Details Patient Name: Jenna [name] Date of Service: 11/02/2019 2:15 PM Medical Record Number: [id number] Patient Account Number: [id number] Date of Birth/Sex: 10-Aug-1931 (85 y.o. F) Treating RN: Arnette Norris Primary Care Di Jasmer: Darrow Bussing Other Clinician: Referring Shakela Donati: Garth Bigness Treating Ichelle Harral/Extender: Altamese [place] in Treatment: 0 Height (in): 61 Weight (lbs): 220 Body Mass Index (BMI): 41.6 Nutrition Risk Screening Items Score Screening NUTRITION RISK SCREEN: I have an illness or condition that made me change the kind and/or amount of 0 No food I eat I eat fewer than two meals per day 0 No I eat few fruits and vegetables, or milk products 0 No I have three or more drinks of beer, liquor or wine almost every day 0 No I have tooth or mouth problems that make it hard for me to eat 0 No I don't always have enough money to buy the food I need 0 No I eat alone most of the time 0 No I take three or more different prescribed or over-the-counter drugs a day 1 Yes Without wanting to, I have lost or gained 10 pounds in the last six months 0 No I am not always physically able to shop, cook and/or feed myself 0 No Nutrition Protocols Good Risk Protocol Moderate Risk Protocol High Risk Proctocol Risk Level: Good Risk Score: 1 Electronic Signature(s) Signed: 11/03/2019 4:15:06 PM By: Arnette Norris Entered By: Arnette Norris on 11/02/2019 14:25:43

## 2019-11-04 NOTE — Progress Notes (Signed)
Jenna [name] ([id number]) Tendon: No Muscle: No Joint: No Bone: No Epithelialization: None N/A N/A Treatment Notes Electronic Signature(s) Signed: 11/03/2019 5:19:38 PM By: Gretta Cool, [name], RN, CWS, [name], [name] Entered By: Gretta Cool, [name], RN, CWS, Kim on 11/02/2019 15:12:33 Jenna [name] ([id number]) -------------------------------------------------------------------------------- Jenna [name] Patient Name: Jenna [name], Jenna [name]. Date of Service: 11/02/2019 2:15 PM Medical Record Number: [id number] Patient Account Number: [id number] Date of Birth/Sex: [birth year] (85 y.o. F) Treating RN: Cornell Barman Primary Care Hyden Soley: Lujean Amel Other Clinician: Referring Anarely Nicholls: Sela Hilding Treating Alanta Scobey/Extender: Tito Dine in Treatment: 0 Active Inactive Necrotic Tissue Nursing Diagnoses: Impaired tissue integrity related to necrotic/devitalized tissue Goals: Necrotic/devitalized tissue will be minimized in the wound bed Date Initiated:  11/02/2019 Target Resolution Date: 11/09/2019 Goal Status: Active Interventions: Assess patient pain level pre-, during and post procedure and prior to discharge Treatment Activities: Apply topical anesthetic as ordered : 11/02/2019 Notes: Orientation to the Wound Care Program Nursing Diagnoses: Knowledge deficit related to the wound healing center program Goals: Patient/caregiver will verbalize understanding of the [facility] Date Initiated: 11/02/2019 Target Resolution Date: 11/09/2019 Goal Status: Active Interventions: Provide education on orientation to the wound center Notes: Venous Leg Ulcer Nursing Diagnoses: Potential for venous Insuffiency (use before diagnosis confirmed) Goals: Non-invasive venous studies are completed as ordered Date Initiated: 11/02/2019 Target Resolution Date: 11/09/2019 Goal Status: Active Jenna [name] ([id number]) Interventions: Assess peripheral edema status every visit. Provide education on venous insufficiency Treatment Activities: Therapeutic compression applied : 11/02/2019 Notes: Wound/Skin Impairment Nursing Diagnoses: Impaired tissue integrity Goals: Ulcer/skin breakdown will have a volume reduction of 30% by week 4 Date Initiated: 11/02/2019 Target Resolution Date: 11/30/2019 Goal Status: Active Interventions: Assess patient/caregiver ability to obtain necessary supplies Notes: Electronic Signature(s) Signed: 11/03/2019 5:19:38 PM By: Gretta Cool, [name], RN, CWS, [name], [name] Entered By: Gretta Cool, [name], RN, CWS, Kim on 11/02/2019 15:12:13 Jenna [name] ([id number]) -------------------------------------------------------------------------------- Pain Assessment [name] Patient Name: Jenna [name] Date of Service: 11/02/2019 2:15 PM Medical Record Number: [id number] Patient Account Number: [id number] Date of Birth/Sex: [birth year] (85 y.o. F) Treating RN: Cornell Barman Primary Care Kyoko Elsea: Lujean Amel Other  Clinician: Referring Advith Martine: Sela Hilding Treating Anastaisa Wooding/Extender: Tito Dine in Treatment: 0 Active Problems Location of Pain Severity and Description of Pain Patient Has Paino No Site Locations Pain Management and Medication Current Pain Management: Electronic Signature(s) Signed: 11/03/2019 10:34:41 AM By: Lorine Bears RCP, RRT, CHT Signed: 11/03/2019 5:19:38 PM By: Gretta Cool, [name], RN, CWS, [name], [name] Entered By: Lorine Bears on 11/02/2019 14:14:27 Jenna [name] ([id number]) -------------------------------------------------------------------------------- Patient/Caregiver Education [name] Patient Name: Jenna [name] Date of Service: 11/02/2019 2:15 PM Medical Record Number: [id number] Patient Account Number: [id number] Date of Birth/Gender: 1931/08/28 (85 y.o. F) Treating RN: Cornell Barman Primary Care Physician: Lujean Amel Other Clinician: Referring Physician: Sela Hilding Treating Physician/Extender: Tito Dine in Treatment: 0 Education Assessment Education Provided To: Patient Education Topics Provided Venous: Handouts: Controlling Swelling with Multilayered Compression Wraps Methods: Demonstration, Explain/Verbal Responses: State content correctly Welcome To [facility]: Handouts: Welcome To [facility] Methods: Demonstration, Explain/Verbal Responses: State content correctly Electronic Signature(s) Signed: 11/03/2019 5:19:38 PM By: Gretta Cool, [name], RN, CWS, [name], [name] Entered By: Gretta Cool, [name], RN, CWS, Kim on 11/02/2019 15:16:16 Jenna [name] ([id number]) -------------------------------------------------------------------------------- Wound Assessment [name] Patient Name: Jenna [name] Date of Service: 11/02/2019 2:15 PM Medical Record Number: [id number] Patient Account Number: [id number] Date of Birth/Sex: [birth year] (85 y.o. F)  Jenna [name] ([id number]) Visit Report for 11/02/2019 Allergy List [name] Patient Name: Jenna [name]. Date of Service: 11/02/2019 2:15 PM Medical Record Number: [id number] Patient Account Number: [id number] Date of Birth/Sex: [birth year] (85 y.o. F) Treating RN: Harold Barban Primary Care Brittini Brubeck: Lujean Amel Other Clinician: Referring Marshella Tello: Sela Hilding Treating Lexington Devine/Extender: Ricard Dillon Weeks in Treatment: 0 Allergies Active Allergies Shellfish Reaction: anaphlaxsis Severity: Severe Ancef Reaction: nausea and vomiting Severity: Severe Allergy Notes Electronic Signature(s) Signed: 11/03/2019 4:15:06 PM By: Harold Barban Entered By: Harold Barban on 11/02/2019 14:27:55 Jenna [name] ([id number]) -------------------------------------------------------------------------------- Jenna [name] Patient Name: Jenna [name] Date of Service: 11/02/2019 2:15 PM Medical Record Number: [id number] Patient Account Number: [id number] Date of Birth/Sex: Aug 19, 1931 (85 y.o. F) Treating RN: Cornell Barman Primary Care Corliss Lamartina: Lujean Amel Other Clinician: Referring Infantof Villagomez: Sela Hilding Treating Zackariah Vanderpol/Extender: Tito Dine in Treatment: 0 Visit Information Patient Arrived: Cane Jenna Time: 14:13 Accompanied By: self Transfer Assistance: None Patient Identification Verified: Yes Secondary Verification Process Completed: Yes History Since Last Visit Added or deleted any medications: No Any new allergies or adverse reactions: No Had a fall or experienced change in activities of daily living that may affect risk of falls: No Signs or symptoms of abuse/neglect since last visito No Hospitalized since last visit: No Implantable device outside of the clinic excluding cellular tissue based products placed in the center since last visit: No Electronic Signature(s) Signed: 11/03/2019 10:34:41 AM By: Lorine Bears RCP, RRT, CHT Entered By: Lorine Bears on 11/02/2019 14:13:52 Jenna [name] ([id number]) -------------------------------------------------------------------------------- Clinic Level of Care Assessment [name] Patient Name: Jenna [name] Date of Service: 11/02/2019 2:15 PM Medical Record Number: [id number] Patient Account Number: [id number] Date of Birth/Sex: 25-Apr-1931 (85 y.o. F) Treating RN: Cornell Barman Primary Care Marlisha Vanwyk: Lujean Amel Other Clinician: Referring Lois Slagel: Sela Hilding Treating Marytza Grandpre/Extender: Tito Dine in Treatment: 0 Clinic Level of Care Assessment Items TOOL 1 Quantity Score []  - Use when EandM and Procedure is performed on INITIAL visit 0 ASSESSMENTS - Nursing Assessment / Reassessment X - General Physical Exam (combine w/ comprehensive assessment (listed just below) when 1 20 performed on new pt. evals) X- 1 25 Comprehensive Assessment (HX, ROS, Risk Assessments, Wounds Hx, etc.) ASSESSMENTS - Wound and Skin Assessment / Reassessment []  - Dermatologic / Skin Assessment (not related to wound area) 0 ASSESSMENTS - Ostomy and/or Continence Assessment and Care []  - Incontinence Assessment and Management 0 []  - 0 Ostomy Care Assessment and Management (repouching, etc.) PROCESS - Coordination of Care X - Simple Patient / Family Education for ongoing care 1 15 []  - 0 Complex (extensive) Patient / Family Education for ongoing care []  - 0 Staff obtains Programmer, systems, Records, Test Results / Process Orders []  - 0 Staff telephones HHA, Nursing Homes / Clarify orders / etc []  - 0 Routine Transfer to another Facility (non-emergent condition) []  - 0 Routine Hospital Admission (non-emergent condition) X- 1 15 New Admissions / Biomedical engineer / Ordering NPWT, Apligraf, etc. []  - 0 Emergency Hospital Admission (emergent condition) PROCESS - Special Needs []  - Pediatric / Minor Patient  Management 0 []  - 0 Isolation Patient Management []  - 0 Hearing / Language / Visual special needs []  - 0 Assessment of Community assistance (transportation, D/C planning, etc.) []  - 0 Additional assistance / Altered mentation []  - 0 Support Surface(s) Assessment (bed, cushion, seat, etc.) CHLOE, ADGATE. ([id number]) INTERVENTIONS - Miscellaneous []  - External ear exam 0 []  - 0 Patient  Jenna [name] ([id number]) Tendon: No Muscle: No Joint: No Bone: No Epithelialization: None N/A N/A Treatment Notes Electronic Signature(s) Signed: 11/03/2019 5:19:38 PM By: Gretta Cool, [name], RN, CWS, [name], [name] Entered By: Gretta Cool, [name], RN, CWS, Kim on 11/02/2019 15:12:33 Jenna [name] ([id number]) -------------------------------------------------------------------------------- Jenna [name] Patient Name: Jenna [name], Jenna [name]. Date of Service: 11/02/2019 2:15 PM Medical Record Number: [id number] Patient Account Number: [id number] Date of Birth/Sex: [birth year] (85 y.o. F) Treating RN: Cornell Barman Primary Care Hyden Soley: Lujean Amel Other Clinician: Referring Anarely Nicholls: Sela Hilding Treating Alanta Scobey/Extender: Tito Dine in Treatment: 0 Active Inactive Necrotic Tissue Nursing Diagnoses: Impaired tissue integrity related to necrotic/devitalized tissue Goals: Necrotic/devitalized tissue will be minimized in the wound bed Date Initiated:  11/02/2019 Target Resolution Date: 11/09/2019 Goal Status: Active Interventions: Assess patient pain level pre-, during and post procedure and prior to discharge Treatment Activities: Apply topical anesthetic as ordered : 11/02/2019 Notes: Orientation to the Wound Care Program Nursing Diagnoses: Knowledge deficit related to the wound healing center program Goals: Patient/caregiver will verbalize understanding of the [facility] Date Initiated: 11/02/2019 Target Resolution Date: 11/09/2019 Goal Status: Active Interventions: Provide education on orientation to the wound center Notes: Venous Leg Ulcer Nursing Diagnoses: Potential for venous Insuffiency (use before diagnosis confirmed) Goals: Non-invasive venous studies are completed as ordered Date Initiated: 11/02/2019 Target Resolution Date: 11/09/2019 Goal Status: Active Jenna [name] ([id number]) Interventions: Assess peripheral edema status every visit. Provide education on venous insufficiency Treatment Activities: Therapeutic compression applied : 11/02/2019 Notes: Wound/Skin Impairment Nursing Diagnoses: Impaired tissue integrity Goals: Ulcer/skin breakdown will have a volume reduction of 30% by week 4 Date Initiated: 11/02/2019 Target Resolution Date: 11/30/2019 Goal Status: Active Interventions: Assess patient/caregiver ability to obtain necessary supplies Notes: Electronic Signature(s) Signed: 11/03/2019 5:19:38 PM By: Gretta Cool, [name], RN, CWS, [name], [name] Entered By: Gretta Cool, [name], RN, CWS, Kim on 11/02/2019 15:12:13 Jenna [name] ([id number]) -------------------------------------------------------------------------------- Pain Assessment [name] Patient Name: Jenna [name] Date of Service: 11/02/2019 2:15 PM Medical Record Number: [id number] Patient Account Number: [id number] Date of Birth/Sex: [birth year] (85 y.o. F) Treating RN: Cornell Barman Primary Care Kyoko Elsea: Lujean Amel Other  Clinician: Referring Advith Martine: Sela Hilding Treating Anastaisa Wooding/Extender: Tito Dine in Treatment: 0 Active Problems Location of Pain Severity and Description of Pain Patient Has Paino No Site Locations Pain Management and Medication Current Pain Management: Electronic Signature(s) Signed: 11/03/2019 10:34:41 AM By: Lorine Bears RCP, RRT, CHT Signed: 11/03/2019 5:19:38 PM By: Gretta Cool, [name], RN, CWS, [name], [name] Entered By: Lorine Bears on 11/02/2019 14:14:27 Jenna [name] ([id number]) -------------------------------------------------------------------------------- Patient/Caregiver Education [name] Patient Name: Jenna [name] Date of Service: 11/02/2019 2:15 PM Medical Record Number: [id number] Patient Account Number: [id number] Date of Birth/Gender: 1931/08/28 (85 y.o. F) Treating RN: Cornell Barman Primary Care Physician: Lujean Amel Other Clinician: Referring Physician: Sela Hilding Treating Physician/Extender: Tito Dine in Treatment: 0 Education Assessment Education Provided To: Patient Education Topics Provided Venous: Handouts: Controlling Swelling with Multilayered Compression Wraps Methods: Demonstration, Explain/Verbal Responses: State content correctly Welcome To [facility]: Handouts: Welcome To [facility] Methods: Demonstration, Explain/Verbal Responses: State content correctly Electronic Signature(s) Signed: 11/03/2019 5:19:38 PM By: Gretta Cool, [name], RN, CWS, [name], [name] Entered By: Gretta Cool, [name], RN, CWS, Kim on 11/02/2019 15:16:16 Jenna [name] ([id number]) -------------------------------------------------------------------------------- Wound Assessment [name] Patient Name: Jenna [name] Date of Service: 11/02/2019 2:15 PM Medical Record Number: [id number] Patient Account Number: [id number] Date of Birth/Sex: [birth year] (85 y.o. F)  Jenna [name] ([id number]) Visit Report for 11/02/2019 Allergy List [name] Patient Name: Jenna [name]. Date of Service: 11/02/2019 2:15 PM Medical Record Number: [id number] Patient Account Number: [id number] Date of Birth/Sex: [birth year] (85 y.o. F) Treating RN: Harold Barban Primary Care Brittini Brubeck: Lujean Amel Other Clinician: Referring Marshella Tello: Sela Hilding Treating Lexington Devine/Extender: Ricard Dillon Weeks in Treatment: 0 Allergies Active Allergies Shellfish Reaction: anaphlaxsis Severity: Severe Ancef Reaction: nausea and vomiting Severity: Severe Allergy Notes Electronic Signature(s) Signed: 11/03/2019 4:15:06 PM By: Harold Barban Entered By: Harold Barban on 11/02/2019 14:27:55 Jenna [name] ([id number]) -------------------------------------------------------------------------------- Jenna [name] Patient Name: Jenna [name] Date of Service: 11/02/2019 2:15 PM Medical Record Number: [id number] Patient Account Number: [id number] Date of Birth/Sex: Aug 19, 1931 (85 y.o. F) Treating RN: Cornell Barman Primary Care Corliss Lamartina: Lujean Amel Other Clinician: Referring Infantof Villagomez: Sela Hilding Treating Zackariah Vanderpol/Extender: Tito Dine in Treatment: 0 Visit Information Patient Arrived: Cane Jenna Time: 14:13 Accompanied By: self Transfer Assistance: None Patient Identification Verified: Yes Secondary Verification Process Completed: Yes History Since Last Visit Added or deleted any medications: No Any new allergies or adverse reactions: No Had a fall or experienced change in activities of daily living that may affect risk of falls: No Signs or symptoms of abuse/neglect since last visito No Hospitalized since last visit: No Implantable device outside of the clinic excluding cellular tissue based products placed in the center since last visit: No Electronic Signature(s) Signed: 11/03/2019 10:34:41 AM By: Lorine Bears RCP, RRT, CHT Entered By: Lorine Bears on 11/02/2019 14:13:52 Jenna [name] ([id number]) -------------------------------------------------------------------------------- Clinic Level of Care Assessment [name] Patient Name: Jenna [name] Date of Service: 11/02/2019 2:15 PM Medical Record Number: [id number] Patient Account Number: [id number] Date of Birth/Sex: 25-Apr-1931 (85 y.o. F) Treating RN: Cornell Barman Primary Care Marlisha Vanwyk: Lujean Amel Other Clinician: Referring Lois Slagel: Sela Hilding Treating Marytza Grandpre/Extender: Tito Dine in Treatment: 0 Clinic Level of Care Assessment Items TOOL 1 Quantity Score []  - Use when EandM and Procedure is performed on INITIAL visit 0 ASSESSMENTS - Nursing Assessment / Reassessment X - General Physical Exam (combine w/ comprehensive assessment (listed just below) when 1 20 performed on new pt. evals) X- 1 25 Comprehensive Assessment (HX, ROS, Risk Assessments, Wounds Hx, etc.) ASSESSMENTS - Wound and Skin Assessment / Reassessment []  - Dermatologic / Skin Assessment (not related to wound area) 0 ASSESSMENTS - Ostomy and/or Continence Assessment and Care []  - Incontinence Assessment and Management 0 []  - 0 Ostomy Care Assessment and Management (repouching, etc.) PROCESS - Coordination of Care X - Simple Patient / Family Education for ongoing care 1 15 []  - 0 Complex (extensive) Patient / Family Education for ongoing care []  - 0 Staff obtains Programmer, systems, Records, Test Results / Process Orders []  - 0 Staff telephones HHA, Nursing Homes / Clarify orders / etc []  - 0 Routine Transfer to another Facility (non-emergent condition) []  - 0 Routine Hospital Admission (non-emergent condition) X- 1 15 New Admissions / Biomedical engineer / Ordering NPWT, Apligraf, etc. []  - 0 Emergency Hospital Admission (emergent condition) PROCESS - Special Needs []  - Pediatric / Minor Patient  Management 0 []  - 0 Isolation Patient Management []  - 0 Hearing / Language / Visual special needs []  - 0 Assessment of Community assistance (transportation, D/C planning, etc.) []  - 0 Additional assistance / Altered mentation []  - 0 Support Surface(s) Assessment (bed, cushion, seat, etc.) CHLOE, ADGATE. ([id number]) INTERVENTIONS - Miscellaneous []  - External ear exam 0 []  - 0 Patient

## 2019-11-04 NOTE — Progress Notes (Signed)
Jenna, [name] ([id number]) Visit Report for 11/02/2019 Chief Complaint Document Details Patient Name: Jenna [name], Jenna [name]. Date of Service: 11/02/2019 2:15 PM Medical Record Number: [id number] Patient Account Number: [id number] Date of Birth/Sex: 16-Dec-1931 (85 y.o. F) Treating RN: Cornell Barman Primary Care Provider: Lujean Amel Other Clinician: Referring Provider: Sela Hilding Treating Provider/Extender: Tito Dine in Treatment: 0 Information Obtained from: Patient Chief Complaint Patient presents for treatment of an open ulcer due to venous insufficiency the left lower extremity she's had for several months now 05/26/18; patient presents for opening and drainage of the left medial ankle which is the same place where she had a wound previously in this clinic. 11/02/2019; patient is here for review of wound on her left medial lower leg Electronic Signature(s) Signed: 11/04/2019 7:47:21 AM By: Linton Ham MD Entered By: Linton Ham on 11/02/2019 16:07:19 Ringgold, [name] ([id number]) -------------------------------------------------------------------------------- HPI Details Patient Name: Jenna [name] Date of Service: 11/02/2019 2:15 PM Medical Record Number: [id number] Patient Account Number: [id number] Date of Birth/Sex: 04/01/1931 (85 y.o. F) Treating RN: Cornell Barman Primary Care Provider: Lujean Amel Other Clinician: Referring Provider: Sela Hilding Treating Provider/Extender: Tito Dine in Treatment: 0 History of Present Illness Location: left lower extremity in the medial ankle region Quality: Patient reports experiencing a dull pain to affected area(s). Severity: Patient states wound are getting worse. Duration: Patient has had the wound for > 3 months prior to seeking treatment at the wound center Timing: Pain in wound is Intermittent (comes and goes Context: The wound appeared gradually over time Modifying Factors:  Other treatment(s) tried include:[name] Associated Signs and Symptoms: Patient reports having increase swelling. HPI Description: 85 year old patient with a past medical history significant for venous stasis ulceration and diabetes mellitus was recently admitted to the hospital between 07/07/2016 and 07/09/2016. She was wound to have a nonpurulent cellulitis of the left lower extremity and was started on IV antibiotics which included vancomycin. He is discharged home with her and Unna's boots and oral doxycycline. During this admission there was no obvious DVT involving the left lower extremity. her past medical history significant for diabetes mellitus, hypertension, hyperlipidemia, varicose veins, status post right rotator cuff repair, knee surgery on the left,robotic abdominal hysterectomy, laparoscopic cholecystectomy. She is not a smoker. Venous study done on 07/18/2015 showed normal left extremity deep venous system with no evidence of DVT. There was extensive valvular incompetence and reflux throughout the left greater saphenous vein with direct the medication to subcutaneous varicose veins. Normal left small saphenous vein. I understand she was seen by vascular radiology group and the workup and recommendation was for endovenous ablation but due to family pressure she was not able to keep her appointment a year ago. She has not been wearing her compression stockings regularly. 05/26/18 READMISSION this is an 85 year old woman who was previously seen in this clinic in 2017 by Dr. Con Memos. She has chronic venous insufficiency with lymphedema and at that time had open area on the left medial calf and ankle area. Was recommended that she wear 20- 30 mm compression stockings and the patient tells me that she has been compliant with this although I think her primary doctor recently told her that she didn't need to wear stockings out of fear it  might cause arterial compression. She noticed edema and pain in the area a week to 2 ago. She saw her primary doctor and was given doxycycline and Silvadene cream to put on  the area. She states things are a lot better. The patient has a history of type 2 diabetes on oral agents but she is unaware of her hemoglobin A1c for her blood glucose which she doesn't check. She has hypertension, varicose veins, rotator cuff repair, knee surgery on the left, history of a laparoscopic cholecystectomy, lymphedema, obstructive sleep apnea, history of endometrial CA. The patient has been to see vein and vascular in the past and I think has had an ablation of the left greater saphenous vein based on ultrasounds of the left leg IC dating back to 2017. Her most recent ultrasound was in May 2018 which showed no evidence of a DVT and continued durable closure of the treated segment of the left greater saphenous vein. Patent lateral accessory branch of the greater saphenous vein extending to the calf ABIs in our clinic were 1.05 on the right and 0.95 on the left 06/02/18 the patient's wound on the left medial lower calf is completely closed and epithelialized. She has new 20-30 mm below- knee stockings READMISSION 11/02/2019 Jenna [name], Jenna [name] ([id number]) This is a now 85 year old woman who has been in this clinic 2 times before. Most recently in 2019. She has chronic venous insufficiency with some degree of secondary lymphedema. She has had a history of a left greater saphenous vein ablation. When she is here in 2019 she had a wound on the left medial lower calf. This closed fairly easily. It was recommended she wear 20/30 mm below-knee stockings she is not compliant with this. She arrives in clinic with a 2-week history of a left medial lower extremity and ankle wound. Some of this has dry slough on it but most of it is already epithelialize she has been using a combination of Vaseline and/or Silvadene. She is not  wearing any compression. She has a history of chronic venous reflux. There was recommendations in the past for ablations I do not know that she ever carried through with this. According to notes from 2016 this work-up was done via the interventional radiology group. We will need to research this. She is probably going to need a consultation ABIs in our clinic were 1.03 on the right and 0.93 on the left Electronic Signature(s) Signed: 11/04/2019 7:47:21 AM By: Linton Ham MD Entered By: Linton Ham on 11/03/2019 07:51:54 Jenna [name] ([id number]) -------------------------------------------------------------------------------- Physical Exam Details Patient Name: Jenna [name] Date of Service: 11/02/2019 2:15 PM Medical Record Number: [id number] Patient Account Number: [id number] Date of Birth/Sex: 1931-04-02 (85 y.o. F) Treating RN: Cornell Barman Primary Care Provider: Lujean Amel Other Clinician: Referring Provider: Sela Hilding Treating Provider/Extender: Tito Dine in Treatment: 0 Constitutional Patient is hypertensive.. Pulse regular and within target range for patient.Marland Kitchen Respirations regular, non-labored and within target range.. Temperature is normal and within the target range for the patient.Marland Kitchen appears in no distress. Eyes Conjunctivae clear. No discharge. Respiratory Respiratory effort is easy and symmetric bilaterally. Rate is normal at rest and on room air.. Bilateral breath sounds are clear and equal in all lobes with no wheezes, rales or rhonchi.. Cardiovascular Heart rhythm and rate regular, without murmur or gallop.. Femoral pulses palpable. Pedal pulses palpable. Edema present in both extremities. This is generally mild and nonpitting. In the entire area of the left medial lower leg and left ankle. Really marked changes of chronic venous hypertension/inflammation significant skin damage there are less changes on the  right leg.Marland Kitchen Lymphatic None palpable in the popliteal area. Integumentary (Hair, Skin) Significant changes of chronic  venous insufficiency especially in the left lower leg and ankle. Probably some degree of chronic venous inflammation. Psychiatric No evidence of depression, anxiety, or agitation. Calm, cooperative, and communicative. Appropriate interactions and affect.. Notes Wound exam; the area questions on the left medial lower leg. The wound really is horizontal and linear. Much of this is already epithelialized. There is some surface slough although I elected not to debride this today. Although there are significant changes of chronic venous hypertension and inflammation,, I did not think there were any changes of cellulitis Electronic Signature(s) Signed: 11/04/2019 7:47:21 AM By: Linton Ham MD Entered By: Linton Ham on 11/03/2019 07:54:24 Jenna [name] ([id number]) -------------------------------------------------------------------------------- Physician Orders Details Patient Name: Jenna [name] Date of Service: 11/02/2019 2:15 PM Medical Record Number: [id number] Patient Account Number: [id number] Date of Birth/Sex: 1931-10-25 (85 y.o. F) Treating RN: Cornell Barman Primary Care Provider: Lujean Amel Other Clinician: Referring Provider: Sela Hilding Treating Provider/Extender: Tito Dine in Treatment: 0 Verbal / Phone Orders: No Diagnosis Coding Wound Cleansing Wound #3 Left,Medial Lower Leg o Clean wound with Normal Saline. o May shower with protection. Anesthetic (add to Medication List) Wound #3 Left,Medial Lower Leg o Topical Lidocaine 4% cream applied to wound bed prior to debridement (In Clinic Only). Skin Barriers/Peri-Wound Care Wound #3 Left,Medial Lower Leg o Barrier cream o Moisturizing lotion Primary Wound Dressing Wound #3 Left,Medial Lower Leg o Silver Alginate Secondary Dressing Wound #3 Left,Medial  Lower Leg o Dry Gauze Dressing Change Frequency Wound #3 Left,Medial Lower Leg o Change dressing every week o Other: - Nurse Visit if wrap slides down Follow-up Appointments Wound #3 Left,Medial Lower Leg o Return Appointment in 1 week. o Nurse Visit as needed Edema Control Wound #3 Left,Medial Lower Leg o 3 Layer Compression System - Left Lower Extremity Additional Orders / Instructions Wound #3 Left,Medial Lower Leg o Increase protein intake. Jenna [name], Jenna [name] ([id number]) Services and Therapies o Venous Studies -Bilateral - Left lower open wound Electronic Signature(s) Signed: 11/03/2019 5:19:38 PM By: Gretta Cool, [name], RN, CWS, [name], [name] Signed: 11/04/2019 7:47:21 AM By: Linton Ham MD Entered By: Gretta Cool, [name], RN, CWS, Kim on 11/02/2019 15:15:15 Anwar, Masten [name] ([id number]) -------------------------------------------------------------------------------- Problem List Details Patient Name: Jenna [name], Jenna [name]. Date of Service: 11/02/2019 2:15 PM Medical Record Number: [id number] Patient Account Number: [id number] Date of Birth/Sex: April 05, 1931 (85 y.o. F) Treating RN: Cornell Barman Primary Care Provider: Lujean Amel Other Clinician: Referring Provider: Sela Hilding Treating Provider/Extender: Tito Dine in Treatment: 0 Active Problems ICD-10 Evaluated Encounter Code Description Active Date Today Diagnosis I87.321 Chronic venous hypertension (idiopathic) with inflammation of 11/02/2019 No Yes right lower extremity L97.221 Non-pressure chronic ulcer of left calf limited to breakdown of 11/02/2019 No Yes skin Inactive Problems Resolved Problems Electronic Signature(s) Signed: 11/04/2019 7:47:21 AM By: Linton Ham MD Entered By: Linton Ham on 11/02/2019 15:45:35 Vanderweide, [name] ([id number]) -------------------------------------------------------------------------------- Progress Note Details Patient Name: Jenna [name] Date  of Service: 11/02/2019 2:15 PM Medical Record Number: [id number] Patient Account Number: [id number] Date of Birth/Sex: 1931-04-30 (85 y.o. F) Treating RN: Cornell Barman Primary Care Provider: Lujean Amel Other Clinician: Referring Provider: Sela Hilding Treating Provider/Extender: Tito Dine in Treatment: 0 Subjective Chief Complaint Information obtained from Patient Patient presents for treatment of an open ulcer due to venous insufficiency the left lower extremity she's had for several months now 05/26/18; patient presents for opening and drainage of the left medial ankle which is the same place where she had a wound previously  in this clinic. 11/02/2019; patient is here for review of wound on her left medial lower leg History of Present Illness (HPI) The following HPI elements were documented for the patient's wound: Location: left lower extremity in the medial ankle region Quality: Patient reports experiencing a dull pain to affected area(s). Severity: Patient states wound are getting worse. Duration: Patient has had the wound for > 3 months prior to seeking treatment at the wound center Timing: Pain in wound is Intermittent (comes and goes Context: The wound appeared gradually over time Modifying Factors: Other treatment(s) tried include:[name] Associated Signs and Symptoms: Patient reports having increase swelling. 85 year old patient with a past medical history significant for venous stasis ulceration and diabetes mellitus was recently admitted to the hospital between 07/07/2016 and 07/09/2016. She was wound to have a nonpurulent cellulitis of the left lower extremity and was started on IV antibiotics which included vancomycin. He is discharged home with her and Unna's boots and oral doxycycline. During this admission there was no obvious DVT involving the left lower extremity. her past medical history  significant for diabetes mellitus, hypertension, hyperlipidemia, varicose veins, status post right rotator cuff repair, knee surgery on the left,robotic abdominal hysterectomy, laparoscopic cholecystectomy. She is not a smoker. Venous study done on 07/18/2015 showed normal left extremity deep venous system with no evidence of DVT. There was extensive valvular incompetence and reflux throughout the left greater saphenous vein with direct the medication to subcutaneous varicose veins. Normal left small saphenous vein. I understand she was seen by vascular radiology group and the workup and recommendation was for endovenous ablation but due to family pressure she was not able to keep her appointment a year ago. She has not been wearing her compression stockings regularly. 05/26/18 READMISSION this is an 85 year old woman who was previously seen in this clinic in 2017 by Dr. Con Memos. She has chronic venous insufficiency with lymphedema and at that time had open area on the left medial calf and ankle area. Was recommended that she wear 20- 30 mm compression stockings and the patient tells me that she has been compliant with this although I think her primary doctor recently told her that she didn't need to wear stockings out of fear it might cause arterial compression. She noticed edema and pain in the area a week to 2 ago. She saw her primary doctor and was given doxycycline and Silvadene cream to put on the area. She states things are a lot better. The patient has a history of type 2 diabetes on oral agents but she is unaware of her hemoglobin A1c for her blood glucose which she doesn't check. She has hypertension, varicose veins, rotator cuff repair, knee surgery on the left, history of a laparoscopic cholecystectomy, lymphedema, obstructive sleep apnea, history of endometrial CA. The patient has been to see vein and vascular in the past and I think has had an ablation of the left greater saphenous  vein based on ultrasounds of the left leg IC dating back to 2017. Her most recent ultrasound was in May 2018 which showed no Jenna [name], Jenna [name]. ([id number]) evidence of a DVT and continued durable closure of the treated segment of the left greater saphenous vein. Patent lateral accessory branch of the greater saphenous vein extending to the calf ABIs in our clinic were 1.05 on the right and 0.95 on the left 06/02/18 the patient's wound on the left medial lower calf is completely closed and epithelialized. She has new  20-30 mm below- knee stockings READMISSION 11/02/2019 This is a now 85 year old woman who has been in this clinic 2 times before. Most recently in 2019. She has chronic venous insufficiency with some degree of secondary lymphedema. She has had a history of a left greater saphenous vein ablation. When she is here in 2019 she had a wound on the left medial lower calf. This closed fairly easily. It was recommended she wear 20/30 mm below-knee stockings she is not compliant with this. She arrives in clinic with a 2-week history of a left medial lower extremity and ankle wound. Some of this has dry slough on it but most of it is already epithelialize she has been using a combination of Vaseline and/or Silvadene. She is not wearing any compression. She has a history of chronic venous reflux. There was recommendations in the past for ablations I do not know that she ever carried through with this. According to notes from 2016 this work-up was done via the interventional radiology group. We will need to research this. She is probably going to need a consultation ABIs in our clinic were 1.03 on the right and 0.93 on the left Patient History Information obtained from Patient. Allergies Shellfish (Severity: Severe, Reaction: anaphlaxsis), Ancef (Severity: Severe, Reaction: nausea and vomiting) Family History Cancer - Siblings, Diabetes - Siblings, Hypertension - Maternal Grandparents,Paternal  Grandparents,Mother,Father,Siblings, Lung Disease - Siblings, No family history of Heart Disease, Hereditary Spherocytosis, Kidney Disease, Seizures, Stroke, Thyroid Problems, Tuberculosis. Social History Never smoker, Marital Status - Married, Alcohol Use - Never, Drug Use - No History, Caffeine Use - Daily. Medical History Eyes Patient has history of Cataracts - surgery, Glaucoma Ear/Nose/Mouth/Throat Denies history of Chronic sinus problems/congestion, Middle ear problems Hematologic/Lymphatic Patient has history of Lymphedema Denies history of Anemia, Hemophilia, Human Immunodeficiency Virus, Sickle Cell Disease Respiratory Denies history of Aspiration, Asthma, Chronic Obstructive Pulmonary Disease (COPD), Pneumothorax, Sleep Apnea, Tuberculosis Cardiovascular Patient has history of Hypertension Denies history of Angina, Arrhythmia, Congestive Heart Failure, Coronary Artery Disease, Deep Vein Thrombosis, Hypotension, Myocardial Infarction, Peripheral Arterial Disease, Peripheral Venous Disease, Phlebitis, Vasculitis Gastrointestinal Denies history of Cirrhosis , Colitis, Crohn s, Hepatitis A, Hepatitis B, Hepatitis C Endocrine CAIAH, MCGLATHERY ([id number]) Patient has history of Type II Diabetes Genitourinary Denies history of End Stage Renal Disease Immunological Denies history of Lupus Erythematosus, Raynaud s, Scleroderma Integumentary (Skin) Denies history of History of Burn, History of pressure wounds Musculoskeletal Patient has history of Osteoarthritis Denies history of Gout, Rheumatoid Arthritis, Osteomyelitis Neurologic Denies history of Dementia, Neuropathy, Quadriplegia, Paraplegia, Seizure Disorder Oncologic Denies history of Received Chemotherapy, Received Radiation Psychiatric Denies history of Anorexia/bulimia, Confinement Anxiety Objective Constitutional Patient is hypertensive.. Pulse regular and within target range for patient.Marland Kitchen Respirations regular,  non-labored and within target range.. Temperature is normal and within the target range for the patient.Marland Kitchen appears in no distress. Vitals Time Taken: 2:12 PM, Height: 61 in, Source: Stated, Weight: 220 lbs, Source: Measured, BMI: 41.6, Temperature: 98.7 F, Pulse: 78 bpm, Respiratory Rate: 16 breaths/min, Blood Pressure: 147/60 mmHg. Eyes Conjunctivae clear. No discharge. Respiratory Respiratory effort is easy and symmetric bilaterally. Rate is normal at rest and on room air.. Bilateral breath sounds are clear and equal in all lobes with no wheezes, rales or rhonchi.. Cardiovascular Heart rhythm and rate regular, without murmur or gallop.. Femoral pulses palpable. Pedal pulses palpable. Edema present in both extremities. This is generally mild and nonpitting. In the entire area of the left medial lower leg and left ankle. Really marked changes  of chronic venous hypertension/inflammation significant skin damage there are less changes on the right leg.Marland Kitchen Lymphatic None palpable in the popliteal area. Psychiatric No evidence of depression, anxiety, or agitation. Calm, cooperative, and communicative. Appropriate interactions and affect.. General Notes: Wound exam; the area questions on the left medial lower leg. The wound really is horizontal and linear. Much of this is already epithelialized. There is some surface slough although I elected not to debride this today. Although there are Jenna [name], Jenna [name] ([id number]) significant changes of chronic venous hypertension and inflammation,, I did not think there were any changes of cellulitis Integumentary (Hair, Skin) Significant changes of chronic venous insufficiency especially in the left lower leg and ankle. Probably some degree of chronic venous inflammation. Wound #3 status is Open. Original cause of wound was Gradually Appeared. The wound is located on the Left,Medial Lower Leg. The wound measures 1cm length x 3.4cm width x 0.1cm depth;  2.67cm^2 area and 0.267cm^3 volume. There is Fat Layer (Subcutaneous Tissue) Exposed exposed. There is no tunneling or undermining noted. There is a small amount of serous drainage noted. The wound margin is flat and intact. There is small (1-33%) pale granulation within the wound bed. There is a large (67-100%) amount of necrotic tissue within the wound bed including Eschar and Adherent Slough. Assessment Active Problems ICD-10 Chronic venous hypertension (idiopathic) with inflammation of right lower extremity Non-pressure chronic ulcer of left calf limited to breakdown of skin Plan Wound Cleansing: Wound #3 Left,Medial Lower Leg: Clean wound with Normal Saline. May shower with protection. Anesthetic (add to Medication List): Wound #3 Left,Medial Lower Leg: Topical Lidocaine 4% cream applied to wound bed prior to debridement (In Clinic Only). Skin Barriers/Peri-Wound Care: Wound #3 Left,Medial Lower Leg: Barrier cream Moisturizing lotion Primary Wound Dressing: Wound #3 Left,Medial Lower Leg: Silver Alginate Secondary Dressing: Wound #3 Left,Medial Lower Leg: Dry Gauze Dressing Change Frequency: Wound #3 Left,Medial Lower Leg: Change dressing every week Other: - Nurse Visit if wrap slides down Follow-up Appointments: Wound #3 Left,Medial Lower Leg: Return Appointment in 1 week. Nurse Visit as needed Edema Control: Wound #3 Left,Medial Lower Leg: 3 Layer Compression System - Left Lower Extremity Jenna [name], Jenna [name] ([id number]) Additional Orders / Instructions: Wound #3 Left,Medial Lower Leg: Increase protein intake. Services and Therapies ordered were: Venous Studies -Bilateral - Left lower open wound 1. We put silver alginate on the remaining wound area which is not all that large and a 3 layer compression 2. Although I think this may be healed by his early as next week, I think this patient probably needs follow-up venous evaluation with either interventional radiology  or vein and vascular will need to see who she is seen in the past. I suspect she will need update of her reflux studies but I prefer to leave that in the vascular MDs hands. It is somewhat surprising given the amount of cutaneous damage in the area on the left medial lower leg and ankle that she has not had more problems than she seems to have had Electronic Signature(s) Signed: 11/04/2019 7:47:21 AM By: Linton Ham MD Entered By: Linton Ham on 11/03/2019 07:56:28 Jenna [name] ([id number]) -------------------------------------------------------------------------------- ROS/PFSH Details Patient Name: Jenna [name] Date of Service: 11/02/2019 2:15 PM Medical Record Number: [id number] Patient Account Number: [id number] Date of Birth/Sex: [birth year] (85 y.o. F) Treating RN: Harold Barban Primary Care Provider: Lujean Amel Other Clinician: Referring Provider: Sela Hilding Treating Provider/Extender: Tito Dine in Treatment: 0 Information Obtained From Patient Eyes Medical History:  Positive for: Cataracts - surgery; Glaucoma Ear/Nose/Mouth/Throat Medical History: Negative for: Chronic sinus problems/congestion; Middle ear problems Hematologic/Lymphatic Medical History: Positive for: Lymphedema Negative for: Anemia; Hemophilia; Human Immunodeficiency Virus; Sickle Cell Disease Respiratory Medical History: Negative for: Aspiration; Asthma; Chronic Obstructive Pulmonary Disease (COPD); Pneumothorax; Sleep Apnea; Tuberculosis Cardiovascular Medical History: Positive for: Hypertension Negative for: Angina; Arrhythmia; Congestive Heart Failure; Coronary Artery Disease; Deep Vein Thrombosis; Hypotension; Myocardial Infarction; Peripheral Arterial Disease; Peripheral Venous Disease; Phlebitis; Vasculitis Gastrointestinal Medical History: Negative for: Cirrhosis ; Colitis; Crohnos; Hepatitis A; Hepatitis B; Hepatitis C Endocrine Medical  History: Positive for: Type II Diabetes Time with diabetes: 15 years Treated with: Oral agents Blood sugar tested every day: Yes Tested : rarely Genitourinary Medical History: Negative for: End Stage Renal Disease Jenna [name], Jenna [name] ([id number]) Immunological Medical History: Negative for: Lupus Erythematosus; Raynaudos; Scleroderma Integumentary (Skin) Medical History: Negative for: History of Burn; History of pressure wounds Musculoskeletal Medical History: Positive for: Osteoarthritis Negative for: Gout; Rheumatoid Arthritis; Osteomyelitis Neurologic Medical History: Negative for: Dementia; Neuropathy; Quadriplegia; Paraplegia; Seizure Disorder Oncologic Medical History: Negative for: Received Chemotherapy; Received Radiation Psychiatric Medical History: Negative for: Anorexia/bulimia; Confinement Anxiety HBO Extended History Items Eyes: Eyes: Cataracts Glaucoma Immunizations Pneumococcal Vaccine: Received Pneumococcal Vaccination: No Implantable Devices None Family and Social History Cancer: Yes - Siblings; Diabetes: Yes - Siblings; Heart Disease: No; Hereditary Spherocytosis: No; Hypertension: Yes - Maternal Grandparents,Paternal Grandparents,Mother,Father,Siblings; Kidney Disease: No; Lung Disease: Yes - Siblings; Seizures: No; Stroke: No; Thyroid Problems: No; Tuberculosis: No; Never smoker; Marital Status - Married; Alcohol Use: Never; Drug Use: No History; Caffeine Use: Daily; Financial Concerns: No; Food, Clothing or Shelter Needs: No; Support System Lacking: No; Transportation Concerns: No Electronic Signature(s) Signed: 11/03/2019 4:15:06 PM By: Harold Barban Signed: 11/04/2019 7:47:21 AM By: Linton Ham MD Entered By: Harold Barban on 11/02/2019 14:24:30 Jenna [name] ([id number]) -------------------------------------------------------------------------------- SuperBill Details Patient Name: Jenna [name] Date of Service:  11/02/2019 Medical Record Number: [id number] Patient Account Number: [id number] Date of Birth/Sex: 1931/05/06 (85 y.o. F) Treating RN: Cornell Barman Primary Care Provider: Lujean Amel Other Clinician: Referring Provider: Sela Hilding Treating Provider/Extender: Tito Dine in Treatment: 0 Diagnosis Coding ICD-10 Codes Code Description [phone] Chronic venous hypertension (idiopathic) with inflammation of right lower extremity L97.221 Non-pressure chronic ulcer of left calf limited to breakdown of skin Facility Procedures CPT4 Code: [id number] Description: [place] VISIT-LEV 3 EST PT Modifier: Quantity: 1 CPT4 Code: [id number] Description: (Facility Use Only) [id number] - [place] COMPRS LWR LT LEG Modifier: Quantity: 1 Physician Procedures CPT4 Code Description: [id number] - WC PHYS LEVEL 4 - EST PT ICD-10 Diagnosis Description I87.321 Chronic venous hypertension (idiopathic) with inflammation of L97.221 Non-pressure chronic ulcer of left calf limited to breakdown Modifier: right lower extr of skin Quantity: 1 emity Electronic Signature(s) Signed: 11/04/2019 7:47:21 AM By: Linton Ham MD Entered By: Linton Ham on 11/03/2019 07:57:27

## 2019-11-09 ENCOUNTER — Other Ambulatory Visit: Payer: Self-pay

## 2019-11-09 ENCOUNTER — Encounter: Payer: Medicare Other | Admitting: Internal Medicine

## 2019-11-09 DIAGNOSIS — L97221 Non-pressure chronic ulcer of left calf limited to breakdown of skin: Secondary | ICD-10-CM | POA: Diagnosis not present

## 2019-11-09 DIAGNOSIS — I87333 Chronic venous hypertension (idiopathic) with ulcer and inflammation of bilateral lower extremity: Secondary | ICD-10-CM | POA: Diagnosis not present

## 2019-11-09 DIAGNOSIS — E785 Hyperlipidemia, unspecified: Secondary | ICD-10-CM | POA: Diagnosis not present

## 2019-11-09 DIAGNOSIS — M1990 Unspecified osteoarthritis, unspecified site: Secondary | ICD-10-CM | POA: Diagnosis not present

## 2019-11-09 DIAGNOSIS — S91002A Unspecified open wound, left ankle, initial encounter: Secondary | ICD-10-CM | POA: Diagnosis not present

## 2019-11-09 DIAGNOSIS — I87392 Chronic venous hypertension (idiopathic) with other complications of left lower extremity: Secondary | ICD-10-CM | POA: Diagnosis not present

## 2019-11-09 DIAGNOSIS — I10 Essential (primary) hypertension: Secondary | ICD-10-CM | POA: Diagnosis not present

## 2019-11-09 DIAGNOSIS — E119 Type 2 diabetes mellitus without complications: Secondary | ICD-10-CM | POA: Diagnosis not present

## 2019-11-10 ENCOUNTER — Encounter (INDEPENDENT_AMBULATORY_CARE_PROVIDER_SITE_OTHER): Payer: Self-pay

## 2019-11-10 ENCOUNTER — Other Ambulatory Visit (INDEPENDENT_AMBULATORY_CARE_PROVIDER_SITE_OTHER): Payer: Self-pay | Admitting: Internal Medicine

## 2019-11-10 DIAGNOSIS — S81802S Unspecified open wound, left lower leg, sequela: Secondary | ICD-10-CM

## 2019-11-10 NOTE — Progress Notes (Signed)
HOLLEIGH, COLESON ([id number]) Visit Report for 11/09/2019 HPI Details Patient Name: Jenna [name], Jenna [name]. Date of Service: 11/09/2019 10:45 AM Medical Record Number: [id number] Patient Account Number: [id number] Date of Birth/Sex: 1931/01/28 (85 y.o. F) Treating RN: Cornell Barman Primary Care Provider: Lujean Amel Other Clinician: Referring Provider: Lujean Amel Treating Provider/Extender: Tito Dine in Treatment: 1 History of Present Illness Location: left lower extremity in the medial ankle region Quality: Patient reports experiencing a dull pain to affected area(s). Severity: Patient states wound are getting worse. Duration: Patient has had the wound for > 3 months prior to seeking treatment at the wound center Timing: Pain in wound is Intermittent (comes and goes Context: The wound appeared gradually over time Modifying Factors: Other treatment(s) tried include:[name] Associated Signs and Symptoms: Patient reports having increase swelling. HPI Description: 85 year old patient with a past medical history significant for venous stasis ulceration and diabetes mellitus was recently admitted to the hospital between 07/07/2016 and 07/09/2016. She was wound to have a nonpurulent cellulitis of the left lower extremity and was started on IV antibiotics which included vancomycin. He is discharged home with her and Unna's boots and oral doxycycline. During this admission there was no obvious DVT involving the left lower extremity. her past medical history significant for diabetes mellitus, hypertension, hyperlipidemia, varicose veins, status post right rotator cuff repair, knee surgery on the left,robotic abdominal hysterectomy, laparoscopic cholecystectomy. She is not a smoker. Venous study done on 07/18/2015 showed normal left extremity deep venous system with no evidence of DVT. There was extensive valvular incompetence and  reflux throughout the left greater saphenous vein with direct the medication to subcutaneous varicose veins. Normal left small saphenous vein. I understand she was seen by vascular radiology group and the workup and recommendation was for endovenous ablation but due to family pressure she was not able to keep her appointment a year ago. She has not been wearing her compression stockings regularly. 05/26/18 READMISSION this is an 85 year old woman who was previously seen in this clinic in 2017 by Dr. Con Memos. She has chronic venous insufficiency with lymphedema and at that time had open area on the left medial calf and ankle area. Was recommended that she wear 20- 30 mm compression stockings and the patient tells me that she has been compliant with this although I think her primary doctor recently told her that she didn't need to wear stockings out of fear it might cause arterial compression. She noticed edema and pain in the area a week to 2 ago. She saw her primary doctor and was given doxycycline and Silvadene cream to put on the area. She states things are a lot better. The patient has a history of type 2 diabetes on oral agents but she is unaware of her hemoglobin A1c for her blood glucose which she doesn't check. She has hypertension, varicose veins, rotator cuff repair, knee surgery on the left, history of a laparoscopic cholecystectomy, lymphedema, obstructive sleep apnea, history of endometrial CA. The patient has been to see vein and vascular in the past and I think has had an ablation of the left greater saphenous vein based on ultrasounds of the left leg IC dating back to 2017. Her most recent ultrasound was in May 2018 which showed no evidence of a DVT and continued durable closure of the treated segment of the left greater saphenous vein. Patent lateral accessory branch of the greater saphenous vein extending to the calf ABIs in our  clinic were 1.05 on the right and 0.95 on the  left 06/02/18 the patient's wound on the left medial lower calf is completely closed and epithelialized. She has new 20-30 mm below- knee stockings Jenna [name], Jenna [name] ([id number]) READMISSION 11/02/2019 This is a now 85 year old woman who has been in this clinic 2 times before. Most recently in 2019. She has chronic venous insufficiency with some degree of secondary lymphedema. She has had a history of a left greater saphenous vein ablation. When she is here in 2019 she had a wound on the left medial lower calf. This closed fairly easily. It was recommended she wear 20/30 mm below-knee stockings she is not compliant with this. She arrives in clinic with a 2-week history of a left medial lower extremity and ankle wound. Some of this has dry slough on it but most of it is already epithelialize she has been using a combination of Vaseline and/or Silvadene. She is not wearing any compression. She has a history of chronic venous reflux. There was recommendations in the past for ablations I do not know that she ever carried through with this. According to notes from 2016 this work-up was done via the interventional radiology group. We will need to research this. She is probably going to need a consultation ABIs in our clinic were 1.03 on the right and 0.93 on the left 11/11; patient's wound looks as though it is epithelializing horizontal wound on the left medial lower extremity. She has a superior satellite lesion. She is complaining of a lot of pain in 3 layer compression. There have been recommendations for previous ablations I am not sure if she followed up on this. She has been noncompliant with stockings although she brought those into the clinic today. Electronic Signature(s) Signed: 11/09/2019 5:41:49 PM By: Linton Ham MD Entered By: Linton Ham on 11/09/2019 10:46:44 Jenna [name]  ([id number]) -------------------------------------------------------------------------------- Physical Exam Details Patient Name: Jenna [name], Jenna [name] Date of Service: 11/09/2019 10:45 AM Medical Record Number: [id number] Patient Account Number: [id number] Date of Birth/Sex: 14-Feb-1931 (85 y.o. F) Treating RN: Cornell Barman Primary Care Provider: Lujean Amel Other Clinician: Referring Provider: Lujean Amel Treating Provider/Extender: Tito Dine in Treatment: 1 Constitutional Patient is hypertensive.. Pulse regular and within target range for patient.Marland Kitchen Respirations regular, non-labored and within target range.. Temperature is normal and within the target range for the patient.Marland Kitchen appears in no distress. Eyes Conjunctivae clear. No discharge. Respiratory Respiratory effort is easy and symmetric bilaterally. Rate is normal at rest and on room air.. Cardiovascular Pedal pulses are palpable. Integumentary (Hair, Skin) Chronic venous hypertension hemosiderin deposition around the wound area. I see no evidence of infection. Psychiatric No evidence of depression, anxiety, or agitation. Calm, cooperative, and communicative. Appropriate interactions and affect.. Notes Wound exam; hearing questions on the left medial lower leg. Horizontal linear wound. There is still openings here although I think the degree of epithelialization looks better. No debridement was done although that may be necessary. She has significant changes of venous hypertension and inflammation but no evidence of cellulitis Electronic Signature(s) Signed: 11/09/2019 5:41:49 PM By: Linton Ham MD Entered By: Linton Ham on 11/09/2019 10:50:22 Jenna [name] ([id number]) -------------------------------------------------------------------------------- Physician Orders Details Patient Name: Jenna [name] Date of Service: 11/09/2019 10:45 AM Medical Record Number: [id number] Patient Account Number:  [id number] Date of Birth/Sex: 05-Apr-1931 (85 y.o. F) Treating RN: Cornell Barman Primary Care Provider: Lujean Amel Other Clinician: Referring Provider: Lujean Amel Treating Provider/Extender: Tito Dine in Treatment: 1 Verbal /  Phone Orders: No Diagnosis Coding Wound Cleansing Wound #3 Left,Medial Lower Leg o Clean wound with Normal Saline. o May shower with protection. Anesthetic (add to Medication List) Wound #3 Left,Medial Lower Leg o Topical Lidocaine 4% cream applied to wound bed prior to debridement (In Clinic Only). Skin Barriers/Peri-Wound Care Wound #3 Left,Medial Lower Leg o Barrier cream o Moisturizing lotion Primary Wound Dressing Wound #3 Left,Medial Lower Leg o Silver Alginate Secondary Dressing Wound #3 Left,Medial Lower Leg o Dry Gauze Dressing Change Frequency Wound #3 Left,Medial Lower Leg o Change dressing every week o Other: - Nurse Visit if wrap slides down Follow-up Appointments Wound #3 Left,Medial Lower Leg o Return Appointment in 1 week. o Nurse Visit as needed Edema Control Wound #3 Left,Medial Lower Leg o Kerlix and Coban - Left Lower Extremity Additional Orders / Instructions Wound #3 Left,Medial Lower Leg o Increase protein intake. Jenna [name], Jenna [name] ([id number]) Electronic Signature(s) Signed: 11/09/2019 5:21:25 PM By: Gretta Cool, [name], RN, CWS, [name], [name] Signed: 11/09/2019 5:41:49 PM By: Linton Ham MD Entered By: Gretta Cool, [name], RN, CWS, Kim on 11/09/2019 10:40:10 Jenna [name], Jenna [name] ([id number]) -------------------------------------------------------------------------------- Problem List Details Patient Name: AALAYSIA, BARTHELEMY. Date of Service: 11/09/2019 10:45 AM Medical Record Number: [id number] Patient Account Number: [id number] Date of Birth/Sex: [birth year] (85 y.o. F) Treating RN: Cornell Barman Primary Care Provider: Lujean Amel Other Clinician: Referring Provider: Lujean Amel Treating  Provider/Extender: Tito Dine in Treatment: 1 Active Problems ICD-10 Evaluated Encounter Code Description Active Date Today Diagnosis I87.321 Chronic venous hypertension (idiopathic) with inflammation of 11/02/2019 No Yes right lower extremity L97.221 Non-pressure chronic ulcer of left calf limited to breakdown of 11/02/2019 No Yes skin Inactive Problems Resolved Problems Electronic Signature(s) Signed: 11/09/2019 5:41:49 PM By: Linton Ham MD Entered By: Linton Ham on 11/09/2019 10:45:07 Jenna [name] ([id number]) -------------------------------------------------------------------------------- Progress Note Details Patient Name: Jenna [name] Date of Service: 11/09/2019 10:45 AM Medical Record Number: [id number] Patient Account Number: [id number] Date of Birth/Sex: 02-22-1931 (85 y.o. F) Treating RN: Cornell Barman Primary Care Provider: Lujean Amel Other Clinician: Referring Provider: Lujean Amel Treating Provider/Extender: Tito Dine in Treatment: 1 Subjective History of Present Illness (HPI) The following HPI elements were documented for the patient's wound: Location: left lower extremity in the medial ankle region Quality: Patient reports experiencing a dull pain to affected area(s). Severity: Patient states wound are getting worse. Duration: Patient has had the wound for > 3 months prior to seeking treatment at the wound center Timing: Pain in wound is Intermittent (comes and goes Context: The wound appeared gradually over time Modifying Factors: Other treatment(s) tried include:[name] Associated Signs and Symptoms: Patient reports having increase swelling. 85 year old patient with a past medical history significant for venous stasis ulceration and diabetes mellitus was recently admitted to the hospital between 07/07/2016 and 07/09/2016. She was wound to have a nonpurulent  cellulitis of the left lower extremity and was started on IV antibiotics which included vancomycin. He is discharged home with her and Unna's boots and oral doxycycline. During this admission there was no obvious DVT involving the left lower extremity. her past medical history significant for diabetes mellitus, hypertension, hyperlipidemia, varicose veins, status post right rotator cuff repair, knee surgery on the left,robotic abdominal hysterectomy, laparoscopic cholecystectomy. She is not a smoker. Venous study done on 07/18/2015 showed normal left extremity deep venous system with no evidence of DVT. There was extensive valvular incompetence and reflux throughout the left greater saphenous vein with  direct the medication to subcutaneous varicose veins. Normal left small saphenous vein. I understand she was seen by vascular radiology group and the workup and recommendation was for endovenous ablation but due to family pressure she was not able to keep her appointment a year ago. She has not been wearing her compression stockings regularly. 05/26/18 READMISSION this is an 85 year old woman who was previously seen in this clinic in 2017 by Dr. Con Memos. She has chronic venous insufficiency with lymphedema and at that time had open area on the left medial calf and ankle area. Was recommended that she wear 20- 30 mm compression stockings and the patient tells me that she has been compliant with this although I think her primary doctor recently told her that she didn't need to wear stockings out of fear it might cause arterial compression. She noticed edema and pain in the area a week to 2 ago. She saw her primary doctor and was given doxycycline and Silvadene cream to put on the area. She states things are a lot better. The patient has a history of type 2 diabetes on oral agents but she is unaware of her hemoglobin A1c for her blood glucose which she doesn't check. She has hypertension, varicose veins,  rotator cuff repair, knee surgery on the left, history of a laparoscopic cholecystectomy, lymphedema, obstructive sleep apnea, history of endometrial CA. The patient has been to see vein and vascular in the past and I think has had an ablation of the left greater saphenous vein based on ultrasounds of the left leg IC dating back to 2017. Her most recent ultrasound was in May 2018 which showed no evidence of a DVT and continued durable closure of the treated segment of the left greater saphenous vein. Patent lateral accessory branch of the greater saphenous vein extending to the calf ABIs in our clinic were 1.05 on the right and 0.95 on the left 06/02/18 the patient's wound on the left medial lower calf is completely closed and epithelialized. She has new 20-30 mm below- knee stockings Jenna [name], Jenna [name] ([id number]) READMISSION 11/02/2019 This is a now 85 year old woman who has been in this clinic 2 times before. Most recently in 2019. She has chronic venous insufficiency with some degree of secondary lymphedema. She has had a history of a left greater saphenous vein ablation. When she is here in 2019 she had a wound on the left medial lower calf. This closed fairly easily. It was recommended she wear 20/30 mm below-knee stockings she is not compliant with this. She arrives in clinic with a 2-week history of a left medial lower extremity and ankle wound. Some of this has dry slough on it but most of it is already epithelialize she has been using a combination of Vaseline and/or Silvadene. She is not wearing any compression. She has a history of chronic venous reflux. There was recommendations in the past for ablations I do not know that she ever carried through with this. According to notes from 2016 this work-up was done via the interventional radiology group. We will need to research this. She is probably going to need a consultation ABIs in our clinic were 1.03 on the right and 0.93 on the  left 11/11; patient's wound looks as though it is epithelializing horizontal wound on the left medial lower extremity. She has a superior satellite lesion. She is complaining of a lot of pain in 3 layer compression. There have been recommendations for previous ablations I am not sure if she followed  up on this. She has been noncompliant with stockings although she brought those into the clinic today. Objective Constitutional Patient is hypertensive.. Pulse regular and within target range for patient.Marland Kitchen Respirations regular, non-labored and within target range.. Temperature is normal and within the target range for the patient.Marland Kitchen appears in no distress. Vitals Time Taken: 10:23 AM, Height: 61 in, Weight: 220 lbs, BMI: 41.6, Temperature: 98.1 F, Pulse: 84 bpm, Respiratory Rate: 16 breaths/min, Blood Pressure: 188/70 mmHg. Eyes Conjunctivae clear. No discharge. Respiratory Respiratory effort is easy and symmetric bilaterally. Rate is normal at rest and on room air.. Cardiovascular Pedal pulses are palpable. Psychiatric No evidence of depression, anxiety, or agitation. Calm, cooperative, and communicative. Appropriate interactions and affect.. General Notes: Wound exam; hearing questions on the left medial lower leg. Horizontal linear wound. There is still openings here although I think the degree of epithelialization looks better. No debridement was done although that may be necessary. She has significant changes of venous hypertension and inflammation but no evidence of cellulitis Integumentary (Hair, Skin) Chronic venous hypertension hemosiderin deposition around the wound area. I see no evidence of infection. Jenna [name], Jenna [name] ([id number]) Wound #3 status is Open. Original cause of wound was Gradually Appeared. The wound is located on the Left,Medial Lower Leg. The wound measures 2cm length x 3cm width x 0.1cm depth; 4.712cm^2 area and 0.471cm^3 volume. There is Fat Layer (Subcutaneous  Tissue) Exposed exposed. There is no tunneling or undermining noted. There is a small amount of serous drainage noted. The wound margin is flat and intact. There is small (1-33%) pale granulation within the wound bed. There is a large (67-100%) amount of necrotic tissue within the wound bed including Eschar and Adherent Slough. Assessment Active Problems ICD-10 Chronic venous hypertension (idiopathic) with inflammation of right lower extremity Non-pressure chronic ulcer of left calf limited to breakdown of skin Plan Wound Cleansing: Wound #3 Left,Medial Lower Leg: Clean wound with Normal Saline. May shower with protection. Anesthetic (add to Medication List): Wound #3 Left,Medial Lower Leg: Topical Lidocaine 4% cream applied to wound bed prior to debridement (In Clinic Only). Skin Barriers/Peri-Wound Care: Wound #3 Left,Medial Lower Leg: Barrier cream Moisturizing lotion Primary Wound Dressing: Wound #3 Left,Medial Lower Leg: Silver Alginate Secondary Dressing: Wound #3 Left,Medial Lower Leg: Dry Gauze Dressing Change Frequency: Wound #3 Left,Medial Lower Leg: Change dressing every week Other: - Nurse Visit if wrap slides down Follow-up Appointments: Wound #3 Left,Medial Lower Leg: Return Appointment in 1 week. Nurse Visit as needed Edema Control: Wound #3 Left,Medial Lower Leg: Kerlix and Coban - Left Lower Extremity Additional Orders / Instructions: Wound #3 Left,Medial Lower Leg: Increase protein intake. Jenna [name], Jenna [name] ([id number]) 1. Continue silver alginate to the wounds 2. Because of the complaints of pain I reduced her to kerlix Coban hopefully this will maintain her edema control. 3. I still need to look through venous reflux history here and vein and vascular recommendations which apparently include an ablation at one point although I do not think she ever did this. 4. She has stockings but is noncompliant with them. I have emphasized she is going to need to  wear these daily when this closes over Electronic Signature(s) Signed: 11/09/2019 5:41:49 PM By: Linton Ham MD Entered By: Linton Ham on 11/09/2019 10:52:13 Jenna [name], Jenna [name] ([id number]) -------------------------------------------------------------------------------- [place] [facility] Details Patient Name: Jenna [name] Date of Service: 11/09/2019 Medical Record Number: [id number] Patient Account Number: [id number] Date of Birth/Sex: [birth year] (85 y.o. F) Treating RN: Cornell Barman Primary Care Provider: Lujean Amel Other  Clinician: Referring Provider: Lujean Amel Treating Provider/Extender: Tito Dine in Treatment: 1 Diagnosis Coding ICD-10 Codes Code Description I87.321 Chronic venous hypertension (idiopathic) with inflammation of right lower extremity L97.221 Non-pressure chronic ulcer of left calf limited to breakdown of skin Facility Procedures CPT4 Code: [id number] Description: 99213 - WOUND CARE VISIT-LEV 3 EST PT Modifier: Quantity: 1 Physician Procedures CPT4 Code Description: [id number] [place] - WC PHYS LEVEL 3 - EST PT ICD-10 Diagnosis Description I87.321 Chronic venous hypertension (idiopathic) with inflammation of L97.221 Non-pressure chronic ulcer of left calf limited to breakdown Modifier: right lower extr of skin Quantity: 1 emity Electronic Signature(s) Signed: 11/09/2019 5:41:49 PM By: Linton Ham MD Entered By: Linton Ham on 11/09/2019 10:52:28

## 2019-11-10 NOTE — Progress Notes (Signed)
0 Complex Wound Measurement - multiple wounds INTERVENTIONS - Wound Dressings []  - Small Wound Dressing one or multiple wounds 0 []  - 0 Medium Wound Dressing one or multiple wounds X- 1 20 Large Wound Dressing one or multiple wounds []  - 0 Application of Medications - topical []  - 0 Application of Medications - injection INTERVENTIONS - Miscellaneous []  - External ear exam 0 []  - 0 Specimen Collection (cultures, biopsies, blood, body fluids, etc.) []  - 0 Specimen(s) / Culture(s) sent or taken to Lab for analysis []  - 0 Patient Transfer (multiple staff / [employer] fast streamer / Similar devices) []  - 0 Simple Staple / Suture removal (25 or less) []  - 0 Complex Staple / Suture removal (26 or more) []  - 0 Hypo / Hyperglycemic Management (close monitor of Blood Glucose) []  - 0 Ankle / Brachial Index (ABI) - do not check if billed separately X- 1 5 Vital Signs Jenna [name], Jenna [name] ([id number]) Has the patient been seen at the hospital within the last three years: Yes Total Score: 85 Level Of Care: New/Established - Level 3 Electronic Signature(s) Signed: 11/09/2019 5:21:25 PM By: Gretta Cool, [name], RN, CWS, [name], [name] Entered By: Gretta Cool, [name], RN, CWS, Kim on 11/09/2019 10:40:40 Jenna [name] ([id number]) -------------------------------------------------------------------------------- Encounter Discharge Information Details Patient Name: Jenna [name] Date of Service: 11/09/2019 10:45 AM Medical Record Number: [id number] Patient Account Number: [id number] Date of Birth/Sex: Sep 20, 1931 (85 y.o. F) Treating RN: Cornell Barman Primary Care Haygen Zebrowski: Lujean Amel Other Clinician: Referring Alizae Bechtel: Lujean Amel Treating Amylah Will/Extender: Tito Dine in Treatment: 1 Encounter Discharge Information Items Discharge Condition: Stable Ambulatory Status:  Cane Discharge Destination: Home Transportation: Private Auto Accompanied By: self Schedule Follow-up Appointment: Yes Clinical Summary of Care: Electronic Signature(s) Signed: 11/09/2019 5:21:25 PM By: Gretta Cool, [name], RN, CWS, [name], [name] Entered By: Gretta Cool, [name], RN, CWS, Kim on 11/09/2019 10:42:34 Jenna [name] ([id number]) -------------------------------------------------------------------------------- Lower Extremity Assessment Details Patient Name: Jenna [name] Date of Service: 11/09/2019 10:45 AM Medical Record Number: [id number] Patient Account Number: [id number] Date of Birth/Sex: 1931-12-18 (85 y.o. F) Treating RN: Army Melia Primary Care Keydi Giel: Lujean Amel Other Clinician: Referring Reginaldo Hazard: Lujean Amel Treating Navin Dogan/Extender: Ricard Dillon Weeks in Treatment: 1 Edema Assessment Assessed: [Left: No] [Right: No] Edema: [Left: N] [Right: o] Calf Left: Right: Point of Measurement: 32 cm From Medial Instep 44.5 cm cm Ankle Left: Right: Point of Measurement: 10 cm From Medial Instep 24.5 cm cm Vascular Assessment Pulses: Dorsalis Pedis Palpable: [Left:Yes] Electronic Signature(s) Signed: 11/10/2019 3:26:34 PM By: Army Melia Entered By: Army Melia on 11/09/2019 10:31:24 Jenna [name] ([id number]) -------------------------------------------------------------------------------- Multi Wound Chart Details Patient Name: Jenna [name] Date of Service: 11/09/2019 10:45 AM Medical Record Number: [id number] Patient Account Number: [id number] Date of Birth/Sex: [birth year] (85 y.o. F) Treating RN: Cornell Barman Primary Care Rafael Quesada: Lujean Amel Other Clinician: Referring Deren Degrazia: Lujean Amel Treating Truong Delcastillo/Extender: Tito Dine in Treatment: 1 Vital Signs Height(in): 61 Pulse(bpm): 84 Weight(lbs): 220 Blood Pressure(mmHg): 188/70 Body Mass Index(BMI): 42 Temperature(F): 98.1 Respiratory  Rate 16 (breaths/min): Photos: [N/A:N/A] Wound Location: Left Lower Leg - Medial N/A N/A Wounding Event: Gradually Appeared N/A N/A Primary Etiology: Diabetic Wound/Ulcer of the N/A N/A Lower Extremity Comorbid History: Cataracts, Glaucoma, N/A N/A Lymphedema, Hypertension, Type II Diabetes, Osteoarthritis Date Acquired: 08/30/2019 N/A N/A Weeks of Treatment: 1 N/A N/A Wound Status: Open N/A N/A Measurements L x W x D 2x3x0.1 N/A N/A (cm) Area (cm) : 4.712 N/A N/A Volume (cm) : 0.471 N/A N/A %  0 Complex Wound Measurement - multiple wounds INTERVENTIONS - Wound Dressings []  - Small Wound Dressing one or multiple wounds 0 []  - 0 Medium Wound Dressing one or multiple wounds X- 1 20 Large Wound Dressing one or multiple wounds []  - 0 Application of Medications - topical []  - 0 Application of Medications - injection INTERVENTIONS - Miscellaneous []  - External ear exam 0 []  - 0 Specimen Collection (cultures, biopsies, blood, body fluids, etc.) []  - 0 Specimen(s) / Culture(s) sent or taken to Lab for analysis []  - 0 Patient Transfer (multiple staff / [employer] fast streamer / Similar devices) []  - 0 Simple Staple / Suture removal (25 or less) []  - 0 Complex Staple / Suture removal (26 or more) []  - 0 Hypo / Hyperglycemic Management (close monitor of Blood Glucose) []  - 0 Ankle / Brachial Index (ABI) - do not check if billed separately X- 1 5 Vital Signs Jenna [name], Jenna [name] ([id number]) Has the patient been seen at the hospital within the last three years: Yes Total Score: 85 Level Of Care: New/Established - Level 3 Electronic Signature(s) Signed: 11/09/2019 5:21:25 PM By: Gretta Cool, [name], RN, CWS, [name], [name] Entered By: Gretta Cool, [name], RN, CWS, Kim on 11/09/2019 10:40:40 Jenna [name] ([id number]) -------------------------------------------------------------------------------- Encounter Discharge Information Details Patient Name: Jenna [name] Date of Service: 11/09/2019 10:45 AM Medical Record Number: [id number] Patient Account Number: [id number] Date of Birth/Sex: Sep 20, 1931 (85 y.o. F) Treating RN: Cornell Barman Primary Care Haygen Zebrowski: Lujean Amel Other Clinician: Referring Alizae Bechtel: Lujean Amel Treating Amylah Will/Extender: Tito Dine in Treatment: 1 Encounter Discharge Information Items Discharge Condition: Stable Ambulatory Status:  Cane Discharge Destination: Home Transportation: Private Auto Accompanied By: self Schedule Follow-up Appointment: Yes Clinical Summary of Care: Electronic Signature(s) Signed: 11/09/2019 5:21:25 PM By: Gretta Cool, [name], RN, CWS, [name], [name] Entered By: Gretta Cool, [name], RN, CWS, Kim on 11/09/2019 10:42:34 Jenna [name] ([id number]) -------------------------------------------------------------------------------- Lower Extremity Assessment Details Patient Name: Jenna [name] Date of Service: 11/09/2019 10:45 AM Medical Record Number: [id number] Patient Account Number: [id number] Date of Birth/Sex: 1931-12-18 (85 y.o. F) Treating RN: Army Melia Primary Care Keydi Giel: Lujean Amel Other Clinician: Referring Reginaldo Hazard: Lujean Amel Treating Navin Dogan/Extender: Ricard Dillon Weeks in Treatment: 1 Edema Assessment Assessed: [Left: No] [Right: No] Edema: [Left: N] [Right: o] Calf Left: Right: Point of Measurement: 32 cm From Medial Instep 44.5 cm cm Ankle Left: Right: Point of Measurement: 10 cm From Medial Instep 24.5 cm cm Vascular Assessment Pulses: Dorsalis Pedis Palpable: [Left:Yes] Electronic Signature(s) Signed: 11/10/2019 3:26:34 PM By: Army Melia Entered By: Army Melia on 11/09/2019 10:31:24 Jenna [name] ([id number]) -------------------------------------------------------------------------------- Multi Wound Chart Details Patient Name: Jenna [name] Date of Service: 11/09/2019 10:45 AM Medical Record Number: [id number] Patient Account Number: [id number] Date of Birth/Sex: [birth year] (85 y.o. F) Treating RN: Cornell Barman Primary Care Rafael Quesada: Lujean Amel Other Clinician: Referring Deren Degrazia: Lujean Amel Treating Truong Delcastillo/Extender: Tito Dine in Treatment: 1 Vital Signs Height(in): 61 Pulse(bpm): 84 Weight(lbs): 220 Blood Pressure(mmHg): 188/70 Body Mass Index(BMI): 42 Temperature(F): 98.1 Respiratory  Rate 16 (breaths/min): Photos: [N/A:N/A] Wound Location: Left Lower Leg - Medial N/A N/A Wounding Event: Gradually Appeared N/A N/A Primary Etiology: Diabetic Wound/Ulcer of the N/A N/A Lower Extremity Comorbid History: Cataracts, Glaucoma, N/A N/A Lymphedema, Hypertension, Type II Diabetes, Osteoarthritis Date Acquired: 08/30/2019 N/A N/A Weeks of Treatment: 1 N/A N/A Wound Status: Open N/A N/A Measurements L x W x D 2x3x0.1 N/A N/A (cm) Area (cm) : 4.712 N/A N/A Volume (cm) : 0.471 N/A N/A %  0 Complex Wound Measurement - multiple wounds INTERVENTIONS - Wound Dressings []  - Small Wound Dressing one or multiple wounds 0 []  - 0 Medium Wound Dressing one or multiple wounds X- 1 20 Large Wound Dressing one or multiple wounds []  - 0 Application of Medications - topical []  - 0 Application of Medications - injection INTERVENTIONS - Miscellaneous []  - External ear exam 0 []  - 0 Specimen Collection (cultures, biopsies, blood, body fluids, etc.) []  - 0 Specimen(s) / Culture(s) sent or taken to Lab for analysis []  - 0 Patient Transfer (multiple staff / [employer] fast streamer / Similar devices) []  - 0 Simple Staple / Suture removal (25 or less) []  - 0 Complex Staple / Suture removal (26 or more) []  - 0 Hypo / Hyperglycemic Management (close monitor of Blood Glucose) []  - 0 Ankle / Brachial Index (ABI) - do not check if billed separately X- 1 5 Vital Signs Jenna [name], Jenna [name] ([id number]) Has the patient been seen at the hospital within the last three years: Yes Total Score: 85 Level Of Care: New/Established - Level 3 Electronic Signature(s) Signed: 11/09/2019 5:21:25 PM By: Gretta Cool, [name], RN, CWS, [name], [name] Entered By: Gretta Cool, [name], RN, CWS, Kim on 11/09/2019 10:40:40 Jenna [name] ([id number]) -------------------------------------------------------------------------------- Encounter Discharge Information Details Patient Name: Jenna [name] Date of Service: 11/09/2019 10:45 AM Medical Record Number: [id number] Patient Account Number: [id number] Date of Birth/Sex: Sep 20, 1931 (85 y.o. F) Treating RN: Cornell Barman Primary Care Haygen Zebrowski: Lujean Amel Other Clinician: Referring Alizae Bechtel: Lujean Amel Treating Amylah Will/Extender: Tito Dine in Treatment: 1 Encounter Discharge Information Items Discharge Condition: Stable Ambulatory Status:  Cane Discharge Destination: Home Transportation: Private Auto Accompanied By: self Schedule Follow-up Appointment: Yes Clinical Summary of Care: Electronic Signature(s) Signed: 11/09/2019 5:21:25 PM By: Gretta Cool, [name], RN, CWS, [name], [name] Entered By: Gretta Cool, [name], RN, CWS, Kim on 11/09/2019 10:42:34 Jenna [name] ([id number]) -------------------------------------------------------------------------------- Lower Extremity Assessment Details Patient Name: Jenna [name] Date of Service: 11/09/2019 10:45 AM Medical Record Number: [id number] Patient Account Number: [id number] Date of Birth/Sex: 1931-12-18 (85 y.o. F) Treating RN: Army Melia Primary Care Keydi Giel: Lujean Amel Other Clinician: Referring Reginaldo Hazard: Lujean Amel Treating Navin Dogan/Extender: Ricard Dillon Weeks in Treatment: 1 Edema Assessment Assessed: [Left: No] [Right: No] Edema: [Left: N] [Right: o] Calf Left: Right: Point of Measurement: 32 cm From Medial Instep 44.5 cm cm Ankle Left: Right: Point of Measurement: 10 cm From Medial Instep 24.5 cm cm Vascular Assessment Pulses: Dorsalis Pedis Palpable: [Left:Yes] Electronic Signature(s) Signed: 11/10/2019 3:26:34 PM By: Army Melia Entered By: Army Melia on 11/09/2019 10:31:24 Jenna [name] ([id number]) -------------------------------------------------------------------------------- Multi Wound Chart Details Patient Name: Jenna [name] Date of Service: 11/09/2019 10:45 AM Medical Record Number: [id number] Patient Account Number: [id number] Date of Birth/Sex: [birth year] (85 y.o. F) Treating RN: Cornell Barman Primary Care Rafael Quesada: Lujean Amel Other Clinician: Referring Deren Degrazia: Lujean Amel Treating Truong Delcastillo/Extender: Tito Dine in Treatment: 1 Vital Signs Height(in): 61 Pulse(bpm): 84 Weight(lbs): 220 Blood Pressure(mmHg): 188/70 Body Mass Index(BMI): 42 Temperature(F): 98.1 Respiratory  Rate 16 (breaths/min): Photos: [N/A:N/A] Wound Location: Left Lower Leg - Medial N/A N/A Wounding Event: Gradually Appeared N/A N/A Primary Etiology: Diabetic Wound/Ulcer of the N/A N/A Lower Extremity Comorbid History: Cataracts, Glaucoma, N/A N/A Lymphedema, Hypertension, Type II Diabetes, Osteoarthritis Date Acquired: 08/30/2019 N/A N/A Weeks of Treatment: 1 N/A N/A Wound Status: Open N/A N/A Measurements L x W x D 2x3x0.1 N/A N/A (cm) Area (cm) : 4.712 N/A N/A Volume (cm) : 0.471 N/A N/A %  Reduction in Area: -76.50% N/A N/A % Reduction in Volume: -76.40% N/A N/A Classification: Grade 2 N/A N/A Exudate Amount: Small N/A N/A Exudate Type: Serous N/A N/A Exudate Color: amber N/A N/A Wound Margin: Flat and Intact N/A N/A Granulation Amount: Small (1-33%) N/A N/A Granulation Quality: Pale N/A N/A Necrotic Amount: Large (67-100%) N/A N/A Necrotic Tissue: Eschar, Adherent Slough N/A N/A Exposed Structures: Fat Layer (Subcutaneous N/A N/A Tissue) Exposed: Yes Fascia: No Jenna [name], Jenna [name] ([id number]) Tendon: No Muscle: No Joint: No Bone: No Epithelialization: None N/A N/A Treatment Notes Wound #3 (Left, Medial Lower Leg) Notes SIlver Alginate, gauze, Kerlix and Coban, unna anchor Electronic Signature(s) Signed: 11/09/2019 5:41:49 PM By: Linton Ham MD Entered By: Linton Ham on 11/09/2019 10:45:16 Jenna [name], Jenna [name] ([id number]) -------------------------------------------------------------------------------- [place] Details Patient Name: Jenna [name] Date of Service: 11/09/2019 10:45 AM Medical Record Number: [id number] Patient Account Number: [id number] Date of Birth/Sex: 1931-01-15 (85 y.o. F) Treating RN: Cornell Barman Primary Care Khristi Schiller: Lujean Amel Other Clinician: Referring English Craighead: Lujean Amel Treating Dashanique Brownstein/Extender: Tito Dine in Treatment: 1 Active Inactive Necrotic Tissue Nursing  Diagnoses: Impaired tissue integrity related to necrotic/devitalized tissue Goals: Necrotic/devitalized tissue will be minimized in the wound bed Date Initiated: 11/02/2019 Target Resolution Date: 11/09/2019 Goal Status: Active Interventions: Assess patient pain level pre-, during and post procedure and prior to discharge Treatment Activities: Apply topical anesthetic as ordered : 11/02/2019 Notes: Orientation to the Wound Care Program Nursing Diagnoses: Knowledge deficit related to the wound healing center program Goals: Patient/caregiver will verbalize understanding of the [place] Date Initiated: 11/02/2019 Target Resolution Date: 11/09/2019 Goal Status: Active Interventions: Provide education on orientation to the wound center Notes: Venous Leg Ulcer Nursing Diagnoses: Potential for venous Insuffiency (use before diagnosis confirmed) Goals: Non-invasive venous studies are completed as ordered Date Initiated: 11/02/2019 Target Resolution Date: 11/09/2019 Goal Status: Active Jenna [name], Jenna [name] ([id number]) Interventions: Assess peripheral edema status every visit. Provide education on venous insufficiency Treatment Activities: Therapeutic compression applied : 11/02/2019 Notes: Wound/Skin Impairment Nursing Diagnoses: Impaired tissue integrity Goals: Ulcer/skin breakdown will have a volume reduction of 30% by week 4 Date Initiated: 11/02/2019 Target Resolution Date: 11/30/2019 Goal Status: Active Interventions: Assess patient/caregiver ability to obtain necessary supplies Notes: Electronic Signature(s) Signed: 11/09/2019 5:21:25 PM By: Gretta Cool, [name], RN, CWS, [name], [name] Entered By: Gretta Cool, [name], RN, CWS, Kim on 11/09/2019 10:39:08 Jenna [name] ([id number]) -------------------------------------------------------------------------------- Pain Assessment Details Patient Name: Jenna [name] Date of Service: 11/09/2019 10:45 AM Medical Record  Number: [id number] Patient Account Number: [id number] Date of Birth/Sex: 12-14-1931 (85 y.o. F) Treating RN: Army Melia Primary Care Chenoa Luddy: Lujean Amel Other Clinician: Referring Macel Yearsley: Lujean Amel Treating Domnique Vantine/Extender: Tito Dine in Treatment: 1 Active Problems Location of Pain Severity and Description of Pain Patient Has Paino Yes Site Locations Pain Location: Pain in Ulcers Rate the pain. Current Pain Level: 8 Pain Management and Medication Current Pain Management: Electronic Signature(s) Signed: 11/10/2019 3:26:34 PM By: Army Melia Entered By: Army Melia on 11/09/2019 10:23:41 Jenna [name] ([id number]) -------------------------------------------------------------------------------- Patient/Caregiver Education Details Patient Name: Jenna [name] Date of Service: 11/09/2019 10:45 AM Medical Record Number: [id number] Patient Account Number: [id number] Date of Birth/Gender: Dec 20, 1931 (85 y.o. F) Treating RN: Cornell Barman Primary Care Physician: Lujean Amel Other Clinician: Referring Physician: Lujean Amel Treating Physician/Extender: Tito Dine in Treatment: 1 Education Assessment Education Provided To: Patient Education Topics Provided Tissue Oxygenation: Venous: Handouts: Controlling Swelling with Multilayered Compression Wraps Methods: Demonstration, Explain/Verbal Responses:

## 2019-11-14 ENCOUNTER — Other Ambulatory Visit: Payer: Self-pay

## 2019-11-14 ENCOUNTER — Ambulatory Visit (INDEPENDENT_AMBULATORY_CARE_PROVIDER_SITE_OTHER): Payer: Medicare Other

## 2019-11-14 DIAGNOSIS — S81802S Unspecified open wound, left lower leg, sequela: Secondary | ICD-10-CM | POA: Diagnosis not present

## 2019-11-15 ENCOUNTER — Encounter (INDEPENDENT_AMBULATORY_CARE_PROVIDER_SITE_OTHER): Payer: Self-pay

## 2019-11-15 DIAGNOSIS — H43813 Vitreous degeneration, bilateral: Secondary | ICD-10-CM | POA: Diagnosis not present

## 2019-11-15 DIAGNOSIS — E119 Type 2 diabetes mellitus without complications: Secondary | ICD-10-CM | POA: Diagnosis not present

## 2019-11-15 DIAGNOSIS — H25811 Combined forms of age-related cataract, right eye: Secondary | ICD-10-CM | POA: Diagnosis not present

## 2019-11-15 DIAGNOSIS — H401132 Primary open-angle glaucoma, bilateral, moderate stage: Secondary | ICD-10-CM | POA: Diagnosis not present

## 2019-11-16 ENCOUNTER — Encounter: Payer: Medicare Other | Admitting: Internal Medicine

## 2019-11-16 ENCOUNTER — Other Ambulatory Visit: Payer: Self-pay

## 2019-11-16 DIAGNOSIS — L97322 Non-pressure chronic ulcer of left ankle with fat layer exposed: Secondary | ICD-10-CM | POA: Diagnosis not present

## 2019-11-16 DIAGNOSIS — E785 Hyperlipidemia, unspecified: Secondary | ICD-10-CM | POA: Diagnosis not present

## 2019-11-16 DIAGNOSIS — M1990 Unspecified osteoarthritis, unspecified site: Secondary | ICD-10-CM | POA: Diagnosis not present

## 2019-11-16 DIAGNOSIS — E119 Type 2 diabetes mellitus without complications: Secondary | ICD-10-CM | POA: Diagnosis not present

## 2019-11-16 DIAGNOSIS — I10 Essential (primary) hypertension: Secondary | ICD-10-CM | POA: Diagnosis not present

## 2019-11-16 DIAGNOSIS — I87312 Chronic venous hypertension (idiopathic) with ulcer of left lower extremity: Secondary | ICD-10-CM | POA: Diagnosis not present

## 2019-11-16 DIAGNOSIS — I87333 Chronic venous hypertension (idiopathic) with ulcer and inflammation of bilateral lower extremity: Secondary | ICD-10-CM | POA: Diagnosis not present

## 2019-11-16 DIAGNOSIS — L97221 Non-pressure chronic ulcer of left calf limited to breakdown of skin: Secondary | ICD-10-CM | POA: Diagnosis not present

## 2019-11-22 ENCOUNTER — Encounter: Payer: Medicare Other | Admitting: Physician Assistant

## 2019-11-22 ENCOUNTER — Other Ambulatory Visit: Payer: Self-pay

## 2019-11-22 DIAGNOSIS — I87312 Chronic venous hypertension (idiopathic) with ulcer of left lower extremity: Secondary | ICD-10-CM | POA: Diagnosis not present

## 2019-11-22 DIAGNOSIS — E785 Hyperlipidemia, unspecified: Secondary | ICD-10-CM | POA: Diagnosis not present

## 2019-11-22 DIAGNOSIS — M1990 Unspecified osteoarthritis, unspecified site: Secondary | ICD-10-CM | POA: Diagnosis not present

## 2019-11-22 DIAGNOSIS — I87333 Chronic venous hypertension (idiopathic) with ulcer and inflammation of bilateral lower extremity: Secondary | ICD-10-CM | POA: Diagnosis not present

## 2019-11-22 DIAGNOSIS — E119 Type 2 diabetes mellitus without complications: Secondary | ICD-10-CM | POA: Diagnosis not present

## 2019-11-22 DIAGNOSIS — L97221 Non-pressure chronic ulcer of left calf limited to breakdown of skin: Secondary | ICD-10-CM | POA: Diagnosis not present

## 2019-11-22 DIAGNOSIS — I10 Essential (primary) hypertension: Secondary | ICD-10-CM | POA: Diagnosis not present

## 2019-11-22 DIAGNOSIS — L97222 Non-pressure chronic ulcer of left calf with fat layer exposed: Secondary | ICD-10-CM | POA: Diagnosis not present

## 2019-11-23 NOTE — Progress Notes (Signed)
Jenna [name], Jenna [name] ([id number]) Visit Report for 11/22/2019 Chief Complaint Document Details Patient Name: Jenna [name]. Date of Service: 11/22/2019 3:15 PM Medical Record Number: [id number] Patient Account Number: [id number] Date of Birth/Sex: November 18, 1931 (85 y.o. F) Treating RN: Montey Hora Primary Care Provider: Lujean Amel Other Clinician: Referring Provider: Lujean Amel Treating Provider/Extender: Melburn Hake, HOYT Weeks in Treatment: 2 Information Obtained from: Patient Chief Complaint Patient presents for treatment of an open ulcer due to venous insufficiency the left lower extremity she's had for several months now 05/26/18; patient presents for opening and drainage of the left medial ankle which is the same place where she had a wound previously in this clinic. 11/02/2019; patient is here for review of wound on her left medial lower leg Electronic Signature(s) Signed: 11/22/2019 6:12:02 PM By: Worthy Keeler PA-C Entered By: Worthy Keeler on 11/22/2019 15:37:36 [name], [name] ([id number]) -------------------------------------------------------------------------------- HPI Details Patient Name: Jenna [name] Date of Service: 11/22/2019 3:15 PM Medical Record Number: [id number] Patient Account Number: [id number] Date of Birth/Sex: [birth year] (85 y.o. F) Treating RN: Montey Hora Primary Care Provider: Lujean Amel Other Clinician: Referring Provider: Lujean Amel Treating Provider/Extender: Melburn Hake, HOYT Weeks in Treatment: 2 History of Present Illness Location: left lower extremity in the medial ankle region Quality: Patient reports experiencing a dull pain to affected area(s). Severity: Patient states wound are getting worse. Duration: Patient has had the wound for > 3 months prior to seeking treatment at the wound center Timing: Pain in wound is Intermittent (comes and goes Context: The wound appeared gradually over time Modifying Factors:  Other treatment(s) tried include:[name] Associated Signs and Symptoms: Patient reports having increase swelling. HPI Description: 85 year old patient with a past medical history significant for venous stasis ulceration and diabetes mellitus was recently admitted to the hospital between 07/07/2016 and 07/09/2016. She was wound to have a nonpurulent cellulitis of the left lower extremity and was started on IV antibiotics which included vancomycin. He is discharged home with her and Unna's boots and oral doxycycline. During this admission there was no obvious DVT involving the left lower extremity. her past medical history significant for diabetes mellitus, hypertension, hyperlipidemia, varicose veins, status post right rotator cuff repair, knee surgery on the left,robotic abdominal hysterectomy, laparoscopic cholecystectomy. She is not a smoker. Venous study done on 07/18/2015 showed normal left extremity deep venous system with no evidence of DVT. There was extensive valvular incompetence and reflux throughout the left greater saphenous vein with direct the medication to subcutaneous varicose veins. Normal left small saphenous vein. I understand she was seen by vascular radiology group and the workup and recommendation was for endovenous ablation but due to family pressure she was not able to keep her appointment a year ago. She has not been wearing her compression stockings regularly. 05/26/18 READMISSION this is an 85 year old woman who was previously seen in this clinic in 2017 by Dr. Con Memos. She has chronic venous insufficiency with lymphedema and at that time had open area on the left medial calf and ankle area. Was recommended that she wear 20- 30 mm compression stockings and the patient tells me that she has been compliant with this although I think her primary doctor recently told her that she didn't need to wear stockings out of fear it  might cause arterial compression. She noticed edema and pain in the area a week to 2 ago. She saw her primary doctor and was given doxycycline and Silvadene cream to  put on the area. She states things are a lot better. The patient has a history of type 2 diabetes on oral agents but she is unaware of her hemoglobin A1c for her blood glucose which she doesn't check. She has hypertension, varicose veins, rotator cuff repair, knee surgery on the left, history of a laparoscopic cholecystectomy, lymphedema, obstructive sleep apnea, history of endometrial CA. The patient has been to see vein and vascular in the past and I think has had an ablation of the left greater saphenous vein based on ultrasounds of the left leg IC dating back to 2017. Her most recent ultrasound was in May 2018 which showed no evidence of a DVT and continued durable closure of the treated segment of the left greater saphenous vein. Patent lateral accessory branch of the greater saphenous vein extending to the calf ABIs in our clinic were 1.05 on the right and 0.95 on the left 06/02/18 the patient's wound on the left medial lower calf is completely closed and epithelialized. She has new 20-30 mm below- knee stockings READMISSION 11/02/2019 Jenna [name], Jenna [name] ([id number]) This is a now 85 year old woman who has been in this clinic 2 times before. Most recently in 2019. She has chronic venous insufficiency with some degree of secondary lymphedema. She has had a history of a left greater saphenous vein ablation. When she is here in 2019 she had a wound on the left medial lower calf. This closed fairly easily. It was recommended she wear 20/30 mm below-knee stockings she is not compliant with this. She arrives in clinic with a 2-week history of a left medial lower extremity and ankle wound. Some of this has dry slough on it but most of it is already epithelialize she has been using a combination of Vaseline and/or Silvadene. She is not  wearing any compression. She has a history of chronic venous reflux. There was recommendations in the past for ablations I do not know that she ever carried through with this. According to notes from 2016 this work-up was done via the interventional radiology group. We will need to research this. She is probably going to need a consultation ABIs in our clinic were 1.03 on the right and 0.93 on the left 11/11; patient's wound looks as though it is epithelializing horizontal wound on the left medial lower extremity. She has a superior satellite lesion. She is complaining of a lot of pain in 3 layer compression. There have been recommendations for previous ablations I am not sure if she followed up on this. She has been noncompliant with stockings although she brought those into the clinic today. 11/18; the patient had a repeat venous reflux studies at vein and vascular. This did not show any DVT in the left lower extremity there was no evidence of chronic venous insufficiency and no evidence of superficial vein thrombosis. I looked back at previous studies done I think in interventional radiology in 2018 would suggest that there had been previous ablation of a segment of the left greater saphenous vein. By review of the new study I do not think the patient needs to see vein and vascular however I find these studies increasingly difficult to interpret. I am not sure that the patient has actually consistently worn compression stockings and I think that is the next step here. The wound is just about closed 11/22/2019 upon evaluation today patient actually appears to be healed based on what I am seeing today. She has been tolerating the dressing changes without complication. Fortunately  there is no signs of active infection at this time. Overall I feel like even though this is the first time of seeing her that she has actually done extremely well with the current wound care measures again she has been  under the care of Dr. Dellia Nims. Electronic Signature(s) Signed: 11/22/2019 6:12:02 PM By: Worthy Keeler PA-C Entered By: Worthy Keeler on 11/22/2019 17:47:11 Cragg, [name] ([id number]) -------------------------------------------------------------------------------- Physical Exam Details Patient Name: Jenna [name] Date of Service: 11/22/2019 3:15 PM Medical Record Number: [id number] Patient Account Number: [id number] Date of Birth/Sex: 07-Nov-1931 (85 y.o. F) Treating RN: Montey Hora Primary Care Provider: Lujean Amel Other Clinician: Referring Provider: Lujean Amel Treating Provider/Extender: STONE III, HOYT Weeks in Treatment: 2 Constitutional Well-nourished and well-hydrated in no acute distress. Respiratory normal breathing without difficulty. clear to auscultation bilaterally. Cardiovascular regular rate and rhythm with normal S1, S2. Psychiatric this patient is able to make decisions and demonstrates good insight into disease process. Alert and Oriented x 3. pleasant and cooperative. Notes Patient's wound bed currently showed signs of really being healed in my opinion there was one area where there was a question as to whether or not this could have a small opening but there was nothing actively draining. I think it is an area of concern however we need to keep a close eye on. She really wants to transition into her own compression stockings at this point which I am okay with doing although I would recommend making a follow-up appointment in 1 week's time just to ensure that everything seems to heal appropriately and that she does not have any reopening. The patient is in agreement with that plan. Electronic Signature(s) Signed: 11/22/2019 6:12:02 PM By: Worthy Keeler PA-C Entered By: Worthy Keeler on 11/22/2019 17:47:45 [place], [name] ([id number]) -------------------------------------------------------------------------------- Physician Orders  Details Patient Name: Jenna [name] Date of Service: 11/22/2019 3:15 PM Medical Record Number: [id number] Patient Account Number: [id number] Date of Birth/Sex: 1931-06-14 (85 y.o. F) Treating RN: Montey Hora Primary Care Provider: Lujean Amel Other Clinician: Referring Provider: Lujean Amel Treating Provider/Extender: Melburn Hake, HOYT Weeks in Treatment: 2 Verbal / Phone Orders: No Diagnosis Coding ICD-10 Coding Code Description L97.221 Non-pressure chronic ulcer of left calf limited to breakdown of skin I87.333 Chronic venous hypertension (idiopathic) with ulcer and inflammation of bilateral lower extremity Follow-up Appointments o Return Appointment in 1 week. Edema Control o Patient to wear own compression stockings Electronic Signature(s) Signed: 11/22/2019 5:06:49 PM By: Montey Hora Signed: 11/22/2019 6:12:02 PM By: Worthy Keeler PA-C Entered By: Montey Hora on 11/22/2019 16:00:10 Jenna [name], Jenna [name] ([id number]) -------------------------------------------------------------------------------- Problem List Details Patient Name: Jenna [name], Jenna [name] Date of Service: 11/22/2019 3:15 PM Medical Record Number: [id number] Patient Account Number: [id number] Date of Birth/Sex: 1931/04/21 (85 y.o. F) Treating RN: Montey Hora Primary Care Provider: Lujean Amel Other Clinician: Referring Provider: Lujean Amel Treating Provider/Extender: Melburn Hake, HOYT Weeks in Treatment: 2 Active Problems ICD-10 Evaluated Encounter Code Description Active Date Today Diagnosis L97.221 Non-pressure chronic ulcer of left calf limited to breakdown of 11/02/2019 No Yes skin I87.333 Chronic venous hypertension (idiopathic) with ulcer and 11/16/2019 No Yes inflammation of bilateral lower extremity Inactive Problems Resolved Problems Electronic Signature(s) Signed: 11/22/2019 6:12:02 PM By: Worthy Keeler PA-C Entered By: Worthy Keeler on 11/22/2019 15:37:30 Koenen,  [name] ([id number]) -------------------------------------------------------------------------------- Progress Note Details Patient Name: Jenna [name] Date of Service: 11/22/2019 3:15 PM Medical Record Number: [id number] Patient Account Number: [id number] Date of Birth/Sex: Apr 05, 1931 (85 y.o.  F) Treating RN: Montey Hora Primary Care Provider: Lujean Amel Other Clinician: Referring Provider: Lujean Amel Treating Provider/Extender: Melburn Hake, HOYT Weeks in Treatment: 2 Subjective Chief Complaint Information obtained from Patient Patient presents for treatment of an open ulcer due to venous insufficiency the left lower extremity she's had for several months now 05/26/18; patient presents for opening and drainage of the left medial ankle which is the same place where she had a wound previously in this clinic. 11/02/2019; patient is here for review of wound on her left medial lower leg History of Present Illness (HPI) The following HPI elements were documented for the patient's wound: Location: left lower extremity in the medial ankle region Quality: Patient reports experiencing a dull pain to affected area(s). Severity: Patient states wound are getting worse. Duration: Patient has had the wound for > 3 months prior to seeking treatment at the wound center Timing: Pain in wound is Intermittent (comes and goes Context: The wound appeared gradually over time Modifying Factors: Other treatment(s) tried include:[name] Associated Signs and Symptoms: Patient reports having increase swelling. 85 year old patient with a past medical history significant for venous stasis ulceration and diabetes mellitus was recently admitted to the hospital between 07/07/2016 and 07/09/2016. She was wound to have a nonpurulent cellulitis of the left lower extremity and was started on IV antibiotics which included vancomycin. He is discharged home  with her and Unna's boots and oral doxycycline. During this admission there was no obvious DVT involving the left lower extremity. her past medical history significant for diabetes mellitus, hypertension, hyperlipidemia, varicose veins, status post right rotator cuff repair, knee surgery on the left,robotic abdominal hysterectomy, laparoscopic cholecystectomy. She is not a smoker. Venous study done on 07/18/2015 showed normal left extremity deep venous system with no evidence of DVT. There was extensive valvular incompetence and reflux throughout the left greater saphenous vein with direct the medication to subcutaneous varicose veins. Normal left small saphenous vein. I understand she was seen by vascular radiology group and the workup and recommendation was for endovenous ablation but due to family pressure she was not able to keep her appointment a year ago. She has not been wearing her compression stockings regularly. 05/26/18 READMISSION this is an 85 year old woman who was previously seen in this clinic in 2017 by Dr. Con Memos. She has chronic venous insufficiency with lymphedema and at that time had open area on the left medial calf and ankle area. Was recommended that she wear 20- 30 mm compression stockings and the patient tells me that she has been compliant with this although I think her primary doctor recently told her that she didn't need to wear stockings out of fear it might cause arterial compression. She noticed edema and pain in the area a week to 2 ago. She saw her primary doctor and was given doxycycline and Silvadene cream to put on the area. She states things are a lot better. The patient has a history of type 2 diabetes on oral agents but she is unaware of her hemoglobin A1c for her blood glucose which she doesn't check. She has hypertension, varicose veins, rotator cuff repair, knee surgery on the left, history of a laparoscopic cholecystectomy, lymphedema, obstructive sleep  apnea, history of endometrial CA. The patient has been to see vein and vascular in the past and I think has had an ablation of the left greater saphenous vein based on ultrasounds of the left leg IC dating back to 2017. Her  most recent ultrasound was in May 2018 which showed no Jenna [name], Jenna [name]. ([id number]) evidence of a DVT and continued durable closure of the treated segment of the left greater saphenous vein. Patent lateral accessory branch of the greater saphenous vein extending to the calf ABIs in our clinic were 1.05 on the right and 0.95 on the left 06/02/18 the patient's wound on the left medial lower calf is completely closed and epithelialized. She has new 20-30 mm below- knee stockings READMISSION 11/02/2019 This is a now 85 year old woman who has been in this clinic 2 times before. Most recently in 2019. She has chronic venous insufficiency with some degree of secondary lymphedema. She has had a history of a left greater saphenous vein ablation. When she is here in 2019 she had a wound on the left medial lower calf. This closed fairly easily. It was recommended she wear 20/30 mm below-knee stockings she is not compliant with this. She arrives in clinic with a 2-week history of a left medial lower extremity and ankle wound. Some of this has dry slough on it but most of it is already epithelialize she has been using a combination of Vaseline and/or Silvadene. She is not wearing any compression. She has a history of chronic venous reflux. There was recommendations in the past for ablations I do not know that she ever carried through with this. According to notes from 2016 this work-up was done via the interventional radiology group. We will need to research this. She is probably going to need a consultation ABIs in our clinic were 1.03 on the right and 0.93 on the left 11/11; patient's wound looks as though it is epithelializing horizontal wound on the left medial lower extremity. She  has a superior satellite lesion. She is complaining of a lot of pain in 3 layer compression. There have been recommendations for previous ablations I am not sure if she followed up on this. She has been noncompliant with stockings although she brought those into the clinic today. 11/18; the patient had a repeat venous reflux studies at vein and vascular. This did not show any DVT in the left lower extremity there was no evidence of chronic venous insufficiency and no evidence of superficial vein thrombosis. I looked back at previous studies done I think in interventional radiology in 2018 would suggest that there had been previous ablation of a segment of the left greater saphenous vein. By review of the new study I do not think the patient needs to see vein and vascular however I find these studies increasingly difficult to interpret. I am not sure that the patient has actually consistently worn compression stockings and I think that is the next step here. The wound is just about closed 11/22/2019 upon evaluation today patient actually appears to be healed based on what I am seeing today. She has been tolerating the dressing changes without complication. Fortunately there is no signs of active infection at this time. Overall I feel like even though this is the first time of seeing her that she has actually done extremely well with the current wound care measures again she has been under the care of Dr. Dellia Nims. Patient History Information obtained from Patient. Family History Cancer - Siblings, Diabetes - Siblings, Hypertension - Maternal Grandparents,Paternal Grandparents,Mother,Father,Siblings, Lung Disease - Siblings, No family history of Heart Disease, Hereditary Spherocytosis, Kidney Disease, Seizures, Stroke, Thyroid Problems, Tuberculosis. Social History Never smoker, Marital Status - Married, Alcohol Use - Never, Drug Use - No History, Caffeine  Use - Daily. Medical  History Eyes Patient has history of Cataracts - surgery, Glaucoma Ear/Nose/Mouth/Throat Denies history of Chronic sinus problems/congestion, Middle ear problems Hematologic/Lymphatic Patient has history of Lymphedema ADALIND, GULAS ([id number]) Denies history of Anemia, Hemophilia, Human Immunodeficiency Virus, Sickle Cell Disease Respiratory Denies history of Aspiration, Asthma, Chronic Obstructive Pulmonary Disease (COPD), Pneumothorax, Sleep Apnea, Tuberculosis Cardiovascular Patient has history of Hypertension Denies history of Angina, Arrhythmia, Congestive Heart Failure, Coronary Artery Disease, Deep Vein Thrombosis, Hypotension, Myocardial Infarction, Peripheral Arterial Disease, Peripheral Venous Disease, Phlebitis, Vasculitis Gastrointestinal Denies history of Cirrhosis , Colitis, Crohn s, Hepatitis A, Hepatitis B, Hepatitis C Endocrine Patient has history of Type II Diabetes Genitourinary Denies history of End Stage Renal Disease Immunological Denies history of Lupus Erythematosus, Raynaud s, Scleroderma Integumentary (Skin) Denies history of History of Burn, History of pressure wounds Musculoskeletal Patient has history of Osteoarthritis Denies history of Gout, Rheumatoid Arthritis, Osteomyelitis Neurologic Denies history of Dementia, Neuropathy, Quadriplegia, Paraplegia, Seizure Disorder Oncologic Denies history of Received Chemotherapy, Received Radiation Psychiatric Denies history of Anorexia/bulimia, Confinement Anxiety Review of Systems (ROS) Constitutional Symptoms (General Health) Denies complaints or symptoms of Fatigue, Fever, Chills, Marked Weight Change. Respiratory Denies complaints or symptoms of Chronic or frequent coughs, Shortness of Breath. Cardiovascular Complains or has symptoms of LE edema. Denies complaints or symptoms of Chest pain. Psychiatric Denies complaints or symptoms of Anxiety,  Claustrophobia. Objective Constitutional Well-nourished and well-hydrated in no acute distress. Vitals Time Taken: 3:21 PM, Height: 61 in, Weight: 220 lbs, BMI: 41.6, Temperature: 98.9 F, Pulse: 81 bpm, Respiratory Rate: 16 breaths/min, Blood Pressure: 129/75 mmHg. Respiratory normal breathing without difficulty. clear to auscultation bilaterally. Jenna [name], Jenna [name] ([id number]) Cardiovascular regular rate and rhythm with normal S1, S2. Psychiatric this patient is able to make decisions and demonstrates good insight into disease process. Alert and Oriented x 3. pleasant and cooperative. General Notes: Patient's wound bed currently showed signs of really being healed in my opinion there was one area where there was a question as to whether or not this could have a small opening but there was nothing actively draining. I think it is an area of concern however we need to keep a close eye on. She really wants to transition into her own compression stockings at this point which I am okay with doing although I would recommend making a follow-up appointment in 1 week's time just to ensure that everything seems to heal appropriately and that she does not have any reopening. The patient is in agreement with that plan. Integumentary (Hair, Skin) Wound #3 status is Open. Original cause of wound was Gradually Appeared. The wound is located on the Left,Medial Lower Leg. The wound measures 0.1cm length x 0.1cm width x 0.1cm depth; 0.008cm^2 area and 0.001cm^3 volume. There is Fat Layer (Subcutaneous Tissue) Exposed exposed. There is no tunneling or undermining noted. There is a small amount of serous drainage noted. The wound margin is flat and intact. There is small (1-33%) pale granulation within the wound bed. There is a large (67-100%) amount of necrotic tissue within the wound bed including Eschar. Assessment Active Problems ICD-10 Non-pressure chronic ulcer of left calf limited to breakdown of  skin Chronic venous hypertension (idiopathic) with ulcer and inflammation of bilateral lower extremity Plan Follow-up Appointments: Return Appointment in 1 week. Edema Control: Patient to wear own compression stockings 1. I would recommend that we make a 1 week follow-up visit although if she is doing better and has no drainage between now and then week  she will call we will cancel that appointment. 2. I would also recommend that we go ahead and initiate treatment with a home compression stockings she does have those with her today we will put them on and proceed from there. 3. I still recommend that she needs to elevate her legs as much as possible when she is in a seated position this will help to prevent any breakdown ongoing. We will see patient back for reevaluation in 1 week here in the clinic. If anything worsens or changes patient will contact our office for additional recommendations. Jenna [name], Jenna [name] ([id number]) Electronic Signature(s) Signed: 11/22/2019 6:12:02 PM By: Worthy Keeler PA-C Entered By: Worthy Keeler on 11/22/2019 17:48:22 Santiana, Abril [name] ([id number]) -------------------------------------------------------------------------------- ROS/PFSH Details Patient Name: Jenna [name] Date of Service: 11/22/2019 3:15 PM Medical Record Number: [id number] Patient Account Number: [id number] Date of Birth/Sex: 1931/01/28 (85 y.o. F) Treating RN: Montey Hora Primary Care Provider: Lujean Amel Other Clinician: Referring Provider: Lujean Amel Treating Provider/Extender: STONE III, HOYT Weeks in Treatment: 2 Information Obtained From Patient Constitutional Symptoms (General Health) Complaints and Symptoms: Negative for: Fatigue; Fever; Chills; Marked Weight Change Respiratory Complaints and Symptoms: Negative for: Chronic or frequent coughs; Shortness of Breath Medical History: Negative for: Aspiration; Asthma; Chronic Obstructive Pulmonary Disease  (COPD); Pneumothorax; Sleep Apnea; Tuberculosis Cardiovascular Complaints and Symptoms: Positive for: LE edema Negative for: Chest pain Medical History: Positive for: Hypertension Negative for: Angina; Arrhythmia; Congestive Heart Failure; Coronary Artery Disease; Deep Vein Thrombosis; Hypotension; Myocardial Infarction; Peripheral Arterial Disease; Peripheral Venous Disease; Phlebitis; Vasculitis Psychiatric Complaints and Symptoms: Negative for: Anxiety; Claustrophobia Medical History: Negative for: Anorexia/bulimia; Confinement Anxiety Eyes Medical History: Positive for: Cataracts - surgery; Glaucoma Ear/Nose/Mouth/Throat Medical History: Negative for: Chronic sinus problems/congestion; Middle ear problems Hematologic/Lymphatic Medical History: Positive for: Lymphedema Jenna [name], Jenna [name] ([id number]) Negative for: Anemia; Hemophilia; Human Immunodeficiency Virus; Sickle Cell Disease Gastrointestinal Medical History: Negative for: Cirrhosis ; Colitis; Crohnos; Hepatitis A; Hepatitis B; Hepatitis C Endocrine Medical History: Positive for: Type II Diabetes Time with diabetes: 15 years Treated with: Oral agents Blood sugar tested every day: Yes Tested : rarely Genitourinary Medical History: Negative for: End Stage Renal Disease Immunological Medical History: Negative for: Lupus Erythematosus; Raynaudos; Scleroderma Integumentary (Skin) Medical History: Negative for: History of Burn; History of pressure wounds Musculoskeletal Medical History: Positive for: Osteoarthritis Negative for: Gout; Rheumatoid Arthritis; Osteomyelitis Neurologic Medical History: Negative for: Dementia; Neuropathy; Quadriplegia; Paraplegia; Seizure Disorder Oncologic Medical History: Negative for: Received Chemotherapy; Received Radiation HBO Extended History Items Eyes: Eyes: Cataracts Glaucoma Immunizations Pneumococcal Vaccine: Received Pneumococcal Vaccination: No Implantable  Devices None Family and Social History Jenna [name], Jenna [name] ([id number]) Cancer: Yes - Siblings; Diabetes: Yes - Siblings; Heart Disease: No; Hereditary Spherocytosis: No; Hypertension: Yes - Maternal Grandparents,Paternal Grandparents,Mother,Father,Siblings; Kidney Disease: No; Lung Disease: Yes - Siblings; Seizures: No; Stroke: No; Thyroid Problems: No; Tuberculosis: No; Never smoker; Marital Status - Married; Alcohol Use: Never; Drug Use: No History; Caffeine Use: Daily; Financial Concerns: No; Food, Clothing or Shelter Needs: No; Support System Lacking: No; Transportation Concerns: No Physician Affirmation I have reviewed and agree with the above information. Electronic Signature(s) Signed: 11/22/2019 6:12:02 PM By: Worthy Keeler PA-C Signed: 11/23/2019 4:08:09 PM By: Montey Hora Entered By: Worthy Keeler on 11/22/2019 17:47:29 Karr, [name] ([id number]) -------------------------------------------------------------------------------- SuperBill Details Patient Name: Jenna [name] Date of Service: 11/22/2019 Medical Record Number: [id number] Patient Account Number: [id number] Date of Birth/Sex: 1931-07-27 (85 y.o. 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 Chronic venous hypertension (idiopathic) with ulcer and inflammation of bilateral lower extremity Facility Procedures CPT4 Code: [id number] Description: 99213 - WOUND CARE VISIT-LEV 3 EST PT Modifier: Quantity: 1 Physician Procedures CPT4: Description Modifier Quantity Code [id number] - WC PHYS LEVEL 4 - EST PT 1 ICD-10 Diagnosis Description L97.221 Non-pressure chronic ulcer of left calf limited to breakdown of skin I87.333 Chronic venous  hypertension (idiopathic) with ulcer and  inflammation of bilateral lower extremity Electronic Signature(s) Signed: 11/22/2019 6:12:02 PM By: Worthy Keeler PA-C Entered By: Worthy Keeler on 11/22/2019 17:48:31

## 2019-11-23 NOTE — Progress Notes (Signed)
MLYNN, WEINKAUF ([id number]) Visit Report for 11/22/2019 Arrival Information Details Patient Name: Jenna [name], Jenna [name]. Date of Service: 11/22/2019 3:15 PM Medical Record Number: [id number] Patient Account Number: [id number] Date of Birth/Sex: [birth year] (85 y.o. F) Treating RN: Army Melia Primary Care Eve Rey: Lujean Amel Other Clinician: Referring Jimeka Balan: Lujean Amel Treating Gust Eugene/Extender: Melburn Hake, HOYT Weeks in Treatment: 2 Visit Information History Since Last Visit Added or deleted any medications: No Patient Arrived: Cane Any new allergies or adverse reactions: No Arrival Time: 15:21 Had a fall or experienced change in No Accompanied By: self activities of daily living that may affect Transfer Assistance: None risk of falls: Patient Identification Verified: Yes Signs or symptoms of abuse/neglect since last visito No Hospitalized since last visit: No Has Dressing in Place as Prescribed: Yes Pain Present Now: No Electronic Signature(s) Signed: 11/23/2019 4:07:14 PM By: Army Melia Entered By: Army Melia on 11/22/2019 15:21:19 Jenna [name] ([id number]) -------------------------------------------------------------------------------- Clinic Level of Care Assessment Details Patient Name: Jenna [name] Date of Service: 11/22/2019 3:15 PM Medical Record Number: [id number] Patient Account Number: [id number] Date of Birth/Sex: 1931-12-12 (85 y.o. F) Treating RN: Montey Hora Primary Care Haile Bosler: Lujean Amel Other Clinician: Referring Tsuruko Murtha: Lujean Amel Treating Chimaobi Casebolt/Extender: Melburn Hake, HOYT Weeks in Treatment: 2 Clinic Level of Care Assessment Items TOOL 4 Quantity Score []  - Use when only an EandM is performed on FOLLOW-UP visit 0 ASSESSMENTS - Nursing Assessment / Reassessment X - Reassessment of Co-morbidities (includes updates in patient status) 1 10 X- 1 5 Reassessment of Adherence to Treatment Plan ASSESSMENTS - Wound and  Skin Assessment / Reassessment X - Simple Wound Assessment / Reassessment - one wound 1 5 []  - 0 Complex Wound Assessment / Reassessment - multiple wounds []  - 0 Dermatologic / Skin Assessment (not related to wound area) ASSESSMENTS - Focused Assessment []  - Circumferential Edema Measurements - multi extremities 0 []  - 0 Nutritional Assessment / Counseling / Intervention X- 1 5 Lower Extremity Assessment (monofilament, tuning fork, pulses) []  - 0 Peripheral Arterial Disease Assessment (using hand held doppler) ASSESSMENTS - Ostomy and/or Continence Assessment and Care []  - Incontinence Assessment and Management 0 []  - 0 Ostomy Care Assessment and Management (repouching, etc.) PROCESS - Coordination of Care X - Simple Patient / Family Education for ongoing care 1 15 []  - 0 Complex (extensive) Patient / Family Education for ongoing care X- 1 10 Staff obtains Programmer, systems, Records, Test Results / Process Orders []  - 0 Staff telephones HHA, Nursing Homes / Clarify orders / etc []  - 0 Routine Transfer to another Facility (non-emergent condition) []  - 0 Routine Hospital Admission (non-emergent condition) []  - 0 New Admissions / Biomedical engineer / Ordering NPWT, Apligraf, etc. []  - 0 Emergency Hospital Admission (emergent condition) X- 1 10 Simple Discharge Coordination Jenna [name], Jenna [name] ([id number]) []  - 0 Complex (extensive) Discharge Coordination PROCESS - Special Needs []  - Pediatric / Minor Patient Management 0 []  - 0 Isolation Patient Management []  - 0 Hearing / Language / Visual special needs []  - 0 Assessment of Community assistance (transportation, D/C planning, etc.) []  - 0 Additional assistance / Altered mentation []  - 0 Support Surface(s) Assessment (bed, cushion, seat, etc.) INTERVENTIONS - Wound Cleansing / Measurement X - Simple Wound Cleansing - one wound 1 5 []  - 0 Complex Wound Cleansing - multiple wounds X- 1 5 Wound Imaging (photographs - any  number of wounds) []  - 0 Wound Tracing (instead of photographs) X- 1 5 Simple Wound Measurement - one wound []  -  Care Ibrohim Simmers: Lujean Amel Other Clinician: Referring Tayona Sarnowski: Lujean Amel Treating Oluwatobi Visser/Extender: STONE III, HOYT Weeks in Treatment: 2 Wound Status Wound Number: 3 Primary Diabetic Wound/Ulcer of the Lower Extremity Etiology: Wound Location: Left Lower Leg - Medial Wound Open Wounding Event: Gradually Appeared Status: Date Acquired: 08/30/2019 Comorbid Cataracts, Glaucoma,  Lymphedema, Weeks Of Treatment: 2 History: Hypertension, Type II Diabetes, Osteoarthritis Clustered Wound: No Photos Wound Measurements Length: (cm) 0.1 % Reduction in Width: (cm) 0.1 % Reduction in Depth: (cm) 0.1 Epithelializat Area: (cm) 0.008 Tunneling: Volume: (cm) 0.001 Undermining: Area: 99.7% Volume: 99.6% ion: Large (67-100%) No No Wound Description Classification: Grade 2 Foul Odor Afte Wound Margin: Flat and Intact Slough/Fibrino Exudate Amount: Small Exudate Type: Serous Exudate Color: amber r Cleansing: No Yes Wound Bed Granulation Amount: Small (1-33%) Exposed Structure Granulation Quality: Pale Fascia Exposed: No Necrotic Amount: Large (67-100%) Fat Layer (Subcutaneous Tissue) Exposed: Yes Necrotic Quality: Eschar Tendon Exposed: No Muscle Exposed: No Joint Exposed: No Bone Exposed: No Treatment Notes Jenna [name], Jenna [name] ([id number]) Wound #3 (Left, Medial Lower Leg) Notes patient's compression only Electronic Signature(s) Signed: 11/23/2019 4:07:14 PM By: Army Melia Entered By: Army Melia on 11/22/2019 15:22:57 Jenna [name] ([id number]) -------------------------------------------------------------------------------- Vitals Details Patient Name: Jenna [name] Date of Service: 11/22/2019 3:15 PM Medical Record Number: [id number] Patient Account Number: [id number] Date of Birth/Sex: November 25, 1931 (85 y.o. F) Treating RN: Army Melia Primary Care Ekam Besson: Lujean Amel Other Clinician: Referring Krystyna Cleckley: Lujean Amel Treating Beatrice Sehgal/Extender: STONE III, HOYT Weeks in Treatment: 2 Vital Signs Time Taken: 15:21 Temperature (F): 98.9 Height (in): 61 Pulse (bpm): 81 Weight (lbs): 220 Respiratory Rate (breaths/min): 16 Body Mass Index (BMI): 41.6 Blood Pressure (mmHg): 129/75 Reference Range: 80 - 120 mg / dl Electronic Signature(s) Signed: 11/23/2019 4:07:14 PM By: Army Melia Entered By: Army Melia on 11/22/2019  15:21:52  0 Complex Wound Measurement - multiple wounds INTERVENTIONS - Wound Dressings []  - Small Wound Dressing one or multiple wounds 0 []  - 0 Medium Wound Dressing one or multiple wounds []  - 0 Large Wound Dressing one or multiple wounds []  - 0 Application of Medications - topical []  - 0 Application of Medications - injection INTERVENTIONS - Miscellaneous []  - External ear exam 0 []  - 0 Specimen Collection (cultures, biopsies, blood, body fluids, etc.) []  - 0 Specimen(s) / Culture(s) sent or taken to Lab for analysis []  - 0 Patient Transfer (multiple staff / Harrel Lemon Lift / Similar devices) []  - 0 Simple Staple / Suture removal (25 or less) []  - 0 Complex Staple / Suture removal (26 or more) []  - 0 Hypo / Hyperglycemic Management (close monitor of Blood Glucose) []  - 0 Ankle / Brachial Index (ABI) - do not check if billed separately X- 1 5 Vital Signs Jenna [name], Jenna [name] ([id number]) Has the patient been seen at the hospital within the last three years: Yes Total Score: 80 Level Of Care: New/Established - Level 3 Electronic Signature(s) Signed: 11/22/2019 5:06:49 PM By: Montey Hora Entered By: Montey Hora on 11/22/2019 16:04:59 Jenna [name] ([id number]) -------------------------------------------------------------------------------- Encounter Discharge Information Details Patient Name: Jenna [name] Date of Service: 11/22/2019 3:15 PM Medical Record Number: [id number] Patient Account Number: [id number] Date of Birth/Sex: [birth year] (85 y.o. F) Treating RN: Montey Hora Primary Care Faaris Arizpe: Lujean Amel Other Clinician: Referring Kyron Schlitt: Lujean Amel Treating Lavaughn Bisig/Extender: Melburn Hake, HOYT Weeks in Treatment: 2 Encounter Discharge Information Items Discharge Condition: Stable Ambulatory Status: Cane Discharge Destination:  Home Transportation: Private Auto Accompanied By: self Schedule Follow-up Appointment: Yes Clinical Summary of Care: Electronic Signature(s) Signed: 11/22/2019 4:09:58 PM By: Montey Hora Entered By: Montey Hora on 11/22/2019 16:09:58 Jenna [name] ([id number]) -------------------------------------------------------------------------------- Lower Extremity Assessment Details Patient Name: Jenna [name] Date of Service: 11/22/2019 3:15 PM Medical Record Number: [id number] Patient Account Number: [id number] Date of Birth/Sex: [birth year] (85 y.o. F) Treating RN: Army Melia Primary Care Saragrace Selke: Lujean Amel Other Clinician: Referring Pattricia Weiher: Lujean Amel Treating Najib Colmenares/Extender: STONE III, HOYT Weeks in Treatment: 2 Edema Assessment Assessed: [Left: No] [Right: No] Edema: [Left: N] [Right: o] Calf Left: Right: Point of Measurement: 32 cm From Medial Instep 44 cm cm Ankle Left: Right: Point of Measurement: 10 cm From Medial Instep 24 cm cm Vascular Assessment Pulses: Dorsalis Pedis Palpable: [Left:Yes] Electronic Signature(s) Signed: 11/23/2019 4:07:14 PM By: Army Melia Entered By: Army Melia on 11/22/2019 15:23:24 Jenna [name] ([id number]) -------------------------------------------------------------------------------- Multi Wound Chart Details Patient Name: Jenna [name] Date of Service: 11/22/2019 3:15 PM Medical Record Number: [id number] Patient Account Number: [id number] Date of Birth/Sex: Jul 13, 1931 (85 y.o. F) Treating RN: Montey Hora Primary Care Timothy Townsel: Lujean Amel Other Clinician: Referring Tsugio Elison: Lujean Amel Treating Ramari Bray/Extender: STONE III, HOYT Weeks in Treatment: 2 Vital Signs Height(in): 61 Pulse(bpm): 81 Weight(lbs): 220 Blood Pressure(mmHg): 129/75 Body Mass Index(BMI): 42 Temperature(F): 98.9 Respiratory Rate 16 (breaths/min): Photos: [N/A:N/A] Wound Location: Left Lower Leg - Medial N/A  N/A Wounding Event: Gradually Appeared N/A N/A Primary Etiology: Diabetic Wound/Ulcer of the N/A N/A Lower Extremity Comorbid History: Cataracts, Glaucoma, N/A N/A Lymphedema, Hypertension, Type II Diabetes, Osteoarthritis Date Acquired: 08/30/2019 N/A N/A Weeks of Treatment: 2 N/A N/A Wound Status: Open N/A N/A Measurements L x W x D 0.1x0.1x0.1 N/A N/A (cm) Area (cm) : 0.008 N/A N/A Volume (cm) : 0.001 N/A N/A % Reduction in Area: 99.70% N/A N/A % Reduction in Volume: 99.60% N/A N/A Classification: Grade  Care Ibrohim Simmers: Lujean Amel Other Clinician: Referring Tayona Sarnowski: Lujean Amel Treating Oluwatobi Visser/Extender: STONE III, HOYT Weeks in Treatment: 2 Wound Status Wound Number: 3 Primary Diabetic Wound/Ulcer of the Lower Extremity Etiology: Wound Location: Left Lower Leg - Medial Wound Open Wounding Event: Gradually Appeared Status: Date Acquired: 08/30/2019 Comorbid Cataracts, Glaucoma,  Lymphedema, Weeks Of Treatment: 2 History: Hypertension, Type II Diabetes, Osteoarthritis Clustered Wound: No Photos Wound Measurements Length: (cm) 0.1 % Reduction in Width: (cm) 0.1 % Reduction in Depth: (cm) 0.1 Epithelializat Area: (cm) 0.008 Tunneling: Volume: (cm) 0.001 Undermining: Area: 99.7% Volume: 99.6% ion: Large (67-100%) No No Wound Description Classification: Grade 2 Foul Odor Afte Wound Margin: Flat and Intact Slough/Fibrino Exudate Amount: Small Exudate Type: Serous Exudate Color: amber r Cleansing: No Yes Wound Bed Granulation Amount: Small (1-33%) Exposed Structure Granulation Quality: Pale Fascia Exposed: No Necrotic Amount: Large (67-100%) Fat Layer (Subcutaneous Tissue) Exposed: Yes Necrotic Quality: Eschar Tendon Exposed: No Muscle Exposed: No Joint Exposed: No Bone Exposed: No Treatment Notes Jenna [name], Jenna [name] ([id number]) Wound #3 (Left, Medial Lower Leg) Notes patient's compression only Electronic Signature(s) Signed: 11/23/2019 4:07:14 PM By: Army Melia Entered By: Army Melia on 11/22/2019 15:22:57 Jenna [name] ([id number]) -------------------------------------------------------------------------------- Vitals Details Patient Name: Jenna [name] Date of Service: 11/22/2019 3:15 PM Medical Record Number: [id number] Patient Account Number: [id number] Date of Birth/Sex: November 25, 1931 (85 y.o. F) Treating RN: Army Melia Primary Care Ekam Besson: Lujean Amel Other Clinician: Referring Krystyna Cleckley: Lujean Amel Treating Beatrice Sehgal/Extender: STONE III, HOYT Weeks in Treatment: 2 Vital Signs Time Taken: 15:21 Temperature (F): 98.9 Height (in): 61 Pulse (bpm): 81 Weight (lbs): 220 Respiratory Rate (breaths/min): 16 Body Mass Index (BMI): 41.6 Blood Pressure (mmHg): 129/75 Reference Range: 80 - 120 mg / dl Electronic Signature(s) Signed: 11/23/2019 4:07:14 PM By: Army Melia Entered By: Army Melia on 11/22/2019  15:21:52

## 2019-11-28 IMAGING — CT CT CERVICAL SPINE W/O CM
3 of 4 series · 13 of 33 positions shown, 16 images · non-contrast
Comparison: CT cervical spine 08/22/2014, radiograph 05/16/2015

CLINICAL DATA: Fell and hit head, laceration

EXAM:
CT CERVICAL SPINE WITHOUT CONTRAST
TECHNIQUE: Multidetector CT imaging of the cervical spine was performed without
intravenous contrast. Multiplanar CT image reconstructions were also
generated.

[Series 5: coronal bone · coronal · 0.31mm/px · 3 of 49 slices shown]
[im 10/49  bone]
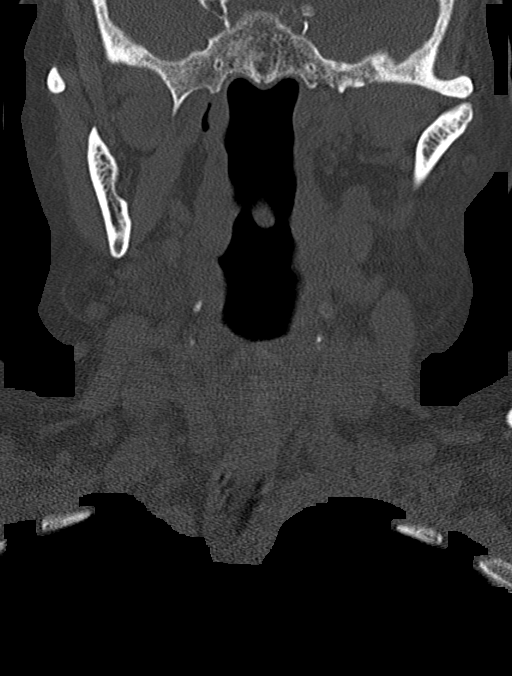
[im 20/49  bone]
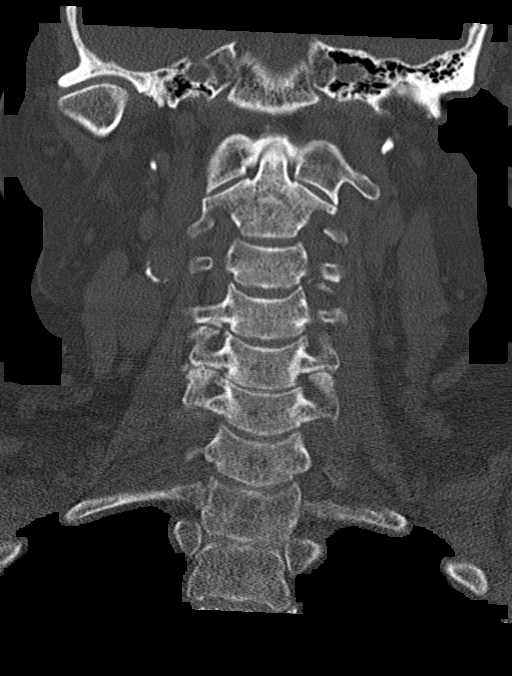
[im 29/49  bone]
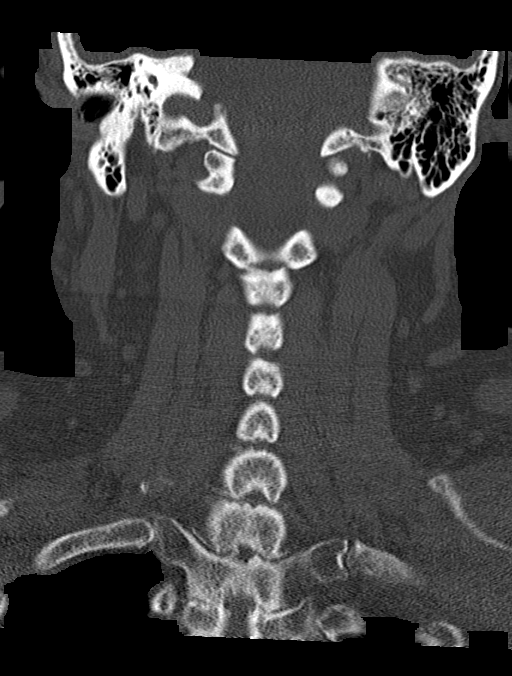

[Series 6: orthogonal bone · axial · 0.29mm/px · z∈[-288,-178]mm · 5 of 89 slices shown, 7 images]
[im 15/89  soft-tissue]
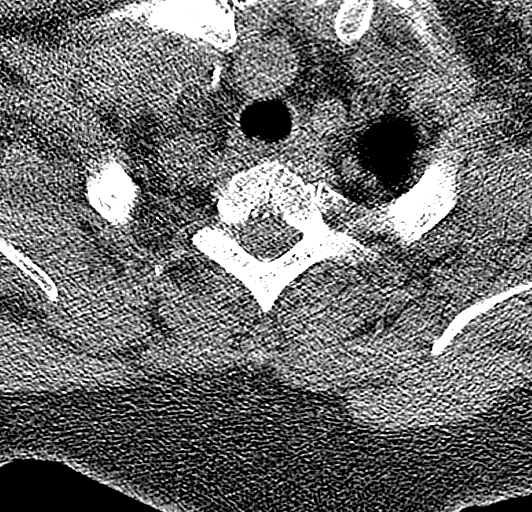
[im 15/89  bone]
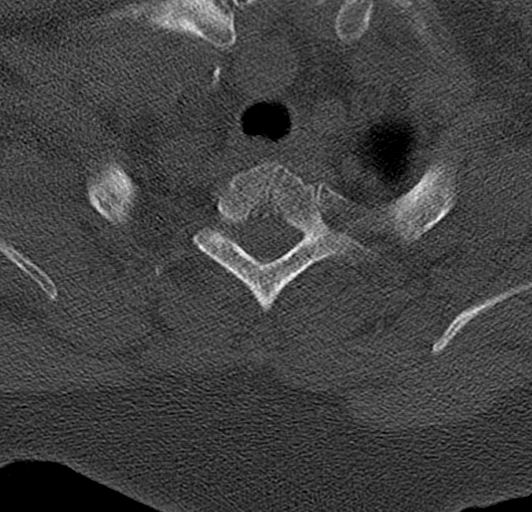
[im 30/89  bone]
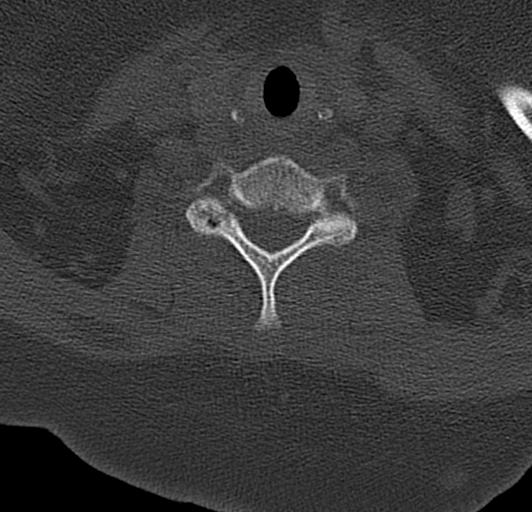
[im 45/89  bone]
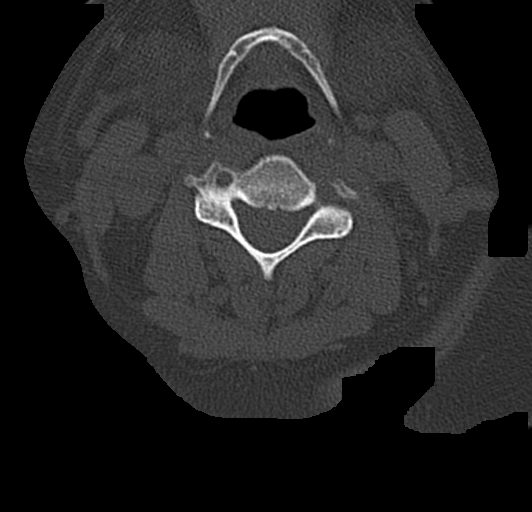
[im 59/89  bone]
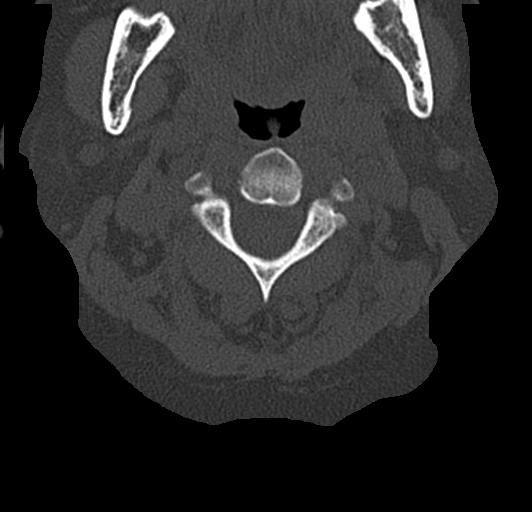
[im 74/89  soft-tissue]
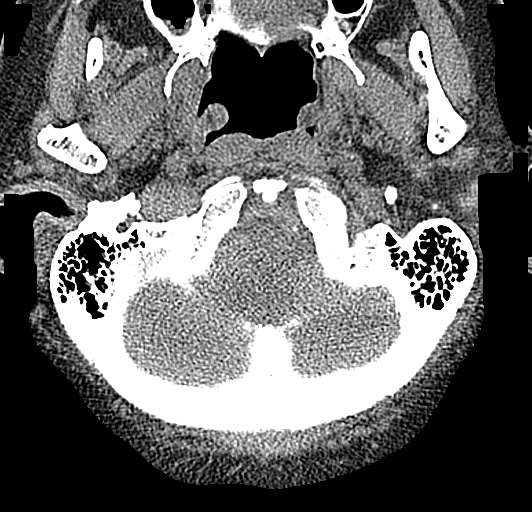
[im 74/89  bone]
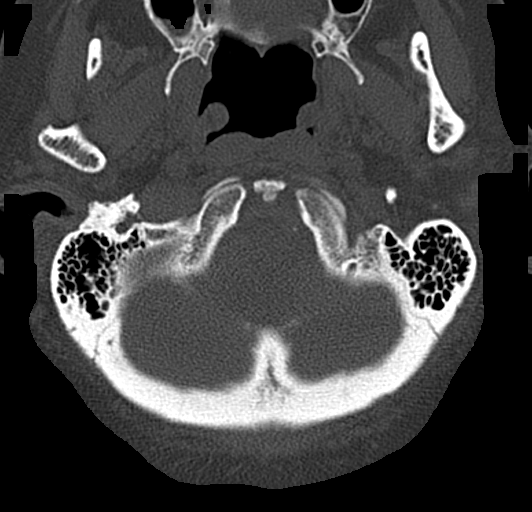

[Series 7: sagittal bone · sagittal · 0.31mm/px · 5 of 41 slices shown, 6 images]
[im 14/41  bone]
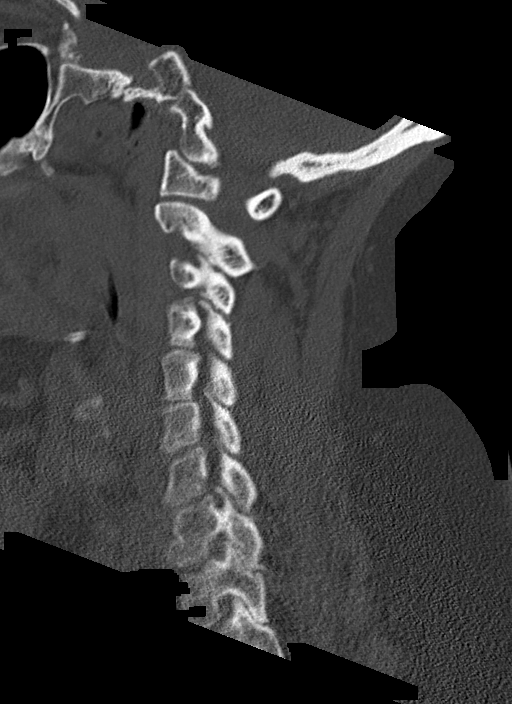
[im 17/41  bone]
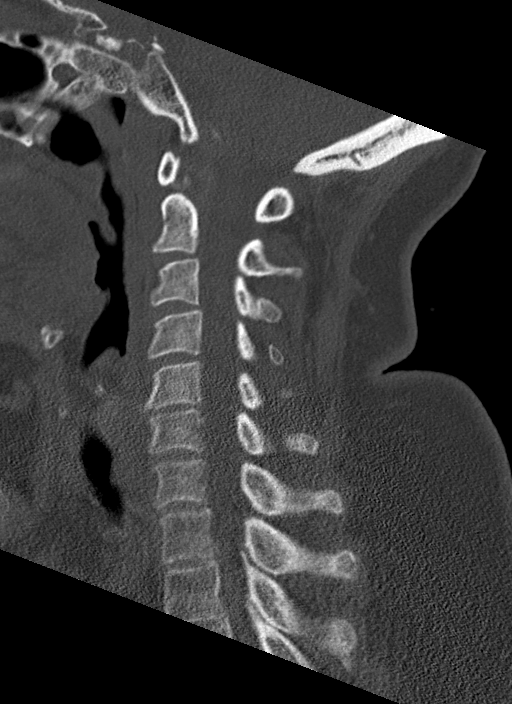
[im 21/41  soft-tissue]
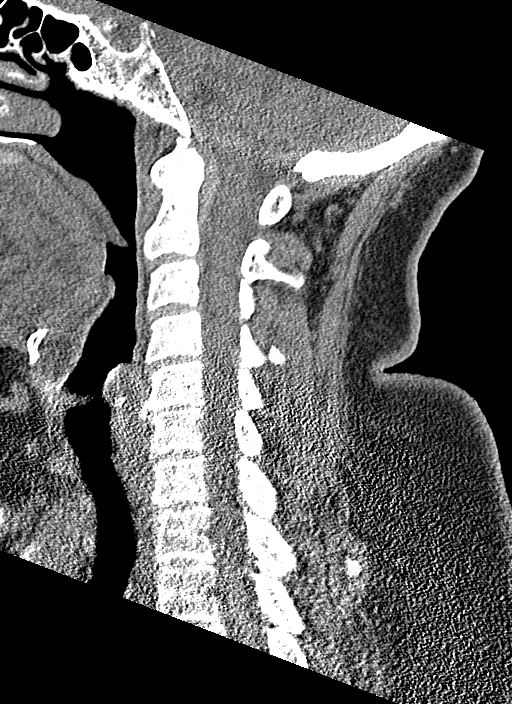
[im 21/41  bone]
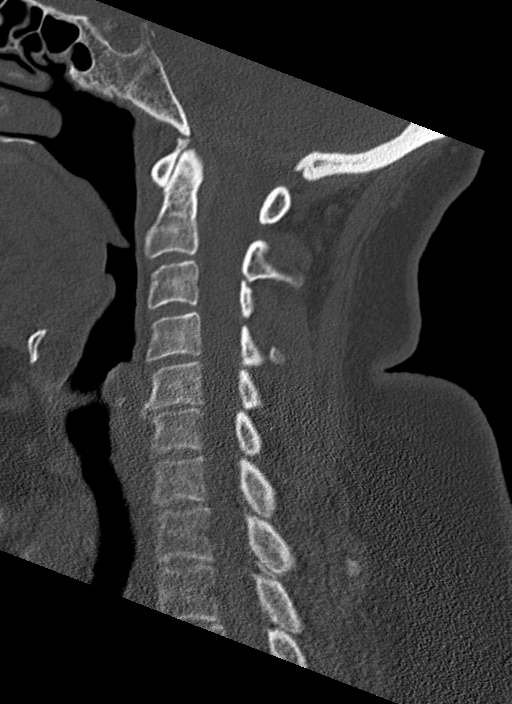
[im 24/41  bone]
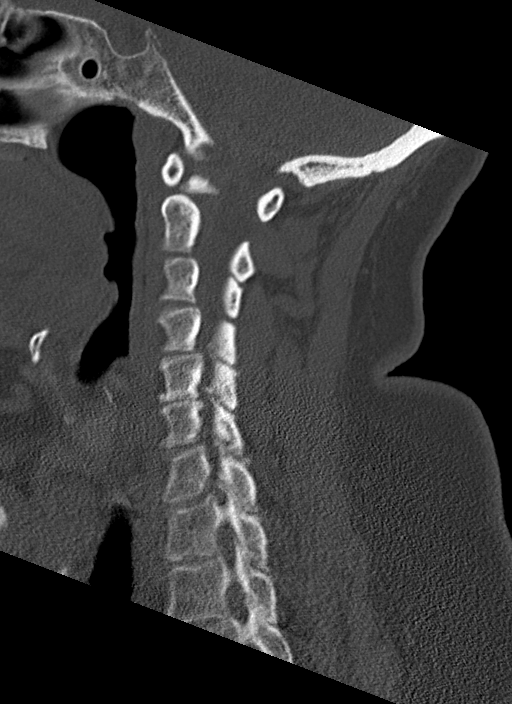
[im 27/41  bone]
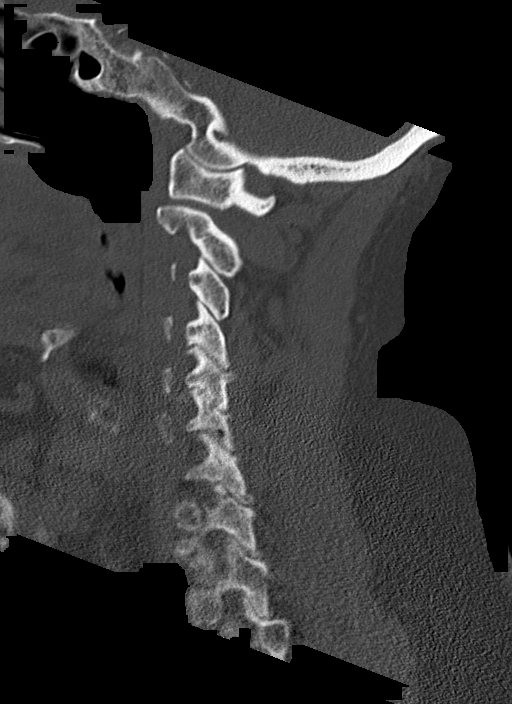

[13 of 33 positions shown; findings below may reference images not displayed]

FINDINGS: Alignment: Straightening of the cervical spine. No subluxation.
Facet alignment is maintained

Skull base and vertebrae: No acute fracture. No primary bone lesion
or focal pathologic process.

Soft tissues and spinal canal: No prevertebral fluid or swelling. No
visible canal hematoma.

Disc levels: Mild disc space narrowing at C4-C5, C5-C6. Multiple
level facet degenerative change. Multiple level bilateral foraminal
stenosis.

Upper chest: Negative.

Other: Carotid vascular calcification.
IMPRESSION: Straightening of the cervical spine.  No acute osseous abnormality

## 2019-11-30 ENCOUNTER — Encounter: Payer: Medicare Other | Attending: Internal Medicine | Admitting: Internal Medicine

## 2019-11-30 ENCOUNTER — Other Ambulatory Visit: Payer: Self-pay

## 2019-11-30 DIAGNOSIS — L97221 Non-pressure chronic ulcer of left calf limited to breakdown of skin: Secondary | ICD-10-CM | POA: Insufficient documentation

## 2019-11-30 DIAGNOSIS — E11622 Type 2 diabetes mellitus with other skin ulcer: Secondary | ICD-10-CM | POA: Insufficient documentation

## 2019-11-30 DIAGNOSIS — L97322 Non-pressure chronic ulcer of left ankle with fat layer exposed: Secondary | ICD-10-CM | POA: Diagnosis not present

## 2019-11-30 DIAGNOSIS — E1151 Type 2 diabetes mellitus with diabetic peripheral angiopathy without gangrene: Secondary | ICD-10-CM | POA: Insufficient documentation

## 2019-11-30 DIAGNOSIS — I10 Essential (primary) hypertension: Secondary | ICD-10-CM | POA: Diagnosis not present

## 2019-11-30 DIAGNOSIS — M1990 Unspecified osteoarthritis, unspecified site: Secondary | ICD-10-CM | POA: Diagnosis not present

## 2019-11-30 DIAGNOSIS — I87333 Chronic venous hypertension (idiopathic) with ulcer and inflammation of bilateral lower extremity: Secondary | ICD-10-CM | POA: Diagnosis not present

## 2019-11-30 NOTE — Progress Notes (Signed)
VIVIE, GEDDES ([id number]) Visit Report for 11/30/2019 HPI Details Patient Name: Jenna [name], Jenna [name]. Date of Service: 11/30/2019 10:45 AM Medical Record Number: [id number] Patient Account Number: [id number] Date of Birth/Sex: 1931/08/04 (85 y.o. F) Treating RN: Harold Barban Primary Care Provider: Lujean Amel Other Clinician: Referring Provider: Lujean Amel Treating Provider/Extender: Tito Dine in Treatment: 4 History of Present Illness Location: left lower extremity in the medial ankle region Quality: Patient reports experiencing a dull pain to affected area(s). Severity: Patient states wound are getting worse. Duration: Patient has had the wound for > 3 months prior to seeking treatment at the wound center Timing: Pain in wound is Intermittent (comes and goes Context: The wound appeared gradually over time Modifying Factors: Other treatment(s) tried include:[name] Associated Signs and Symptoms: Patient reports having increase swelling. HPI Description: 85 year old patient with a past medical history significant for venous stasis ulceration and diabetes mellitus was recently admitted to the hospital between 07/07/2016 and 07/09/2016. She was wound to have a nonpurulent cellulitis of the left lower extremity and was started on IV antibiotics which included vancomycin. He is discharged home with her and Unna's boots and oral doxycycline. During this admission there was no obvious DVT involving the left lower extremity. her past medical history significant for diabetes mellitus, hypertension, hyperlipidemia, varicose veins, status post right rotator cuff repair, knee surgery on the left,robotic abdominal hysterectomy, laparoscopic cholecystectomy. She is not a smoker. Venous study done on 07/18/2015 showed normal left extremity deep venous system with no evidence of DVT. There was extensive valvular incompetence  and reflux throughout the left greater saphenous vein with direct the medication to subcutaneous varicose veins. Normal left small saphenous vein. I understand she was seen by vascular radiology group and the workup and recommendation was for endovenous ablation but due to family pressure she was not able to keep her appointment a year ago. She has not been wearing her compression stockings regularly. 05/26/18 READMISSION this is an 85 year old woman who was previously seen in this clinic in 2017 by Dr. Con Memos. She has chronic venous insufficiency with lymphedema and at that time had open area on the left medial calf and ankle area. Was recommended that she wear 20- 30 mm compression stockings and the patient tells me that she has been compliant with this although I think her primary doctor recently told her that she didn't need to wear stockings out of fear it might cause arterial compression. She noticed edema and pain in the area a week to 2 ago. She saw her primary doctor and was given doxycycline and Silvadene cream to put on the area. She states things are a lot better. The patient has a history of type 2 diabetes on oral agents but she is unaware of her hemoglobin A1c for her blood glucose which she doesn't check. She has hypertension, varicose veins, rotator cuff repair, knee surgery on the left, history of a laparoscopic cholecystectomy, lymphedema, obstructive sleep apnea, history of endometrial CA. The patient has been to see vein and vascular in the past and I think has had an ablation of the left greater saphenous vein based on ultrasounds of the left leg IC dating back to 2017. Her most recent ultrasound was in May 2018 which showed no evidence of a DVT and continued durable closure of the treated segment of the left greater saphenous vein. Patent lateral accessory branch of the greater saphenous vein extending to the calf ABIs in our  clinic were 1.05 on the right and 0.95 on the  left 06/02/18 the patient's wound on the left medial lower calf is completely closed and epithelialized. She has new 20-30 mm below- knee stockings Jenna [name], Jenna [name] ([id number]) READMISSION 11/02/2019 This is a now 85 year old woman who has been in this clinic 2 times before. Most recently in 2019. She has chronic venous insufficiency with some degree of secondary lymphedema. She has had a history of a left greater saphenous vein ablation. When she is here in 2019 she had a wound on the left medial lower calf. This closed fairly easily. It was recommended she wear 20/30 mm below-knee stockings she is not compliant with this. She arrives in clinic with a 2-week history of a left medial lower extremity and ankle wound. Some of this has dry slough on it but most of it is already epithelialize she has been using a combination of Vaseline and/or Silvadene. She is not wearing any compression. She has a history of chronic venous reflux. There was recommendations in the past for ablations I do not know that she ever carried through with this. According to notes from 2016 this work-up was done via the interventional radiology group. We will need to research this. She is probably going to need a consultation ABIs in our clinic were 1.03 on the right and 0.93 on the left 11/11; patient's wound looks as though it is epithelializing horizontal wound on the left medial lower extremity. She has a superior satellite lesion. She is complaining of a lot of pain in 3 layer compression. There have been recommendations for previous ablations I am not sure if she followed up on this. She has been noncompliant with stockings although she brought those into the clinic today. 11/18; the patient had a repeat venous reflux studies at vein and vascular. This did not show any DVT in the left lower extremity there was no evidence of chronic venous insufficiency and no evidence of superficial vein thrombosis. I looked back at  previous studies done I think in interventional radiology in 2018 would suggest that there had been previous ablation of a segment of the left greater saphenous vein. By review of the new study I do not think the patient needs to see vein and vascular however I find these studies increasingly difficult to interpret. I am not sure that the patient has actually consistently worn compression stockings and I think that is the next step here. The wound is just about closed 11/22/2019 upon evaluation today patient actually appears to be healed based on what I am seeing today. She has been tolerating the dressing changes without complication. Fortunately there is no signs of active infection at this time. Overall I feel like even though this is the first time of seeing her that she has actually done extremely well with the current wound care measures again she has been under the care of Dr. Dellia Nims. 12/2 the patient was expected to be healed this week however she comes in with 3 small wounds that look much the same as the pictures from 2 weeks ago. These are all in the area of the left medial lower extremity. She was put into her own compression stockings last week I do not think the edema control was adequate in this area for healing. We have been using Hydrofera Blue Electronic Signature(s) Signed: 11/30/2019 4:48:24 PM By: Linton Ham MD Entered By: Linton Ham on 11/30/2019 12:29:08 Jenna [name] ([id number]) -------------------------------------------------------------------------------- Physical Exam Details Patient Name:  Jenna [name] Date of Service: 11/30/2019 10:45 AM Medical Record Number: [id number] Patient Account Number: [id number] Date of Birth/Sex: January 15, 1931 (85 y.o. F) Treating RN: Harold Barban Primary Care Provider: Lujean Amel Other Clinician: Referring Provider: Lujean Amel Treating Provider/Extender: Tito Dine in Treatment:  4 Constitutional Sitting or standing Blood Pressure is within target range for patient.. Pulse regular and within target range for patient.Marland Kitchen Respirations regular, non-labored and within target range.Marland Kitchen appears in no distress. Respiratory Respiratory effort is easy and symmetric bilaterally. Rate is normal at rest and on room air.. Cardiovascular Pedal pulses are palpable. Integumentary (Hair, Skin) No erythema around the wound. Psychiatric No evidence of depression, anxiety, or agitation. Calm, cooperative, and communicative. Appropriate interactions and affect.. Notes Wound exam; reopening or perhaps new wound areas I am really not sure. But looking back on the pictures from last week or 2 weeks ago I think these are probably in the same area. Last week it only seemed that there is one area. There was clearly 3 this time. Edema control is not adequate. I had peripheral pulses palpable Electronic Signature(s) Signed: 11/30/2019 4:48:24 PM By: Linton Ham MD Entered By: Linton Ham on 11/30/2019 12:30:51 Jenna [name] ([id number]) -------------------------------------------------------------------------------- Physician Orders Details Patient Name: Jenna [name] Date of Service: 11/30/2019 10:45 AM Medical Record Number: [id number] Patient Account Number: [id number] Date of Birth/Sex: 1931-10-25 (85 y.o. F) Treating RN: Harold Barban Primary Care Provider: Lujean Amel Other Clinician: Referring Provider: Lujean Amel Treating Provider/Extender: Tito Dine in Treatment: 4 Verbal / Phone Orders: No Diagnosis Coding Wound Cleansing Wound #3 Left,Medial Lower Leg o Clean wound with Normal Saline. o May shower with protection. Anesthetic (add to Medication List) Wound #3 Left,Medial Lower Leg o Topical Lidocaine 4% cream applied to wound bed prior to debridement (In Clinic Only). Skin Barriers/Peri-Wound Care Wound #3 Left,Medial Lower Leg o  Barrier cream o Moisturizing lotion Primary Wound Dressing Wound #3 Left,Medial Lower Leg o Hydrafera Blue Ready Transfer Dressing Change Frequency Wound #3 Left,Medial Lower Leg o Change dressing every week o Other: - Nurse Visit if wrap slides down Follow-up Appointments Wound #3 Left,Medial Lower Leg o Return Appointment in 1 week. o Nurse Visit as needed Edema Control Wound #3 Left,Medial Lower Leg o 3 Layer Compression System - Left Lower Extremity Additional Orders / Instructions Wound #3 Left,Medial Lower Leg o Increase protein intake. Electronic Signature(s) Signed: 11/30/2019 4:10:47 PM By: Harold Barban Signed: 11/30/2019 4:48:24 PM By: Linton Ham MD Jenna [name] ([id number]) Entered By: Harold Barban on 11/30/2019 11:49:08 Jenna [name], Jenna [name] ([id number]) -------------------------------------------------------------------------------- Problem List Details Patient Name: Jenna [name], CROSS. Date of Service: 11/30/2019 10:45 AM Medical Record Number: [id number] Patient Account Number: [id number] Date of Birth/Sex: 05-15-1931 (85 y.o. F) Treating RN: Harold Barban Primary Care Provider: Lujean Amel Other Clinician: Referring Provider: Lujean Amel Treating Provider/Extender: Tito Dine in Treatment: 4 Active Problems ICD-10 Evaluated Encounter Code Description Active Date Today Diagnosis L97.221 Non-pressure chronic ulcer of left calf limited to breakdown of 11/02/2019 No Yes skin I87.333 Chronic venous hypertension (idiopathic) with ulcer and 11/16/2019 No Yes inflammation of bilateral lower extremity Inactive Problems Resolved Problems Electronic Signature(s) Signed: 11/30/2019 4:48:24 PM By: Linton Ham MD Entered By: Linton Ham on 11/30/2019 12:27:36 Jenna [name] ([id number]) -------------------------------------------------------------------------------- Progress Note Details Patient Name: Jenna [name] Date of Service: 11/30/2019 10:45 AM Medical Record Number: [id number] Patient Account Number: [id number] Date of Birth/Sex: May 28, 1931 (85 y.o. F) Treating RN: Harold Barban Primary Care  Provider: Lujean Amel Other Clinician: Referring Provider: Lujean Amel Treating Provider/Extender: Tito Dine in Treatment: 4 Subjective History of Present Illness (HPI) The following HPI elements were documented for the patient's wound: Location: left lower extremity in the medial ankle region Quality: Patient reports experiencing a dull pain to affected area(s). Severity: Patient states wound are getting worse. Duration: Patient has had the wound for > 3 months prior to seeking treatment at the wound center Timing: Pain in wound is Intermittent (comes and goes Context: The wound appeared gradually over time Modifying Factors: Other treatment(s) tried include:[name] Associated Signs and Symptoms: Patient reports having increase swelling. 85 year old patient with a past medical history significant for venous stasis ulceration and diabetes mellitus was recently admitted to the hospital between 07/07/2016 and 07/09/2016. She was wound to have a nonpurulent cellulitis of the left lower extremity and was started on IV antibiotics which included vancomycin. He is discharged home with her and Unna's boots and oral doxycycline. During this admission there was no obvious DVT involving the left lower extremity. her past medical history significant for diabetes mellitus, hypertension, hyperlipidemia, varicose veins, status post right rotator cuff repair, knee surgery on the left,robotic abdominal hysterectomy, laparoscopic cholecystectomy. She is not a smoker. Venous study done on 07/18/2015 showed normal left extremity deep venous system with no evidence of DVT. There was extensive valvular incompetence and reflux throughout the  left greater saphenous vein with direct the medication to subcutaneous varicose veins. Normal left small saphenous vein. I understand she was seen by vascular radiology group and the workup and recommendation was for endovenous ablation but due to family pressure she was not able to keep her appointment a year ago. She has not been wearing her compression stockings regularly. 05/26/18 READMISSION this is an 85 year old woman who was previously seen in this clinic in 2017 by Dr. Con Memos. She has chronic venous insufficiency with lymphedema and at that time had open area on the left medial calf and ankle area. Was recommended that she wear 20- 30 mm compression stockings and the patient tells me that she has been compliant with this although I think her primary doctor recently told her that she didn't need to wear stockings out of fear it might cause arterial compression. She noticed edema and pain in the area a week to 2 ago. She saw her primary doctor and was given doxycycline and Silvadene cream to put on the area. She states things are a lot better. The patient has a history of type 2 diabetes on oral agents but she is unaware of her hemoglobin A1c for her blood glucose which she doesn't check. She has hypertension, varicose veins, rotator cuff repair, knee surgery on the left, history of a laparoscopic cholecystectomy, lymphedema, obstructive sleep apnea, history of endometrial CA. The patient has been to see vein and vascular in the past and I think has had an ablation of the left greater saphenous vein based on ultrasounds of the left leg IC dating back to 2017. Her most recent ultrasound was in May 2018 which showed no evidence of a DVT and continued durable closure of the treated segment of the left greater saphenous vein. Patent lateral accessory branch of the greater saphenous vein extending to the calf ABIs in our clinic were 1.05 on the right and 0.95 on the left 06/02/18 the patient's  wound on the left medial lower calf is completely closed and epithelialized. She has new 20-30 mm  below- knee stockings Jenna [name], Jenna [name] ([id number]) [place] 11/02/2019 This is a now 85 year old woman who has been in this clinic 2 times before. Most recently in 2019. She has chronic venous insufficiency with some degree of secondary lymphedema. She has had a history of a left greater saphenous vein ablation. When she is here in 2019 she had a wound on the left medial lower calf. This closed fairly easily. It was recommended she wear 20/30 mm below-knee stockings she is not compliant with this. She arrives in clinic with a 2-week history of a left medial lower extremity and ankle wound. Some of this has dry slough on it but most of it is already epithelialize she has been using a combination of Vaseline and/or Silvadene. She is not wearing any compression. She has a history of chronic venous reflux. There was recommendations in the past for ablations I do not know that she ever carried through with this. According to notes from 2016 this work-up was done via the interventional radiology group. We will need to research this. She is probably going to need a consultation ABIs in our clinic were 1.03 on the right and 0.93 on the left 11/11; patient's wound looks as though it is epithelializing horizontal wound on the left medial lower extremity. She has a superior satellite lesion. She is complaining of a lot of pain in 3 layer compression. There have been recommendations for previous ablations I am not sure if she followed up on this. She has been noncompliant with stockings although she brought those into the clinic today. 11/18; the patient had a repeat venous reflux studies at vein and vascular. This did not show any DVT in the left lower extremity there was no evidence of chronic venous insufficiency and no evidence of superficial vein thrombosis. I looked back at previous studies done I  think in interventional radiology in 2018 would suggest that there had been previous ablation of a segment of the left greater saphenous vein. By review of the new study I do not think the patient needs to see vein and vascular however I find these studies increasingly difficult to interpret. I am not sure that the patient has actually consistently worn compression stockings and I think that is the next step here. The wound is just about closed 11/22/2019 upon evaluation today patient actually appears to be healed based on what I am seeing today. She has been tolerating the dressing changes without complication. Fortunately there is no signs of active infection at this time. Overall I feel like even though this is the first time of seeing her that she has actually done extremely well with the current wound care measures again she has been under the care of Dr. Dellia Nims. 12/2 the patient was expected to be healed this week however she comes in with 3 small wounds that look much the same as the pictures from 2 weeks ago. These are all in the area of the left medial lower extremity. She was put into her own compression stockings last week I do not think the edema control was adequate in this area for healing. We have been using Hydrofera Blue Objective Constitutional Sitting or standing Blood Pressure is within target range for patient.. Pulse regular and within target range for patient.Marland Kitchen Respirations regular, non-labored and within target range.Marland Kitchen appears in no distress. Vitals Time Taken: 10:55 AM, Height: 61 in, Weight: 220 lbs, BMI: 41.6, Temperature: 98.0 F, Pulse: 78 bpm, Respiratory Rate: 16 breaths/min, Blood Pressure: 140/68 mmHg.  Respiratory Respiratory effort is easy and symmetric bilaterally. Rate is normal at rest and on room air.. Cardiovascular AEON, TOFTE ([id number]) Pedal pulses are palpable. Psychiatric No evidence of depression, anxiety, or agitation. Calm, cooperative,  and communicative. Appropriate interactions and affect.. General Notes: Wound exam; reopening or perhaps new wound areas I am really not sure. But looking back on the pictures from last week or 2 weeks ago I think these are probably in the same area. Last week it only seemed that there is one area. There was clearly 3 this time. Edema control is not adequate. I had peripheral pulses palpable Integumentary (Hair, Skin) No erythema around the wound. Wound #3 status is Open. Original cause of wound was Gradually Appeared. The wound is located on the Left,Medial Lower Leg. The wound measures 2.5cm length x 2cm width x 0.1cm depth; 3.927cm^2 area and 0.393cm^3 volume. There is Fat Layer (Subcutaneous Tissue) Exposed exposed. There is no tunneling or undermining noted. There is a small amount of serous drainage noted. The wound margin is flat and intact. There is medium (34-66%) pale granulation within the wound bed. There is a medium (34-66%) amount of necrotic tissue within the wound bed including Adherent Slough. Assessment Active Problems ICD-10 Non-pressure chronic ulcer of left calf limited to breakdown of skin Chronic venous hypertension (idiopathic) with ulcer and inflammation of bilateral lower extremity Plan Wound Cleansing: Wound #3 Left,Medial Lower Leg: Clean wound with Normal Saline. May shower with protection. Anesthetic (add to Medication List): Wound #3 Left,Medial Lower Leg: Topical Lidocaine 4% cream applied to wound bed prior to debridement (In Clinic Only). Skin Barriers/Peri-Wound Care: Wound #3 Left,Medial Lower Leg: Barrier cream Moisturizing lotion Primary Wound Dressing: Wound #3 Left,Medial Lower Leg: Hydrafera Blue Ready Transfer Dressing Change Frequency: Wound #3 Left,Medial Lower Leg: Change dressing every week Other: - Nurse Visit if wrap slides down Follow-up Appointments: Wound #3 Left,Medial Lower Leg: Return Appointment in 1 week. Jenna [name], Jenna [name]  ([id number]) Nurse Visit as needed Edema Control: Wound #3 Left,Medial Lower Leg: 3 Layer Compression System - Left Lower Extremity Additional Orders / Instructions: Wound #3 Left,Medial Lower Leg: Increase protein intake. 1. I continued with Hydrofera Blue but I put her back in 3 layer compression. She does not have good edema control with her own compression stockings and I am suspicious that we have had some reopening. 2. There is no evidence of infection 3. Although we help were using kerlix and Coban I cannot see why she could not tolerate 3 layer compression her ABIs were quite good peripheral pulses were palpable today 4. 3 small separate areas in the left medial lower leg. One roughly at 7:00 1 at 3-5 o'clock which is most extensive and one at 1:00 Electronic Signature(s) Signed: 11/30/2019 4:48:24 PM By: Linton Ham MD Entered By: Linton Ham on 11/30/2019 12:32:24 Jenna [name] ([id number]) -------------------------------------------------------------------------------- SuperBill Details Patient Name: Jenna [name] Date of Service: 11/30/2019 Medical Record Number: [id number] Patient Account Number: [id number] Date of Birth/Sex: Apr 30, 1931 (85 y.o. F) Treating RN: Harold Barban Primary Care Provider: Lujean Amel Other Clinician: Referring Provider: Lujean Amel Treating Provider/Extender: Tito Dine in Treatment: 4 Diagnosis Coding ICD-10 Codes Code Description [phone] Non-pressure chronic ulcer of left calf limited to breakdown of skin I87.333 Chronic venous hypertension (idiopathic) with ulcer and inflammation of bilateral lower extremity Facility Procedures CPT4 Code: [id number] Description: [id number] - WOUND CARE VISIT-LEV 2 EST PT Modifier: Quantity: 1 Physician Procedures CPT4: Description Modifier Quantity Code [id number] - WC  PHYS LEVEL 3 - EST PT 1 ICD-10 Diagnosis Description L97.221 Non-pressure chronic ulcer of left calf  limited to breakdown of skin I87.333 Chronic venous hypertension (idiopathic) with ulcer and  inflammation of bilateral lower extremity Electronic Signature(s) Signed: 11/30/2019 4:48:24 PM By: Linton Ham MD Entered By: Linton Ham on 11/30/2019 12:32:44

## 2019-11-30 NOTE — Progress Notes (Signed)
Jenna [name], Jenna [name] ([id number]) Visit Report for 11/30/2019 Arrival Information Details Patient Name: Jenna [name], Jenna [name]. Date of Service: 11/30/2019 10:45 AM Medical Record Number: [id number] Patient Account Number: [id number] Date of Birth/Sex: 1931/05/24 (85 y.o. F) Treating RN: Army Melia Primary Care Melvinia Ashby: Lujean Amel Other Clinician: Referring Axten Pascucci: Lujean Amel Treating Jameika Kinn/Extender: Tito Dine in Treatment: 4 Visit Information History Since Last Visit Added or deleted any medications: No Patient Arrived: Ambulatory Any new allergies or adverse reactions: No Arrival Time: 10:55 Had a fall or experienced change in No Accompanied By: self activities of daily living that may affect Transfer Assistance: None risk of falls: Patient Identification Verified: Yes Signs or symptoms of abuse/neglect since last visito No Hospitalized since last visit: No Has Dressing in Place as Prescribed: Yes Pain Present Now: No Electronic Signature(s) Signed: 11/30/2019 11:52:14 AM By: Army Melia Entered By: Army Melia on 11/30/2019 10:55:46 Jenna [name] ([id number]) -------------------------------------------------------------------------------- Clinic Level of Care Assessment Details Patient Name: Jenna [name] Date of Service: 11/30/2019 10:45 AM Medical Record Number: [id number] Patient Account Number: [id number] Date of Birth/Sex: July 27, 1931 (85 y.o. F) Treating RN: Harold Barban Primary Care Kala Gassmann: Lujean Amel Other Clinician: Referring Haneefah Venturini: Lujean Amel Treating Damiano Stamper/Extender: Tito Dine in Treatment: 4 Clinic Level of Care Assessment Items TOOL 4 Quantity Score []  - Use when only an EandM is performed on FOLLOW-UP visit 0 ASSESSMENTS - Nursing Assessment / Reassessment X - Reassessment of Co-morbidities (includes updates in patient status) 1 10 X- 1 5 Reassessment of Adherence to Treatment Plan ASSESSMENTS -  Wound and Skin Assessment / Reassessment X - Simple Wound Assessment / Reassessment - one wound 1 5 []  - 0 Complex Wound Assessment / Reassessment - multiple wounds []  - 0 Dermatologic / Skin Assessment (not related to wound area) ASSESSMENTS - Focused Assessment []  - Circumferential Edema Measurements - multi extremities 0 []  - 0 Nutritional Assessment / Counseling / Intervention []  - 0 Lower Extremity Assessment (monofilament, tuning fork, pulses) []  - 0 Peripheral Arterial Disease Assessment (using hand held doppler) ASSESSMENTS - Ostomy and/or Continence Assessment and Care []  - Incontinence Assessment and Management 0 []  - 0 Ostomy Care Assessment and Management (repouching, etc.) PROCESS - Coordination of Care X - Simple Patient / Family Education for ongoing care 1 15 []  - 0 Complex (extensive) Patient / Family Education for ongoing care []  - 0 Staff obtains Programmer, systems, Records, Test Results / Process Orders []  - 0 Staff telephones HHA, Nursing Homes / Clarify orders / etc []  - 0 Routine Transfer to another Facility (non-emergent condition) []  - 0 Routine Hospital Admission (non-emergent condition) []  - 0 New Admissions / Biomedical engineer / Ordering NPWT, Apligraf, etc. []  - 0 Emergency Hospital Admission (emergent condition) X- 1 10 Simple Discharge Coordination Jenna [name], Jenna [name] ([id number]) []  - 0 Complex (extensive) Discharge Coordination PROCESS - Special Needs []  - Pediatric / Minor Patient Management 0 []  - 0 Isolation Patient Management []  - 0 Hearing / Language / Visual special needs []  - 0 Assessment of Community assistance (transportation, D/C planning, etc.) []  - 0 Additional assistance / Altered mentation []  - 0 Support Surface(s) Assessment (bed, cushion, seat, etc.) INTERVENTIONS - Wound Cleansing / Measurement X - Simple Wound Cleansing - one wound 1 5 []  - 0 Complex Wound Cleansing - multiple wounds X- 1 5 Wound Imaging  (photographs - any number of wounds) []  - 0 Wound Tracing (instead of photographs) X- 1 5 Simple Wound Measurement - one wound []  -  Medium (34-66%) N/A N/A Granulation Quality: Pale N/A N/A Necrotic Amount: Medium (34-66%) N/A N/A Exposed Structures: Fat Layer (Subcutaneous N/A N/A Tissue) Exposed: Yes Fascia: No Tendon: No Jenna [name], Jenna [name] ([id number]) Muscle: No Joint: No Bone: No Epithelialization: Medium (34-66%) N/A N/A Treatment Notes Wound #3 (Left, Medial Lower Leg) Notes Hblue, ABD, 3-Layer Left, unna to anchor Electronic Signature(s) Signed: 11/30/2019 4:48:24 PM By: Linton Ham MD Entered By: Linton Ham on 11/30/2019 12:27:47 Jenna [name] ([id number]) -------------------------------------------------------------------------------- [place] Details Patient Name: Jenna [name] Date of Service: 11/30/2019 10:45 AM Medical Record Number: [id number] Patient Account Number: [id number] Date of Birth/Sex: May 11, 1931 (85 y.o. F) Treating RN: Harold Barban Primary Care Dawne Casali: Lujean Amel Other Clinician: Referring Aliza Moret: Lujean Amel Treating Asah Lamay/Extender: Tito Dine in Treatment: 4 Active Inactive Necrotic Tissue Nursing Diagnoses: Impaired tissue integrity related to necrotic/devitalized tissue Goals: Necrotic/devitalized tissue will be minimized in the wound bed Date Initiated: 11/02/2019 Target Resolution Date: 11/09/2019 Goal Status: Active Interventions: Assess patient pain level  pre-, during and post procedure and prior to discharge Treatment Activities: Apply topical anesthetic as ordered : 11/02/2019 Notes: Orientation to the Wound Care Program Nursing Diagnoses: Knowledge deficit related to the wound healing center program Goals: Patient/caregiver will verbalize understanding of the [facility] Date Initiated: 11/02/2019 Target Resolution Date: 11/09/2019 Goal Status: Active Interventions: Provide education on orientation to the wound center Notes: Venous Leg Ulcer Nursing Diagnoses: Potential for venous Insuffiency (use before diagnosis confirmed) Goals: Non-invasive venous studies are completed as ordered Date Initiated: 11/02/2019 Target Resolution Date: 11/09/2019 Goal Status: Active Jenna [name], Jenna [name] ([id number]) Interventions: Assess peripheral edema status every visit. Provide education on venous insufficiency Treatment Activities: Therapeutic compression applied : 11/02/2019 Notes: Wound/Skin Impairment Nursing Diagnoses: Impaired tissue integrity Goals: Ulcer/skin breakdown will have a volume reduction of 30% by week 4 Date Initiated: 11/02/2019 Target Resolution Date: 11/30/2019 Goal Status: Active Interventions: Assess patient/caregiver ability to obtain necessary supplies Notes: Electronic Signature(s) Signed: 11/30/2019 4:10:47 PM By: Harold Barban Entered By: Harold Barban on 11/30/2019 11:44:32 Jenna [name] ([id number]) -------------------------------------------------------------------------------- Pain Assessment Details Patient Name: Jenna [name] Date of Service: 11/30/2019 10:45 AM Medical Record Number: [id number] Patient Account Number: [id number] Date of Birth/Sex: 11-Jul-1931 (85 y.o. F) Treating RN: Army Melia Primary Care Zac Torti: Lujean Amel Other Clinician: Referring Rubin Dais: Lujean Amel Treating Joyanna Kleman/Extender: Tito Dine in Treatment: 4 Active  Problems Location of Pain Severity and Description of Pain Patient Has Paino No Site Locations Pain Management and Medication Current Pain Management: Electronic Signature(s) Signed: 11/30/2019 11:52:14 AM By: Army Melia Entered By: Army Melia on 11/30/2019 10:55:51 Jenna [name] ([id number]) -------------------------------------------------------------------------------- Patient/Caregiver Education Details Patient Name: Jenna [name] Date of Service: 11/30/2019 10:45 AM Medical Record Number: [id number] Patient Account Number: [id number] Date of Birth/Gender: [birth year] (85 y.o. F) Treating RN: Harold Barban Primary Care Physician: Lujean Amel Other Clinician: Referring Physician: Lujean Amel Treating Physician/Extender: Tito Dine in Treatment: 4 Education Assessment Education Provided To: Patient Education Topics Provided Venous: Handouts: Controlling Swelling with Multilayered Compression Wraps Methods: Demonstration, Explain/Verbal Responses: State content correctly Wound/Skin Impairment: Handouts: Caring for Your Ulcer Methods: Demonstration Responses: State content correctly Electronic Signature(s) Signed: 11/30/2019 4:10:47 PM By: Harold Barban Entered By: Harold Barban on 11/30/2019 11:45:26 Jenna [name] ([id number]) -------------------------------------------------------------------------------- Wound Assessment Details Patient Name: Jenna [name] Date of Service: 11/30/2019 10:45 AM Medical Record Number: [id number] Patient Account Number: [id number] Date of Birth/Sex: 09/14/1931 (85 y.o. F) Treating RN: Army Melia Primary  Medium (34-66%) N/A N/A Granulation Quality: Pale N/A N/A Necrotic Amount: Medium (34-66%) N/A N/A Exposed Structures: Fat Layer (Subcutaneous N/A N/A Tissue) Exposed: Yes Fascia: No Tendon: No Jenna [name], Jenna [name] ([id number]) Muscle: No Joint: No Bone: No Epithelialization: Medium (34-66%) N/A N/A Treatment Notes Wound #3 (Left, Medial Lower Leg) Notes Hblue, ABD, 3-Layer Left, unna to anchor Electronic Signature(s) Signed: 11/30/2019 4:48:24 PM By: Linton Ham MD Entered By: Linton Ham on 11/30/2019 12:27:47 Jenna [name] ([id number]) -------------------------------------------------------------------------------- [place] Details Patient Name: Jenna [name] Date of Service: 11/30/2019 10:45 AM Medical Record Number: [id number] Patient Account Number: [id number] Date of Birth/Sex: May 11, 1931 (85 y.o. F) Treating RN: Harold Barban Primary Care Dawne Casali: Lujean Amel Other Clinician: Referring Aliza Moret: Lujean Amel Treating Asah Lamay/Extender: Tito Dine in Treatment: 4 Active Inactive Necrotic Tissue Nursing Diagnoses: Impaired tissue integrity related to necrotic/devitalized tissue Goals: Necrotic/devitalized tissue will be minimized in the wound bed Date Initiated: 11/02/2019 Target Resolution Date: 11/09/2019 Goal Status: Active Interventions: Assess patient pain level  pre-, during and post procedure and prior to discharge Treatment Activities: Apply topical anesthetic as ordered : 11/02/2019 Notes: Orientation to the Wound Care Program Nursing Diagnoses: Knowledge deficit related to the wound healing center program Goals: Patient/caregiver will verbalize understanding of the [place] Date Initiated: 11/02/2019 Target Resolution Date: 11/09/2019 Goal Status: Active Interventions: Provide education on orientation to the wound center Notes: Venous Leg Ulcer Nursing Diagnoses: Potential for venous Insuffiency (use before diagnosis confirmed) Goals: Non-invasive venous studies are completed as ordered Date Initiated: 11/02/2019 Target Resolution Date: 11/09/2019 Goal Status: Active Jenna [name], Jenna [name] ([id number]) Interventions: Assess peripheral edema status every visit. Provide education on venous insufficiency Treatment Activities: Therapeutic compression applied : 11/02/2019 Notes: Wound/Skin Impairment Nursing Diagnoses: Impaired tissue integrity Goals: Ulcer/skin breakdown will have a volume reduction of 30% by week 4 Date Initiated: 11/02/2019 Target Resolution Date: 11/30/2019 Goal Status: Active Interventions: Assess patient/caregiver ability to obtain necessary supplies Notes: Electronic Signature(s) Signed: 11/30/2019 4:10:47 PM By: Harold Barban Entered By: Harold Barban on 11/30/2019 11:44:32 Jenna [name] ([id number]) -------------------------------------------------------------------------------- Pain Assessment Details Patient Name: Jenna [name] Date of Service: 11/30/2019 10:45 AM Medical Record Number: [id number] Patient Account Number: [id number] Date of Birth/Sex: 11-Jul-1931 (85 y.o. F) Treating RN: Army Melia Primary Care Zac Torti: Lujean Amel Other Clinician: Referring Rubin Dais: Lujean Amel Treating Joyanna Kleman/Extender: Tito Dine in Treatment: 4 Active  Problems Location of Pain Severity and Description of Pain Patient Has Paino No Site Locations Pain Management and Medication Current Pain Management: Electronic Signature(s) Signed: 11/30/2019 11:52:14 AM By: Army Melia Entered By: Army Melia on 11/30/2019 10:55:51 Jenna [name] ([id number]) -------------------------------------------------------------------------------- Patient/Caregiver Education Details Patient Name: Jenna [name] Date of Service: 11/30/2019 10:45 AM Medical Record Number: [id number] Patient Account Number: [id number] Date of Birth/Gender: [birth year] (85 y.o. F) Treating RN: Harold Barban Primary Care Physician: Lujean Amel Other Clinician: Referring Physician: Lujean Amel Treating Physician/Extender: Tito Dine in Treatment: 4 Education Assessment Education Provided To: Patient Education Topics Provided Venous: Handouts: Controlling Swelling with Multilayered Compression Wraps Methods: Demonstration, Explain/Verbal Responses: State content correctly Wound/Skin Impairment: Handouts: Caring for Your Ulcer Methods: Demonstration Responses: State content correctly Electronic Signature(s) Signed: 11/30/2019 4:10:47 PM By: Harold Barban Entered By: Harold Barban on 11/30/2019 11:45:26 Jenna [name] ([id number]) -------------------------------------------------------------------------------- Wound Assessment Details Patient Name: Jenna [name] Date of Service: 11/30/2019 10:45 AM Medical Record Number: [id number] Patient Account Number: [id number] Date of Birth/Sex: 09/14/1931 (85 y.o. F) Treating RN: Army Melia Primary  Jenna [name], Jenna [name] ([id number]) Visit Report for 11/30/2019 Arrival Information Details Patient Name: Jenna [name], Jenna [name]. Date of Service: 11/30/2019 10:45 AM Medical Record Number: [id number] Patient Account Number: [id number] Date of Birth/Sex: 1931/05/24 (85 y.o. F) Treating RN: Army Melia Primary Care Melvinia Ashby: Lujean Amel Other Clinician: Referring Axten Pascucci: Lujean Amel Treating Jameika Kinn/Extender: Tito Dine in Treatment: 4 Visit Information History Since Last Visit Added or deleted any medications: No Patient Arrived: Ambulatory Any new allergies or adverse reactions: No Arrival Time: 10:55 Had a fall or experienced change in No Accompanied By: self activities of daily living that may affect Transfer Assistance: None risk of falls: Patient Identification Verified: Yes Signs or symptoms of abuse/neglect since last visito No Hospitalized since last visit: No Has Dressing in Place as Prescribed: Yes Pain Present Now: No Electronic Signature(s) Signed: 11/30/2019 11:52:14 AM By: Army Melia Entered By: Army Melia on 11/30/2019 10:55:46 Jenna [name] ([id number]) -------------------------------------------------------------------------------- Clinic Level of Care Assessment Details Patient Name: Jenna [name] Date of Service: 11/30/2019 10:45 AM Medical Record Number: [id number] Patient Account Number: [id number] Date of Birth/Sex: July 27, 1931 (85 y.o. F) Treating RN: Harold Barban Primary Care Kala Gassmann: Lujean Amel Other Clinician: Referring Haneefah Venturini: Lujean Amel Treating Damiano Stamper/Extender: Tito Dine in Treatment: 4 Clinic Level of Care Assessment Items TOOL 4 Quantity Score []  - Use when only an EandM is performed on FOLLOW-UP visit 0 ASSESSMENTS - Nursing Assessment / Reassessment X - Reassessment of Co-morbidities (includes updates in patient status) 1 10 X- 1 5 Reassessment of Adherence to Treatment Plan ASSESSMENTS -  Wound and Skin Assessment / Reassessment X - Simple Wound Assessment / Reassessment - one wound 1 5 []  - 0 Complex Wound Assessment / Reassessment - multiple wounds []  - 0 Dermatologic / Skin Assessment (not related to wound area) ASSESSMENTS - Focused Assessment []  - Circumferential Edema Measurements - multi extremities 0 []  - 0 Nutritional Assessment / Counseling / Intervention []  - 0 Lower Extremity Assessment (monofilament, tuning fork, pulses) []  - 0 Peripheral Arterial Disease Assessment (using hand held doppler) ASSESSMENTS - Ostomy and/or Continence Assessment and Care []  - Incontinence Assessment and Management 0 []  - 0 Ostomy Care Assessment and Management (repouching, etc.) PROCESS - Coordination of Care X - Simple Patient / Family Education for ongoing care 1 15 []  - 0 Complex (extensive) Patient / Family Education for ongoing care []  - 0 Staff obtains Programmer, systems, Records, Test Results / Process Orders []  - 0 Staff telephones HHA, Nursing Homes / Clarify orders / etc []  - 0 Routine Transfer to another Facility (non-emergent condition) []  - 0 Routine Hospital Admission (non-emergent condition) []  - 0 New Admissions / Biomedical engineer / Ordering NPWT, Apligraf, etc. []  - 0 Emergency Hospital Admission (emergent condition) X- 1 10 Simple Discharge Coordination Jenna [name], Jenna [name] ([id number]) []  - 0 Complex (extensive) Discharge Coordination PROCESS - Special Needs []  - Pediatric / Minor Patient Management 0 []  - 0 Isolation Patient Management []  - 0 Hearing / Language / Visual special needs []  - 0 Assessment of Community assistance (transportation, D/C planning, etc.) []  - 0 Additional assistance / Altered mentation []  - 0 Support Surface(s) Assessment (bed, cushion, seat, etc.) INTERVENTIONS - Wound Cleansing / Measurement X - Simple Wound Cleansing - one wound 1 5 []  - 0 Complex Wound Cleansing - multiple wounds X- 1 5 Wound Imaging  (photographs - any number of wounds) []  - 0 Wound Tracing (instead of photographs) X- 1 5 Simple Wound Measurement - one wound []  -

## 2019-12-07 ENCOUNTER — Encounter: Payer: Medicare Other | Admitting: Internal Medicine

## 2019-12-07 ENCOUNTER — Other Ambulatory Visit: Payer: Self-pay

## 2019-12-07 DIAGNOSIS — E11622 Type 2 diabetes mellitus with other skin ulcer: Secondary | ICD-10-CM | POA: Diagnosis not present

## 2019-12-07 DIAGNOSIS — L97322 Non-pressure chronic ulcer of left ankle with fat layer exposed: Secondary | ICD-10-CM | POA: Diagnosis not present

## 2019-12-08 NOTE — Progress Notes (Signed)
Jenna, [name] ([id number]) Visit Report for 12/07/2019 HPI Details Patient Name: Jenna [name], Jenna [name]. Date of Service: 12/07/2019 10:15 AM Medical Record Number: [id number] Patient Account Number: [id number] Date of Birth/Sex: November 01, 1931 (85 y.o. F) Treating RN: Cornell Barman Primary Care Provider: Lujean Amel Other Clinician: Referring Provider: Lujean Amel Treating Provider/Extender: Tito Dine in Treatment: 5 History of Present Illness Location: left lower extremity in the medial ankle region Quality: Patient reports experiencing a dull pain to affected area(s). Severity: Patient states wound are getting worse. Duration: Patient has had the wound for > 3 months prior to seeking treatment at the wound center Timing: Pain in wound is Intermittent (comes and goes Context: The wound appeared gradually over time Modifying Factors: Other treatment(s) tried include:[name] Associated Signs and Symptoms: Patient reports having increase swelling. HPI Description: 85 year old patient with a past medical history significant for venous stasis ulceration and diabetes mellitus was recently admitted to the hospital between 07/07/2016 and 07/09/2016. She was wound to have a nonpurulent cellulitis of the left lower extremity and was started on IV antibiotics which included vancomycin. He is discharged home with her and Unna's boots and oral doxycycline. During this admission there was no obvious DVT involving the left lower extremity. her past medical history significant for diabetes mellitus, hypertension, hyperlipidemia, varicose veins, status post right rotator cuff repair, knee surgery on the left,robotic abdominal hysterectomy, laparoscopic cholecystectomy. She is not a smoker. Venous study done on 07/18/2015 showed normal left extremity deep venous system with no evidence of DVT. There was extensive valvular incompetence and  reflux throughout the left greater saphenous vein with direct the medication to subcutaneous varicose veins. Normal left small saphenous vein. I understand she was seen by vascular radiology group and the workup and recommendation was for endovenous ablation but due to family pressure she was not able to keep her appointment a year ago. She has not been wearing her compression stockings regularly. 05/26/18 READMISSION this is an 85 year old woman who was previously seen in this clinic in 2017 by Dr. Con Memos. She has chronic venous insufficiency with lymphedema and at that time had open area on the left medial calf and ankle area. Was recommended that she wear 20- 30 mm compression stockings and the patient tells me that she has been compliant with this although I think her primary doctor recently told her that she didn't need to wear stockings out of fear it might cause arterial compression. She noticed edema and pain in the area a week to 2 ago. She saw her primary doctor and was given doxycycline and Silvadene cream to put on the area. She states things are a lot better. The patient has a history of type 2 diabetes on oral agents but she is unaware of her hemoglobin A1c for her blood glucose which she doesn't check. She has hypertension, varicose veins, rotator cuff repair, knee surgery on the left, history of a laparoscopic cholecystectomy, lymphedema, obstructive sleep apnea, history of endometrial CA. The patient has been to see vein and vascular in the past and I think has had an ablation of the left greater saphenous vein based on ultrasounds of the left leg IC dating back to 2017. Her most recent ultrasound was in May 2018 which showed no evidence of a DVT and continued durable closure of the treated segment of the left greater saphenous vein. Patent lateral accessory branch of the greater saphenous vein extending to the calf ABIs in our  clinic were 1.05 on the right and 0.95 on the  left 06/02/18 the patient's wound on the left medial lower calf is completely closed and epithelialized. She has new 20-30 mm below- knee stockings Jenna [name], Jenna [name] ([id number]) READMISSION 11/02/2019 This is a now 85 year old woman who has been in this clinic 2 times before. Most recently in 2019. She has chronic venous insufficiency with some degree of secondary lymphedema. She has had a history of a left greater saphenous vein ablation. When she is here in 2019 she had a wound on the left medial lower calf. This closed fairly easily. It was recommended she wear 20/30 mm below-knee stockings she is not compliant with this. She arrives in clinic with a 2-week history of a left medial lower extremity and ankle wound. Some of this has dry slough on it but most of it is already epithelialize she has been using a combination of Vaseline and/or Silvadene. She is not wearing any compression. She has a history of chronic venous reflux. There was recommendations in the past for ablations I do not know that she ever carried through with this. According to notes from 2016 this work-up was done via the interventional radiology group. We will need to research this. She is probably going to need a consultation ABIs in our clinic were 1.03 on the right and 0.93 on the left 11/11; patient's wound looks as though it is epithelializing horizontal wound on the left medial lower extremity. She has a superior satellite lesion. She is complaining of a lot of pain in 3 layer compression. There have been recommendations for previous ablations I am not sure if she followed up on this. She has been noncompliant with stockings although she brought those into the clinic today. 11/18; the patient had a repeat venous reflux studies at vein and vascular. This did not show any DVT in the left lower extremity there was no evidence of chronic venous insufficiency and no evidence of superficial vein thrombosis. I looked back at  previous studies done I think in interventional radiology in 2018 would suggest that there had been previous ablation of a segment of the left greater saphenous vein. By review of the new study I do not think the patient needs to see vein and vascular however I find these studies increasingly difficult to interpret. I am not sure that the patient has actually consistently worn compression stockings and I think that is the next step here. The wound is just about closed 11/22/2019 upon evaluation today patient actually appears to be healed based on what I am seeing today. She has been tolerating the dressing changes without complication. Fortunately there is no signs of active infection at this time. Overall I feel like even though this is the first time of seeing her that she has actually done extremely well with the current wound care measures again she has been under the care of Dr. Dellia Nims. 12/2 the patient was expected to be healed this week however she comes in with 3 small wounds that look much the same as the pictures from 2 weeks ago. These are all in the area of the left medial lower extremity. She was put into her own compression stockings last week I do not think the edema control was adequate in this area for healing. We have been using Hydrofera Blue 12/9; wound is fully epithelialized but still looks vulnerable on the left medial lower extremity. Surrounding venous inflammation. I think she has lymphedema with fibrosed skin in  the distal lower leg. [Inverted bottle sign]. She has had venous reflux studies that have not shown any of the superficial veins to be amenable to ablations. This is been repeated during this visit. I am not really convinced that she has been wearing compression stockings although she certainly has them. Electronic Signature(s) Signed: 12/07/2019 6:09:55 PM By: Linton Ham MD Entered By: Linton Ham on 12/07/2019 12:24:51 Jenna [name]  ([id number]) -------------------------------------------------------------------------------- Physical Exam Details Patient Name: Jenna [name], Jenna [name] Date of Service: 12/07/2019 10:15 AM Medical Record Number: [id number] Patient Account Number: [id number] Date of Birth/Sex: 1931/10/02 (85 y.o. F) Treating RN: Cornell Barman Primary Care Provider: Lujean Amel Other Clinician: Referring Provider: Lujean Amel Treating Provider/Extender: Ricard Dillon Weeks in Treatment: 5 Constitutional Sitting or standing Blood Pressure is within target range for patient.. Pulse regular and within target range for patient.Marland Kitchen Respirations regular, non-labored and within target range.. Temperature is normal and within the target range for the patient.Marland Kitchen appears in no distress. Eyes Conjunctivae clear. No discharge. Respiratory Respiratory effort is easy and symmetric bilaterally. Rate is normal at rest and on room air.. Cardiovascular Pedal pulses are palpable.. No major edema. Integumentary (Hair, Skin) No tenderness I do not think there is infection here.Marland Kitchen Psychiatric No evidence of depression, anxiety, or agitation. Calm, cooperative, and communicative. Appropriate interactions and affect.. Notes Wound exam; there is no open area now. However the area on her left medial ankle looks vulnerable. Surrounding skin changes of venous inflammation as well as dry xerotic skin. Electronic Signature(s) Signed: 12/07/2019 6:09:55 PM By: Linton Ham MD Entered By: Linton Ham on 12/07/2019 12:26:21 Jenna [name] ([id number]) -------------------------------------------------------------------------------- Physician Orders Details Patient Name: MERANDA, VONDRA Date of Service: 12/07/2019 10:15 AM Medical Record Number: [id number] Patient Account Number: [id number] Date of Birth/Sex: 10/06/1931 (85 y.o. F) Treating RN: Cornell Barman Primary Care Provider: Lujean Amel Other Clinician: Referring  Provider: Lujean Amel Treating Provider/Extender: Tito Dine in Treatment: 5 Verbal / Phone Orders: No Diagnosis Coding Wound Cleansing Wound #3 Left,Medial Lower Leg o Clean wound with Normal Saline. o May shower with protection. Anesthetic (add to Medication List) Wound #3 Left,Medial Lower Leg o Topical Lidocaine 4% cream applied to wound bed prior to debridement (In Clinic Only). Skin Barriers/Peri-Wound Care Wound #3 Left,Medial Lower Leg o Barrier cream o Moisturizing lotion Primary Wound Dressing Wound #3 Left,Medial Lower Leg o Non-adherent pad Dressing Change Frequency Wound #3 Left,Medial Lower Leg o Change dressing every week o Other: - Nurse Visit if wrap slides down Follow-up Appointments Wound #3 Left,Medial Lower Leg o Return Appointment in 1 week. o Nurse Visit as needed Edema Control Wound #3 Left,Medial Lower Leg o 3 Layer Compression System - Left Lower Extremity Additional Orders / Instructions Wound #3 Left,Medial Lower Leg o Increase protein intake. Notes Patient to bring in stockings next week. Electronic Signature(s) Jenna [name], Jenna [name] ([id number]) Signed: 12/07/2019 5:38:26 PM By: Gretta Cool [name], RN, CWS, [name], [name] Signed: 12/07/2019 6:09:55 PM By: Linton Ham MD Entered By: Gretta Cool, [name], RN, CWS, Kim on 12/07/2019 10:30:36 Jenna [name], Jenna [name] ([id number]) -------------------------------------------------------------------------------- Problem List Details Patient Name: Jenna [name], FU. Date of Service: 12/07/2019 10:15 AM Medical Record Number: [id number] Patient Account Number: [id number] Date of Birth/Sex: May 15, 1931 (85 y.o. F) Treating RN: Cornell Barman Primary Care Provider: Lujean Amel Other Clinician: Referring Provider: Lujean Amel Treating Provider/Extender: Tito Dine in Treatment: 5 Active Problems ICD-10 Evaluated Encounter Code Description Active Date Today  Diagnosis L97.221 Non-pressure chronic ulcer of left calf  limited to breakdown of 11/02/2019 No Yes skin I87.333 Chronic venous hypertension (idiopathic) with ulcer and 11/16/2019 No Yes inflammation of bilateral lower extremity Inactive Problems Resolved Problems Electronic Signature(s) Signed: 12/07/2019 6:09:55 PM By: Linton Ham MD Entered By: Linton Ham on 12/07/2019 12:22:58 Jenna [name] ([id number]) -------------------------------------------------------------------------------- Progress Note Details Patient Name: Jenna [name] Date of Service: 12/07/2019 10:15 AM Medical Record Number: [id number] Patient Account Number: [id number] Date of Birth/Sex: [birth year] (85 y.o. F) Treating RN: Cornell Barman Primary Care Provider: Lujean Amel Other Clinician: Referring Provider: Lujean Amel Treating Provider/Extender: Tito Dine in Treatment: 5 Subjective History of Present Illness (HPI) The following HPI elements were documented for the patient's wound: Location: left lower extremity in the medial ankle region Quality: Patient reports experiencing a dull pain to affected area(s). Severity: Patient states wound are getting worse. Duration: Patient has had the wound for > 3 months prior to seeking treatment at the wound center Timing: Pain in wound is Intermittent (comes and goes Context: The wound appeared gradually over time Modifying Factors: Other treatment(s) tried include:[name] Associated Signs and Symptoms: Patient reports having increase swelling. 85 year old patient with a past medical history significant for venous stasis ulceration and diabetes mellitus was recently admitted to the hospital between 07/07/2016 and 07/09/2016. She was wound to have a nonpurulent cellulitis of the left lower extremity and was started on IV antibiotics which included vancomycin. He is discharged home with her  and Unna's boots and oral doxycycline. During this admission there was no obvious DVT involving the left lower extremity. her past medical history significant for diabetes mellitus, hypertension, hyperlipidemia, varicose veins, status post right rotator cuff repair, knee surgery on the left,robotic abdominal hysterectomy, laparoscopic cholecystectomy. She is not a smoker. Venous study done on 07/18/2015 showed normal left extremity deep venous system with no evidence of DVT. There was extensive valvular incompetence and reflux throughout the left greater saphenous vein with direct the medication to subcutaneous varicose veins. Normal left small saphenous vein. I understand she was seen by vascular radiology group and the workup and recommendation was for endovenous ablation but due to family pressure she was not able to keep her appointment a year ago. She has not been wearing her compression stockings regularly. 05/26/18 READMISSION this is an 85 year old woman who was previously seen in this clinic in 2017 by Dr. Con Memos. She has chronic venous insufficiency with lymphedema and at that time had open area on the left medial calf and ankle area. Was recommended that she wear 20- 30 mm compression stockings and the patient tells me that she has been compliant with this although I think her primary doctor recently told her that she didn't need to wear stockings out of fear it might cause arterial compression. She noticed edema and pain in the area a week to 2 ago. She saw her primary doctor and was given doxycycline and Silvadene cream to put on the area. She states things are a lot better. The patient has a history of type 2 diabetes on oral agents but she is unaware of her hemoglobin A1c for her blood glucose which she doesn't check. She has hypertension, varicose veins, rotator cuff repair, knee surgery on the left, history of a laparoscopic cholecystectomy, lymphedema, obstructive sleep apnea,  history of endometrial CA. The patient has been to see vein and vascular in the past and I think has had an ablation of the left greater saphenous vein based on ultrasounds  of the left leg IC dating back to 2017. Her most recent ultrasound was in May 2018 which showed no evidence of a DVT and continued durable closure of the treated segment of the left greater saphenous vein. Patent lateral accessory branch of the greater saphenous vein extending to the calf ABIs in our clinic were 1.05 on the right and 0.95 on the left 06/02/18 the patient's wound on the left medial lower calf is completely closed and epithelialized. She has new 20-30 mm below- knee stockings Jenna [name], Jenna [name] ([id number]) READMISSION 11/02/2019 This is a now 85 year old woman who has been in this clinic 2 times before. Most recently in 2019. She has chronic venous insufficiency with some degree of secondary lymphedema. She has had a history of a left greater saphenous vein ablation. When she is here in 2019 she had a wound on the left medial lower calf. This closed fairly easily. It was recommended she wear 20/30 mm below-knee stockings she is not compliant with this. She arrives in clinic with a 2-week history of a left medial lower extremity and ankle wound. Some of this has dry slough on it but most of it is already epithelialize she has been using a combination of Vaseline and/or Silvadene. She is not wearing any compression. She has a history of chronic venous reflux. There was recommendations in the past for ablations I do not know that she ever carried through with this. According to notes from 2016 this work-up was done via the interventional radiology group. We will need to research this. She is probably going to need a consultation ABIs in our clinic were 1.03 on the right and 0.93 on the left 11/11; patient's wound looks as though it is epithelializing horizontal wound on the left medial lower extremity. She has  a superior satellite lesion. She is complaining of a lot of pain in 3 layer compression. There have been recommendations for previous ablations I am not sure if she followed up on this. She has been noncompliant with stockings although she brought those into the clinic today. 11/18; the patient had a repeat venous reflux studies at vein and vascular. This did not show any DVT in the left lower extremity there was no evidence of chronic venous insufficiency and no evidence of superficial vein thrombosis. I looked back at previous studies done I think in interventional radiology in 2018 would suggest that there had been previous ablation of a segment of the left greater saphenous vein. By review of the new study I do not think the patient needs to see vein and vascular however I find these studies increasingly difficult to interpret. I am not sure that the patient has actually consistently worn compression stockings and I think that is the next step here. The wound is just about closed 11/22/2019 upon evaluation today patient actually appears to be healed based on what I am seeing today. She has been tolerating the dressing changes without complication. Fortunately there is no signs of active infection at this time. Overall I feel like even though this is the first time of seeing her that she has actually done extremely well with the current wound care measures again she has been under the care of Dr. Dellia Nims. 12/2 the patient was expected to be healed this week however she comes in with 3 small wounds that look much the same as the pictures from 2 weeks ago. These are all in the area of the left medial lower extremity. She was put into  her own compression stockings last week I do not think the edema control was adequate in this area for healing. We have been using Hydrofera Blue 12/9; wound is fully epithelialized but still looks vulnerable on the left medial lower extremity. Surrounding  venous inflammation. I think she has lymphedema with fibrosed skin in the distal lower leg. [Inverted bottle sign]. She has had venous reflux studies that have not shown any of the superficial veins to be amenable to ablations. This is been repeated during this visit. I am not really convinced that she has been wearing compression stockings although she certainly has them. Objective Constitutional Sitting or standing Blood Pressure is within target range for patient.. Pulse regular and within target range for patient.Marland Kitchen Respirations regular, non-labored and within target range.. Temperature is normal and within the target range for the patient.Marland Kitchen appears in no distress. Vitals Time Taken: 10:14 AM, Height: 61 in, Weight: 220 lbs, BMI: 41.6, Temperature: 98.2 F, Pulse: 69 bpm, Respiratory Rate: 16 breaths/min, Blood Pressure: 140/70 mmHg. Jenna [name], Jenna [name] ([id number]) Eyes Conjunctivae clear. No discharge. Respiratory Respiratory effort is easy and symmetric bilaterally. Rate is normal at rest and on room air.. Cardiovascular Pedal pulses are palpable.. No major edema. Psychiatric No evidence of depression, anxiety, or agitation. Calm, cooperative, and communicative. Appropriate interactions and affect.. General Notes: Wound exam; there is no open area now. However the area on her left medial ankle looks vulnerable. Surrounding skin changes of venous inflammation as well as dry xerotic skin. Integumentary (Hair, Skin) No tenderness I do not think there is infection here.. Wound #3 status is Open. Original cause of wound was Gradually Appeared. The wound is located on the Left,Medial Lower Leg. The wound measures 0.5cm length x 0.3cm width x 0.1cm depth; 0.118cm^2 area and 0.012cm^3 volume. There is Fat Layer (Subcutaneous Tissue) Exposed exposed. There is no tunneling or undermining noted. There is a small amount of serous drainage noted. The wound margin is flat and intact. There is large  (67-100%) pale granulation within the wound bed. There is a small (1-33%) amount of necrotic tissue within the wound bed including Eschar and Adherent Slough. Assessment Active Problems ICD-10 Non-pressure chronic ulcer of left calf limited to breakdown of skin Chronic venous hypertension (idiopathic) with ulcer and inflammation of bilateral lower extremity Procedures Wound #3 Pre-procedure diagnosis of Wound #3 is a Diabetic Wound/Ulcer of the Lower Extremity located on the Left,Medial Lower Leg . There was a Three Layer Compression Therapy Procedure with a pre-treatment ABI of 1 by Cornell Barman, RN. Post procedure Diagnosis Wound #3: Same as Pre-Procedure Plan Wound Cleansing: Wound #3 Left,Medial Lower Leg: Jenna [name], Jenna [name]. ([id number]) Clean wound with Normal Saline. May shower with protection. Anesthetic (add to Medication List): Wound #3 Left,Medial Lower Leg: Topical Lidocaine 4% cream applied to wound bed prior to debridement (In Clinic Only). Skin Barriers/Peri-Wound Care: Wound #3 Left,Medial Lower Leg: Barrier cream Moisturizing lotion Primary Wound Dressing: Wound #3 Left,Medial Lower Leg: Non-adherent pad Dressing Change Frequency: Wound #3 Left,Medial Lower Leg: Change dressing every week Other: - Nurse Visit if wrap slides down Follow-up Appointments: Wound #3 Left,Medial Lower Leg: Return Appointment in 1 week. Nurse Visit as needed Edema Control: Wound #3 Left,Medial Lower Leg: 3 Layer Compression System - Left Lower Extremity Additional Orders / Instructions: Wound #3 Left,Medial Lower Leg: Increase protein intake. General Notes: Patient to bring in stockings next week. Wound exam; the area is fully epithelialized. 2. We applied TCA and moisturizer 3. Nonadherent padding I put  her back in 3 layer compression 4. We gave her her measurements for her legs. We either want new compression stockings for the bilateral legs or if she has them at home and they  fit we can use those as well. Without compression I think this area would be an extreme risk of breaking down in short order Electronic Signature(s) Signed: 12/07/2019 6:09:55 PM By: Linton Ham MD Entered By: Linton Ham on 12/07/2019 12:27:45 Jenna [name] ([id number]) -------------------------------------------------------------------------------- SuperBill Details Patient Name: Jenna [name] Date of Service: 12/07/2019 Medical Record Number: [id number] Patient Account Number: [id number] Date of Birth/Sex: 1931-01-12 (85 y.o. F) Treating RN: Cornell Barman Primary Care Provider: Lujean Amel Other Clinician: Referring Provider: Lujean Amel Treating Provider/Extender: Tito Dine in Treatment: 5 Diagnosis Coding ICD-10 Codes Code Description [phone] Non-pressure chronic ulcer of left calf limited to breakdown of skin I87.333 Chronic venous hypertension (idiopathic) with ulcer and inflammation of bilateral lower extremity Facility Procedures CPT4 Code: [id number] Description: (Facility Use Only) [phone] - [facility] [id number] LWR LT LEG Modifier: Quantity: 1 Physician Procedures CPT4: Description Modifier Quantity Code [id number] - WC PHYS LEVEL 3 - EST PT 1 ICD-10 Diagnosis Description L97.221 Non-pressure chronic ulcer of left calf limited to breakdown of skin I87.333 Chronic venous hypertension (idiopathic) with ulcer and  inflammation of bilateral lower extremity Electronic Signature(s) Signed: 12/07/2019 5:31:04 PM By: Gretta Cool, [name], RN, CWS, [name], [name] Signed: 12/07/2019 6:09:55 PM By: Linton Ham MD Entered By: Gretta Cool, [name], RN, CWS, Kim on 12/07/2019 17:31:04

## 2019-12-08 NOTE — Progress Notes (Signed)
Etiology: Diabetic Wound/Ulcer of the N/A N/A Lower Extremity Comorbid History: Cataracts, Glaucoma, N/A N/A Lymphedema, Hypertension, Type II Diabetes, Osteoarthritis Date Acquired: 08/30/2019 N/A N/A Weeks of Treatment: 5 N/A N/A Wound Status: Open N/A N/A Measurements L x W x D 0.5x0.3x0.1 N/A N/A (cm) Area (cm) : 0.118 N/A N/A Volume (cm) : 0.012 N/A N/A % Reduction in Area: 95.60% N/A N/A % Reduction in Volume: 95.50% N/A N/A Classification: Grade 2 N/A N/A Exudate Amount: Small N/A N/A Exudate Type: Serous N/A N/A Exudate Color: amber N/A N/A Wound Margin: Flat and Intact N/A N/A Granulation Amount: Large (67-100%) N/A N/A Granulation Quality: Pale N/A N/A Necrotic Amount: Small (1-33%) N/A N/A Necrotic Tissue: Eschar, Adherent Slough N/A N/A Exposed Structures: Fat Layer (Subcutaneous N/A N/A Tissue) Exposed: Yes Fascia: No Jenna [name], Jenna [name] ([id number]) Tendon: No Muscle: No Joint: No Bone: No Epithelialization: Medium (34-66%) N/A N/A Procedures Performed: Compression Therapy N/A N/A Treatment Notes Wound #3 (Left, Medial Lower Leg) Notes non adh pad, 3-Layer Left, unna to anchor Electronic  Signature(s) Signed: 12/07/2019 6:09:55 PM By: Linton Ham MD Entered By: Linton Ham on 12/07/2019 12:23:13 Jenna [name] ([id number]) -------------------------------------------------------------------------------- Multi-Disciplinary Care Plan Details Patient Name: Jenna [name] Date of Service: 12/07/2019 10:15 AM Medical Record Number: [id number] Patient Account Number: [id number] Date of Birth/Sex: 12/25/1931 (85 y.o. F) Treating RN: Cornell Barman Primary Care Louie Meaders: Lujean Amel Other Clinician: Referring Claretha Townshend: Lujean Amel Treating Dallie Patton/Extender: Tito Dine in Treatment: 5 Active Inactive Necrotic Tissue Nursing Diagnoses: Impaired tissue integrity related to necrotic/devitalized tissue Goals: Necrotic/devitalized tissue will be minimized in the wound bed Date Initiated: 11/02/2019 Target Resolution Date: 11/09/2019 Goal Status: Active Interventions: Assess patient pain level pre-, during and post procedure and prior to discharge Treatment Activities: Apply topical anesthetic as ordered : 11/02/2019 Notes: Orientation to the Wound Care Program Nursing Diagnoses: Knowledge deficit related to the wound healing center program Goals: Patient/caregiver will verbalize understanding of the [place] Date Initiated: 11/02/2019 Target Resolution Date: 11/09/2019 Goal Status: Active Interventions: Provide education on orientation to the wound center Notes: Venous Leg Ulcer Nursing Diagnoses: Potential for venous Insuffiency (use before diagnosis confirmed) Goals: Non-invasive venous studies are completed as ordered Date Initiated: 11/02/2019 Target Resolution Date: 11/09/2019 Goal Status: Active Jenna [name], Jenna [name] ([id number]) Interventions: Assess peripheral edema status every visit. Provide education on venous insufficiency Treatment Activities: Therapeutic compression applied : 11/02/2019 Notes: Wound/Skin  Impairment Nursing Diagnoses: Impaired tissue integrity Goals: Ulcer/skin breakdown will have a volume reduction of 30% by week 4 Date Initiated: 11/02/2019 Target Resolution Date: 11/30/2019 Goal Status: Active Interventions: Assess patient/caregiver ability to obtain necessary supplies Notes: Electronic Signature(s) Signed: 12/07/2019 5:38:26 PM By: Gretta Cool, [name], RN, CWS, [name], [name] Entered By: Gretta Cool, [name], RN, CWS, Kim on 12/07/2019 10:28:05 Jenna [name] ([id number]) -------------------------------------------------------------------------------- Pain Assessment Details Patient Name: Jenna [name] Date of Service: 12/07/2019 10:15 AM Medical Record Number: [id number] Patient Account Number: [id number] Date of Birth/Sex: August 10, 1931 (85 y.o. F) Treating RN: Army Melia Primary Care Jayvion Stefanski: Lujean Amel Other Clinician: Referring Maurion Walkowiak: Lujean Amel Treating Imre Vecchione/Extender: Tito Dine in Treatment: 5 Active Problems Location of Pain Severity and Description of Pain Patient Has Paino No Site Locations Pain Management and Medication Current Pain Management: Electronic Signature(s) Signed: 12/07/2019 11:39:50 AM By: Army Melia Entered By: Army Melia on 12/07/2019 10:14:49 Jenna [name] ([id number]) -------------------------------------------------------------------------------- Patient/Caregiver Education Details Patient Name: Jenna [name] Date of Service: 12/07/2019 10:15 AM Medical Record Number: [id number] Patient Account Number:  Etiology: Diabetic Wound/Ulcer of the N/A N/A Lower Extremity Comorbid History: Cataracts, Glaucoma, N/A N/A Lymphedema, Hypertension, Type II Diabetes, Osteoarthritis Date Acquired: 08/30/2019 N/A N/A Weeks of Treatment: 5 N/A N/A Wound Status: Open N/A N/A Measurements L x W x D 0.5x0.3x0.1 N/A N/A (cm) Area (cm) : 0.118 N/A N/A Volume (cm) : 0.012 N/A N/A % Reduction in Area: 95.60% N/A N/A % Reduction in Volume: 95.50% N/A N/A Classification: Grade 2 N/A N/A Exudate Amount: Small N/A N/A Exudate Type: Serous N/A N/A Exudate Color: amber N/A N/A Wound Margin: Flat and Intact N/A N/A Granulation Amount: Large (67-100%) N/A N/A Granulation Quality: Pale N/A N/A Necrotic Amount: Small (1-33%) N/A N/A Necrotic Tissue: Eschar, Adherent Slough N/A N/A Exposed Structures: Fat Layer (Subcutaneous N/A N/A Tissue) Exposed: Yes Fascia: No Jenna [name], Jenna [name] ([id number]) Tendon: No Muscle: No Joint: No Bone: No Epithelialization: Medium (34-66%) N/A N/A Procedures Performed: Compression Therapy N/A N/A Treatment Notes Wound #3 (Left, Medial Lower Leg) Notes non adh pad, 3-Layer Left, unna to anchor Electronic  Signature(s) Signed: 12/07/2019 6:09:55 PM By: Linton Ham MD Entered By: Linton Ham on 12/07/2019 12:23:13 Jenna [name] ([id number]) -------------------------------------------------------------------------------- Multi-Disciplinary Care Plan Details Patient Name: Jenna [name] Date of Service: 12/07/2019 10:15 AM Medical Record Number: [id number] Patient Account Number: [id number] Date of Birth/Sex: 12/25/1931 (85 y.o. F) Treating RN: Cornell Barman Primary Care Louie Meaders: Lujean Amel Other Clinician: Referring Claretha Townshend: Lujean Amel Treating Dallie Patton/Extender: Tito Dine in Treatment: 5 Active Inactive Necrotic Tissue Nursing Diagnoses: Impaired tissue integrity related to necrotic/devitalized tissue Goals: Necrotic/devitalized tissue will be minimized in the wound bed Date Initiated: 11/02/2019 Target Resolution Date: 11/09/2019 Goal Status: Active Interventions: Assess patient pain level pre-, during and post procedure and prior to discharge Treatment Activities: Apply topical anesthetic as ordered : 11/02/2019 Notes: Orientation to the Wound Care Program Nursing Diagnoses: Knowledge deficit related to the wound healing center program Goals: Patient/caregiver will verbalize understanding of the [place] Date Initiated: 11/02/2019 Target Resolution Date: 11/09/2019 Goal Status: Active Interventions: Provide education on orientation to the wound center Notes: Venous Leg Ulcer Nursing Diagnoses: Potential for venous Insuffiency (use before diagnosis confirmed) Goals: Non-invasive venous studies are completed as ordered Date Initiated: 11/02/2019 Target Resolution Date: 11/09/2019 Goal Status: Active Jenna [name], Jenna [name] ([id number]) Interventions: Assess peripheral edema status every visit. Provide education on venous insufficiency Treatment Activities: Therapeutic compression applied : 11/02/2019 Notes: Wound/Skin  Impairment Nursing Diagnoses: Impaired tissue integrity Goals: Ulcer/skin breakdown will have a volume reduction of 30% by week 4 Date Initiated: 11/02/2019 Target Resolution Date: 11/30/2019 Goal Status: Active Interventions: Assess patient/caregiver ability to obtain necessary supplies Notes: Electronic Signature(s) Signed: 12/07/2019 5:38:26 PM By: Gretta Cool, [name], RN, CWS, [name], [name] Entered By: Gretta Cool, [name], RN, CWS, Kim on 12/07/2019 10:28:05 Jenna [name] ([id number]) -------------------------------------------------------------------------------- Pain Assessment Details Patient Name: Jenna [name] Date of Service: 12/07/2019 10:15 AM Medical Record Number: [id number] Patient Account Number: [id number] Date of Birth/Sex: August 10, 1931 (85 y.o. F) Treating RN: Army Melia Primary Care Jayvion Stefanski: Lujean Amel Other Clinician: Referring Maurion Walkowiak: Lujean Amel Treating Imre Vecchione/Extender: Tito Dine in Treatment: 5 Active Problems Location of Pain Severity and Description of Pain Patient Has Paino No Site Locations Pain Management and Medication Current Pain Management: Electronic Signature(s) Signed: 12/07/2019 11:39:50 AM By: Army Melia Entered By: Army Melia on 12/07/2019 10:14:49 Jenna [name] ([id number]) -------------------------------------------------------------------------------- Patient/Caregiver Education Details Patient Name: Jenna [name] Date of Service: 12/07/2019 10:15 AM Medical Record Number: [id number] Patient Account Number:  Jenna [name], Jenna [name] ([id number]) Visit Report for 12/07/2019 Arrival Information Details Patient Name: Jenna [name], Jenna [name]. Date of Service: 12/07/2019 10:15 AM Medical Record Number: [id number] Patient Account Number: [id number] Date of Birth/Sex: October 26, 1931 (85 y.o. F) Treating RN: Army Melia Primary Care Lajune Perine: Lujean Amel Other Clinician: Referring Grover Woodfield: Lujean Amel Treating Thelda Gagan/Extender: Tito Dine in Treatment: 5 Visit Information History Since Last Visit Added or deleted any medications: No Patient Arrived: Cane Any new allergies or adverse reactions: No Arrival Time: 10:14 Had a fall or experienced change in No Accompanied By: self activities of daily living that may affect Transfer Assistance: None risk of falls: Patient Identification Verified: Yes Signs or symptoms of abuse/neglect since last visito No Hospitalized since last visit: No Has Dressing in Place as Prescribed: Yes Pain Present Now: No Electronic Signature(s) Signed: 12/07/2019 11:39:50 AM By: Army Melia Entered By: Army Melia on 12/07/2019 10:14:34 Jenna [name] ([id number]) -------------------------------------------------------------------------------- Compression Therapy Details Patient Name: Jenna [name] Date of Service: 12/07/2019 10:15 AM Medical Record Number: [id number] Patient Account Number: [id number] Date of Birth/Sex: 12-13-1931 (85 y.o. F) Treating RN: Cornell Barman Primary Care Claudia Alvizo: Lujean Amel Other Clinician: Referring Brinson Tozzi: Lujean Amel Treating Olis Viverette/Extender: Tito Dine in Treatment: 5 Compression Therapy Performed for Wound Assessment: Wound #3 Left,Medial Lower Leg Performed By: Clinician Cornell Barman, RN Compression Type: Three Layer Pre Treatment ABI: 1 Post Procedure Diagnosis Same as Pre-procedure Electronic Signature(s) Signed: 12/07/2019 5:38:26 PM By: Gretta Cool, [name], RN, CWS, [name], [name] Entered By: Gretta Cool, [name],  RN, CWS, Kim on 12/07/2019 10:28:52 Jenna [name] ([id number]) -------------------------------------------------------------------------------- Encounter Discharge Information Details Patient Name: Jenna [name], Jenna [name]. Date of Service: 12/07/2019 10:15 AM Medical Record Number: [id number] Patient Account Number: [id number] Date of Birth/Sex: 1931/03/21 (85 y.o. F) Treating RN: Cornell Barman Primary Care Fintan Grater: Lujean Amel Other Clinician: Referring Claudy Abdallah: Lujean Amel Treating Shubham Thackston/Extender: Tito Dine in Treatment: 5 Encounter Discharge Information Items Discharge Condition: Stable Ambulatory Status: Ambulatory Discharge Destination: Home Transportation: Private Auto Accompanied By: self Schedule Follow-up Appointment: Yes Clinical Summary of Care: Electronic Signature(s) Signed: 12/07/2019 5:38:26 PM By: Gretta Cool, [name], RN, CWS, [name], [name] Entered By: Gretta Cool, [name], RN, CWS, Kim on 12/07/2019 10:45:22 Jenna [name] ([id number]) -------------------------------------------------------------------------------- Lower Extremity Assessment Details Patient Name: Jenna [name], Jenna [name] Date of Service: 12/07/2019 10:15 AM Medical Record Number: [id number] Patient Account Number: [id number] Date of Birth/Sex: 1931-08-31 (85 y.o. F) Treating RN: Army Melia Primary Care Anjannette Gauger: Lujean Amel Other Clinician: Referring Shade Rivenbark: Lujean Amel Treating Keeana Pieratt/Extender: Ricard Dillon Weeks in Treatment: 5 Edema Assessment Assessed: [Left: No] [Right: No] Edema: [Left: N] [Right: o] Calf Left: Right: Point of Measurement: 28 cm From Medial Instep 45 cm cm Ankle Left: Right: Point of Measurement: 10 cm From Medial Instep 24 cm cm Vascular Assessment Pulses: Dorsalis Pedis Palpable: [Left:Yes] Electronic Signature(s) Signed: 12/07/2019 11:39:50 AM By: Army Melia Entered By: Army Melia on 12/07/2019 10:23:20 Jenna [name]  ([id number]) -------------------------------------------------------------------------------- Multi Wound Chart Details Patient Name: Jenna [name] Date of Service: 12/07/2019 10:15 AM Medical Record Number: [id number] Patient Account Number: [id number] Date of Birth/Sex: 09-Aug-1931 (85 y.o. F) Treating RN: Cornell Barman Primary Care Clare Casto: Lujean Amel Other Clinician: Referring Chett Taniguchi: Lujean Amel Treating Tamsen Reist/Extender: Tito Dine in Treatment: 5 Vital Signs Height(in): 61 Pulse(bpm): 69 Weight(lbs): 220 Blood Pressure(mmHg): 140/70 Body Mass Index(BMI): 42 Temperature(F): 98.2 Respiratory Rate 16 (breaths/min): Photos: [N/A:N/A] Wound Location: Left Lower Leg - Medial N/A N/A Wounding Event: Gradually Appeared N/A N/A Primary

## 2019-12-09 DIAGNOSIS — H43813 Vitreous degeneration, bilateral: Secondary | ICD-10-CM | POA: Diagnosis not present

## 2019-12-09 DIAGNOSIS — E119 Type 2 diabetes mellitus without complications: Secondary | ICD-10-CM | POA: Diagnosis not present

## 2019-12-09 DIAGNOSIS — H401132 Primary open-angle glaucoma, bilateral, moderate stage: Secondary | ICD-10-CM | POA: Diagnosis not present

## 2019-12-09 DIAGNOSIS — H25811 Combined forms of age-related cataract, right eye: Secondary | ICD-10-CM | POA: Diagnosis not present

## 2019-12-14 ENCOUNTER — Encounter: Payer: Medicare Other | Admitting: Internal Medicine

## 2019-12-21 ENCOUNTER — Encounter: Payer: Medicare Other | Admitting: Internal Medicine

## 2019-12-21 ENCOUNTER — Other Ambulatory Visit: Payer: Self-pay

## 2019-12-21 DIAGNOSIS — E11622 Type 2 diabetes mellitus with other skin ulcer: Secondary | ICD-10-CM | POA: Diagnosis not present

## 2020-01-04 NOTE — Progress Notes (Signed)
RN, CWS, [name], [name] Entered By: Gretta Cool, [name], RN, CWS, Kim on 12/21/2019 10:55:38 Jenna [name] ([id number]) -------------------------------------------------------------------------------- Vitals Details Patient Name: Jenna [name] Date of Service: 12/21/2019 10:30 AM Medical Record Number: [id number] Patient Account Number: [id number] Date of Birth/Sex: March 22, 1931 (86 y.o. F) Treating RN: Army Melia Primary Care Deidrea Gaetz: Lujean Amel Other Clinician: Referring Saphira Lahmann: Lujean Amel Treating Alysse Rathe/Extender: Tito Dine in Treatment: 7 Vital Signs Time Taken:  10:36 Temperature (F): 98.3 Height (in): 61 Pulse (bpm): 69 Weight (lbs): 220 Respiratory Rate (breaths/min): 16 Body Mass Index (BMI): 41.6 Blood Pressure (mmHg): 167/71 Reference Range: 80 - 120 mg / dl Electronic Signature(s) Signed: 12/21/2019 11:52:26 AM By: Army Melia Entered By: Army Melia on 12/21/2019 10:36:27  RN, CWS, [name], [name] Entered By: Gretta Cool, [name], RN, CWS, Kim on 12/21/2019 10:55:38 Jenna [name] ([id number]) -------------------------------------------------------------------------------- Vitals Details Patient Name: Jenna [name] Date of Service: 12/21/2019 10:30 AM Medical Record Number: [id number] Patient Account Number: [id number] Date of Birth/Sex: March 22, 1931 (86 y.o. F) Treating RN: Army Melia Primary Care Deidrea Gaetz: Lujean Amel Other Clinician: Referring Saphira Lahmann: Lujean Amel Treating Alysse Rathe/Extender: Tito Dine in Treatment: 7 Vital Signs Time Taken:  10:36 Temperature (F): 98.3 Height (in): 61 Pulse (bpm): 69 Weight (lbs): 220 Respiratory Rate (breaths/min): 16 Body Mass Index (BMI): 41.6 Blood Pressure (mmHg): 167/71 Reference Range: 80 - 120 mg / dl Electronic Signature(s) Signed: 12/21/2019 11:52:26 AM By: Army Melia Entered By: Army Melia on 12/21/2019 10:36:27  Jenna [name], Jenna [name] ([id number]) Visit Report for 12/21/2019 Arrival Information Details Patient Name: Jenna [name], Jenna [name]. Date of Service: 12/21/2019 10:30 AM Medical Record Number: [id number] Patient Account Number: [id number] Date of Birth/Sex: 19-May-1931 (86 y.o. F) Treating RN: Army Melia Primary Care Yahye Siebert: Lujean Amel Other Clinician: Referring Allee Busk: Lujean Amel Treating Rasheida Broden/Extender: Tito Dine in Treatment: 7 Visit Information History Since Last Visit Added or deleted any medications: No Patient Arrived: Ambulatory Any new allergies or adverse reactions: No Arrival Time: 10:35 Had a fall or experienced change in No Accompanied By: self activities of daily living that may affect Transfer Assistance: None risk of falls: Patient Identification Verified: Yes Signs or symptoms of abuse/neglect since last visito No Hospitalized since last visit: No Has Dressing in Place as Prescribed: Yes Pain Present Now: No Electronic Signature(s) Signed: 12/21/2019 11:52:26 AM By: Army Melia Entered By: Army Melia on 12/21/2019 10:35:42 Jenna [name] ([id number]) -------------------------------------------------------------------------------- Clinic Level of Care Assessment Details Patient Name: Jenna [name] Date of Service: 12/21/2019 10:30 AM Medical Record Number: [id number] Patient Account Number: [id number] Date of Birth/Sex: Dec 01, 1931 (86 y.o. F) Treating RN: Cornell Barman Primary Care Dontrae Morini: Lujean Amel Other Clinician: Referring Shira Bobst: Lujean Amel Treating Luria Rosario/Extender: Tito Dine in Treatment: 7 Clinic Level of Care Assessment Items TOOL 4 Quantity Score []  - Use when only an EandM is performed on FOLLOW-UP visit 0 ASSESSMENTS - Nursing Assessment / Reassessment []  - Reassessment of Co-morbidities (includes updates in patient status) 0 X- 1 5 Reassessment of Adherence to Treatment Plan ASSESSMENTS -  Wound and Skin Assessment / Reassessment X - Simple Wound Assessment / Reassessment - one wound 1 5 []  - 0 Complex Wound Assessment / Reassessment - multiple wounds []  - 0 Dermatologic / Skin Assessment (not related to wound area) ASSESSMENTS - Focused Assessment []  - Circumferential Edema Measurements - multi extremities 0 []  - 0 Nutritional Assessment / Counseling / Intervention []  - 0 Lower Extremity Assessment (monofilament, tuning fork, pulses) []  - 0 Peripheral Arterial Disease Assessment (using hand held doppler) ASSESSMENTS - Ostomy and/or Continence Assessment and Care []  - Incontinence Assessment and Management 0 []  - 0 Ostomy Care Assessment and Management (repouching, etc.) PROCESS - Coordination of Care X - Simple Patient / Family Education for ongoing care 1 15 []  - 0 Complex (extensive) Patient / Family Education for ongoing care []  - 0 Staff obtains Programmer, systems, Records, Test Results / Process Orders []  - 0 Staff telephones HHA, Nursing Homes / Clarify orders / etc []  - 0 Routine Transfer to another Facility (non-emergent condition) []  - 0 Routine Hospital Admission (non-emergent condition) []  - 0 New Admissions / Biomedical engineer / Ordering NPWT, Apligraf, etc. []  - 0 Emergency Hospital Admission (emergent condition) X- 1 10 Simple Discharge Coordination Jenna [name], Jenna [name] ([id number]) []  - 0 Complex (extensive) Discharge Coordination PROCESS - Special Needs []  - Pediatric / Minor Patient Management 0 []  - 0 Isolation Patient Management []  - 0 Hearing / Language / Visual special needs []  - 0 Assessment of Community assistance (transportation, D/C planning, etc.) []  - 0 Additional assistance / Altered mentation []  - 0 Support Surface(s) Assessment (bed, cushion, seat, etc.) INTERVENTIONS - Wound Cleansing / Measurement []  - Simple Wound Cleansing - one wound 0 []  - 0 Complex Wound Cleansing - multiple wounds X- 1 5 Wound Imaging (photographs  - any number of wounds) []  - 0 Wound Tracing (instead of photographs) []  - 0 Simple Wound Measurement - one wound []  - 0  N/A Exudate Color: amber N/A N/A Wound Margin: Flat and Intact N/A N/A Granulation Amount: Large (67-100%) N/A N/A Granulation Quality: Pale N/A N/A Necrotic Amount: Small (1-33%) N/A N/A Necrotic Tissue: Eschar, Adherent Slough N/A N/A Exposed Structures: Fat Layer (Subcutaneous N/A N/A Tissue) Exposed: Yes Fascia: No Jenna [name], Jenna [name] ([id number]) Tendon: No Muscle: No Joint: No Bone: No Epithelialization: Medium (34-66%) N/A N/A Treatment Notes Electronic Signature(s) Signed: 12/21/2019 5:48:31 PM By: Linton Ham MD Entered By: Linton Ham on 12/21/2019 12:22:03 Jenna [name] ([id number]) -------------------------------------------------------------------------------- Multi-Disciplinary Care Plan Details Patient Name: Jenna [name] Date of Service: 12/21/2019 10:30 AM Medical Record Number: [id number] Patient Account Number: [id number] Date of Birth/Sex: December 24, 1931 (86 y.o. F) Treating RN: Cornell Barman Primary Care Abelino Tippin: Lujean Amel Other Clinician: Referring Jorah Hua: Lujean Amel Treating Peri Kreft/Extender: Tito Dine in Treatment: 7 Active Inactive Electronic Signature(s) Signed: 01/04/2020 4:51:46 PM By: Gretta Cool, [name], RN, CWS, [name], [name] Entered By: Gretta Cool, [name], RN, CWS, Kim on 12/21/2019 10:55:53 Jenna [name] ([id number]) -------------------------------------------------------------------------------- Pain Assessment Details Patient  Name: Jenna [name] Date of Service: 12/21/2019 10:30 AM Medical Record Number: [id number] Patient Account Number: [id number] Date of Birth/Sex: 04-24-1931 (86 y.o. F) Treating RN: Army Melia Primary Care Jamilia Jacques: Lujean Amel Other Clinician: Referring Cristabel Bicknell: Lujean Amel Treating Devanny Palecek/Extender: Tito Dine in Treatment: 7 Active Problems Location of Pain Severity and Description of Pain Patient Has Paino No Site Locations Pain Management and Medication Current Pain Management: Electronic Signature(s) Signed: 12/21/2019 11:52:26 AM By: Army Melia Entered By: Army Melia on 12/21/2019 10:35:57 Jenna [name] ([id number]) -------------------------------------------------------------------------------- Patient/Caregiver Education Details Patient Name: Jenna [name] Date of Service: 12/21/2019 10:30 AM Medical Record Number: [id number] Patient Account Number: [id number] Date of Birth/Gender: April 04, 1931 (86 y.o. F) Treating RN: Cornell Barman Primary Care Physician: Lujean Amel Other Clinician: Referring Physician: Lujean Amel Treating Physician/Extender: Tito Dine in Treatment: 7 Education Assessment Education Provided To: Patient Education Topics Provided Wound/Skin Impairment: Handouts: Caring for Your Ulcer Methods: Demonstration, Explain/Verbal Responses: State content correctly Electronic Signature(s) Signed: 01/04/2020 4:51:46 PM By: Gretta Cool, [name], RN, CWS, [name], [name] Entered By: Gretta Cool, [name], RN, CWS, Kim on 12/21/2019 10:57:55 Jenna [name] ([id number]) -------------------------------------------------------------------------------- Wound Assessment Details Patient Name: Jenna [name] Date of Service: 12/21/2019 10:30 AM Medical Record Number: [id number] Patient Account Number: [id number] Date of Birth/Sex: 19-Aug-1931 (86 y.o. F) Treating RN: Cornell Barman Primary Care Tan Clopper: Lujean Amel Other  Clinician: Referring Rohini Jaroszewski: Lujean Amel Treating Raenah Murley/Extender: Tito Dine in Treatment: 7 Wound Status Wound Number: 3 Primary Diabetic Wound/Ulcer of the Lower Extremity Etiology: Wound Location: Left, Medial Lower Leg Wound Healed - Epithelialized Wounding Event: Gradually Appeared Status: Date Acquired: 08/30/2019 Comorbid Cataracts, Glaucoma, Lymphedema, Weeks Of Treatment: 7 History: Hypertension, Type II Diabetes, Osteoarthritis Clustered Wound: No Photos Wound Measurements Length: (cm) 0 % Reduc Width: (cm) 0 % Reduc Depth: (cm) 0 Epithel Area: (cm) 0 Tunnel Volume: (cm) 0 Underm tion in Area: 100% tion in Volume: 100% ialization: Medium (34-66%) ing: No ining: No Wound Description Classification: Grade 2 Wound Margin: Flat and Intact Exudate Amount: Small Exudate Type: Serous Exudate Color: amber Foul Odor After Cleansing: No Slough/Fibrino Yes Wound Bed Granulation Amount: Large (67-100%) Exposed Structure Granulation Quality: Pale Fascia Exposed: No Necrotic Amount: Small (1-33%) Fat Layer (Subcutaneous Tissue) Exposed: Yes Necrotic Quality: Eschar, Adherent Slough Tendon Exposed: No Muscle Exposed: No Joint Exposed: No Bone Exposed: No Electronic Signature(s) Jenna [name], Jenna [name] ([id number]) Signed: 01/04/2020 4:51:46 PM By: Gretta Cool, [name],

## 2020-01-04 NOTE — Progress Notes (Signed)
ROQUEL, CARNATHAN ([id number]) Visit Report for 12/21/2019 HPI Details Patient Name: Jenna [name], Jenna [name]. Date of Service: 12/21/2019 10:30 AM Medical Record Number: [id number] Patient Account Number: [id number] Date of Birth/Sex: 1931-09-24 (86 y.o. F) Treating RN: Cornell Barman Primary Care Provider: Lujean Amel Other Clinician: Referring Provider: Lujean Amel Treating Provider/Extender: Tito Dine in Treatment: 7 History of Present Illness Location: left lower extremity in the medial ankle region Quality: Patient reports experiencing a dull pain to affected area(s). Severity: Patient states wound are getting worse. Duration: Patient has had the wound for > 3 months prior to seeking treatment at the wound center Timing: Pain in wound is Intermittent (comes and goes Context: The wound appeared gradually over time Modifying Factors: Other treatment(s) tried include:[name] Associated Signs and Symptoms: Patient reports having increase swelling. HPI Description: 86 year old patient with a past medical history significant for venous stasis ulceration and diabetes mellitus was recently admitted to the hospital between 07/07/2016 and 07/09/2016. She was wound to have a nonpurulent cellulitis of the left lower extremity and was started on IV antibiotics which included vancomycin. He is discharged home with her and Unna's boots and oral doxycycline. During this admission there was no obvious DVT involving the left lower extremity. her past medical history significant for diabetes mellitus, hypertension, hyperlipidemia, varicose veins, status post right rotator cuff repair, knee surgery on the left,robotic abdominal hysterectomy, laparoscopic cholecystectomy. She is not a smoker. Venous study done on 07/18/2015 showed normal left extremity deep venous system with no evidence of DVT. There was extensive valvular incompetence and  reflux throughout the left greater saphenous vein with direct the medication to subcutaneous varicose veins. Normal left small saphenous vein. I understand she was seen by vascular radiology group and the workup and recommendation was for endovenous ablation but due to family pressure she was not able to keep her appointment a year ago. She has not been wearing her compression stockings regularly. 05/26/18 READMISSION this is an 86 year old woman who was previously seen in this clinic in 2017 by Dr. Con Memos. She has chronic venous insufficiency with lymphedema and at that time had open area on the left medial calf and ankle area. Was recommended that she wear 20- 30 mm compression stockings and the patient tells me that she has been compliant with this although I think her primary doctor recently told her that she didn't need to wear stockings out of fear it might cause arterial compression. She noticed edema and pain in the area a week to 2 ago. She saw her primary doctor and was given doxycycline and Silvadene cream to put on the area. She states things are a lot better. The patient has a history of type 2 diabetes on oral agents but she is unaware of her hemoglobin A1c for her blood glucose which she doesn't check. She has hypertension, varicose veins, rotator cuff repair, knee surgery on the left, history of a laparoscopic cholecystectomy, lymphedema, obstructive sleep apnea, history of endometrial CA. The patient has been to see vein and vascular in the past and I think has had an ablation of the left greater saphenous vein based on ultrasounds of the left leg IC dating back to 2017. Her most recent ultrasound was in May 2018 which showed no evidence of a DVT and continued durable closure of the treated segment of the left greater saphenous vein. Patent lateral accessory branch of the greater saphenous vein extending to the calf ABIs in our  clinic were 1.05 on the right and 0.95 on the  left 06/02/18 the patient's wound on the left medial lower calf is completely closed and epithelialized. She has new 20-30 mm below- knee stockings Jenna [name], Jenna [name] ([id number]) READMISSION 11/02/2019 This is a now 86 year old woman who has been in this clinic 2 times before. Most recently in 2019. She has chronic venous insufficiency with some degree of secondary lymphedema. She has had a history of a left greater saphenous vein ablation. When she is here in 2019 she had a wound on the left medial lower calf. This closed fairly easily. It was recommended she wear 20/30 mm below-knee stockings she is not compliant with this. She arrives in clinic with a 2-week history of a left medial lower extremity and ankle wound. Some of this has dry slough on it but most of it is already epithelialize she has been using a combination of Vaseline and/or Silvadene. She is not wearing any compression. She has a history of chronic venous reflux. There was recommendations in the past for ablations I do not know that she ever carried through with this. According to notes from 2016 this work-up was done via the interventional radiology group. We will need to research this. She is probably going to need a consultation ABIs in our clinic were 1.03 on the right and 0.93 on the left 11/11; patient's wound looks as though it is epithelializing horizontal wound on the left medial lower extremity. She has a superior satellite lesion. She is complaining of a lot of pain in 3 layer compression. There have been recommendations for previous ablations I am not sure if she followed up on this. She has been noncompliant with stockings although she brought those into the clinic today. 11/18; the patient had a repeat venous reflux studies at vein and vascular. This did not show any DVT in the left lower extremity there was no evidence of chronic venous insufficiency and no evidence of superficial vein thrombosis. I looked back at  previous studies done I think in interventional radiology in 2018 would suggest that there had been previous ablation of a segment of the left greater saphenous vein. By review of the new study I do not think the patient needs to see vein and vascular however I find these studies increasingly difficult to interpret. I am not sure that the patient has actually consistently worn compression stockings and I think that is the next step here. The wound is just about closed 11/22/2019 upon evaluation today patient actually appears to be healed based on what I am seeing today. She has been tolerating the dressing changes without complication. Fortunately there is no signs of active infection at this time. Overall I feel like even though this is the first time of seeing her that she has actually done extremely well with the current wound care measures again she has been under the care of Dr. Dellia Nims. 12/2 the patient was expected to be healed this week however she comes in with 3 small wounds that look much the same as the pictures from 2 weeks ago. These are all in the area of the left medial lower extremity. She was put into her own compression stockings last week I do not think the edema control was adequate in this area for healing. We have been using Hydrofera Blue 12/9; wound is fully epithelialized but still looks vulnerable on the left medial lower extremity. Surrounding venous inflammation. I think she has lymphedema with fibrosed skin in  the distal lower leg. [Inverted bottle sign]. She has had venous reflux studies that have not shown any of the superficial veins to be amenable to ablations. This is been repeated during this visit. I am not really convinced that she has been wearing compression stockings although she certainly has them. 12/23; patient's wound on the left medial lower extremity is totally healed. She has surrounding venous inflammation and skin damage related to there is also some  degree of lymphedema and fibrosed skin in the distal lower extremity. She has her compression stocking Electronic Signature(s) Signed: 12/22/2019 9:01:59 AM By: Gretta Cool, [name], RN, CWS, [name], [name] Signed: 12/23/2019 12:23:37 PM By: Linton Ham MD Previous Signature: 12/21/2019 5:48:31 PM Version By: Linton Ham MD Entered By: Gretta Cool, [name], RN, CWS, Kim on 12/22/2019 09:01:58 Jenna [name], Jenna [name] ([id number]) -------------------------------------------------------------------------------- Physical Exam Details Patient Name: Jenna [name], Jenna [name]. Date of Service: 12/21/2019 10:30 AM Medical Record Number: [id number] Patient Account Number: [id number] Date of Birth/Sex: Jan 20, 1931 (86 y.o. F) Treating RN: Cornell Barman Primary Care Provider: Lujean Amel Other Clinician: Referring Provider: Lujean Amel Treating Provider/Extender: Tito Dine in Treatment: 7 Constitutional Patient is hypertensive.. Pulse regular and within target range for patient.Marland Kitchen Respirations regular, non-labored and within target range.. Temperature is normal and within the target range for the patient.Marland Kitchen appears in no distress. Eyes Conjunctivae clear. No discharge. Respiratory Respiratory effort is easy and symmetric bilaterally. Rate is normal at rest and on room air.. Cardiovascular Pedal pulses are palpable. Edema present in both extremities. This is nonpitting and likely to be secondary lymphedema. Psychiatric No evidence of depression, anxiety, or agitation. Calm, cooperative, and communicative. Appropriate interactions and affect.. Notes Wound exam; there is no open area now. However the area on the left medial ankle looks vulnerable. Electronic Signature(s) Signed: 12/21/2019 5:48:31 PM By: Linton Ham MD Entered By: Linton Ham on 12/21/2019 12:25:50 Jenna [name] ([id number]) -------------------------------------------------------------------------------- Physician Orders  Details Patient Name: Jenna [name] Date of Service: 12/21/2019 10:30 AM Medical Record Number: [id number] Patient Account Number: [id number] Date of Birth/Sex: February 25, 1931 (86 y.o. F) Treating RN: Cornell Barman Primary Care Provider: Lujean Amel Other Clinician: Referring Provider: Lujean Amel Treating Provider/Extender: Tito Dine in Treatment: 7 Verbal / Phone Orders: No Diagnosis Coding Edema Control o Patient to wear own compression stockings - Wear Compression stockings daily Discharge From [facility] Services o Discharge from [place] Complete Electronic Signature(s) Signed: 12/21/2019 5:48:31 PM By: Linton Ham MD Signed: 01/04/2020 4:51:46 PM By: Gretta Cool, [name], RN, CWS, [name], [name] Entered By: Gretta Cool, [name], RN, CWS, Kim on 12/21/2019 10:57:12 Jenna [name] ([id number]) -------------------------------------------------------------------------------- Problem List Details Patient Name: SANYLAH, VACCHIANO. Date of Service: 12/21/2019 10:30 AM Medical Record Number: [id number] Patient Account Number: [id number] Date of Birth/Sex: Aug 10, 1931 (86 y.o. F) Treating RN: Cornell Barman Primary Care Provider: Lujean Amel Other Clinician: Referring Provider: Lujean Amel Treating Provider/Extender: Tito Dine in Treatment: 7 Active Problems ICD-10 Evaluated Encounter Code Description Active Date Today Diagnosis L97.221 Non-pressure chronic ulcer of left calf limited to breakdown of 11/02/2019 No Yes skin I87.333 Chronic venous hypertension (idiopathic) with ulcer and 11/16/2019 No Yes inflammation of bilateral lower extremity Inactive Problems Resolved Problems Electronic Signature(s) Signed: 12/21/2019 5:48:31 PM By: Linton Ham MD Entered By: Linton Ham on 12/21/2019 12:21:53 Jenna [name] ([id number]) -------------------------------------------------------------------------------- Progress Note Details Patient  Name: Jenna [name] Date of Service: 12/21/2019 10:30 AM Medical Record Number: [id number] Patient Account Number: [id number] Date of Birth/Sex: [birth year] (86  y.o. F) Treating RN: Cornell Barman Primary Care Provider: Lujean Amel Other Clinician: Referring Provider: Lujean Amel Treating Provider/Extender: Tito Dine in Treatment: 7 Subjective History of Present Illness (HPI) The following HPI elements were documented for the patient's wound: Location: left lower extremity in the medial ankle region Quality: Patient reports experiencing a dull pain to affected area(s). Severity: Patient states wound are getting worse. Duration: Patient has had the wound for > 3 months prior to seeking treatment at the wound center Timing: Pain in wound is Intermittent (comes and goes Context: The wound appeared gradually over time Modifying Factors: Other treatment(s) tried include:[name] Associated Signs and Symptoms: Patient reports having increase swelling. 86 year old patient with a past medical history significant for venous stasis ulceration and diabetes mellitus was recently admitted to the hospital between 07/07/2016 and 07/09/2016. She was wound to have a nonpurulent cellulitis of the left lower extremity and was started on IV antibiotics which included vancomycin. He is discharged home with her and Unna's boots and oral doxycycline. During this admission there was no obvious DVT involving the left lower extremity. her past medical history significant for diabetes mellitus, hypertension, hyperlipidemia, varicose veins, status post right rotator cuff repair, knee surgery on the left,robotic abdominal hysterectomy, laparoscopic cholecystectomy. She is not a smoker. Venous study done on 07/18/2015 showed normal left extremity deep venous system with no evidence of DVT. There was extensive valvular incompetence and reflux  throughout the left greater saphenous vein with direct the medication to subcutaneous varicose veins. Normal left small saphenous vein. I understand she was seen by vascular radiology group and the workup and recommendation was for endovenous ablation but due to family pressure she was not able to keep her appointment a year ago. She has not been wearing her compression stockings regularly. 05/26/18 READMISSION this is an 86 year old woman who was previously seen in this clinic in 2017 by Dr. Con Memos. She has chronic venous insufficiency with lymphedema and at that time had open area on the left medial calf and ankle area. Was recommended that she wear 20- 30 mm compression stockings and the patient tells me that she has been compliant with this although I think her primary doctor recently told her that she didn't need to wear stockings out of fear it might cause arterial compression. She noticed edema and pain in the area a week to 2 ago. She saw her primary doctor and was given doxycycline and Silvadene cream to put on the area. She states things are a lot better. The patient has a history of type 2 diabetes on oral agents but she is unaware of her hemoglobin A1c for her blood glucose which she doesn't check. She has hypertension, varicose veins, rotator cuff repair, knee surgery on the left, history of a laparoscopic cholecystectomy, lymphedema, obstructive sleep apnea, history of endometrial CA. The patient has been to see vein and vascular in the past and I think has had an ablation of the left greater saphenous vein based on ultrasounds of the left leg IC dating back to 2017. Her most recent ultrasound was in May 2018 which showed no evidence of a DVT and continued durable closure of the treated segment of the left greater saphenous vein. Patent lateral accessory branch of the greater saphenous vein extending to the calf ABIs in our clinic were 1.05 on the right and 0.95 on the left 06/02/18  the patient's wound on the left medial lower calf is completely  closed and epithelialized. She has new 20-30 mm below- knee stockings Jenna [name], Jenna [name] ([id number]) READMISSION 11/02/2019 This is a now 86 year old woman who has been in this clinic 2 times before. Most recently in 2019. She has chronic venous insufficiency with some degree of secondary lymphedema. She has had a history of a left greater saphenous vein ablation. When she is here in 2019 she had a wound on the left medial lower calf. This closed fairly easily. It was recommended she wear 20/30 mm below-knee stockings she is not compliant with this. She arrives in clinic with a 2-week history of a left medial lower extremity and ankle wound. Some of this has dry slough on it but most of it is already epithelialize she has been using a combination of Vaseline and/or Silvadene. She is not wearing any compression. She has a history of chronic venous reflux. There was recommendations in the past for ablations I do not know that she ever carried through with this. According to notes from 2016 this work-up was done via the interventional radiology group. We will need to research this. She is probably going to need a consultation ABIs in our clinic were 1.03 on the right and 0.93 on the left 11/11; patient's wound looks as though it is epithelializing horizontal wound on the left medial lower extremity. She has a superior satellite lesion. She is complaining of a lot of pain in 3 layer compression. There have been recommendations for previous ablations I am not sure if she followed up on this. She has been noncompliant with stockings although she brought those into the clinic today. 11/18; the patient had a repeat venous reflux studies at vein and vascular. This did not show any DVT in the left lower extremity there was no evidence of chronic venous insufficiency and no evidence of superficial vein thrombosis. I looked back at previous  studies done I think in interventional radiology in 2018 would suggest that there had been previous ablation of a segment of the left greater saphenous vein. By review of the new study I do not think the patient needs to see vein and vascular however I find these studies increasingly difficult to interpret. I am not sure that the patient has actually consistently worn compression stockings and I think that is the next step here. The wound is just about closed 11/22/2019 upon evaluation today patient actually appears to be healed based on what I am seeing today. She has been tolerating the dressing changes without complication. Fortunately there is no signs of active infection at this time. Overall I feel like even though this is the first time of seeing her that she has actually done extremely well with the current wound care measures again she has been under the care of Dr. Dellia Nims. 12/2 the patient was expected to be healed this week however she comes in with 3 small wounds that look much the same as the pictures from 2 weeks ago. These are all in the area of the left medial lower extremity. She was put into her own compression stockings last week I do not think the edema control was adequate in this area for healing. We have been using Hydrofera Blue 12/9; wound is fully epithelialized but still looks vulnerable on the left medial lower extremity. Surrounding venous inflammation. I think she has lymphedema with fibrosed skin in the distal lower leg. [Inverted bottle sign]. She has had venous reflux studies that have not shown any of the superficial veins to be  amenable to ablations. This is been repeated during this visit. I am not really convinced that she has been wearing compression stockings although she certainly has them. 12/23; patient's wound on the left medial lower extremity is totally healed. She has surrounding venous inflammation and skin damage related to there is also some degree of  lymphedema and fibrosed skin in the distal lower extremity. She has her compression stocking Objective Constitutional Patient is hypertensive.. Pulse regular and within target range for patient.Marland Kitchen Respirations regular, non-labored and within target range.. Temperature is normal and within the target range for the patient.Marland Kitchen appears in no distress. Vitals Time Taken: 10:36 AM, Height: 61 in, Weight: 220 lbs, BMI: 41.6, Temperature: 98.3 F, Pulse: 69 bpm, Respiratory Laible, Angi S. ([id number]) Rate: 16 breaths/min, Blood Pressure: 167/71 mmHg. Eyes Conjunctivae clear. No discharge. Respiratory Respiratory effort is easy and symmetric bilaterally. Rate is normal at rest and on room air.. Cardiovascular Pedal pulses are palpable. Edema present in both extremities. This is nonpitting and likely to be secondary lymphedema. Psychiatric No evidence of depression, anxiety, or agitation. Calm, cooperative, and communicative. Appropriate interactions and affect.. General Notes: Wound exam; there is no open area now. However the area on the left medial ankle looks vulnerable. Integumentary (Hair, Skin) Wound #3 status is Healed - Epithelialized. Original cause of wound was Gradually Appeared. The wound is located on the Left,Medial Lower Leg. The wound measures 0cm length x 0cm width x 0cm depth; 0cm^2 area and 0cm^3 volume. There is Fat Layer (Subcutaneous Tissue) Exposed exposed. There is no tunneling or undermining noted. There is a small amount of serous drainage noted. The wound margin is flat and intact. There is large (67-100%) pale granulation within the wound bed. There is a small (1-33%) amount of necrotic tissue within the wound bed including Eschar and Adherent Slough. Assessment Active Problems ICD-10 Non-pressure chronic ulcer of left calf limited to breakdown of skin Chronic venous hypertension (idiopathic) with ulcer and inflammation of bilateral lower extremity Plan Edema  Control: Patient to wear own compression stockings - Wear Compression stockings daily Discharge From [facility] Services: Discharge from [place] Complete 1. The patient can be discharged to her own compression stockings which I think are 20/30 2. If she returns in short order she may need more aggressive compression Electronic Signature(s) Signed: 01/02/2020 2:09:33 PM By: Gretta Cool, [name], RN, CWS, [name], [name] Jenna [name], Jenna [name] ([id number]) Signed: 01/04/2020 4:45:14 PM By: Linton Ham MD Previous Signature: 12/21/2019 5:48:31 PM Version By: Linton Ham MD Entered By: Gretta Cool, [name], RN, CWS, Kim on 01/02/2020 14:09:32 Jenna [name], Jenna [name] ([id number]) -------------------------------------------------------------------------------- [place] Details Patient Name: Jenna [name], Jenna [name]. Date of Service: 12/21/2019 Medical Record Number: [id number] Patient Account Number: [id number] Date of Birth/Sex: [birth year] (86 y.o. F) Treating RN: Cornell Barman Primary Care Provider: Lujean Amel Other Clinician: Referring Provider: Lujean Amel Treating Provider/Extender: Tito Dine in Treatment: 7 Diagnosis Coding ICD-10 Codes Code Description [phone] Non-pressure chronic ulcer of left calf limited to breakdown of skin I87.333 Chronic venous hypertension (idiopathic) with ulcer and inflammation of bilateral lower extremity Facility Procedures CPT4 Code: [id number] Description: [phone] - WOUND CARE VISIT-LEV 2 EST PT Modifier: Quantity: 1 Physician Procedures CPT4: Description Modifier Quantity Code [id number] - WC PHYS LEVEL 3 - EST PT 1 ICD-10 Diagnosis Description L97.221 Non-pressure chronic ulcer of left calf limited to breakdown of skin I87.333 Chronic venous hypertension (idiopathic) with ulcer and  inflammation of bilateral lower extremity Electronic Signature(s) Signed: 12/21/2019 5:48:31 PM By:  Linton Ham MD Entered By: Linton Ham on 12/21/2019 12:26:46

## 2020-02-08 DIAGNOSIS — Z1239 Encounter for other screening for malignant neoplasm of breast: Secondary | ICD-10-CM | POA: Diagnosis not present

## 2020-02-08 DIAGNOSIS — Z1231 Encounter for screening mammogram for malignant neoplasm of breast: Secondary | ICD-10-CM | POA: Diagnosis not present

## 2020-02-08 DIAGNOSIS — Z1211 Encounter for screening for malignant neoplasm of colon: Secondary | ICD-10-CM | POA: Diagnosis not present

## 2020-02-08 DIAGNOSIS — Z6841 Body Mass Index (BMI) 40.0 and over, adult: Secondary | ICD-10-CM | POA: Diagnosis not present

## 2020-02-08 DIAGNOSIS — Z13820 Encounter for screening for osteoporosis: Secondary | ICD-10-CM | POA: Diagnosis not present

## 2020-02-08 DIAGNOSIS — Z01419 Encounter for gynecological examination (general) (routine) without abnormal findings: Secondary | ICD-10-CM | POA: Diagnosis not present

## 2020-02-10 DIAGNOSIS — H25811 Combined forms of age-related cataract, right eye: Secondary | ICD-10-CM | POA: Diagnosis not present

## 2020-02-10 DIAGNOSIS — H43813 Vitreous degeneration, bilateral: Secondary | ICD-10-CM | POA: Diagnosis not present

## 2020-02-10 DIAGNOSIS — E119 Type 2 diabetes mellitus without complications: Secondary | ICD-10-CM | POA: Diagnosis not present

## 2020-02-10 DIAGNOSIS — H04123 Dry eye syndrome of bilateral lacrimal glands: Secondary | ICD-10-CM | POA: Diagnosis not present

## 2020-02-10 DIAGNOSIS — H401132 Primary open-angle glaucoma, bilateral, moderate stage: Secondary | ICD-10-CM | POA: Diagnosis not present

## 2020-02-12 ENCOUNTER — Ambulatory Visit: Payer: BLUE CROSS/BLUE SHIELD

## 2020-02-12 ENCOUNTER — Ambulatory Visit: Payer: Medicare Other | Attending: Internal Medicine

## 2020-02-12 DIAGNOSIS — Z23 Encounter for immunization: Secondary | ICD-10-CM | POA: Insufficient documentation

## 2020-02-12 NOTE — Progress Notes (Signed)
   Covid-19 Vaccination Clinic  Name:  Jenna [name]    MRN: [id number] DOB: 1931-10-22  02/12/2020  [name] was observed post Covid-19 immunization for 30 minutes based on pre-vaccination screening without incidence. She was provided with Vaccine Information Sheet and instruction to access the V-Safe system.   [name] was instructed to call 911 with any severe reactions post vaccine: Marland Kitchen Difficulty breathing  . Swelling of your face and throat  . A fast heartbeat  . A bad rash all over your body  . Dizziness and weakness    Immunizations Administered    Name Date Dose VIS Date Route   Pfizer COVID-19 Vaccine 02/12/2020 12:02 PM 0.3 mL 12/09/2019 Intramuscular   Manufacturer: [place]   Lot: [id number]   [place]: [id number]

## 2020-03-06 ENCOUNTER — Ambulatory Visit: Payer: Medicare Other | Attending: Internal Medicine

## 2020-03-06 DIAGNOSIS — Z23 Encounter for immunization: Secondary | ICD-10-CM

## 2020-03-06 NOTE — Progress Notes (Signed)
   [facility]  Name:  Jenna [name]    MRN: [id number] DOB: Nov 23, 1931  03/06/2020  [name] was observed post Covid-19 immunization for 15 minutes without incident. She was provided with Vaccine Information Sheet and instruction to access the V-Safe system.   [name] was instructed to call 911 with any severe reactions post vaccine: Marland Kitchen Difficulty breathing  . Swelling of face and throat  . A fast heartbeat  . A bad rash all over body  . Dizziness and weakness   Immunizations Administered    Name Date Dose VIS Date Route   Pfizer COVID-19 Vaccine 03/06/2020  4:59 PM 0.3 mL 12/09/2019 Intramuscular   Manufacturer: [place]   Lot: [id number]   [place]: [id number]

## 2020-03-08 DIAGNOSIS — H25811 Combined forms of age-related cataract, right eye: Secondary | ICD-10-CM | POA: Diagnosis not present

## 2020-03-08 DIAGNOSIS — E119 Type 2 diabetes mellitus without complications: Secondary | ICD-10-CM | POA: Diagnosis not present

## 2020-03-08 DIAGNOSIS — H04123 Dry eye syndrome of bilateral lacrimal glands: Secondary | ICD-10-CM | POA: Diagnosis not present

## 2020-03-08 DIAGNOSIS — H401132 Primary open-angle glaucoma, bilateral, moderate stage: Secondary | ICD-10-CM | POA: Diagnosis not present

## 2020-03-08 DIAGNOSIS — H52223 Regular astigmatism, bilateral: Secondary | ICD-10-CM | POA: Diagnosis not present

## 2020-03-08 DIAGNOSIS — H5213 Myopia, bilateral: Secondary | ICD-10-CM | POA: Diagnosis not present

## 2020-03-08 DIAGNOSIS — H43813 Vitreous degeneration, bilateral: Secondary | ICD-10-CM | POA: Diagnosis not present

## 2020-03-27 DIAGNOSIS — I890 Lymphedema, not elsewhere classified: Secondary | ICD-10-CM | POA: Diagnosis not present

## 2020-03-27 DIAGNOSIS — Z0001 Encounter for general adult medical examination with abnormal findings: Secondary | ICD-10-CM | POA: Diagnosis not present

## 2020-03-27 DIAGNOSIS — E1169 Type 2 diabetes mellitus with other specified complication: Secondary | ICD-10-CM | POA: Diagnosis not present

## 2020-03-27 DIAGNOSIS — E7800 Pure hypercholesterolemia, unspecified: Secondary | ICD-10-CM | POA: Diagnosis not present

## 2020-03-27 DIAGNOSIS — H401132 Primary open-angle glaucoma, bilateral, moderate stage: Secondary | ICD-10-CM | POA: Diagnosis not present

## 2020-03-27 DIAGNOSIS — Z79899 Other long term (current) drug therapy: Secondary | ICD-10-CM | POA: Diagnosis not present

## 2020-03-27 DIAGNOSIS — Z7984 Long term (current) use of oral hypoglycemic drugs: Secondary | ICD-10-CM | POA: Diagnosis not present

## 2020-06-07 DIAGNOSIS — E119 Type 2 diabetes mellitus without complications: Secondary | ICD-10-CM | POA: Diagnosis not present

## 2020-06-07 DIAGNOSIS — H401132 Primary open-angle glaucoma, bilateral, moderate stage: Secondary | ICD-10-CM | POA: Diagnosis not present

## 2020-06-07 DIAGNOSIS — H43813 Vitreous degeneration, bilateral: Secondary | ICD-10-CM | POA: Diagnosis not present

## 2020-06-07 DIAGNOSIS — H25811 Combined forms of age-related cataract, right eye: Secondary | ICD-10-CM | POA: Diagnosis not present

## 2020-06-07 DIAGNOSIS — H04123 Dry eye syndrome of bilateral lacrimal glands: Secondary | ICD-10-CM | POA: Diagnosis not present

## 2020-06-27 DIAGNOSIS — E1169 Type 2 diabetes mellitus with other specified complication: Secondary | ICD-10-CM | POA: Diagnosis not present

## 2020-06-27 DIAGNOSIS — I10 Essential (primary) hypertension: Secondary | ICD-10-CM | POA: Diagnosis not present

## 2020-06-27 DIAGNOSIS — Z79899 Other long term (current) drug therapy: Secondary | ICD-10-CM | POA: Diagnosis not present

## 2020-06-27 DIAGNOSIS — Z7984 Long term (current) use of oral hypoglycemic drugs: Secondary | ICD-10-CM | POA: Diagnosis not present

## 2020-06-27 DIAGNOSIS — E7800 Pure hypercholesterolemia, unspecified: Secondary | ICD-10-CM | POA: Diagnosis not present

## 2020-10-11 DIAGNOSIS — H43813 Vitreous degeneration, bilateral: Secondary | ICD-10-CM | POA: Diagnosis not present

## 2020-10-11 DIAGNOSIS — H401132 Primary open-angle glaucoma, bilateral, moderate stage: Secondary | ICD-10-CM | POA: Diagnosis not present

## 2020-10-11 DIAGNOSIS — H04123 Dry eye syndrome of bilateral lacrimal glands: Secondary | ICD-10-CM | POA: Diagnosis not present

## 2020-10-11 DIAGNOSIS — H25811 Combined forms of age-related cataract, right eye: Secondary | ICD-10-CM | POA: Diagnosis not present

## 2020-10-11 DIAGNOSIS — E119 Type 2 diabetes mellitus without complications: Secondary | ICD-10-CM | POA: Diagnosis not present

## 2020-10-23 DIAGNOSIS — H43813 Vitreous degeneration, bilateral: Secondary | ICD-10-CM | POA: Diagnosis not present

## 2020-10-23 DIAGNOSIS — E119 Type 2 diabetes mellitus without complications: Secondary | ICD-10-CM | POA: Diagnosis not present

## 2020-10-23 DIAGNOSIS — H04123 Dry eye syndrome of bilateral lacrimal glands: Secondary | ICD-10-CM | POA: Diagnosis not present

## 2020-10-23 DIAGNOSIS — H401132 Primary open-angle glaucoma, bilateral, moderate stage: Secondary | ICD-10-CM | POA: Diagnosis not present

## 2020-10-23 DIAGNOSIS — H25811 Combined forms of age-related cataract, right eye: Secondary | ICD-10-CM | POA: Diagnosis not present

## 2020-11-07 DIAGNOSIS — Z79899 Other long term (current) drug therapy: Secondary | ICD-10-CM | POA: Diagnosis not present

## 2020-11-07 DIAGNOSIS — E7800 Pure hypercholesterolemia, unspecified: Secondary | ICD-10-CM | POA: Diagnosis not present

## 2020-11-07 DIAGNOSIS — E1169 Type 2 diabetes mellitus with other specified complication: Secondary | ICD-10-CM | POA: Diagnosis not present

## 2020-11-07 DIAGNOSIS — I83029 Varicose veins of left lower extremity with ulcer of unspecified site: Secondary | ICD-10-CM | POA: Diagnosis not present

## 2020-11-07 DIAGNOSIS — I10 Essential (primary) hypertension: Secondary | ICD-10-CM | POA: Diagnosis not present

## 2020-11-15 DIAGNOSIS — Z23 Encounter for immunization: Secondary | ICD-10-CM | POA: Diagnosis not present

## 2021-01-24 DIAGNOSIS — H401112 Primary open-angle glaucoma, right eye, moderate stage: Secondary | ICD-10-CM | POA: Diagnosis not present

## 2021-01-24 DIAGNOSIS — H401123 Primary open-angle glaucoma, left eye, severe stage: Secondary | ICD-10-CM | POA: Diagnosis not present

## 2021-01-24 DIAGNOSIS — E113299 Type 2 diabetes mellitus with mild nonproliferative diabetic retinopathy without macular edema, unspecified eye: Secondary | ICD-10-CM | POA: Diagnosis not present

## 2021-01-24 DIAGNOSIS — H25811 Combined forms of age-related cataract, right eye: Secondary | ICD-10-CM | POA: Diagnosis not present

## 2021-01-24 DIAGNOSIS — H43813 Vitreous degeneration, bilateral: Secondary | ICD-10-CM | POA: Diagnosis not present

## 2021-01-24 DIAGNOSIS — H04123 Dry eye syndrome of bilateral lacrimal glands: Secondary | ICD-10-CM | POA: Diagnosis not present

## 2021-02-13 DIAGNOSIS — M1990 Unspecified osteoarthritis, unspecified site: Secondary | ICD-10-CM | POA: Diagnosis not present

## 2021-02-13 DIAGNOSIS — Z1239 Encounter for other screening for malignant neoplasm of breast: Secondary | ICD-10-CM | POA: Diagnosis not present

## 2021-02-13 DIAGNOSIS — Z1231 Encounter for screening mammogram for malignant neoplasm of breast: Secondary | ICD-10-CM | POA: Diagnosis not present

## 2021-02-13 DIAGNOSIS — Z6841 Body Mass Index (BMI) 40.0 and over, adult: Secondary | ICD-10-CM | POA: Diagnosis not present

## 2021-02-13 DIAGNOSIS — Z1211 Encounter for screening for malignant neoplasm of colon: Secondary | ICD-10-CM | POA: Diagnosis not present

## 2021-02-13 DIAGNOSIS — Z8542 Personal history of malignant neoplasm of other parts of uterus: Secondary | ICD-10-CM | POA: Diagnosis not present

## 2021-02-13 DIAGNOSIS — M810 Age-related osteoporosis without current pathological fracture: Secondary | ICD-10-CM | POA: Diagnosis not present

## 2021-03-20 ENCOUNTER — Ambulatory Visit: Payer: Medicare Other | Admitting: Internal Medicine

## 2021-03-25 ENCOUNTER — Encounter: Payer: Medicare Other | Attending: Physician Assistant | Admitting: Physician Assistant

## 2021-03-25 ENCOUNTER — Other Ambulatory Visit: Payer: Self-pay

## 2021-03-25 DIAGNOSIS — I890 Lymphedema, not elsewhere classified: Secondary | ICD-10-CM | POA: Diagnosis not present

## 2021-03-25 DIAGNOSIS — L97821 Non-pressure chronic ulcer of other part of left lower leg limited to breakdown of skin: Secondary | ICD-10-CM | POA: Diagnosis not present

## 2021-03-25 DIAGNOSIS — Z836 Family history of other diseases of the respiratory system: Secondary | ICD-10-CM | POA: Insufficient documentation

## 2021-03-25 DIAGNOSIS — I10 Essential (primary) hypertension: Secondary | ICD-10-CM | POA: Insufficient documentation

## 2021-03-25 DIAGNOSIS — Z833 Family history of diabetes mellitus: Secondary | ICD-10-CM | POA: Diagnosis not present

## 2021-03-25 DIAGNOSIS — Z9842 Cataract extraction status, left eye: Secondary | ICD-10-CM | POA: Diagnosis not present

## 2021-03-25 DIAGNOSIS — Z8249 Family history of ischemic heart disease and other diseases of the circulatory system: Secondary | ICD-10-CM | POA: Insufficient documentation

## 2021-03-25 DIAGNOSIS — E11622 Type 2 diabetes mellitus with other skin ulcer: Secondary | ICD-10-CM | POA: Insufficient documentation

## 2021-03-25 DIAGNOSIS — I872 Venous insufficiency (chronic) (peripheral): Secondary | ICD-10-CM | POA: Insufficient documentation

## 2021-03-25 DIAGNOSIS — Z809 Family history of malignant neoplasm, unspecified: Secondary | ICD-10-CM | POA: Insufficient documentation

## 2021-03-26 NOTE — Progress Notes (Signed)
Jenna [name] ([id number]) Visit Report for 03/25/2021 Chief Complaint Document Details Patient Name: Jenna [name], Jenna [name]. Date of Service: 03/25/2021 2:15 PM Medical Record Number: [id number] Patient Account Number: [id number] Date of Birth/Sex: [birth year] (87 y.o. F) Treating RN: Carlene Coria Primary Care Provider: [name], [place]ib[place] Other Clinician: Jeanine Luz Referring Provider: Referral, Self Treating Provider/Extender: Skipper Cliche in Treatment: 0 Information Obtained from: Patient Chief Complaint Left LE Ulcer Electronic Signature(s) Signed: 03/25/2021 2:43:28 PM By: Worthy Keeler PA-C Entered By: Worthy Keeler on 03/25/2021 14:43:27 [place], Jenna [name] ([id number]) -------------------------------------------------------------------------------- HPI Details Patient Name: Jenna [name] Date of Service: 03/25/2021 2:15 PM Medical Record Number: [id number] Patient Account Number: [id number] Date of Birth/Sex: [birth year] (87 y.o. F) Treating RN: Carlene Coria Primary Care Provider: [name], [place]ib[place] Other Clinician: Jeanine Luz Referring Provider: Referral, Self Treating Provider/Extender: Skipper Cliche in Treatment: 0 History of Present Illness Location: left lower extremity in the medial ankle region Quality: Patient reports experiencing a dull pain to affected area(s). Severity: Patient states wound are getting worse. Duration: Patient has had the wound for > 3 months prior to seeking treatment at the wound center Timing: Pain in wound is Intermittent (comes and goes Context: The wound appeared gradually over time Modifying Factors: Other treatment(s) tried include:[name] Associated Signs and Symptoms: Patient reports having increase swelling. HPI Description: 87 year old patient with a past medical history significant for venous stasis ulceration and diabetes mellitus was recently admitted to the  hospital between 07/07/2016 and 07/09/2016. She was wound to have a nonpurulent cellulitis of the left lower extremity and was started on IV antibiotics which included vancomycin. He is discharged home with her and Unna's boots and oral doxycycline. During this admission there was no obvious DVT involving the left lower extremity. her past medical history significant for diabetes mellitus, hypertension, hyperlipidemia, varicose veins, status post right rotator cuff repair, knee surgery on the left,robotic abdominal hysterectomy, laparoscopic cholecystectomy. She is not a smoker. Venous study done on 07/18/2015 showed normal left extremity deep venous system with no evidence of DVT. There was extensive valvular incompetence and reflux throughout the left greater saphenous vein with direct the medication to subcutaneous varicose veins. Normal left small saphenous vein. I understand she was seen by vascular radiology group and the workup and recommendation was for endovenous ablation but due to family pressure she was not able to keep her appointment a year ago. She has not been wearing her compression stockings regularly. 05/26/18 READMISSION this is an 87 year old woman who was previously seen in this clinic in 2017 by Dr. Con Memos. She has chronic venous insufficiency with lymphedema and at that time had open area on the left medial calf and ankle area. Was recommended that she wear 20-30 mm compression stockings and the patient tells me that she has been compliant with this although I think her primary doctor recently told her that she didn't need to wear stockings out of fear it might cause arterial compression. She noticed edema and pain in the area a week to 2 ago. She saw her primary doctor and was given doxycycline and Silvadene cream to put on the area. She states things are a lot better. The patient has a history of type 2 diabetes on oral agents but she is unaware of her hemoglobin A1c for her  blood glucose which she doesn't check. She has hypertension, varicose veins, rotator cuff repair, knee surgery on the left, history of a laparoscopic cholecystectomy, lymphedema,  obstructive sleep apnea, history of endometrial CA. The patient has been to see vein and vascular in the past and I think has had an ablation of the left greater saphenous vein based on ultrasounds of the left leg IC dating back to 2017. Her most recent ultrasound was in May 2018 which showed no evidence of a DVT and continued durable closure of the treated segment of the left greater saphenous vein. Patent lateral accessory branch of the greater saphenous vein extending to the calf ABIs in our clinic were 1.05 on the right and 0.95 on the left 06/02/18 the patient's wound on the left medial lower calf is completely closed and epithelialized. She has new 20-30 mm below-knee stockings READMISSION 11/02/2019 This is a now 87 year old woman who has been in this clinic 2 times before. Most recently in 2019. She has chronic venous insufficiency with some degree of secondary lymphedema. She has had a history of a left greater saphenous vein ablation. When she is here in 2019 she had a wound on the left medial lower calf. This closed fairly easily. It was recommended she wear 20/30 mm below-knee stockings she is not compliant with this. She arrives in clinic with a 2-week history of a left medial lower extremity and ankle wound. Some of this has dry slough on it but most of it is already epithelialize she has been using a combination of Vaseline and/or Silvadene. She is not wearing any compression. She has a history of chronic venous reflux. There was recommendations in the past for ablations I do not know that she ever carried through with this. According to notes from 2016 this work-up was done via the interventional radiology group. We will need to research this. She is probably going to need a consultation ABIs in our clinic  were 1.03 on the right and 0.93 on the left 11/11; patient's wound looks as though it is epithelializing horizontal wound on the left medial lower extremity. She has a superior satellite lesion. She is complaining of a lot of pain in 3 layer compression. There have been recommendations for previous ablations I am not sure if she followed up on this. She has been noncompliant with stockings although she brought those into the clinic today. 11/18; the patient had a repeat venous reflux studies at vein and vascular. This did not show any DVT in the left lower extremity there was no evidence of chronic venous insufficiency and no evidence of superficial vein thrombosis. I looked back at previous studies done I think in interventional radiology in 2018 would suggest that there had been previous ablation of a segment of the left greater saphenous vein. By review of the new study I do not think the patient needs to see vein and vascular however I find these studies increasingly difficult to interpret. I am not sure that the patient has actually consistently worn compression stockings and I think that is the next step here. The wound is just about closed Jenna [name], Jenna [name] ([id number]) 11/22/2019 upon evaluation today patient actually appears to be healed based on what I am seeing today. She has been tolerating the dressing changes without complication. Fortunately there is no signs of active infection at this time. Overall I feel like even though this is the first time of seeing her that she has actually done extremely well with the current wound care measures again she has been under the care of Dr. Dellia Nims. 12/2 the patient was expected to be healed this week however she  comes in with 3 small wounds that look much the same as the pictures from 2 weeks ago. These are all in the area of the left medial lower extremity. She was put into her own compression stockings last week I do not think the edema control  was adequate in this area for healing. We have been using Hydrofera Blue 12/9; wound is fully epithelialized but still looks vulnerable on the left medial lower extremity. Surrounding venous inflammation. I think she has lymphedema with fibrosed skin in the distal lower leg. [Inverted bottle sign]. She has had venous reflux studies that have not shown any of the superficial veins to be amenable to ablations. This is been repeated during this visit. I am not really convinced that she has been wearing compression stockings although she certainly has them. 12/23; patient's wound on the left medial lower extremity is totally healed. She has surrounding venous inflammation and skin damage related to there is also some degree of lymphedema and fibrosed skin in the distal lower extremity. She has her compression stocking Readmission: 03/25/2021 upon evaluation today patient appears to be doing excellent in regard to her wound all things considered. She does appear to have gotten very quickly this time which is great news compared to before when she tells me she let the wound get somewhat out of control. Nonetheless right now she tells me this has been present for about 3 weeks she has been using Vaseline on it. She does have a history of diabetes though she has not taken Metformin for years she sees her primary care provider on Friday and she will see what he says but she is hoping he would not put her back on this. Subsequently she also does have Lasix that she is not been taking it recently. She has a past medical history significant for venous stasis, lymphedema, diabetes mellitus type 2, and hypertension. Electronic Signature(s) Signed: 03/25/2021 2:52:56 PM By: Worthy Keeler PA-C Entered By: Worthy Keeler on 03/25/2021 14:52:56 Jenna [name], Jenna [name] ([id number]) -------------------------------------------------------------------------------- Physical Exam Details Patient Name: Jenna [name] Date of Service: 03/25/2021 2:15 PM Medical Record Number: [id number] Patient Account Number: [id number] Date of Birth/Sex: 05/30/1931 (87 y.o. F) Treating RN: Carlene Coria Primary Care Provider: [name], [place] Other Clinician: Jeanine Luz Referring Provider: Referral, Self Treating Provider/Extender: Skipper Cliche in Treatment: 0 Constitutional patient is hypertensive.. pulse regular and within target range for patient.Marland Kitchen respirations regular, non-labored and within target range for patient.Marland Kitchen temperature within target range for patient.. Well-nourished and well-hydrated in no acute distress. Eyes conjunctiva clear no eyelid edema noted. pupils equal round and reactive to light and accommodation. Ears, Nose, Mouth, and Throat no gross abnormality of ear auricles or external auditory canals. normal hearing noted during conversation. mucus membranes moist. Respiratory normal breathing without difficulty. Cardiovascular 2+ dorsalis pedis/posterior tibialis pulses. no clubbing, cyanosis, significant edema, <3 sec cap refill. Musculoskeletal normal gait and posture. no significant deformity or arthritic changes, no loss or range of motion, no clubbing. Psychiatric this patient is able to make decisions and demonstrates good insight into disease process. Alert and Oriented x 3. pleasant and cooperative. Notes Upon inspection patient's wound bed actually showed signs of good epithelization. There does not appear to be any significant depth I agree with the assessment that this is probably more limited to breakdown of skin. I do not think that there is significant fat layer exposure which is great news. With that being said I think that she did, the right  time to get this thing healed much more quickly and I am very pleased that she is here at this point her ABIs were excellent 1.28 on this left lower extremity which is great news as well. Electronic Signature(s) Signed: 03/25/2021  2:53:44 PM By: Worthy Keeler PA-C Entered By: Worthy Keeler on 03/25/2021 14:53:43 [place], Jenna [name] ([id number]) -------------------------------------------------------------------------------- Physician Orders Details Patient Name: Jenna [name] Date of Service: 03/25/2021 2:15 PM Medical Record Number: [id number] Patient Account Number: [id number] Date of Birth/Sex: 04-08-1931 (87 y.o. F) Treating RN: Carlene Coria Primary Care Provider: [name], [place]ib[place] Other Clinician: Jeanine Luz Referring Provider: Referral, Self Treating Provider/Extender: Skipper Cliche in Treatment: 0 Verbal / Phone Orders: No Diagnosis Coding ICD-10 Coding Code Description I87.2 Venous insufficiency (chronic) (peripheral) I89.0 Lymphedema, not elsewhere classified L97.821 Non-pressure chronic ulcer of other part of left lower leg limited to breakdown of skin E11.622 Type 2 diabetes mellitus with other skin ulcer I10 Essential (primary) hypertension Follow-up Appointments o Return Appointment in 1 week. Bathing/ Shower/ Hygiene o May shower with wound dressing protected with water repellent cover or cast protector. Edema Control - Lymphedema / Segmental Compressive Device / Other o Optional: One layer of unna paste to top of compression wrap (to act as an anchor). o Elevate, Exercise Daily and Avoid Standing for Long Periods of Time. o Elevate legs to the level of the heart and pump ankles as often as possible o Elevate leg(s) parallel to the floor when sitting. Wound Treatment Wound #4 - Lower Leg Wound Laterality: Left, Medial Cleanser: Soap and Water (Generic) 1 x Per Week/30 Days Discharge Instructions: Gently cleanse wound with antibacterial soap, rinse and pat dry prior to dressing wounds Primary Dressing: Silvercel Small 2x2 (in/in) (Generic) 1 x Per Week/30 Days Discharge Instructions: Apply Silvercel Small 2x2 (in/in) as instructed Secondary Dressing: ABD Pad 5x9 (in/in)  (Generic) 1 x Per Week/30 Days Discharge Instructions: Cover with ABD pad Compression Wrap: Profore LF 4 Multi-Layer Compression Bandaging System (Generic) 1 x Per Week/30 Days Discharge Instructions: Apply 4 multi-layer wrap as directed. Electronic Signature(s) Signed: 03/25/2021 5:08:01 PM By: Carlene Coria RN Signed: 03/25/2021 5:17:55 PM By: Worthy Keeler PA-C Entered By: Carlene Coria on 03/25/2021 14:52:04 Jenna [name], Jenna [name] ([id number]) -------------------------------------------------------------------------------- Problem List Details Patient Name: Jenna [name] Date of Service: 03/25/2021 2:15 PM Medical Record Number: [id number] Patient Account Number: [id number] Date of Birth/Sex: 17-Dec-1931 (87 y.o. F) Treating RN: Carlene Coria Primary Care Provider: [name], [place]ibas Other Clinician: Jeanine Luz Referring Provider: Referral, Self Treating Provider/Extender: Skipper Cliche in Treatment: 0 Active Problems ICD-10 Encounter Code Description Active Date MDM Diagnosis I87.2 Venous insufficiency (chronic) (peripheral) 03/25/2021 No Yes I89.0 Lymphedema, not elsewhere classified 03/25/2021 No Yes L97.821 Non-pressure chronic ulcer of other part of left lower leg limited to 03/25/2021 No Yes breakdown of skin E11.622 Type 2 diabetes mellitus with other skin ulcer 03/25/2021 No Yes I10 Essential (primary) hypertension 03/25/2021 No Yes Inactive Problems Resolved Problems Electronic Signature(s) Signed: 03/25/2021 2:43:11 PM By: Worthy Keeler PA-C Entered By: Worthy Keeler on 03/25/2021 14:43:09 Odette, Jenna [name] ([id number]) -------------------------------------------------------------------------------- Progress Note Details Patient Name: Jenna [name] Date of Service: 03/25/2021 2:15 PM Medical Record Number: [id number] Patient Account Number: [id number] Date of Birth/Sex: 1931-02-12 (87 y.o. F) Treating RN: Carlene Coria Primary Care Provider: [name],  [place] Other Clinician: Jeanine Luz Referring Provider: Referral, Self Treating Provider/Extender: Skipper Cliche in Treatment: 0 Subjective Chief Complaint Information obtained from Patient Left LE Ulcer History of Present  Illness (HPI) The following HPI elements were documented for the patient's wound: Location: left lower extremity in the medial ankle region Quality: Patient reports experiencing a dull pain to affected area(s). Severity: Patient states wound are getting worse. Duration: Patient has had the wound for > 3 months prior to seeking treatment at the wound center Timing: Pain in wound is Intermittent (comes and goes Context: The wound appeared gradually over time Modifying Factors: Other treatment(s) tried include:[name] Associated Signs and Symptoms: Patient reports having increase swelling. 87 year old patient with a past medical history significant for venous stasis ulceration and diabetes mellitus was recently admitted to the hospital between 07/07/2016 and 07/09/2016. She was wound to have a nonpurulent cellulitis of the left lower extremity and was started on IV antibiotics which included vancomycin. He is discharged home with her and Unna's boots and oral doxycycline. During this admission there was no obvious DVT involving the left lower extremity. her past medical history significant for diabetes mellitus, hypertension, hyperlipidemia, varicose veins, status post right rotator cuff repair, knee surgery on the left,robotic abdominal hysterectomy, laparoscopic cholecystectomy. She is not a smoker. Venous study done on 07/18/2015 showed normal left extremity deep venous system with no evidence of DVT. There was extensive valvular incompetence and reflux throughout the left greater saphenous vein with direct the medication to subcutaneous varicose veins. Normal left small saphenous vein. I understand she was  seen by vascular radiology group and the workup and recommendation was for endovenous ablation but due to family pressure she was not able to keep her appointment a year ago. She has not been wearing her compression stockings regularly. 05/26/18 READMISSION this is an 87 year old woman who was previously seen in this clinic in 2017 by Dr. Con Memos. She has chronic venous insufficiency with lymphedema and at that time had open area on the left medial calf and ankle area. Was recommended that she wear 20-30 mm compression stockings and the patient tells me that she has been compliant with this although I think her primary doctor recently told her that she didn't need to wear stockings out of fear it might cause arterial compression. She noticed edema and pain in the area a week to 2 ago. She saw her primary doctor and was given doxycycline and Silvadene cream to put on the area. She states things are a lot better. The patient has a history of type 2 diabetes on oral agents but she is unaware of her hemoglobin A1c for her blood glucose which she doesn't check. She has hypertension, varicose veins, rotator cuff repair, knee surgery on the left, history of a laparoscopic cholecystectomy, lymphedema, obstructive sleep apnea, history of endometrial CA. The patient has been to see vein and vascular in the past and I think has had an ablation of the left greater saphenous vein based on ultrasounds of the left leg IC dating back to 2017. Her most recent ultrasound was in May 2018 which showed no evidence of a DVT and continued durable closure of the treated segment of the left greater saphenous vein. Patent lateral accessory branch of the greater saphenous vein extending to the calf ABIs in our clinic were 1.05 on the right and 0.95 on the left 06/02/18 the patient's wound on the left medial lower calf is completely closed and epithelialized. She has new 20-30 mm below-knee stockings READMISSION 11/02/2019 This  is a now 87 year old woman who has been in this clinic 2 times before. Most recently in 2019.  She has chronic venous insufficiency with some degree of secondary lymphedema. She has had a history of a left greater saphenous vein ablation. When she is here in 2019 she had a wound on the left medial lower calf. This closed fairly easily. It was recommended she wear 20/30 mm below-knee stockings she is not compliant with this. She arrives in clinic with a 2-week history of a left medial lower extremity and ankle wound. Some of this has dry slough on it but most of it is already epithelialize she has been using a combination of Vaseline and/or Silvadene. She is not wearing any compression. She has a history of chronic venous reflux. There was recommendations in the past for ablations I do not know that she ever carried through with this. According to notes from 2016 this work-up was done via the interventional radiology group. We will need to research this. She is probably going to need a consultation ABIs in our clinic were 1.03 on the right and 0.93 on the left 11/11; patient's wound looks as though it is epithelializing horizontal wound on the left medial lower extremity. She has a superior satellite lesion. Jenna [name], Jenna [name] ([id number]) She is complaining of a lot of pain in 3 layer compression. There have been recommendations for previous ablations I am not sure if she followed up on this. She has been noncompliant with stockings although she brought those into the clinic today. 11/18; the patient had a repeat venous reflux studies at vein and vascular. This did not show any DVT in the left lower extremity there was no evidence of chronic venous insufficiency and no evidence of superficial vein thrombosis. I looked back at previous studies done I think in interventional radiology in 2018 would suggest that there had been previous ablation of a segment of the left greater saphenous vein. By  review of the new study I do not think the patient needs to see vein and vascular however I find these studies increasingly difficult to interpret. I am not sure that the patient has actually consistently worn compression stockings and I think that is the next step here. The wound is just about closed 11/22/2019 upon evaluation today patient actually appears to be healed based on what I am seeing today. She has been tolerating the dressing changes without complication. Fortunately there is no signs of active infection at this time. Overall I feel like even though this is the first time of seeing her that she has actually done extremely well with the current wound care measures again she has been under the care of Dr. Dellia Nims. 12/2 the patient was expected to be healed this week however she comes in with 3 small wounds that look much the same as the pictures from 2 weeks ago. These are all in the area of the left medial lower extremity. She was put into her own compression stockings last week I do not think the edema control was adequate in this area for healing. We have been using Hydrofera Blue 12/9; wound is fully epithelialized but still looks vulnerable on the left medial lower extremity. Surrounding venous inflammation. I think she has lymphedema with fibrosed skin in the distal lower leg. [Inverted bottle sign]. She has had venous reflux studies that have not shown any of the superficial veins to be amenable to ablations. This is been repeated during this visit. I am not really convinced that she has been wearing compression stockings although she certainly has them. 12/23; patient's wound on the  left medial lower extremity is totally healed. She has surrounding venous inflammation and skin damage related to there is also some degree of lymphedema and fibrosed skin in the distal lower extremity. She has her compression stocking Readmission: 03/25/2021 upon evaluation today patient appears to be  doing excellent in regard to her wound all things considered. She does appear to have gotten very quickly this time which is great news compared to before when she tells me she let the wound get somewhat out of control. Nonetheless right now she tells me this has been present for about 3 weeks she has been using Vaseline on it. She does have a history of diabetes though she has not taken Metformin for years she sees her primary care provider on Friday and she will see what he says but she is hoping he would not put her back on this. Subsequently she also does have Lasix that she is not been taking it recently. She has a past medical history significant for venous stasis, lymphedema, diabetes mellitus type 2, and hypertension. Patient History Information obtained from Patient. Allergies Shellfish Containing Products (Severity: Severe, Reaction: anaphlaxsis), Ancef (Severity: Severe, Reaction: nausea and vomiting), shellfish derived (Reaction: anaphalaxis) Family History Cancer - Siblings, Diabetes - Siblings, Hypertension - Maternal Grandparents,Paternal Grandparents,Mother,Father,Siblings, Lung Disease - Siblings, No family history of Heart Disease, Hereditary Spherocytosis, Kidney Disease, Seizures, Stroke, Thyroid Problems, Tuberculosis. Social History Never smoker, Marital Status - Married, Alcohol Use - Never, Drug Use - No History, Caffeine Use - Daily. Medical History Eyes Patient has history of Cataracts - surgery, Glaucoma - left Ear/Nose/Mouth/Throat Denies history of Chronic sinus problems/congestion, Middle ear problems Hematologic/Lymphatic Patient has history of Lymphedema Denies history of Anemia, Hemophilia, Human Immunodeficiency Virus, Sickle Cell Disease Respiratory Denies history of Aspiration, Asthma, Chronic Obstructive Pulmonary Disease (COPD), Pneumothorax, Sleep Apnea, Tuberculosis Cardiovascular Patient has history of Hypertension Denies history of Angina,  Arrhythmia, Congestive Heart Failure, Coronary Artery Disease, Deep Vein Thrombosis, Hypotension, Myocardial Infarction, Peripheral Arterial Disease, Peripheral Venous Disease, Phlebitis, Vasculitis Gastrointestinal Denies history of Cirrhosis , Colitis, Crohn s, Hepatitis A, Hepatitis B, Hepatitis C Endocrine Patient has history of Type II Diabetes Genitourinary Denies history of End Stage Renal Disease Immunological Denies history of Lupus Erythematosus, Raynaud s, Scleroderma Integumentary (Skin) Denies history of History of Burn, History of pressure wounds Musculoskeletal Patient has history of Osteoarthritis Denies history of Gout, Rheumatoid Arthritis, Osteomyelitis Neurologic Denies history of Dementia, Neuropathy, Quadriplegia, Paraplegia, Seizure Disorder Oncologic Denies history of Received Chemotherapy, Received Radiation Psychiatric Denies history of Anorexia/bulimia, Confinement Anxiety Jenna [name], Jenna [name] ([id number]) Patient is treated with Controlled Diet. Blood sugar is tested. Objective Constitutional patient is hypertensive.. pulse regular and within target range for patient.Marland Kitchen respirations regular, non-labored and within target range for patient.Marland Kitchen temperature within target range for patient.. Well-nourished and well-hydrated in no acute distress. Vitals Time Taken: 2:10 PM, Height: 60 in, Temperature: 98 F, Pulse: 76 bpm, Respiratory Rate: 18 breaths/min, Blood Pressure: 170/83 mmHg. Eyes conjunctiva clear no eyelid edema noted. pupils equal round and reactive to light and accommodation. Ears, Nose, Mouth, and Throat no gross abnormality of ear auricles or external auditory canals. normal hearing noted during conversation. mucus membranes moist. Respiratory normal breathing without difficulty. Cardiovascular 2+ dorsalis pedis/posterior tibialis pulses. no clubbing, cyanosis, significant edema, Musculoskeletal normal gait and posture. no significant deformity  or arthritic changes, no loss or range of motion, no clubbing. Psychiatric this patient is able to make decisions and demonstrates good insight into disease process. Alert and Oriented  x 3. pleasant and cooperative. General Notes: Upon inspection patient's wound bed actually showed signs of good epithelization. There does not appear to be any significant depth I agree with the assessment that this is probably more limited to breakdown of skin. I do not think that there is significant fat layer exposure which is great news. With that being said I think that she did, the right time to get this thing healed much more quickly and I am very pleased that she is here at this point her ABIs were excellent 1.28 on this left lower extremity which is great news as well. Integumentary (Hair, Skin) Wound #4 status is Open. Original cause of wound was Gradually Appeared. The date acquired was: 03/04/2021. The wound is located on the Left,Medial Lower Leg. The wound measures 0.9cm length x 1cm width x 0.1cm depth; 0.707cm^2 area and 0.071cm^3 volume. The wound is limited to skin breakdown. There is a large amount of serous drainage noted. The wound margin is flat and intact. There is no granulation within the wound bed. There is no necrotic tissue within the wound bed. Assessment Active Problems ICD-10 Venous insufficiency (chronic) (peripheral) Lymphedema, not elsewhere classified Non-pressure chronic ulcer of other part of left lower leg limited to breakdown of skin Type 2 diabetes mellitus with other skin ulcer Essential (primary) hypertension Procedures Wound #4 Pre-procedure diagnosis of Wound #4 is a Lymphedema located on the Left,Medial Lower Leg . There was a Four Layer Compression Therapy Jenna [name], Jenna [name] ([id number]) Procedure by Carlene Coria, RN. Post procedure Diagnosis Wound #4: Same as Pre-Procedure Plan Follow-up Appointments: Return Appointment in 1 week. Bathing/ Shower/ Hygiene: May  shower with wound dressing protected with water repellent cover or cast protector. Edema Control - Lymphedema / Segmental Compressive Device / Other: Optional: One layer of unna paste to top of compression wrap (to act as an anchor). Elevate, Exercise Daily and Avoid Standing for Long Periods of Time. Elevate legs to the level of the heart and pump ankles as often as possible Elevate leg(s) parallel to the floor when sitting. WOUND #4: - Lower Leg Wound Laterality: Left, Medial Cleanser: Soap and Water (Generic) 1 x Per Week/30 Days Discharge Instructions: Gently cleanse wound with antibacterial soap, rinse and pat dry prior to dressing wounds Primary Dressing: Silvercel Small 2x2 (in/in) (Generic) 1 x Per Week/30 Days Discharge Instructions: Apply Silvercel Small 2x2 (in/in) as instructed Secondary Dressing: ABD Pad 5x9 (in/in) (Generic) 1 x Per Week/30 Days Discharge Instructions: Cover with ABD pad Compression Wrap: Profore LF 4 Multi-Layer Compression Bandaging System (Generic) 1 x Per Week/30 Days Discharge Instructions: Apply 4 multi-layer wrap as directed. 1. Would recommend currently that we go ahead and initiate treatment with a recommendation for silver alginate dressing I think this can be a good way to go and the patient is in agreement with that plan. 2. I am also can recommend that we have the patient go ahead and be placed in a 4-layer compression wrap that she is tolerated in the past and did extremely well with it. I think this will get the edema under control and help her out quite significantly. 3. Also sick going to suggest that she should take the Lasix to help with edema. Obviously the more she can get the swelling out of her legs the better off she will be. We will see patient back for reevaluation in 1 week here in the clinic. If anything worsens or changes patient will contact our office for additional recommendations. Electronic  Signature(s) Signed: 03/25/2021  2:54:09 PM By: Worthy Keeler PA-C Entered By: Worthy Keeler on 03/25/2021 14:54:09 Jenna [name], Jenna [name] ([id number]) -------------------------------------------------------------------------------- ROS/PFSH Details Patient Name: Jenna [name] Date of Service: 03/25/2021 2:15 PM Medical Record Number: [id number] Patient Account Number: [id number] Date of Birth/Sex: August 25, 1931 (87 y.o. F) Treating RN: Cornell Barman Primary Care Provider: [name], [place] Other Clinician: Jeanine Luz Referring Provider: Referral, Self Treating Provider/Extender: Skipper Cliche in Treatment: 0 Information Obtained From Patient Eyes Medical History: Positive for: Cataracts - surgery; Glaucoma - left Ear/Nose/Mouth/Throat Medical History: Negative for: Chronic sinus problems/congestion; Middle ear problems Hematologic/Lymphatic Medical History: Positive for: Lymphedema Negative for: Anemia; Hemophilia; Human Immunodeficiency Virus; Sickle Cell Disease Respiratory Medical History: Negative for: Aspiration; Asthma; Chronic Obstructive Pulmonary Disease (COPD); Pneumothorax; Sleep Apnea; Tuberculosis Cardiovascular Medical History: Positive for: Hypertension Negative for: Angina; Arrhythmia; Congestive Heart Failure; Coronary Artery Disease; Deep Vein Thrombosis; Hypotension; Myocardial Infarction; Peripheral Arterial Disease; Peripheral Venous Disease; Phlebitis; Vasculitis Gastrointestinal Medical History: Negative for: Cirrhosis ; Colitis; Crohnos; Hepatitis A; Hepatitis B; Hepatitis C Endocrine Medical History: Positive for: Type II Diabetes Time with diabetes: 17 years Treated with: Diet Blood sugar tested every day: Yes Tested : rarely Genitourinary Medical History: Negative for: End Stage Renal Disease Immunological Medical History: Negative for: Lupus Erythematosus; Raynaudos; Scleroderma Integumentary (Skin) Medical History: Negative for: History of Burn; History of pressure  wounds Jenna [name], Jenna [name] ([id number]) Musculoskeletal Medical History: Positive for: Osteoarthritis Negative for: Gout; Rheumatoid Arthritis; Osteomyelitis Neurologic Medical History: Negative for: Dementia; Neuropathy; Quadriplegia; Paraplegia; Seizure Disorder Oncologic Medical History: Negative for: Received Chemotherapy; Received Radiation Psychiatric Medical History: Negative for: Anorexia/bulimia; Confinement Anxiety HBO Extended History Items Eyes: Eyes: Cataracts Glaucoma Immunizations Pneumococcal Vaccine: Received Pneumococcal Vaccination: Yes Implantable Devices None Family and Social History Cancer: Yes - Siblings; Diabetes: Yes - Siblings; Heart Disease: No; Hereditary Spherocytosis: No; Hypertension: Yes - Maternal Grandparents,Paternal Grandparents,Mother,Father,Siblings; Kidney Disease: No; Lung Disease: Yes - Siblings; Seizures: No; Stroke: No; Thyroid Problems: No; Tuberculosis: No; Never smoker; Marital Status - Married; Alcohol Use: Never; Drug Use: No History; Caffeine Use: Daily; Financial Concerns: No; Food, Clothing or Shelter Needs: No; Support System Lacking: No; Transportation Concerns: No Electronic Signature(s) Signed: 03/25/2021 5:17:55 PM By: Worthy Keeler PA-C Signed: 03/26/2021 6:10:08 PM By: Gretta Cool, [name], RN, CWS, [name], [name] Entered By: Gretta Cool, [name], RN, CWS, Kim on 03/25/2021 14:31:09 Jenna [name], Jenna [name] ([id number]) -------------------------------------------------------------------------------- SuperBill Details Patient Name: Jenna [name] Date of Service: 03/25/2021 Medical Record Number: [id number] Patient Account Number: [id number] Date of Birth/Sex: 10/31/1931 (87 y.o. F) Treating RN: Carlene Coria Primary Care Provider: [name], [place] Other Clinician: Jeanine Luz Referring Provider: Referral, Self Treating Provider/Extender: Skipper Cliche in Treatment: 0 Diagnosis Coding ICD-10 Codes Code Description I87.2 Venous  insufficiency (chronic) (peripheral) I89.0 Lymphedema, not elsewhere classified L97.821 Non-pressure chronic ulcer of other part of left lower leg limited to breakdown of skin E11.622 Type 2 diabetes mellitus with other skin ulcer I10 Essential (primary) hypertension Facility Procedures CPT4 Code: 22633354 Description: 99213 - WOUND CARE VISIT-LEV 3 EST PT Modifier: 25 Quantity: 1 CPT4 Code: 56256389 Description: (Facility Use Only) 29581LT - APPLY MULTLAY COMPRS LWR LT LEG Modifier: Quantity: 1 Physician Procedures CPT4 Code: 3734287 Description: 68115 - WC PHYS LEVEL 3 - EST PT Modifier: Quantity: 1 CPT4 Code: Description: ICD-10 Diagnosis Description I87.2 Venous insufficiency (chronic) (peripheral) I89.0 Lymphedema, not elsewhere classified L97.821 Non-pressure chronic ulcer of other part of left lower leg limited to bre E11.622 Type 2 diabetes mellitus with  other skin  ulcer Modifier: akdown of skin Quantity: Electronic Signature(s) Signed: 03/25/2021 2:54:46 PM By: Worthy Keeler PA-C Entered By: Worthy Keeler on 03/25/2021 14:54:44

## 2021-03-26 NOTE — Progress Notes (Signed)
Exposed: No Limited to Skin Breakdown Treatment Notes Wound #4 (Lower Leg) Wound Laterality: Left, Medial Cleanser Soap and Water Discharge Instruction: Gently cleanse wound with antibacterial soap, rinse and pat dry prior to dressing wounds Peri-Wound Care Topical Primary Dressing Silvercel Small 2x2 (in/in) Discharge Instruction: Apply Silvercel Small 2x2 (in/in) as instructed Secondary Dressing ABD Pad 5x9 (in/in) Discharge Instruction: Cover with ABD pad Secured With Compression Wrap Jenna [name], Jenna [name] ([id number]) Profore LF [employer] [place] Discharge Instruction: Apply 4 multi-layer wrap as directed. Compression Stockings Environmental education officer) Signed: 03/26/2021 6:10:08 PM By: Gretta Cool, [name], RN, CWS, [name], [name] Entered By: Gretta Cool, [name], RN, CWS, Kim on 03/25/2021 14:28:15 Jenna [name] ([id number]) -------------------------------------------------------------------------------- Vitals Details Patient Name: Jenna [name] Date of Service: 03/25/2021 2:15 PM Medical Record Number: [id number] Patient Account Number: [id number] Date of Birth/Sex: [birth year] (87 y.o. F) Treating RN: Cornell Barman Primary Care Dustine Bertini: [name], Dibas Other Clinician: Jeanine Luz Referring Amil Moseman: Referral, Self Treating Mauriah Mcmillen/Extender: Skipper Cliche in Treatment: 0 Vital Signs Time Taken: 14:10 Temperature (F): 98 Height (in): 60 Pulse (bpm): 76 Respiratory Rate (breaths/min): 18 Blood Pressure (mmHg): 170/83 Reference Range: 80 - 120 mg / dl Electronic Signature(s) Signed: 03/26/2021 6:10:08 PM By: Gretta Cool, [name], RN, CWS, [name], [name] Entered By: Gretta Cool, [name], RN, CWS,  Kim on 03/25/2021 14:13:47  0 Hypo/Hyperglycemic Management (do not check if billed separately) X- 1 15 Ankle / Brachial Index (ABI) - do not check if billed separately Has the patient been seen at the hospital within the last three years: Yes Total Score: 90 Level Of Care: New/Established - Level 3 Jenna [name] ([id number]) Electronic Signature(s) Signed: 03/25/2021 5:08:01 PM By: Carlene Coria RN Entered By: Carlene Coria on 03/25/2021 14:53:28 Jenna [name] ([id number]) -------------------------------------------------------------------------------- Compression Therapy Details Patient Name: Jenna [name] Date of Service: 03/25/2021 2:15 PM Medical Record Number: [id number] Patient Account Number: [id number] Date of Birth/Sex: 30-Apr-1931 (87 y.o. F) Treating RN: Carlene Coria Primary Care Aparna Vanderweele: [name], Dibas Other Clinician: Jeanine Luz Referring Anguel Delapena: Referral, Self Treating Juliya Magill/Extender: Skipper Cliche in Treatment: 0 Compression Therapy Performed for Wound Assessment: Wound #4 Left,Medial Lower Leg Performed By: Clinician Carlene Coria, RN Compression Type: Four Layer Post Procedure Diagnosis Same as Pre-procedure Electronic Signature(s) Signed: 03/25/2021 5:08:01 PM By: Carlene Coria RN Entered By: Carlene Coria on 03/25/2021 14:49:45 Jenna [name] ([id number]) -------------------------------------------------------------------------------- Encounter Discharge Information Details Patient Name: Jenna [name]. Date of Service: 03/25/2021 2:15 PM Medical Record Number: [id number] Patient Account Number: [id number] Date of Birth/Sex: 1931-03-02 (87 y.o.  F) Treating RN: Dolan Amen Primary Care Bud Kaeser: [name], Dibas Other Clinician: Jeanine Luz Referring Klaryssa Fauth: Referral, Self Treating Thurma Priego/Extender: Skipper Cliche in Treatment: 0 Encounter Discharge Information Items Discharge Condition: Stable Ambulatory Status: Cane Discharge Destination: Home Transportation: Private Auto Accompanied By: self Schedule Follow-up Appointment: Yes Clinical Summary of Care: Electronic Signature(s) Signed: 03/25/2021 5:07:34 PM By: Jenna Mouse, Minus Breeding RN Entered By: Jenna [name] on 03/25/2021 15:06:15 Jenna [name] ([id number]) -------------------------------------------------------------------------------- Lower Extremity Assessment Details Patient Name: Jenna [name] Date of Service: 03/25/2021 2:15 PM Medical Record Number: [id number] Patient Account Number: [id number] Date of Birth/Sex: 12-Feb-1931 (87 y.o. F) Treating RN: Cornell Barman Primary Care Maelin Kurkowski: [name], Dibas Other Clinician: Jeanine Luz Referring Cebert Dettmann: Referral, Self Treating Pressley Barsky/Extender: Skipper Cliche in Treatment: 0 Edema Assessment Assessed: [Left: No] [Right: No] [Left: Edema] [Right: :] Calf Left: Right: Point of Measurement: 30 cm From Medial Instep 45.5 cm Ankle Left: Right: Point of Measurement: 10 cm From Medial Instep 26 cm Knee To Floor Left: Right: From Medial Instep 38 cm Vascular Assessment Pulses: Dorsalis Pedis Palpable: [Left:Yes] Doppler Audible: [Left:Yes] Posterior Tibial Palpable: [Left:Yes] Doppler Audible: [Left:Yes] Blood Pressure: Brachial: [Left:142] Dorsalis Pedis: 170 Ankle: Posterior Tibial: 182 Ankle Brachial Index: [Left:1.28] Electronic Signature(s) Signed: 03/26/2021 6:10:08 PM By: Gretta Cool, [name], RN, CWS, [name], [name] Entered By: Gretta Cool, [name], RN, CWS, Kim on 03/25/2021 14:24:05 Jenna [name]  ([id number]) -------------------------------------------------------------------------------- Multi Wound Chart Details Patient Name: Jenna [name] Date of Service: 03/25/2021 2:15 PM Medical Record Number: [id number] Patient Account Number: [id number] Date of Birth/Sex: [birth year] (87 y.o. F) Treating RN: Carlene Coria Primary Care Idris Edmundson: [name], Dibas Other Clinician: Jeanine Luz Referring Lisett Dirusso: Referral, Self Treating Masiyah Engen/Extender: Skipper Cliche in Treatment: 0 Vital Signs Height(in): 60 Pulse(bpm): 33 Weight(lbs): Blood Pressure(mmHg): 170/83 Body Mass Index(BMI): Temperature(F): 98 Respiratory Rate(breaths/min): 18 Photos: [4:No Photos] [N/A:N/A] Wound Location: [4:Left, Medial Lower Leg] [N/A:N/A] Wounding Event: [4:Gradually Appeared] [N/A:N/A] Primary Etiology: [4:Lymphedema] [N/A:N/A] Date Acquired: [4:03/04/2021] [N/A:N/A] Weeks of Treatment: [4:0] [N/A:N/A] Wound Status: [4:Open] [N/A:N/A] Measurements L x W x D (cm) [4:0.9x1x0.1] [N/A:N/A] Area (cm) : [4:0.707] [N/A:N/A] Volume (cm) : [4:0.071] [N/A:N/A] % Reduction in Area: [4:0.00%] [N/A:N/A] % Reduction in Volume: [4:0.00%] [N/A:N/A] Classification: [4:Partial Thickness] [N/A:N/A] Exudate Amount: [4:Large] [N/A:N/A] Exudate Type: [4:Serous] [N/A:N/A] Exudate Color: [4:amber] [  Exposed: No Limited to Skin Breakdown Treatment Notes Wound #4 (Lower Leg) Wound Laterality: Left, Medial Cleanser Soap and Water Discharge Instruction: Gently cleanse wound with antibacterial soap, rinse and pat dry prior to dressing wounds Peri-Wound Care Topical Primary Dressing Silvercel Small 2x2 (in/in) Discharge Instruction: Apply Silvercel Small 2x2 (in/in) as instructed Secondary Dressing ABD Pad 5x9 (in/in) Discharge Instruction: Cover with ABD pad Secured With Compression Wrap Jenna [name], Jenna [name] ([id number]) Profore LF [employer] [place] [place] Discharge Instruction: Apply 4 multi-layer wrap as directed. Compression Stockings Environmental education officer) Signed: 03/26/2021 6:10:08 PM By: Gretta Cool, [name], RN, CWS, [name], [name] Entered By: Gretta Cool, [name], RN, CWS, Kim on 03/25/2021 14:28:15 Jenna [name] ([id number]) -------------------------------------------------------------------------------- Vitals Details Patient Name: Jenna [name] Date of Service: 03/25/2021 2:15 PM Medical Record Number: [id number] Patient Account Number: [id number] Date of Birth/Sex: [birth year] (87 y.o. F) Treating RN: Cornell Barman Primary Care Dustine Bertini: [name], Dibas Other Clinician: Jeanine Luz Referring Amil Moseman: Referral, Self Treating Mauriah Mcmillen/Extender: Skipper Cliche in Treatment: 0 Vital Signs Time Taken: 14:10 Temperature (F): 98 Height (in): 60 Pulse (bpm): 76 Respiratory Rate (breaths/min): 18 Blood Pressure (mmHg): 170/83 Reference Range: 80 - 120 mg / dl Electronic Signature(s) Signed: 03/26/2021 6:10:08 PM By: Gretta Cool, [name], RN, CWS, [name], [name] Entered By: Gretta Cool, [name], RN, CWS,  Kim on 03/25/2021 14:13:47  Exposed: No Limited to Skin Breakdown Treatment Notes Wound #4 (Lower Leg) Wound Laterality: Left, Medial Cleanser Soap and Water Discharge Instruction: Gently cleanse wound with antibacterial soap, rinse and pat dry prior to dressing wounds Peri-Wound Care Topical Primary Dressing Silvercel Small 2x2 (in/in) Discharge Instruction: Apply Silvercel Small 2x2 (in/in) as instructed Secondary Dressing ABD Pad 5x9 (in/in) Discharge Instruction: Cover with ABD pad Secured With Compression Wrap Jenna [name], Jenna [name] ([id number]) Profore LF [employer] [place] [place] Discharge Instruction: Apply 4 multi-layer wrap as directed. Compression Stockings Environmental education officer) Signed: 03/26/2021 6:10:08 PM By: Gretta Cool, [name], RN, CWS, [name], [name] Entered By: Gretta Cool, [name], RN, CWS, Kim on 03/25/2021 14:28:15 Jenna [name] ([id number]) -------------------------------------------------------------------------------- Vitals Details Patient Name: Jenna [name] Date of Service: 03/25/2021 2:15 PM Medical Record Number: [id number] Patient Account Number: [id number] Date of Birth/Sex: [birth year] (87 y.o. F) Treating RN: Cornell Barman Primary Care Dustine Bertini: [name], Dibas Other Clinician: Jeanine Luz Referring Amil Moseman: Referral, Self Treating Mauriah Mcmillen/Extender: Skipper Cliche in Treatment: 0 Vital Signs Time Taken: 14:10 Temperature (F): 98 Height (in): 60 Pulse (bpm): 76 Respiratory Rate (breaths/min): 18 Blood Pressure (mmHg): 170/83 Reference Range: 80 - 120 mg / dl Electronic Signature(s) Signed: 03/26/2021 6:10:08 PM By: Gretta Cool, [name], RN, CWS, [name], [name] Entered By: Gretta Cool, [name], RN, CWS,  Kim on 03/25/2021 14:13:47

## 2021-03-26 NOTE — Progress Notes (Signed)
Jenna, [name] ([id number]) Visit Report for 03/25/2021 Abuse/Suicide Risk Screen Details Patient Name: Jenna [name], Jenna [name]. Date of Service: 03/25/2021 2:15 PM Medical Record Number: [id number] Patient Account Number: [id number] Date of Birth/Sex: [birth year] (87 y.o. F) Treating RN: Cornell Barman Primary Care Hassani Sliney: [name], Dibas Other Clinician: Jeanine Luz Referring Norville Dani: Referral, Self Treating Jalaine Riggenbach/Extender: Skipper Cliche in Treatment: 0 Abuse/Suicide Risk Screen Items Answer ABUSE RISK SCREEN: Has anyone close to you tried to hurt or harm you recentlyo No Do you feel uncomfortable with anyone in your familyo No Has anyone forced you do things that you didnot want to doo No Electronic Signature(s) Signed: 03/26/2021 6:10:08 PM By: Gretta Cool, [name], RN, CWS, [name], [name] Entered By: Gretta Cool, [name], RN, CWS, Kim on 03/25/2021 14:31:16 Jenna [name] ([id number]) -------------------------------------------------------------------------------- Activities of Daily Living Details Patient Name: Jenna, [name]. Date of Service: 03/25/2021 2:15 PM Medical Record Number: [id number] Patient Account Number: [id number] Date of Birth/Sex: [birth year] (87 y.o. F) Treating RN: Cornell Barman Primary Care Vendela Troung: [name], Dibas Other Clinician: Jeanine Luz Referring Alezander Dimaano: Referral, Self Treating Darneshia Demary/Extender: Skipper Cliche in Treatment: 0 Activities of Daily Living Items Answer Activities of Daily Living (Please select one for each item) Drive Automobile Not Able Take Medications Completely Able Use Telephone Completely Able Care for Appearance Completely Able Use Toilet Completely Able Bath / Shower Completely Able Dress Self Completely Able Feed Self Completely Able Walk Completely Able Get In / Out Bed Completely Able Housework Completely Able Prepare Meals Completely [place] for Self Completely Able Electronic  Signature(s) Signed: 03/26/2021 6:10:08 PM By: Gretta Cool, [name], RN, CWS, [name], [name] Entered By: Gretta Cool, [name], RN, CWS, Kim on 03/25/2021 14:31:43 Jenna [name] ([id number]) -------------------------------------------------------------------------------- Education Screening Details Patient Name: Jenna [name] Date of Service: 03/25/2021 2:15 PM Medical Record Number: [id number] Patient Account Number: [id number] Date of Birth/Sex: [birth year] (87 y.o. F) Treating RN: Cornell Barman Primary Care Porter Moes: [name], Dibas Other Clinician: Jeanine Luz Referring Chantilly Linskey: Referral, Self Treating Koda Routon/Extender: Skipper Cliche in Treatment: 0 Primary Learner Assessed: Patient Learning Preferences/Education Level/Primary Language Learning Preference: Explanation, Demonstration Highest Education Level: High School Preferred Language: English Cognitive Barrier Language Barrier: No Translator Needed: No Memory Deficit: No Emotional Barrier: No Cultural/Religious Beliefs Affecting Medical Care: No Physical Barrier Impaired Vision: Yes Glasses Impaired Hearing: No Decreased Hand dexterity: No Knowledge/Comprehension Knowledge Level: High Comprehension Level: High Ability to understand written instructions: High Ability to understand verbal instructions: High Motivation Anxiety Level: Calm Cooperation: Cooperative Education Importance: Acknowledges Need Interest in Health Problems: Asks Questions Perception: Coherent Willingness to Engage in Self-Management High Activities: Readiness to Engage in Self-Management High Activities: Electronic Signature(s) Signed: 03/26/2021 6:10:08 PM By: Gretta Cool, [name], RN, CWS, [name], [name] Entered By: Gretta Cool, [name], RN, CWS, Kim on 03/25/2021 14:32:42 TREY, BEBEE ([id number]) -------------------------------------------------------------------------------- Fall Risk Assessment Details Patient Name: Jenna [name] Date of Service:  03/25/2021 2:15 PM Medical Record Number: [id number] Patient Account Number: [id number] Date of Birth/Sex: 1931-05-05 (87 y.o. F) Treating RN: Cornell Barman Primary Care Rasul Decola: [name], Dibas Other Clinician: Jeanine Luz Referring Pattijo Juste: Referral, Self Treating Caliegh Middlekauff/Extender: Skipper Cliche in Treatment: 0 Fall Risk Assessment Items Have you had 2 or more falls in the last 12 monthso 0 No Have you had any fall that resulted in injury in the last 12 monthso 0 No FALLS RISK SCREEN History of falling - immediate or within 3 months 0 No Secondary diagnosis (Do you have 2 or more medical diagnoseso) 0  No Ambulatory aid None/bed rest/wheelchair/nurse 0 No Crutches/cane/walker 15 Yes Furniture 0 No Intravenous therapy Access/Saline/Heparin Lock 0 No Gait/Transferring Normal/ bed rest/ wheelchair 0 Yes Weak (short steps with or without shuffle, stooped but able to lift head while walking, may 0 No seek support from furniture) Impaired (short steps with shuffle, may have difficulty arising from chair, head down, impaired 0 No balance) Mental Status Oriented to own ability 0 Yes Electronic Signature(s) Signed: 03/26/2021 6:10:08 PM By: Gretta Cool, [name], RN, CWS, [name], [name] Entered By: Gretta Cool, [name], RN, CWS, Kim on 03/25/2021 14:33:15 Jenna [name] ([id number]) -------------------------------------------------------------------------------- Foot Assessment Details Patient Name: Jenna [name] Date of Service: 03/25/2021 2:15 PM Medical Record Number: [id number] Patient Account Number: [id number] Date of Birth/Sex: [birth year] (87 y.o. F) Treating RN: Cornell Barman Primary Care Michaelyn Wall: [name], Dibas Other Clinician: Jeanine Luz Referring Pierre Cumpton: Referral, Self Treating Elisa Kutner/Extender: Skipper Cliche in Treatment: 0 Foot Assessment Items Site Locations + = Sensation present, - = Sensation absent, C = Callus, U = Ulcer R = Redness, W = Warmth, M = Maceration, PU  = Pre-ulcerative lesion F = Fissure, S = Swelling, D = Dryness Assessment Right: Left: Other Deformity: No No Prior Foot Ulcer: No No Prior Amputation: No No Charcot Joint: No No Ambulatory Status: Ambulatory With Help Assistance Device: Cane GaitEnergy manager) Signed: 03/26/2021 6:10:08 PM By: Gretta Cool, [name], RN, CWS, [name], [name] Entered By: Gretta Cool, [name], RN, CWS, Kim on 03/25/2021 14:34:26 Corrie, [name] ([id number]) -------------------------------------------------------------------------------- Nutrition Risk Screening Details Patient Name: Jenna [name] Date of Service: 03/25/2021 2:15 PM Medical Record Number: [id number] Patient Account Number: [id number] Date of Birth/Sex: 1931-03-31 (87 y.o. F) Treating RN: Cornell Barman Primary Care Johnwilliam Shepperson: [name], Dibas Other Clinician: Jeanine Luz Referring Breydan Shillingburg: Referral, Self Treating Wonda Goodgame/Extender: Skipper Cliche in Treatment: 0 Height (in): 60 Weight (lbs): Body Mass Index (BMI): Nutrition Risk Screening Items Score Screening NUTRITION RISK SCREEN: I have an illness or condition that made me change the kind and/or amount of food I eat 0 No I eat fewer than two meals per day 0 No I eat few fruits and vegetables, or milk products 0 No I have three or more drinks of beer, liquor or wine almost every day 0 No I have tooth or mouth problems that make it hard for me to eat 0 No I don't always have enough money to buy the food I need 0 No I eat alone most of the time 0 No I take three or more different prescribed or over-the-counter drugs a day 0 No Without wanting to, I have lost or gained 10 pounds in the last six months 0 No I am not always physically able to shop, cook and/or feed myself 0 No Nutrition Protocols Good Risk Protocol 0 No interventions needed Moderate Risk Protocol High Risk Proctocol Risk Level: Good Risk Score: 0 Electronic Signature(s) Signed: 03/26/2021 6:10:08 PM By: Gretta Cool,  [name], RN, CWS, [name], [name] Entered By: Gretta Cool, [name], RN, CWS, Kim on 03/25/2021 14:33:26

## 2021-03-29 DIAGNOSIS — E7800 Pure hypercholesterolemia, unspecified: Secondary | ICD-10-CM | POA: Diagnosis not present

## 2021-03-29 DIAGNOSIS — Z0001 Encounter for general adult medical examination with abnormal findings: Secondary | ICD-10-CM | POA: Diagnosis not present

## 2021-03-29 DIAGNOSIS — Z79899 Other long term (current) drug therapy: Secondary | ICD-10-CM | POA: Diagnosis not present

## 2021-03-29 DIAGNOSIS — E1169 Type 2 diabetes mellitus with other specified complication: Secondary | ICD-10-CM | POA: Diagnosis not present

## 2021-03-29 DIAGNOSIS — I890 Lymphedema, not elsewhere classified: Secondary | ICD-10-CM | POA: Diagnosis not present

## 2021-03-29 DIAGNOSIS — E113293 Type 2 diabetes mellitus with mild nonproliferative diabetic retinopathy without macular edema, bilateral: Secondary | ICD-10-CM | POA: Diagnosis not present

## 2021-04-01 ENCOUNTER — Other Ambulatory Visit: Payer: Self-pay

## 2021-04-01 ENCOUNTER — Encounter: Payer: Medicare Other | Attending: Physician Assistant | Admitting: Physician Assistant

## 2021-04-01 DIAGNOSIS — I872 Venous insufficiency (chronic) (peripheral): Secondary | ICD-10-CM | POA: Diagnosis not present

## 2021-04-01 DIAGNOSIS — I890 Lymphedema, not elsewhere classified: Secondary | ICD-10-CM | POA: Diagnosis not present

## 2021-04-01 DIAGNOSIS — E11622 Type 2 diabetes mellitus with other skin ulcer: Secondary | ICD-10-CM | POA: Diagnosis not present

## 2021-04-01 DIAGNOSIS — L97821 Non-pressure chronic ulcer of other part of left lower leg limited to breakdown of skin: Secondary | ICD-10-CM | POA: Diagnosis not present

## 2021-04-01 DIAGNOSIS — L97822 Non-pressure chronic ulcer of other part of left lower leg with fat layer exposed: Secondary | ICD-10-CM | POA: Diagnosis not present

## 2021-04-01 DIAGNOSIS — I10 Essential (primary) hypertension: Secondary | ICD-10-CM | POA: Diagnosis not present

## 2021-04-01 NOTE — Progress Notes (Addendum)
Instruction: Cover with ABD pad Secured With Compression Wrap Profore LF 4 Multi-Layer Compression Bandaging System Discharge Instruction: Apply 4 multi-layer wrap as directed. Compression Stockings Add-Ons Electronic Signature(s) Signed: 04/01/2021 4:17:01 PM By: Jeanine Luz Signed: 04/05/2021 8:10:37 AM By: Carlene Coria RN Entered By: Jeanine Luz on 04/01/2021 13:38:41 Jenna [name], Jenna [name] ([id number]) -------------------------------------------------------------------------------- Vitals Details Patient Name: Jenna [name] Date of Service: 04/01/2021 1:30 PM Medical Record Number: [id number] Patient Account Number: [id number] Date of Birth/Sex: Jun 26, 1931 (87 y.o. F) Treating RN: Carlene Coria Primary Care Derrin Currey: [name], Dibas Other Clinician: Jeanine Luz Referring Anyah Swallow: [name], Dibas Treating Walda Hertzog/Extender: Skipper Cliche in Treatment: 1 Vital Signs Time Taken: 13:30 Temperature (F): 98.0 Height (in): 60 Pulse (bpm): 74 Respiratory Rate (breaths/min): 16 Blood Pressure (mmHg): 176/64 Reference Range: 80 - 120 mg / dl Electronic Signature(s) Signed: 04/01/2021 4:17:01 PM By: Jeanine Luz Entered By: Jeanine Luz on 04/01/2021 13:30:23  S. Date of Service: 04/01/2021 1:30 PM Medical Record Number: [id number] Patient Account Number: [id number] Date of Birth/Sex: 24-Jan-1931 (87 y.o. F) Treating RN: Carlene Coria Primary Care Alira Fretwell: [name], Dibas Other Clinician: Jeanine Luz Referring Tallan Sandoz: [name], Dibas Treating Karista Aispuro/Extender: Skipper Cliche in Treatment: 1 Compression Therapy Performed for Wound Assessment: Wound #4 Left,Medial Lower Leg Performed By: Clinician Carlene Coria, RN Compression Type: Four Layer Post Procedure Diagnosis Same as Pre-procedure Electronic Signature(s) Signed: 04/05/2021 8:10:37 AM By: Carlene Coria RN Entered By: Carlene Coria on 04/01/2021 14:25:33 Jenna [name] ([id number]) -------------------------------------------------------------------------------- Encounter Discharge Information Details Patient Name: Jenna [name]. Date of Service: 04/01/2021 1:30 PM Medical Record Number: [id number] Patient Account Number: [id number] Date of Birth/Sex: 01-Feb-1931 (87 y.o. F) Treating RN: Dolan Amen Primary Care Vangie Henthorn: [name], Dibas Other Clinician: Jeanine Luz Referring Loyda Costin: [name], Dibas Treating Ariaunna Longsworth/Extender: Skipper Cliche in Treatment: 1 Encounter Discharge Information Items Discharge Condition: Stable Ambulatory Status: Cane Discharge Destination: Home Transportation: Private Auto Accompanied By: husband Schedule Follow-up Appointment: Yes Clinical Summary of Care: Electronic Signature(s) Signed: 04/01/2021 4:55:51 PM By: Georges Mouse, Minus Breeding RN Entered By: Georges Mouse, Minus Breeding on 04/01/2021 14:45:31 Jenna [name]  ([id number]) -------------------------------------------------------------------------------- Lower Extremity Assessment Details Patient Name: Jenna [name] Date of Service: 04/01/2021 1:30 PM Medical Record Number: [id number] Patient Account Number: [id number] Date of Birth/Sex: January 26, 1931 (87 y.o. F) Treating RN: Carlene Coria Primary Care Kylen Ismael: [name], Dibas Other Clinician: Jeanine Luz Referring Amilio Zehnder: [name], Dibas Treating Jeanni Allshouse/Extender: Skipper Cliche in Treatment: 1 Edema Assessment Assessed: [Left: Yes] [Right: No] Edema: [Left: Ye] [Right: s] Calf Left: Right: Point of Measurement: 30 cm From Medial Instep 40.9 cm Ankle Left: Right: Point of Measurement: 10 cm From Medial Instep 23 cm Vascular Assessment Pulses: Dorsalis Pedis Palpable: [Left:Yes] Electronic Signature(s) Signed: 04/01/2021 4:17:01 PM By: Jeanine Luz Signed: 04/05/2021 8:10:37 AM By: Carlene Coria RN Entered By: Jeanine Luz on 04/01/2021 13:40:02 Jenna [name] ([id number]) -------------------------------------------------------------------------------- Multi Wound Chart Details Patient Name: Jenna [name] Date of Service: 04/01/2021 1:30 PM Medical Record Number: [id number] Patient Account Number: [id number] Date of Birth/Sex: 08-05-1931 (87 y.o. F) Treating RN: Carlene Coria Primary Care Elias Bordner: [name], Dibas Other Clinician: Jeanine Luz Referring Larna Capelle: [name], Dibas Treating Evonna Stoltz/Extender: Skipper Cliche in Treatment: 1 Vital Signs Height(in): 60 Pulse(bpm): 10 Weight(lbs): Blood Pressure(mmHg): 176/64 Body Mass Index(BMI): Temperature(F): 98.0 Respiratory Rate(breaths/min): 16 Photos: [N/A:N/A] Wound Location: Left, Medial Lower Leg N/A N/A Wounding Event: Gradually Appeared N/A N/A Primary Etiology: Lymphedema N/A N/A Comorbid History: Cataracts, Glaucoma, Lymphedema, N/A N/A Hypertension, Type II Diabetes, Osteoarthritis Date  Acquired: 03/04/2021 N/A N/A Weeks of Treatment: 1 N/A N/A Wound Status: Open N/A N/A Measurements L x W x D (cm) 0.9x1x0.1 N/A N/A Area (cm) : 0.707 N/A N/A Volume (cm) : 0.071 N/A N/A % Reduction in Area: 0.00% N/A N/A % Reduction in Volume: 0.00% N/A N/A Classification: Partial Thickness N/A N/A Exudate Amount: Small N/A N/A Exudate Type: Serous N/A N/A Exudate Color: amber N/A N/A Wound Margin: Flat and Intact N/A N/A Granulation Amount: Large (67-100%) N/A N/A Granulation Quality: Pink, Pale N/A N/A Necrotic Amount: None Present (0%) N/A N/A Exposed Structures: Fat Layer (Subcutaneous Tissue): N/A N/A Yes Fascia: No Tendon: No Muscle: No Joint: No Bone: No Treatment Notes Electronic Signature(s) Signed: 04/05/2021 8:10:37 AM By: Carlene Coria RN Entered By: Carlene Coria on 04/01/2021 14:24:52 Jenna [name] ([id number]) -------------------------------------------------------------------------------- Union Details Patient Name: Jenna [name] Date of Service: 04/01/2021 1:30 PM Medical Record Number: [id number] Patient  S. Date of Service: 04/01/2021 1:30 PM Medical Record Number: [id number] Patient Account Number: [id number] Date of Birth/Sex: 24-Jan-1931 (87 y.o. F) Treating RN: Carlene Coria Primary Care Alira Fretwell: [name], Dibas Other Clinician: Jeanine Luz Referring Tallan Sandoz: [name], Dibas Treating Karista Aispuro/Extender: Skipper Cliche in Treatment: 1 Compression Therapy Performed for Wound Assessment: Wound #4 Left,Medial Lower Leg Performed By: Clinician Carlene Coria, RN Compression Type: Four Layer Post Procedure Diagnosis Same as Pre-procedure Electronic Signature(s) Signed: 04/05/2021 8:10:37 AM By: Carlene Coria RN Entered By: Carlene Coria on 04/01/2021 14:25:33 Jenna [name] ([id number]) -------------------------------------------------------------------------------- Encounter Discharge Information Details Patient Name: Jenna [name]. Date of Service: 04/01/2021 1:30 PM Medical Record Number: [id number] Patient Account Number: [id number] Date of Birth/Sex: 01-Feb-1931 (87 y.o. F) Treating RN: Dolan Amen Primary Care Vangie Henthorn: [name], Dibas Other Clinician: Jeanine Luz Referring Loyda Costin: [name], Dibas Treating Ariaunna Longsworth/Extender: Skipper Cliche in Treatment: 1 Encounter Discharge Information Items Discharge Condition: Stable Ambulatory Status: Cane Discharge Destination: Home Transportation: Private Auto Accompanied By: husband Schedule Follow-up Appointment: Yes Clinical Summary of Care: Electronic Signature(s) Signed: 04/01/2021 4:55:51 PM By: Georges Mouse, Minus Breeding RN Entered By: Georges Mouse, Minus Breeding on 04/01/2021 14:45:31 Jenna [name]  ([id number]) -------------------------------------------------------------------------------- Lower Extremity Assessment Details Patient Name: Jenna [name] Date of Service: 04/01/2021 1:30 PM Medical Record Number: [id number] Patient Account Number: [id number] Date of Birth/Sex: January 26, 1931 (87 y.o. F) Treating RN: Carlene Coria Primary Care Kylen Ismael: [name], Dibas Other Clinician: Jeanine Luz Referring Amilio Zehnder: [name], Dibas Treating Jeanni Allshouse/Extender: Skipper Cliche in Treatment: 1 Edema Assessment Assessed: [Left: Yes] [Right: No] Edema: [Left: Ye] [Right: s] Calf Left: Right: Point of Measurement: 30 cm From Medial Instep 40.9 cm Ankle Left: Right: Point of Measurement: 10 cm From Medial Instep 23 cm Vascular Assessment Pulses: Dorsalis Pedis Palpable: [Left:Yes] Electronic Signature(s) Signed: 04/01/2021 4:17:01 PM By: Jeanine Luz Signed: 04/05/2021 8:10:37 AM By: Carlene Coria RN Entered By: Jeanine Luz on 04/01/2021 13:40:02 Jenna [name] ([id number]) -------------------------------------------------------------------------------- Multi Wound Chart Details Patient Name: Jenna [name] Date of Service: 04/01/2021 1:30 PM Medical Record Number: [id number] Patient Account Number: [id number] Date of Birth/Sex: 08-05-1931 (87 y.o. F) Treating RN: Carlene Coria Primary Care Elias Bordner: [name], Dibas Other Clinician: Jeanine Luz Referring Larna Capelle: [name], Dibas Treating Evonna Stoltz/Extender: Skipper Cliche in Treatment: 1 Vital Signs Height(in): 60 Pulse(bpm): 10 Weight(lbs): Blood Pressure(mmHg): 176/64 Body Mass Index(BMI): Temperature(F): 98.0 Respiratory Rate(breaths/min): 16 Photos: [N/A:N/A] Wound Location: Left, Medial Lower Leg N/A N/A Wounding Event: Gradually Appeared N/A N/A Primary Etiology: Lymphedema N/A N/A Comorbid History: Cataracts, Glaucoma, Lymphedema, N/A N/A Hypertension, Type II Diabetes, Osteoarthritis Date  Acquired: 03/04/2021 N/A N/A Weeks of Treatment: 1 N/A N/A Wound Status: Open N/A N/A Measurements L x W x D (cm) 0.9x1x0.1 N/A N/A Area (cm) : 0.707 N/A N/A Volume (cm) : 0.071 N/A N/A % Reduction in Area: 0.00% N/A N/A % Reduction in Volume: 0.00% N/A N/A Classification: Partial Thickness N/A N/A Exudate Amount: Small N/A N/A Exudate Type: Serous N/A N/A Exudate Color: amber N/A N/A Wound Margin: Flat and Intact N/A N/A Granulation Amount: Large (67-100%) N/A N/A Granulation Quality: Pink, Pale N/A N/A Necrotic Amount: None Present (0%) N/A N/A Exposed Structures: Fat Layer (Subcutaneous Tissue): N/A N/A Yes Fascia: No Tendon: No Muscle: No Joint: No Bone: No Treatment Notes Electronic Signature(s) Signed: 04/05/2021 8:10:37 AM By: Carlene Coria RN Entered By: Carlene Coria on 04/01/2021 14:24:52 Jenna [name] ([id number]) -------------------------------------------------------------------------------- Union Details Patient Name: Jenna [name] Date of Service: 04/01/2021 1:30 PM Medical Record Number: [id number] Patient  Instruction: Cover with ABD pad Secured With Compression Wrap Profore LF 4 Multi-Layer Compression Bandaging System Discharge Instruction: Apply 4 multi-layer wrap as directed. Compression Stockings Add-Ons Electronic Signature(s) Signed: 04/01/2021 4:17:01 PM By: Jeanine Luz Signed: 04/05/2021 8:10:37 AM By: Carlene Coria RN Entered By: Jeanine Luz on 04/01/2021 13:38:41 Jenna [name], Jenna [name] ([id number]) -------------------------------------------------------------------------------- Vitals Details Patient Name: Jenna [name] Date of Service: 04/01/2021 1:30 PM Medical Record Number: [id number] Patient Account Number: [id number] Date of Birth/Sex: Jun 26, 1931 (87 y.o. F) Treating RN: Carlene Coria Primary Care Derrin Currey: [name], Dibas Other Clinician: Jeanine Luz Referring Anyah Swallow: [name], Dibas Treating Walda Hertzog/Extender: Skipper Cliche in Treatment: 1 Vital Signs Time Taken: 13:30 Temperature (F): 98.0 Height (in): 60 Pulse (bpm): 74 Respiratory Rate (breaths/min): 16 Blood Pressure (mmHg): 176/64 Reference Range: 80 - 120 mg / dl Electronic Signature(s) Signed: 04/01/2021 4:17:01 PM By: Jeanine Luz Entered By: Jeanine Luz on 04/01/2021 13:30:23

## 2021-04-01 NOTE — Progress Notes (Addendum)
ALFIE, ALDERFER ([id number]) Visit Report for 04/01/2021 Chief Complaint Document Details Patient Name: Jenna [name], Jenna [name]. Date of Service: 04/01/2021 1:30 PM Medical Record Number: [id number] Patient Account Number: [id number] Date of Birth/Sex: 1931/01/26 (87 y.o. F) Treating RN: Carlene Coria Primary Care Provider: [name], Dibas Other Clinician: Jeanine Luz Referring Provider: [name], Dibas Treating Provider/Extender: Skipper Cliche in Treatment: 1 Information Obtained from: Patient Chief Complaint Left LE Ulcer Electronic Signature(s) Signed: 04/01/2021 1:26:17 PM By: Worthy Keeler PA-C Entered By: Worthy Keeler on 04/01/2021 13:26:16 Jenna [name] ([id number]) -------------------------------------------------------------------------------- HPI Details Patient Name: Jenna [name] Date of Service: 04/01/2021 1:30 PM Medical Record Number: [id number] Patient Account Number: [id number] Date of Birth/Sex: 13-Aug-1931 (87 y.o. F) Treating RN: Carlene Coria Primary Care Provider: [name], Dibas Other Clinician: Jeanine Luz Referring Provider: [name], Dibas Treating Provider/Extender: Skipper Cliche in Treatment: 1 History of Present Illness Location: left lower extremity in the medial ankle region Quality: Patient reports experiencing a dull pain to affected area(s). Severity: Patient states wound are getting worse. Duration: Patient has had the wound for > 3 months prior to seeking treatment at the wound center Timing: Pain in wound is Intermittent (comes and goes Context: The wound appeared gradually over time Modifying Factors: Other treatment(s) tried include:[name] Associated Signs and Symptoms: Patient reports having increase swelling. HPI Description: 87 year old patient with a past medical history significant for venous stasis ulceration and diabetes mellitus was recently admitted to the hospital  between 07/07/2016 and 07/09/2016. She was wound to have a nonpurulent cellulitis of the left lower extremity and was started on IV antibiotics which included vancomycin. He is discharged home with her and Unna's boots and oral doxycycline. During this admission there was no obvious DVT involving the left lower extremity. her past medical history significant for diabetes mellitus, hypertension, hyperlipidemia, varicose veins, status post right rotator cuff repair, knee surgery on the left,robotic abdominal hysterectomy, laparoscopic cholecystectomy. She is not a smoker. Venous study done on 07/18/2015 showed normal left extremity deep venous system with no evidence of DVT. There was extensive valvular incompetence and reflux throughout the left greater saphenous vein with direct the medication to subcutaneous varicose veins. Normal left small saphenous vein. I understand she was seen by vascular radiology group and the workup and recommendation was for endovenous ablation but due to family pressure she was not able to keep her appointment a year ago. She has not been wearing her compression stockings regularly. 05/26/18 READMISSION this is an 87 year old woman who was previously seen in this clinic in 2017 by Dr. Con Memos. She has chronic venous insufficiency with lymphedema and at that time had open area on the left medial calf and ankle area. Was recommended that she wear 20-30 mm compression stockings and the patient tells me that she has been compliant with this although I think her primary doctor recently told her that she didn't need to wear stockings out of fear it might cause arterial compression. She noticed edema and pain in the area a week to 2 ago. She saw her primary doctor and was given doxycycline and Silvadene cream to put on the area. She states things are a lot better. The patient has a history of type 2 diabetes on oral agents but she is unaware of her hemoglobin A1c for her blood  glucose which she doesn't check. She has hypertension, varicose veins, rotator cuff repair, knee surgery on the left, history of a laparoscopic cholecystectomy, lymphedema,  obstructive sleep apnea, history of endometrial CA. The patient has been to see vein and vascular in the past and I think has had an ablation of the left greater saphenous vein based on ultrasounds of the left leg IC dating back to 2017. Her most recent ultrasound was in May 2018 which showed no evidence of a DVT and continued durable closure of the treated segment of the left greater saphenous vein. Patent lateral accessory branch of the greater saphenous vein extending to the calf ABIs in our clinic were 1.05 on the right and 0.95 on the left 06/02/18 the patient's wound on the left medial lower calf is completely closed and epithelialized. She has new 20-30 mm below-knee stockings READMISSION 11/02/2019 This is a now 87 year old woman who has been in this clinic 2 times before. Most recently in 2019. She has chronic venous insufficiency with some degree of secondary lymphedema. She has had a history of a left greater saphenous vein ablation. When she is here in 2019 she had a wound on the left medial lower calf. This closed fairly easily. It was recommended she wear 20/30 mm below-knee stockings she is not compliant with this. She arrives in clinic with a 2-week history of a left medial lower extremity and ankle wound. Some of this has dry slough on it but most of it is already epithelialize she has been using a combination of Vaseline and/or Silvadene. She is not wearing any compression. She has a history of chronic venous reflux. There was recommendations in the past for ablations I do not know that she ever carried through with this. According to notes from 2016 this work-up was done via the interventional radiology group. We will need to research this. She is probably going to need a consultation ABIs in our clinic were  1.03 on the right and 0.93 on the left 11/11; patient's wound looks as though it is epithelializing horizontal wound on the left medial lower extremity. She has a superior satellite lesion. She is complaining of a lot of pain in 3 layer compression. There have been recommendations for previous ablations I am not sure if she followed up on this. She has been noncompliant with stockings although she brought those into the clinic today. 11/18; the patient had a repeat venous reflux studies at vein and vascular. This did not show any DVT in the left lower extremity there was no evidence of chronic venous insufficiency and no evidence of superficial vein thrombosis. I looked back at previous studies done I think in interventional radiology in 2018 would suggest that there had been previous ablation of a segment of the left greater saphenous vein. By review of the new study I do not think the patient needs to see vein and vascular however I find these studies increasingly difficult to interpret. I am not sure that the patient has actually consistently worn compression stockings and I think that is the next step here. The wound is just about closed DAIJA, ROUTSON ([id number]) 11/22/2019 upon evaluation today patient actually appears to be healed based on what I am seeing today. She has been tolerating the dressing changes without complication. Fortunately there is no signs of active infection at this time. Overall I feel like even though this is the first time of seeing her that she has actually done extremely well with the current wound care measures again she has been under the care of Dr. Dellia Nims. 12/2 the patient was expected to be healed this week however she  comes in with 3 small wounds that look much the same as the pictures from 2 weeks ago. These are all in the area of the left medial lower extremity. She was put into her own compression stockings last week I do not think the edema control was  adequate in this area for healing. We have been using Hydrofera Blue 12/9; wound is fully epithelialized but still looks vulnerable on the left medial lower extremity. Surrounding venous inflammation. I think she has lymphedema with fibrosed skin in the distal lower leg. [Inverted bottle sign]. She has had venous reflux studies that have not shown any of the superficial veins to be amenable to ablations. This is been repeated during this visit. I am not really convinced that she has been wearing compression stockings although she certainly has them. 12/23; patient's wound on the left medial lower extremity is totally healed. She has surrounding venous inflammation and skin damage related to there is also some degree of lymphedema and fibrosed skin in the distal lower extremity. She has her compression stocking Readmission: 03/25/2021 upon evaluation today patient appears to be doing excellent in regard to her wound all things considered. She does appear to have gotten very quickly this time which is great news compared to before when she tells me she let the wound get somewhat out of control. Nonetheless right now she tells me this has been present for about 3 weeks she has been using Vaseline on it. She does have a history of diabetes though she has not taken Metformin for years she sees her primary care provider on Friday and she will see what he says but she is hoping he would not put her back on this. Subsequently she also does have Lasix that she is not been taking it recently. She has a past medical history significant for venous stasis, lymphedema, diabetes mellitus type 2, and hypertension. 04/01/2021 on evaluation today patient appears to be doing well with regard to her wound. There is no signs of active infection at this time. No fevers, chills, nausea, vomiting, or diarrhea. Electronic Signature(s) Signed: 04/01/2021 2:30:01 PM By: Worthy Keeler PA-C Entered By: Worthy Keeler on  04/01/2021 14:30:01 Jenna [name], Jenna [name] ([id number]) -------------------------------------------------------------------------------- Physical Exam Details Patient Name: Jenna [name] Date of Service: 04/01/2021 1:30 PM Medical Record Number: [id number] Patient Account Number: [id number] Date of Birth/Sex: May 09, 1931 (87 y.o. F) Treating RN: Carlene Coria Primary Care Provider: [name], Dibas Other Clinician: Jeanine Luz Referring Provider: [name], Dibas Treating Provider/Extender: Skipper Cliche in Treatment: 1 Constitutional Well-nourished and well-hydrated in no acute distress. Respiratory normal breathing without difficulty. Psychiatric this patient is able to make decisions and demonstrates good insight into disease process. Alert and Oriented x 3. pleasant and cooperative. Notes Upon inspection patient's wound bed showed signs of almost complete epithelization at this point. There does not appear to be any signs of active infection which is great news and overall very pleased with where things stand at this point. Electronic Signature(s) Signed: 04/01/2021 2:30:40 PM By: Worthy Keeler PA-C Entered By: Worthy Keeler on 04/01/2021 14:30:40 Jenna [name] ([id number]) -------------------------------------------------------------------------------- Physician Orders Details Patient Name: Jenna [name] Date of Service: 04/01/2021 1:30 PM Medical Record Number: [id number] Patient Account Number: [id number] Date of Birth/Sex: May 16, 1931 (87 y.o. F) Treating RN: Carlene Coria Primary Care Provider: [name], Dibas Other Clinician: Jeanine Luz Referring Provider: [name], Dibas Treating Provider/Extender: Skipper Cliche in Treatment: 1 Verbal / Phone Orders: No Diagnosis Coding ICD-10 Coding Code  Description I87.2 Venous insufficiency (chronic) (peripheral) I89.0 Lymphedema, not elsewhere classified L97.821 Non-pressure chronic ulcer of other part of left  lower leg limited to breakdown of skin E11.622 Type 2 diabetes mellitus with other skin ulcer I10 Essential (primary) hypertension Follow-up Appointments o Return Appointment in 1 week. Bathing/ Shower/ Hygiene o May shower with wound dressing protected with water repellent cover or cast protector. Edema Control - Lymphedema / Segmental Compressive Device / Other o Optional: One layer of unna paste to top of compression wrap (to act as an anchor). o Elevate, Exercise Daily and Avoid Standing for Long Periods of Time. o Elevate legs to the level of the heart and pump ankles as often as possible o Elevate leg(s) parallel to the floor when sitting. Wound Treatment Wound #4 - Lower Leg Wound Laterality: Left, Medial Cleanser: Soap and Water (Generic) 1 x Per Week/30 Days Discharge Instructions: Gently cleanse wound with antibacterial soap, rinse and pat dry prior to dressing wounds Peri-Wound Care: Triamcinolone Acetonide Cream, 0.1%, 15 (g) tube 1 x Per Week/30 Days Discharge Instructions: apply to lower leg Primary Dressing: Curad Oil Emulsion Dressing 3x3 (in/in) 1 x Per Week/30 Days Secondary Dressing: ABD Pad 5x9 (in/in) (Generic) 1 x Per Week/30 Days Discharge Instructions: Cover with ABD pad Compression Wrap: Profore LF 4 Multi-Layer Compression Bandaging System (Generic) 1 x Per Week/30 Days Discharge Instructions: Apply 4 multi-layer wrap as directed. Electronic Signature(s) Signed: 04/01/2021 5:03:26 PM By: Worthy Keeler PA-C Signed: 04/05/2021 8:10:37 AM By: Carlene Coria RN Entered By: Carlene Coria on 04/01/2021 14:29:29 Jenna [name] ([id number]) -------------------------------------------------------------------------------- Problem List Details Patient Name: LYRICAL, SOWLE. Date of Service: 04/01/2021 1:30 PM Medical Record Number: [id number] Patient Account Number: [id number] Date of Birth/Sex: Dec 19, 1931 (87 y.o. F) Treating RN: Carlene Coria Primary Care  Provider: [name], Dibas Other Clinician: Jeanine Luz Referring Provider: [name], Dibas Treating Provider/Extender: Skipper Cliche in Treatment: 1 Active Problems ICD-10 Encounter Code Description Active Date MDM Diagnosis I87.2 Venous insufficiency (chronic) (peripheral) 03/25/2021 No Yes I89.0 Lymphedema, not elsewhere classified 03/25/2021 No Yes L97.821 Non-pressure chronic ulcer of other part of left lower leg limited to 03/25/2021 No Yes breakdown of skin E11.622 Type 2 diabetes mellitus with other skin ulcer 03/25/2021 No Yes I10 Essential (primary) hypertension 03/25/2021 No Yes Inactive Problems Resolved Problems Electronic Signature(s) Signed: 04/01/2021 1:26:10 PM By: Worthy Keeler PA-C Entered By: Worthy Keeler on 04/01/2021 13:26:10 Jenna [name] ([id number]) -------------------------------------------------------------------------------- Progress Note Details Patient Name: Jenna [name] Date of Service: 04/01/2021 1:30 PM Medical Record Number: [id number] Patient Account Number: [id number] Date of Birth/Sex: Feb 19, 1931 (87 y.o. F) Treating RN: Carlene Coria Primary Care Provider: [name], Dibas Other Clinician: Jeanine Luz Referring Provider: [name], Dibas Treating Provider/Extender: Skipper Cliche in Treatment: 1 Subjective Chief Complaint Information obtained from Patient Left LE Ulcer History of Present Illness (HPI) The following HPI elements were documented for the patient's wound: Location: left lower extremity in the medial ankle region Quality: Patient reports experiencing a dull pain to affected area(s). Severity: Patient states wound are getting worse. Duration: Patient has had the wound for > 3 months prior to seeking treatment at the wound center Timing: Pain in wound is Intermittent (comes and goes Context: The wound appeared gradually over time Modifying Factors: Other treatment(s) tried include:[name] to hospital  for IV antibiotics for cellulitis Associated Signs and Symptoms: Patient reports having increase swelling. 87 year old patient with a past medical history significant for venous stasis ulceration and diabetes mellitus was recently admitted  to the hospital between 07/07/2016 and 07/09/2016. She was wound to have a nonpurulent cellulitis of the left lower extremity and was started on IV antibiotics which included vancomycin. He is discharged home with her and Unna's boots and oral doxycycline. During this admission there was no obvious DVT involving the left lower extremity. her past medical history significant for diabetes mellitus, hypertension, hyperlipidemia, varicose veins, status post right rotator cuff repair, knee surgery on the left,robotic abdominal hysterectomy, laparoscopic cholecystectomy. She is not a smoker. Venous study done on 07/18/2015 showed normal left extremity deep venous system with no evidence of DVT. There was extensive valvular incompetence and reflux throughout the left greater saphenous vein with direct the medication to subcutaneous varicose veins. Normal left small saphenous vein. I understand she was seen by vascular radiology group and the workup and recommendation was for endovenous ablation but due to family pressure she was not able to keep her appointment a year ago. She has not been wearing her compression stockings regularly. 05/26/18 READMISSION this is an 87 year old woman who was previously seen in this clinic in 2017 by Dr. Con Memos. She has chronic venous insufficiency with lymphedema and at that time had open area on the left medial calf and ankle area. Was recommended that she wear 20-30 mm compression stockings and the patient tells me that she has been compliant with this although I think her primary doctor recently told her that she didn't need to wear stockings out of fear it might cause arterial compression. She noticed edema and pain in the area a week  to 2 ago. She saw her primary doctor and was given doxycycline and Silvadene cream to put on the area. She states things are a lot better. The patient has a history of type 2 diabetes on oral agents but she is unaware of her hemoglobin A1c for her blood glucose which she doesn't check. She has hypertension, varicose veins, rotator cuff repair, knee surgery on the left, history of a laparoscopic cholecystectomy, lymphedema, obstructive sleep apnea, history of endometrial CA. The patient has been to see vein and vascular in the past and I think has had an ablation of the left greater saphenous vein based on ultrasounds of the left leg IC dating back to 2017. Her most recent ultrasound was in May 2018 which showed no evidence of a DVT and continued durable closure of the treated segment of the left greater saphenous vein. Patent lateral accessory branch of the greater saphenous vein extending to the calf ABIs in our clinic were 1.05 on the right and 0.95 on the left 06/02/18 the patient's wound on the left medial lower calf is completely closed and epithelialized. She has new 20-30 mm below-knee stockings READMISSION 11/02/2019 This is a now 87 year old woman who has been in this clinic 2 times before. Most recently in 2019. She has chronic venous insufficiency with some degree of secondary lymphedema. She has had a history of a left greater saphenous vein ablation. When she is here in 2019 she had a wound on the left medial lower calf. This closed fairly easily. It was recommended she wear 20/30 mm below-knee stockings she is not compliant with this. She arrives in clinic with a 2-week history of a left medial lower extremity and ankle wound. Some of this has dry slough on it but most of it is already epithelialize she has been using a combination of Vaseline and/or Silvadene. She is not wearing any compression. She has a history of chronic venous reflux.  There was recommendations in the past for  ablations I do not know that she ever carried through with this. According to notes from 2016 this work-up was done via the interventional radiology group. We will need to research this. She is probably going to need a consultation ABIs in our clinic were 1.03 on the right and 0.93 on the left 11/11; patient's wound looks as though it is epithelializing horizontal wound on the left medial lower extremity. She has a superior satellite lesion. Jenna [name], Jenna [name] ([id number]) She is complaining of a lot of pain in 3 layer compression. There have been recommendations for previous ablations I am not sure if she followed up on this. She has been noncompliant with stockings although she brought those into the clinic today. 11/18; the patient had a repeat venous reflux studies at vein and vascular. This did not show any DVT in the left lower extremity there was no evidence of chronic venous insufficiency and no evidence of superficial vein thrombosis. I looked back at previous studies done I think in interventional radiology in 2018 would suggest that there had been previous ablation of a segment of the left greater saphenous vein. By review of the new study I do not think the patient needs to see vein and vascular however I find these studies increasingly difficult to interpret. I am not sure that the patient has actually consistently worn compression stockings and I think that is the next step here. The wound is just about closed 11/22/2019 upon evaluation today patient actually appears to be healed based on what I am seeing today. She has been tolerating the dressing changes without complication. Fortunately there is no signs of active infection at this time. Overall I feel like even though this is the first time of seeing her that she has actually done extremely well with the current wound care measures again she has been under the care of Dr. Dellia Nims. 12/2 the patient was expected to be healed this week  however she comes in with 3 small wounds that look much the same as the pictures from 2 weeks ago. These are all in the area of the left medial lower extremity. She was put into her own compression stockings last week I do not think the edema control was adequate in this area for healing. We have been using Hydrofera Blue 12/9; wound is fully epithelialized but still looks vulnerable on the left medial lower extremity. Surrounding venous inflammation. I think she has lymphedema with fibrosed skin in the distal lower leg. [Inverted bottle sign]. She has had venous reflux studies that have not shown any of the superficial veins to be amenable to ablations. This is been repeated during this visit. I am not really convinced that she has been wearing compression stockings although she certainly has them. 12/23; patient's wound on the left medial lower extremity is totally healed. She has surrounding venous inflammation and skin damage related to there is also some degree of lymphedema and fibrosed skin in the distal lower extremity. She has her compression stocking Readmission: 03/25/2021 upon evaluation today patient appears to be doing excellent in regard to her wound all things considered. She does appear to have gotten very quickly this time which is great news compared to before when she tells me she let the wound get somewhat out of control. Nonetheless right now she tells me this has been present for about 3 weeks she has been using Vaseline on it. She does have a history of  diabetes though she has not taken Metformin for years she sees her primary care provider on Friday and she will see what he says but she is hoping he would not put her back on this. Subsequently she also does have Lasix that she is not been taking it recently. She has a past medical history significant for venous stasis, lymphedema, diabetes mellitus type 2, and hypertension. 04/01/2021 on evaluation today patient appears to be  doing well with regard to her wound. There is no signs of active infection at this time. No fevers, chills, nausea, vomiting, or diarrhea. Objective Constitutional Well-nourished and well-hydrated in no acute distress. Vitals Time Taken: 1:30 PM, Height: 60 in, Temperature: 98.0 F, Pulse: 74 bpm, Respiratory Rate: 16 breaths/min, Blood Pressure: 176/64 mmHg. Respiratory normal breathing without difficulty. Psychiatric this patient is able to make decisions and demonstrates good insight into disease process. Alert and Oriented x 3. pleasant and cooperative. General Notes: Upon inspection patient's wound bed showed signs of almost complete epithelization at this point. There does not appear to be any signs of active infection which is great news and overall very pleased with where things stand at this point. Integumentary (Hair, Skin) Wound #4 status is Open. Original cause of wound was Gradually Appeared. The date acquired was: 03/04/2021. The wound has been in treatment 1 weeks. The wound is located on the Left,Medial Lower Leg. The wound measures 0.9cm length x 1cm width x 0.1cm depth; 0.707cm^2 area and 0.071cm^3 volume. There is Fat Layer (Subcutaneous Tissue) exposed. There is no tunneling or undermining noted. There is a small amount of serous drainage noted. The wound margin is flat and intact. There is large (67-100%) pink, pale granulation within the wound bed. There is no necrotic tissue within the wound bed. Assessment Active Problems ICD-10 Venous insufficiency (chronic) (peripheral) Lymphedema, not elsewhere classified Jenna [name], Jenna [name]. 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 There was a Four Layer Compression  Therapy Procedure by Carlene Coria, RN. Post procedure Diagnosis Wound #4: Same as Pre-Procedure Plan Follow-up Appointments: Return Appointment in 1 week. Bathing/ Shower/ Hygiene: May shower with wound dressing protected with water repellent cover or cast protector. Edema Control - Lymphedema / Segmental Compressive Device / Other: Optional: One layer of unna paste to top of compression wrap (to act as an anchor). Elevate, Exercise Daily and Avoid Standing for Long Periods of Time. Elevate legs to the level of the heart and pump ankles as often as possible Elevate leg(s) parallel to the floor when sitting. WOUND #4: - Lower Leg Wound Laterality: Left, Medial Cleanser: Soap and Water (Generic) 1 x Per Week/30 Days Discharge Instructions: Gently cleanse wound with antibacterial soap, rinse and pat dry prior to dressing wounds Peri-Wound Care: Triamcinolone Acetonide Cream, 0.1%, 15 (g) tube 1 x Per Week/30 Days Discharge Instructions: apply to lower leg Primary Dressing: Curad Oil Emulsion Dressing 3x3 (in/in) 1 x Per Week/30 Days Secondary Dressing: ABD Pad 5x9 (in/in) (Generic) 1 x Per Week/30 Days Discharge Instructions: Cover with ABD pad Compression Wrap: Profore LF 4 Multi-Layer Compression Bandaging System (Generic) 1 x Per Week/30 Days Discharge Instructions: Apply 4 multi-layer wrap as directed. 1. Would recommend currently that we initiate treatment with a continuation of the compression wrap. Were using a 4-layer compression wrap currently  which I think is doing a great job. We will also get a continue with the triamcinolone to the leg to help with any itching and then just use an oil emulsion dressing over the pretty much healed location where the wound was. There is just a small opening at this point will do this then followed by the ABD pad and see where things stand in 1 week I expect her to be discharged next week. We will see patient back for reevaluation in 1 week here in  the clinic. If anything worsens or changes patient will contact our office for additional recommendations. Electronic Signature(s) Signed: 04/01/2021 2:31:11 PM By: Worthy Keeler PA-C Entered By: Worthy Keeler on 04/01/2021 14:31:11 Fant, [name] ([id number]) -------------------------------------------------------------------------------- SuperBill Details Patient Name: Jenna [name] Date of Service: 04/01/2021 Medical Record Number: [id number] Patient Account Number: [id number] Date of Birth/Sex: 18-Nov-1931 (87 y.o. F) Treating RN: Carlene Coria Primary Care Provider: [name], [place] Other Clinician: Jeanine Luz Referring Provider: [name], [place] Treating Provider/Extender: Skipper Cliche in Treatment: 1 Diagnosis Coding ICD-10 Codes Code Description I87.2 Venous insufficiency (chronic) (peripheral) I89.0 Lymphedema, not elsewhere classified L97.821 Non-pressure chronic ulcer of other part of left lower leg limited to breakdown of skin E11.622 Type 2 diabetes mellitus with other skin ulcer I10 Essential (primary) hypertension Facility Procedures CPT4 Code: [id number] Description: (Facility Use Only) [phone] - [place] LWR LT LEG Modifier: Quantity: 1 Physician Procedures CPT4 Code: [id number] Description: 01655 - WC PHYS LEVEL 3 - EST PT Modifier: Quantity: 1 CPT4 Code: Description: ICD-10 Diagnosis Description I87.2 Venous insufficiency (chronic) (peripheral) I89.0 Lymphedema, not elsewhere classified L97.821 Non-pressure chronic ulcer of other part of left lower leg limited to bre E11.622 Type 2 diabetes mellitus with  other skin ulcer Modifier: akdown of skin Quantity: Electronic Signature(s) Signed: 04/01/2021 2:31:23 PM By: Worthy Keeler PA-C Entered By: Worthy Keeler on 04/01/2021 14:31:23

## 2021-04-08 ENCOUNTER — Other Ambulatory Visit: Payer: Self-pay

## 2021-04-08 ENCOUNTER — Encounter: Payer: Medicare Other | Admitting: Physician Assistant

## 2021-04-08 DIAGNOSIS — E11622 Type 2 diabetes mellitus with other skin ulcer: Secondary | ICD-10-CM | POA: Diagnosis not present

## 2021-04-08 DIAGNOSIS — L97821 Non-pressure chronic ulcer of other part of left lower leg limited to breakdown of skin: Secondary | ICD-10-CM | POA: Diagnosis not present

## 2021-04-08 DIAGNOSIS — I890 Lymphedema, not elsewhere classified: Secondary | ICD-10-CM | POA: Diagnosis not present

## 2021-04-08 DIAGNOSIS — I872 Venous insufficiency (chronic) (peripheral): Secondary | ICD-10-CM | POA: Diagnosis not present

## 2021-04-08 DIAGNOSIS — I10 Essential (primary) hypertension: Secondary | ICD-10-CM | POA: Diagnosis not present

## 2021-04-08 NOTE — Progress Notes (Addendum)
JALECIA, LEON ([id number]) Visit Report for 04/08/2021 Chief Complaint Document Details Patient Name: Jenna [name], Jenna [name]. Date of Service: 04/08/2021 2:45 PM Medical Record Number: [id number] Patient Account Number: [id number] Date of Birth/Sex: January 21, 1931 (87 y.o. F) Treating RN: Carlene Coria Primary Care Provider: [name], Dibas Other Clinician: Jeanine Luz Referring Provider: [name], Dibas Treating Provider/Extender: Skipper Cliche in Treatment: 2 Information Obtained from: Patient Chief Complaint Left LE Ulcer Electronic Signature(s) Signed: 04/08/2021 2:46:58 PM By: Worthy Keeler PA-C Entered By: Worthy Keeler on 04/08/2021 14:46:57 Iovino, [name] ([id number]) -------------------------------------------------------------------------------- HPI Details Patient Name: Jenna [name] Date of Service: 04/08/2021 2:45 PM Medical Record Number: [id number] Patient Account Number: [id number] Date of Birth/Sex: 1930/12/31 (87 y.o. F) Treating RN: Carlene Coria Primary Care Provider: [name], Dibas Other Clinician: Jeanine Luz Referring Provider: [name], Dibas Treating Provider/Extender: Skipper Cliche in Treatment: 2 History of Present Illness Location: left lower extremity in the medial ankle region Quality: Patient reports experiencing a dull pain to affected area(s). Severity: Patient states wound are getting worse. Duration: Patient has had the wound for > 3 months prior to seeking treatment at the wound center Timing: Pain in wound is Intermittent (comes and goes Context: The wound appeared gradually over time Modifying Factors: Other treatment(s) tried include:[name] Associated Signs and Symptoms: Patient reports having increase swelling. HPI Description: 87 year old patient with a past medical history significant for venous stasis ulceration and diabetes mellitus was recently admitted to the  hospital between 07/07/2016 and 07/09/2016. She was wound to have a nonpurulent cellulitis of the left lower extremity and was started on IV antibiotics which included vancomycin. He is discharged home with her and Unna's boots and oral doxycycline. During this admission there was no obvious DVT involving the left lower extremity. her past medical history significant for diabetes mellitus, hypertension, hyperlipidemia, varicose veins, status post right rotator cuff repair, knee surgery on the left,robotic abdominal hysterectomy, laparoscopic cholecystectomy. She is not a smoker. Venous study done on 07/18/2015 showed normal left extremity deep venous system with no evidence of DVT. There was extensive valvular incompetence and reflux throughout the left greater saphenous vein with direct the medication to subcutaneous varicose veins. Normal left small saphenous vein. I understand she was seen by vascular radiology group and the workup and recommendation was for endovenous ablation but due to family pressure she was not able to keep her appointment a year ago. She has not been wearing her compression stockings regularly. 05/26/18 READMISSION this is an 87 year old woman who was previously seen in this clinic in 2017 by Dr. Con Memos. She has chronic venous insufficiency with lymphedema and at that time had open area on the left medial calf and ankle area. Was recommended that she wear 20-30 mm compression stockings and the patient tells me that she has been compliant with this although I think her primary doctor recently told her that she didn't need to wear stockings out of fear it might cause arterial compression. She noticed edema and pain in the area a week to 2 ago. She saw her primary doctor and was given doxycycline and Silvadene cream to put on the area. She states things are a lot better. The patient has a history of type 2 diabetes on oral agents but she is unaware of her hemoglobin A1c for her  blood glucose which she doesn't check. She has hypertension, varicose veins, rotator cuff repair, knee surgery on the left, history of a laparoscopic cholecystectomy, lymphedema,  obstructive sleep apnea, history of endometrial CA. The patient has been to see vein and vascular in the past and I think has had an ablation of the left greater saphenous vein based on ultrasounds of the left leg IC dating back to 2017. Her most recent ultrasound was in May 2018 which showed no evidence of a DVT and continued durable closure of the treated segment of the left greater saphenous vein. Patent lateral accessory branch of the greater saphenous vein extending to the calf ABIs in our clinic were 1.05 on the right and 0.95 on the left 06/02/18 the patient's wound on the left medial lower calf is completely closed and epithelialized. She has new 20-30 mm below-knee stockings READMISSION 11/02/2019 This is a now 87 year old woman who has been in this clinic 2 times before. Most recently in 2019. She has chronic venous insufficiency with some degree of secondary lymphedema. She has had a history of a left greater saphenous vein ablation. When she is here in 2019 she had a wound on the left medial lower calf. This closed fairly easily. It was recommended she wear 20/30 mm below-knee stockings she is not compliant with this. She arrives in clinic with a 2-week history of a left medial lower extremity and ankle wound. Some of this has dry slough on it but most of it is already epithelialize she has been using a combination of Vaseline and/or Silvadene. She is not wearing any compression. She has a history of chronic venous reflux. There was recommendations in the past for ablations I do not know that she ever carried through with this. According to notes from 2016 this work-up was done via the interventional radiology group. We will need to research this. She is probably going to need a consultation ABIs in our clinic  were 1.03 on the right and 0.93 on the left 11/11; patient's wound looks as though it is epithelializing horizontal wound on the left medial lower extremity. She has a superior satellite lesion. She is complaining of a lot of pain in 3 layer compression. There have been recommendations for previous ablations I am not sure if she followed up on this. She has been noncompliant with stockings although she brought those into the clinic today. 11/18; the patient had a repeat venous reflux studies at vein and vascular. This did not show any DVT in the left lower extremity there was no evidence of chronic venous insufficiency and no evidence of superficial vein thrombosis. I looked back at previous studies done I think in interventional radiology in 2018 would suggest that there had been previous ablation of a segment of the left greater saphenous vein. By review of the new study I do not think the patient needs to see vein and vascular however I find these studies increasingly difficult to interpret. I am not sure that the patient has actually consistently worn compression stockings and I think that is the next step here. The wound is just about closed MARIPOSA, SHORES ([id number]) 11/22/2019 upon evaluation today patient actually appears to be healed based on what I am seeing today. She has been tolerating the dressing changes without complication. Fortunately there is no signs of active infection at this time. Overall I feel like even though this is the first time of seeing her that she has actually done extremely well with the current wound care measures again she has been under the care of Dr. Dellia Nims. 12/2 the patient was expected to be healed this week however she  comes in with 3 small wounds that look much the same as the pictures from 2 weeks ago. These are all in the area of the left medial lower extremity. She was put into her own compression stockings last week I do not think the edema control  was adequate in this area for healing. We have been using Hydrofera Blue 12/9; wound is fully epithelialized but still looks vulnerable on the left medial lower extremity. Surrounding venous inflammation. I think she has lymphedema with fibrosed skin in the distal lower leg. [Inverted bottle sign]. She has had venous reflux studies that have not shown any of the superficial veins to be amenable to ablations. This is been repeated during this visit. I am not really convinced that she has been wearing compression stockings although she certainly has them. 12/23; patient's wound on the left medial lower extremity is totally healed. She has surrounding venous inflammation and skin damage related to there is also some degree of lymphedema and fibrosed skin in the distal lower extremity. She has her compression stocking Readmission: 03/25/2021 upon evaluation today patient appears to be doing excellent in regard to her wound all things considered. She does appear to have gotten very quickly this time which is great news compared to before when she tells me she let the wound get somewhat out of control. Nonetheless right now she tells me this has been present for about 3 weeks she has been using Vaseline on it. She does have a history of diabetes though she has not taken Metformin for years she sees her primary care provider on Friday and she will see what he says but she is hoping he would not put her back on this. Subsequently she also does have Lasix that she is not been taking it recently. She has a past medical history significant for venous stasis, lymphedema, diabetes mellitus type 2, and hypertension. 04/01/2021 on evaluation today patient appears to be doing well with regard to her wound. There is no signs of active infection at this time. No fevers, chills, nausea, vomiting, or diarrhea. 04/08/2021 upon evaluation today patient appears to be doing excellent in regard to her wounds currently. She has  been tolerating the dressing changes and in fact appears to be completely healed which is great news. Electronic Signature(s) Signed: 04/08/2021 4:04:26 PM By: Worthy Keeler PA-C Entered By: Worthy Keeler on 04/08/2021 16:04:26 Herminia, Warren [name] ([id number]) -------------------------------------------------------------------------------- Physical Exam Details Patient Name: Jenna [name] Date of Service: 04/08/2021 2:45 PM Medical Record Number: [id number] Patient Account Number: [id number] Date of Birth/Sex: 05-Oct-1931 (87 y.o. F) Treating RN: Carlene Coria Primary Care Provider: [name], Dibas Other Clinician: Jeanine Luz Referring Provider: [name], Dibas Treating Provider/Extender: Skipper Cliche in Treatment: 2 Constitutional Obese and well-hydrated in no acute distress. Respiratory normal breathing without difficulty. Psychiatric this patient is able to make decisions and demonstrates good insight into disease process. Alert and Oriented x 3. pleasant and cooperative. Notes Patient's wound bed showed signs of excellent epithelization I do not see anything open currently which is great and overall she has healed quite nicely. Electronic Signature(s) Signed: 04/08/2021 4:04:43 PM By: Worthy Keeler PA-C Entered By: Worthy Keeler on 04/08/2021 16:04:43 Lackey, [name] ([id number]) -------------------------------------------------------------------------------- Physician Orders Details Patient Name: Jenna [name] Date of Service: 04/08/2021 2:45 PM Medical Record Number: [id number] Patient Account Number: [id number] Date of Birth/Sex: 04-23-1931 (87 y.o. F) Treating RN: Carlene Coria Primary Care Provider: [name], Dibas Other Clinician: Jeanine Luz Referring Provider:  Koirala, Dibas Treating Provider/Extender: Skipper Cliche in Treatment: 2 Verbal / Phone Orders: No Diagnosis Coding ICD-10 Coding Code Description I87.2 Venous insufficiency  (chronic) (peripheral) I89.0 Lymphedema, not elsewhere classified L97.821 Non-pressure chronic ulcer of other part of left lower leg limited to breakdown of skin E11.622 Type 2 diabetes mellitus with other skin ulcer I10 Essential (primary) hypertension Discharge From [facility] o Discharge from [facility] Complete - apply compression in the am off in the pm, apply lotion ever evening Electronic Signature(s) Signed: 04/08/2021 4:31:50 PM By: Worthy Keeler PA-C Signed: 04/08/2021 5:13:28 PM By: Carlene Coria RN Entered By: Carlene Coria on 04/08/2021 16:08:04 Jenna [name] ([id number]) -------------------------------------------------------------------------------- Problem List Details Patient Name: Jenna [name] Date of Service: 04/08/2021 2:45 PM Medical Record Number: [id number] Patient Account Number: [id number] Date of Birth/Sex: [birth year] (87 y.o. F) Treating RN: Carlene Coria Primary Care Provider: [name], Dibas Other Clinician: Jeanine Luz Referring Provider: [name], Dibas Treating Provider/Extender: Skipper Cliche in Treatment: 2 Active Problems ICD-10 Encounter Code Description Active Date MDM Diagnosis I87.2 Venous insufficiency (chronic) (peripheral) 03/25/2021 No Yes I89.0 Lymphedema, not elsewhere classified 03/25/2021 No Yes L97.821 Non-pressure chronic ulcer of other part of left lower leg limited to 03/25/2021 No Yes breakdown of skin E11.622 Type 2 diabetes mellitus with other skin ulcer 03/25/2021 No Yes I10 Essential (primary) hypertension 03/25/2021 No Yes Inactive Problems Resolved Problems Electronic Signature(s) Signed: 04/08/2021 2:46:52 PM By: Worthy Keeler PA-C Entered By: Worthy Keeler on 04/08/2021 14:46:51 Soliman, [name] ([id number]) -------------------------------------------------------------------------------- Progress Note Details Patient Name: Jenna [name] Date of Service: 04/08/2021 2:45 PM Medical  Record Number: [id number] Patient Account Number: [id number] Date of Birth/Sex: 22-Sep-1931 (87 y.o. F) Treating RN: Carlene Coria Primary Care Provider: [name], Dibas Other Clinician: Jeanine Luz Referring Provider: [name], Dibas Treating Provider/Extender: Skipper Cliche in Treatment: 2 Subjective Chief Complaint Information obtained from Patient Left LE Ulcer History of Present Illness (HPI) The following HPI elements were documented for the patient's wound: Location: left lower extremity in the medial ankle region Quality: Patient reports experiencing a dull pain to affected area(s). Severity: Patient states wound are getting worse. Duration: Patient has had the wound for > 3 months prior to seeking treatment at the wound center Timing: Pain in wound is Intermittent (comes and goes Context: The wound appeared gradually over time Modifying Factors: Other treatment(s) tried include:[name] Associated Signs and Symptoms: Patient reports having increase swelling. 87 year old patient with a past medical history significant for venous stasis ulceration and diabetes mellitus was recently admitted to the hospital between 07/07/2016 and 07/09/2016. She was wound to have a nonpurulent cellulitis of the left lower extremity and was started on IV antibiotics which included vancomycin. He is discharged home with her and Unna's boots and oral doxycycline. During this admission there was no obvious DVT involving the left lower extremity. her past medical history significant for diabetes mellitus, hypertension, hyperlipidemia, varicose veins, status post right rotator cuff repair, knee surgery on the left,robotic abdominal hysterectomy, laparoscopic cholecystectomy. She is not a smoker. Venous study done on 07/18/2015 showed normal left extremity deep venous system with no evidence of DVT. There was extensive valvular incompetence and  reflux throughout the left greater saphenous vein with direct the medication to subcutaneous varicose veins. Normal left small saphenous vein. I understand she was seen by vascular radiology group and the workup and recommendation was for endovenous ablation but due to family pressure she was  not able to keep her appointment a year ago. She has not been wearing her compression stockings regularly. 05/26/18 READMISSION this is an 87 year old woman who was previously seen in this clinic in 2017 by Dr. Con Memos. She has chronic venous insufficiency with lymphedema and at that time had open area on the left medial calf and ankle area. Was recommended that she wear 20-30 mm compression stockings and the patient tells me that she has been compliant with this although I think her primary doctor recently told her that she didn't need to wear stockings out of fear it might cause arterial compression. She noticed edema and pain in the area a week to 2 ago. She saw her primary doctor and was given doxycycline and Silvadene cream to put on the area. She states things are a lot better. The patient has a history of type 2 diabetes on oral agents but she is unaware of her hemoglobin A1c for her blood glucose which she doesn't check. She has hypertension, varicose veins, rotator cuff repair, knee surgery on the left, history of a laparoscopic cholecystectomy, lymphedema, obstructive sleep apnea, history of endometrial CA. The patient has been to see vein and vascular in the past and I think has had an ablation of the left greater saphenous vein based on ultrasounds of the left leg IC dating back to 2017. Her most recent ultrasound was in May 2018 which showed no evidence of a DVT and continued durable closure of the treated segment of the left greater saphenous vein. Patent lateral accessory branch of the greater saphenous vein extending to the calf ABIs in our clinic were 1.05 on the right and 0.95 on the  left 06/02/18 the patient's wound on the left medial lower calf is completely closed and epithelialized. She has new 20-30 mm below-knee stockings READMISSION 11/02/2019 This is a now 87 year old woman who has been in this clinic 2 times before. Most recently in 2019. She has chronic venous insufficiency with some degree of secondary lymphedema. She has had a history of a left greater saphenous vein ablation. When she is here in 2019 she had a wound on the left medial lower calf. This closed fairly easily. It was recommended she wear 20/30 mm below-knee stockings she is not compliant with this. She arrives in clinic with a 2-week history of a left medial lower extremity and ankle wound. Some of this has dry slough on it but most of it is already epithelialize she has been using a combination of Vaseline and/or Silvadene. She is not wearing any compression. She has a history of chronic venous reflux. There was recommendations in the past for ablations I do not know that she ever carried through with this. According to notes from 2016 this work-up was done via the interventional radiology group. We will need to research this. She is probably going to need a consultation ABIs in our clinic were 1.03 on the right and 0.93 on the left 11/11; patient's wound looks as though it is epithelializing horizontal wound on the left medial lower extremity. She has a superior satellite lesion. ARETHA, LEVI ([id number]) She is complaining of a lot of pain in 3 layer compression. There have been recommendations for previous ablations I am not sure if she followed up on this. She has been noncompliant with stockings although she brought those into the clinic today. 11/18; the patient had a repeat venous reflux studies at vein and vascular. This did not show any DVT in the left lower  extremity there was no evidence of chronic venous insufficiency and no evidence of superficial vein thrombosis. I looked back at  previous studies done I think in interventional radiology in 2018 would suggest that there had been previous ablation of a segment of the left greater saphenous vein. By review of the new study I do not think the patient needs to see vein and vascular however I find these studies increasingly difficult to interpret. I am not sure that the patient has actually consistently worn compression stockings and I think that is the next step here. The wound is just about closed 11/22/2019 upon evaluation today patient actually appears to be healed based on what I am seeing today. She has been tolerating the dressing changes without complication. Fortunately there is no signs of active infection at this time. Overall I feel like even though this is the first time of seeing her that she has actually done extremely well with the current wound care measures again she has been under the care of Dr. Dellia Nims. 12/2 the patient was expected to be healed this week however she comes in with 3 small wounds that look much the same as the pictures from 2 weeks ago. These are all in the area of the left medial lower extremity. She was put into her own compression stockings last week I do not think the edema control was adequate in this area for healing. We have been using Hydrofera Blue 12/9; wound is fully epithelialized but still looks vulnerable on the left medial lower extremity. Surrounding venous inflammation. I think she has lymphedema with fibrosed skin in the distal lower leg. [Inverted bottle sign]. She has had venous reflux studies that have not shown any of the superficial veins to be amenable to ablations. This is been repeated during this visit. I am not really convinced that she has been wearing compression stockings although she certainly has them. 12/23; patient's wound on the left medial lower extremity is totally healed. She has surrounding venous inflammation and skin damage related to there is also some  degree of lymphedema and fibrosed skin in the distal lower extremity. She has her compression stocking Readmission: 03/25/2021 upon evaluation today patient appears to be doing excellent in regard to her wound all things considered. She does appear to have gotten very quickly this time which is great news compared to before when she tells me she let the wound get somewhat out of control. Nonetheless right now she tells me this has been present for about 3 weeks she has been using Vaseline on it. She does have a history of diabetes though she has not taken Metformin for years she sees her primary care provider on Friday and she will see what he says but she is hoping he would not put her back on this. Subsequently she also does have Lasix that she is not been taking it recently. She has a past medical history significant for venous stasis, lymphedema, diabetes mellitus type 2, and hypertension. 04/01/2021 on evaluation today patient appears to be doing well with regard to her wound. There is no signs of active infection at this time. No fevers, chills, nausea, vomiting, or diarrhea. 04/08/2021 upon evaluation today patient appears to be doing excellent in regard to her wounds currently. She has been tolerating the dressing changes and in fact appears to be completely healed which is great news. Objective Constitutional Obese and well-hydrated in no acute distress. Vitals Time Taken: 5:18 AM, Height: 60 in, Temperature: 97.7 F,  Pulse: 89 bpm, Respiratory Rate: 16 breaths/min, Blood Pressure: 184/83 mmHg. Respiratory normal breathing without difficulty. Psychiatric this patient is able to make decisions and demonstrates good insight into disease process. Alert and Oriented x 3. pleasant and cooperative. General Notes: Patient's wound bed showed signs of excellent epithelization I do not see anything open currently which is great and overall she has healed quite nicely. Integumentary (Hair,  Skin) Wound #4 status is Open. Original cause of wound was Gradually Appeared. The date acquired was: 03/04/2021. The wound has been in treatment 2 weeks. The wound is located on the Left,Medial Lower Leg. The wound measures 0cm length x 0cm width x 0cm depth; 0cm^2 area and 0cm^3 volume. There is no tunneling or undermining noted. There is a none present amount of drainage noted. The wound margin is flat and intact. There is no granulation within the wound bed. There is no necrotic tissue within the wound bed. Assessment Active Problems ICD-10 Venous insufficiency (chronic) (peripheral) ANSHIKA, PETHTEL. ([id number]) Lymphedema, not elsewhere classified Non-pressure chronic ulcer of other part of left lower leg limited to breakdown of skin Type 2 diabetes mellitus with other skin ulcer Essential (primary) hypertension Plan Discharge From [facility] Services: Discharge from [facility] Complete - apply compression in the am off in the pm, apply lotion ever evening 1. Would recommend that we go ahead and discontinue wound care services as the patient is completely healed this is excellent news. 2. I am also can recommend that we go ahead and initiate treatment with her compression stocking I think that this is appropriate as well and she does have that with her today. 3. I would also recommend that she elevate her legs much as possible and then at nighttime she should be also put on moisturizer. Do not do that during the day when she first puts on her stocking or to be difficult to get those on. We will see the patient back for follow-up visit as needed. Electronic Signature(s) Signed: 04/08/2021 4:31:17 PM By: Worthy Keeler PA-C Previous Signature: 04/08/2021 4:05:21 PM Version By: Worthy Keeler PA-C Entered By: Worthy Keeler on 04/08/2021 16:31:16 [place], [name] ([id number]) -------------------------------------------------------------------------------- SuperBill  Details Patient Name: Jenna [name] Date of Service: 04/08/2021 Medical Record Number: [id number] Patient Account Number: [id number] Date of Birth/Sex: Aug 26, 1931 (87 y.o. F) Treating RN: Carlene Coria Primary Care Provider: [name], [place] Other Clinician: Jeanine Luz Referring Provider: [name], [place] Treating Provider/Extender: Skipper Cliche in Treatment: 2 Diagnosis Coding ICD-10 Codes Code Description I87.2 Venous insufficiency (chronic) (peripheral) I89.0 Lymphedema, not elsewhere classified L97.821 Non-pressure chronic ulcer of other part of left lower leg limited to breakdown of skin E11.622 Type 2 diabetes mellitus with other skin ulcer I10 Essential (primary) hypertension Facility Procedures CPT4 Code: [id number] Description: [phone] - WOUND CARE VISIT-LEV 2 EST PT Modifier: Quantity: 1 Physician Procedures CPT4 Code: [id number] Description: 76811 - WC PHYS LEVEL 3 - EST PT Modifier: Quantity: 1 CPT4 Code: Description: ICD-10 Diagnosis Description I87.2 Venous insufficiency (chronic) (peripheral) I89.0 Lymphedema, not elsewhere classified L97.821 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

## 2021-04-15 DIAGNOSIS — Z79899 Other long term (current) drug therapy: Secondary | ICD-10-CM | POA: Diagnosis not present

## 2021-06-27 DIAGNOSIS — H401123 Primary open-angle glaucoma, left eye, severe stage: Secondary | ICD-10-CM | POA: Diagnosis not present

## 2021-06-27 DIAGNOSIS — H59032 Cystoid macular edema following cataract surgery, left eye: Secondary | ICD-10-CM | POA: Diagnosis not present

## 2021-06-27 DIAGNOSIS — H43813 Vitreous degeneration, bilateral: Secondary | ICD-10-CM | POA: Diagnosis not present

## 2021-06-27 DIAGNOSIS — H401112 Primary open-angle glaucoma, right eye, moderate stage: Secondary | ICD-10-CM | POA: Diagnosis not present

## 2021-06-27 DIAGNOSIS — H25811 Combined forms of age-related cataract, right eye: Secondary | ICD-10-CM | POA: Diagnosis not present

## 2021-06-28 DIAGNOSIS — Z20822 Contact with and (suspected) exposure to covid-19: Secondary | ICD-10-CM | POA: Diagnosis not present

## 2021-07-12 DIAGNOSIS — Z23 Encounter for immunization: Secondary | ICD-10-CM | POA: Diagnosis not present

## 2021-08-21 DIAGNOSIS — M542 Cervicalgia: Secondary | ICD-10-CM | POA: Diagnosis not present

## 2021-08-22 ENCOUNTER — Encounter: Payer: Medicare Other | Attending: Physician Assistant | Admitting: Physician Assistant

## 2021-08-22 ENCOUNTER — Other Ambulatory Visit: Payer: Self-pay

## 2021-08-22 DIAGNOSIS — L97821 Non-pressure chronic ulcer of other part of left lower leg limited to breakdown of skin: Secondary | ICD-10-CM | POA: Diagnosis not present

## 2021-08-22 DIAGNOSIS — E119 Type 2 diabetes mellitus without complications: Secondary | ICD-10-CM | POA: Insufficient documentation

## 2021-08-22 DIAGNOSIS — L97822 Non-pressure chronic ulcer of other part of left lower leg with fat layer exposed: Secondary | ICD-10-CM | POA: Diagnosis not present

## 2021-08-22 DIAGNOSIS — I890 Lymphedema, not elsewhere classified: Secondary | ICD-10-CM | POA: Diagnosis not present

## 2021-08-22 DIAGNOSIS — I872 Venous insufficiency (chronic) (peripheral): Secondary | ICD-10-CM | POA: Diagnosis not present

## 2021-08-23 NOTE — Progress Notes (Signed)
Jenna, [name] ([id number]) Visit Report for 08/22/2021 Chief Complaint Document Details Patient Name: Jenna [name], Jenna [name]. Date of Service: 08/22/2021 2:00 PM Medical Record Number: [id number] Patient Account Number: [id number] Date of Birth/Sex: 01-13-1931 (87 y.o. F) Treating RN: Cornell Barman Primary Care Provider: [name], Dibas Other Clinician: Referring Provider: Referral, Self Treating Provider/Extender: Skipper Cliche in Treatment: 0 Information Obtained from: Patient Chief Complaint Left LE Ulcer Electronic Signature(s) Signed: 08/22/2021 2:58:39 PM By: Worthy Keeler PA-C Entered By: Worthy Keeler on 08/22/2021 14:58:39 Jenna [name] ([id number]) -------------------------------------------------------------------------------- HPI Details Patient Name: Jenna [name] Date of Service: 08/22/2021 2:00 PM Medical Record Number: [id number] Patient Account Number: [id number] Date of Birth/Sex: Aug 13, 1931 (87 y.o. F) Treating RN: Cornell Barman Primary Care Provider: [name], Dibas Other Clinician: Referring Provider: Referral, Self Treating Provider/Extender: Skipper Cliche in Treatment: 0 History of Present Illness Location: left lower extremity in the medial ankle region Quality: Patient reports experiencing a dull pain to affected area(s). Severity: Patient states wound are getting worse. Duration: Patient has had the wound for > 3 months prior to seeking treatment at the wound center Timing: Pain in wound is Intermittent (comes and goes Context: The wound appeared gradually over time Modifying Factors: Other treatment(s) tried include:[name] Associated Signs and Symptoms: Patient reports having increase swelling. HPI Description: 87 year old patient with a past medical history significant for venous stasis ulceration and diabetes mellitus was recently admitted to the hospital between 07/07/2016 and 07/09/2016.  She was wound to have a nonpurulent cellulitis of the left lower extremity and was started on IV antibiotics which included vancomycin. He is discharged home with her and Unna's boots and oral doxycycline. During this admission there was no obvious DVT involving the left lower extremity. her past medical history significant for diabetes mellitus, hypertension, hyperlipidemia, varicose veins, status post right rotator cuff repair, knee surgery on the left,robotic abdominal hysterectomy, laparoscopic cholecystectomy. She is not a smoker. Venous study done on 07/18/2015 showed normal left extremity deep venous system with no evidence of DVT. There was extensive valvular incompetence and reflux throughout the left greater saphenous vein with direct the medication to subcutaneous varicose veins. Normal left small saphenous vein. I understand she was seen by vascular radiology group and the workup and recommendation was for endovenous ablation but due to family pressure she was not able to keep her appointment a year ago. She has not been wearing her compression stockings regularly. 05/26/18 READMISSION this is an 87 year old woman who was previously seen in this clinic in 2017 by Dr. Con Memos. She has chronic venous insufficiency with lymphedema and at that time had open area on the left medial calf and ankle area. Was recommended that she wear 20-30 mm compression stockings and the patient tells me that she has been compliant with this although I think her primary doctor recently told her that she didn't need to wear stockings out of fear it might cause arterial compression. She noticed edema and pain in the area a week to 2 ago. She saw her primary doctor and was given doxycycline and Silvadene cream to put on the area. She states things are a lot better. The patient has a history of type 2 diabetes on oral agents but she is unaware of her hemoglobin A1c for her blood glucose which she doesn't check. She  has hypertension, varicose veins, rotator cuff repair, knee surgery on the left, history of a laparoscopic cholecystectomy, lymphedema, obstructive sleep apnea, history  of endometrial CA. The patient has been to see vein and vascular in the past and I think has had an ablation of the left greater saphenous vein based on ultrasounds of the left leg IC dating back to 2017. Her most recent ultrasound was in May 2018 which showed no evidence of a DVT and continued durable closure of the treated segment of the left greater saphenous vein. Patent lateral accessory branch of the greater saphenous vein extending to the calf ABIs in our clinic were 1.05 on the right and 0.95 on the left 06/02/18 the patient's wound on the left medial lower calf is completely closed and epithelialized. She has new 20-30 mm below-knee stockings READMISSION 11/02/2019 This is a now 87 year old woman who has been in this clinic 2 times before. Most recently in 2019. She has chronic venous insufficiency with some degree of secondary lymphedema. She has had a history of a left greater saphenous vein ablation. When she is here in 2019 she had a wound on the left medial lower calf. This closed fairly easily. It was recommended she wear 20/30 mm below-knee stockings she is not compliant with this. She arrives in clinic with a 2-week history of a left medial lower extremity and ankle wound. Some of this has dry slough on it but most of it is already epithelialize she has been using a combination of Vaseline and/or Silvadene. She is not wearing any compression. She has a history of chronic venous reflux. There was recommendations in the past for ablations I do not know that she ever carried through with this. According to notes from 2016 this work-up was done via the interventional radiology group. We will need to research this. She is probably going to need a consultation ABIs in our clinic were 1.03 on the right and 0.93 on the  left 11/11; patient's wound looks as though it is epithelializing horizontal wound on the left medial lower extremity. She has a superior satellite lesion. She is complaining of a lot of pain in 3 layer compression. There have been recommendations for previous ablations I am not sure if she followed up on this. She has been noncompliant with stockings although she brought those into the clinic today. 11/18; the patient had a repeat venous reflux studies at vein and vascular. This did not show any DVT in the left lower extremity there was no evidence of chronic venous insufficiency and no evidence of superficial vein thrombosis. I looked back at previous studies done I think in interventional radiology in 2018 would suggest that there had been previous ablation of a segment of the left greater saphenous vein. By review of the new study I do not think the patient needs to see vein and vascular however I find these studies increasingly difficult to interpret. I am not sure that the patient has actually consistently worn compression stockings and I think that is the next step here. The wound is just about closed Jenna [name], Jenna [name] ([id number]) 11/22/2019 upon evaluation today patient actually appears to be healed based on what I am seeing today. She has been tolerating the dressing changes without complication. Fortunately there is no signs of active infection at this time. Overall I feel like even though this is the first time of seeing her that she has actually done extremely well with the current wound care measures again she has been under the care of Dr. Dellia Nims. 12/2 the patient was expected to be healed this week however she comes in with 3  small wounds that look much the same as the pictures from 2 weeks ago. These are all in the area of the left medial lower extremity. She was put into her own compression stockings last week I do not think the edema control was adequate in this area for healing. We  have been using Hydrofera Blue 12/9; wound is fully epithelialized but still looks vulnerable on the left medial lower extremity. Surrounding venous inflammation. I think she has lymphedema with fibrosed skin in the distal lower leg. [Inverted bottle sign]. She has had venous reflux studies that have not shown any of the superficial veins to be amenable to ablations. This is been repeated during this visit. I am not really convinced that she has been wearing compression stockings although she certainly has them. 12/23; patient's wound on the left medial lower extremity is totally healed. She has surrounding venous inflammation and skin damage related to there is also some degree of lymphedema and fibrosed skin in the distal lower extremity. She has her compression stocking Readmission: 03/25/2021 upon evaluation today patient appears to be doing excellent in regard to her wound all things considered. She does appear to have gotten very quickly this time which is great news compared to before when she tells me she let the wound get somewhat out of control. Nonetheless right now she tells me this has been present for about 3 weeks she has been using Vaseline on it. She does have a history of diabetes though she has not taken Metformin for years she sees her primary care provider on Friday and she will see what he says but she is hoping he would not put her back on this. Subsequently she also does have Lasix that she is not been taking it recently. She has a past medical history significant for venous stasis, lymphedema, diabetes mellitus type 2, and hypertension. 04/01/2021 on evaluation today patient appears to be doing well with regard to her wound. There is no signs of active infection at this time. No fevers, chills, nausea, vomiting, or diarrhea. 04/08/2021 upon evaluation today patient appears to be doing excellent in regard to her wounds currently. She has been tolerating the dressing changes and  in fact appears to be completely healed which is great news. Readmission: 08/22/2021 patient presents today for reevaluation here in the clinic concerning a reopening of the wound on the left medial ankle/lower leg region which is the same area I took care of earlier in the year between March and April. Fortunately there does not appear to be anything to do a peer which is good news. I do think that as before this can probably heal fatherly rapidly. Nonetheless I do believe that based on what we are seeing she probably needs a compression wrap at this time. Her medical history really is not changed. Her ABI back in March of this year was 1.28 on this left leg and was doing well. Electronic Signature(s) Signed: 08/22/2021 3:05:46 PM By: Worthy Keeler PA-C Entered By: Worthy Keeler on 08/22/2021 15:05:46 Jenna [name], Jenna [name] ([id number]) -------------------------------------------------------------------------------- Physical Exam Details Patient Name: Jenna [name] Date of Service: 08/22/2021 2:00 PM Medical Record Number: [id number] Patient Account Number: [id number] Date of Birth/Sex: 03/09/1931 (87 y.o. F) Treating RN: Cornell Barman Primary Care Provider: [name], [place]ibas Other Clinician: Referring Provider: Referral, Self Treating Provider/Extender: Skipper Cliche in Treatment: 0 Constitutional patient is hypertensive.. pulse regular and within target range for patient.Marland Kitchen respirations regular, non-labored and within target range for patient.Marland Kitchen temperature  within target range for patient.. Well-nourished and well-hydrated in no acute distress. Eyes conjunctiva clear no eyelid edema noted. pupils equal round and reactive to light and accommodation. Ears, Nose, Mouth, and Throat no gross abnormality of ear auricles or external auditory canals. normal hearing noted during conversation. mucus membranes moist. Respiratory normal breathing without difficulty. Cardiovascular 2+ dorsalis  pedis/posterior tibialis pulses. 1+ pitting edema of the bilateral lower extremities. Musculoskeletal normal gait and posture. no significant deformity or arthritic changes, no loss or range of motion, no clubbing. Psychiatric this patient is able to make decisions and demonstrates good insight into disease process. Alert and Oriented x 3. pleasant and cooperative. Notes Upon inspection patient's wound bed actually showed signs of good granulation epithelization at this point. Fortunately there does not appear to be any signs of active infection at this time which is great news and overall very pleased with where things stand currently. The patient does tell me she did not take her blood pressure medication today she cannot forgot. Her blood pressure is elevated but that may account for that. But something obviously we will keep a close eye on as well. Electronic Signature(s) Signed: 08/22/2021 3:06:32 PM By: Worthy Keeler PA-C Entered By: Worthy Keeler on 08/22/2021 15:06:32 DANALY, GANSTER ([id number]) -------------------------------------------------------------------------------- Physician Orders Details Patient Name: Jenna [name] Date of Service: 08/22/2021 2:00 PM Medical Record Number: [id number] Patient Account Number: [id number] Date of Birth/Sex: [birth year] (87 y.o. F) Treating RN: Cornell Barman Primary Care Provider: [name], Dibas Other Clinician: Referring Provider: Referral, Self Treating Provider/Extender: Skipper Cliche in Treatment: 0 Verbal / Phone Orders: No Diagnosis Coding ICD-10 Coding Code Description I87.2 Venous insufficiency (chronic) (peripheral) I89.0 Lymphedema, not elsewhere classified L97.821 Non-pressure chronic ulcer of other part of left lower leg limited to breakdown of skin E11.622 Type 2 diabetes mellitus with other skin ulcer I10 Essential (primary) hypertension Follow-up Appointments Wound #5 Left,Distal,Medial Lower Leg o Return  Appointment in 1 week. o Nurse Visit as needed Bathing/ Shower/ Hygiene Wound #5 Left,Distal,Medial Lower Leg o May shower with wound dressing protected with water repellent cover or cast protector. Edema Control - Lymphedema / Segmental Compressive Device / Other Left Lower Extremity o Elevate, Exercise Daily and Avoid Standing for Long Periods of Time. o Elevate legs to the level of the heart and pump ankles as often as possible o Elevate leg(s) parallel to the floor when sitting. Additional Orders / Instructions Wound #5 Left,Distal,Medial Lower Leg o Follow Nutritious Diet and Increase Protein Intake o Activity as tolerated Wound Treatment Wound #5 - Lower Leg Wound Laterality: Left, Medial, Distal Cleanser: Soap and Water Discharge Instructions: Gently cleanse wound with antibacterial soap, rinse and pat dry prior to dressing wounds Peri-Wound Care: Moisturizing Lotion Discharge Instructions: Suggestions: Theraderm, Eucerin, Cetaphil, or patient preference. Primary Dressing: AquacelAg Advantage Dressing, 2X2 (in/in) Discharge Instructions: Apply to wound as directed Compression Wrap: Profore Lite LF 3 Multilayer Compression Bandaging System Discharge Instructions: Apply 3 multi-layer wrap as prescribed. Compression Wrap: unna paste to anchor Electronic Signature(s) Signed: 08/22/2021 3:56:40 PM By: Worthy Keeler PA-C Signed: 08/23/2021 4:00:05 PM By: Gretta Cool, [name], RN, CWS, [name], [name] Entered By: Gretta Cool, [name], RN, CWS, Kim on 08/22/2021 15:07:30 Jenna [name], Jenna [name] ([id number]) Jenna [name], Jenna [name] ([id number]) -------------------------------------------------------------------------------- Problem List Details Patient Name: Jenna [name], REINTS. Date of Service: 08/22/2021 2:00 PM Medical Record Number: [id number] Patient Account Number: [id number] Date of Birth/Sex: Aug 18, 1931 (87 y.o. F) Treating RN: Cornell Barman Primary Care Provider: [name], Dibas Other  Clinician: Referring Provider: Referral, Self Treating Provider/Extender: Skipper Cliche in Treatment: 0 Active Problems ICD-10 Encounter Code Description Active Date MDM Diagnosis I87.2 Venous insufficiency (chronic) (peripheral) 08/22/2021 No Yes I89.0 Lymphedema, not elsewhere classified 08/22/2021 No Yes L97.821 Non-pressure chronic ulcer of other part of left lower leg limited to 08/22/2021 No Yes breakdown of skin E11.622 Type 2 diabetes mellitus with other skin ulcer 08/22/2021 No Yes I10 Essential (primary) hypertension 08/22/2021 No Yes Inactive Problems Resolved Problems Electronic Signature(s) Signed: 08/22/2021 2:58:31 PM By: Worthy Keeler PA-C Entered By: Worthy Keeler on 08/22/2021 14:58:31 Zabinski, [name] ([id number]) -------------------------------------------------------------------------------- Progress Note Details Patient Name: Jenna [name] Date of Service: 08/22/2021 2:00 PM Medical Record Number: [id number] Patient Account Number: [id number] Date of Birth/Sex: 1931/06/01 (87 y.o. F) Treating RN: Cornell Barman Primary Care Provider: [name], Dibas Other Clinician: Referring Provider: Referral, Self Treating Provider/Extender: Skipper Cliche in Treatment: 0 Subjective Chief Complaint Information obtained from Patient Left LE Ulcer History of Present Illness (HPI) The following HPI elements were documented for the patient's wound: Location: left lower extremity in the medial ankle region Quality: Patient reports experiencing a dull pain to affected area(s). Severity: Patient states wound are getting worse. Duration: Patient has had the wound for > 3 months prior to seeking treatment at the wound center Timing: Pain in wound is Intermittent (comes and goes Context: The wound appeared gradually over time Modifying Factors: Other treatment(s) tried include:[name] Associated Signs and Symptoms:  Patient reports having increase swelling. 87 year old patient with a past medical history significant for venous stasis ulceration and diabetes mellitus was recently admitted to the hospital between 07/07/2016 and 07/09/2016. She was wound to have a nonpurulent cellulitis of the left lower extremity and was started on IV antibiotics which included vancomycin. He is discharged home with her and Unna's boots and oral doxycycline. During this admission there was no obvious DVT involving the left lower extremity. her past medical history significant for diabetes mellitus, hypertension, hyperlipidemia, varicose veins, status post right rotator cuff repair, knee surgery on the left,robotic abdominal hysterectomy, laparoscopic cholecystectomy. She is not a smoker. Venous study done on 07/18/2015 showed normal left extremity deep venous system with no evidence of DVT. There was extensive valvular incompetence and reflux throughout the left greater saphenous vein with direct the medication to subcutaneous varicose veins. Normal left small saphenous vein. I understand she was seen by vascular radiology group and the workup and recommendation was for endovenous ablation but due to family pressure she was not able to keep her appointment a year ago. She has not been wearing her compression stockings regularly. 05/26/18 READMISSION this is an 87 year old woman who was previously seen in this clinic in 2017 by Dr. Con Memos. She has chronic venous insufficiency with lymphedema and at that time had open area on the left medial calf and ankle area. Was recommended that she wear 20-30 mm compression stockings and the patient tells me that she has been compliant with this although I think her primary doctor recently told her that she didn't need to wear stockings out of fear it might cause arterial compression. She noticed edema and pain in the area a week to 2 ago. She saw her primary doctor and was given doxycycline  and Silvadene cream to put on the area. She states things are a lot better. The patient has a history of type 2 diabetes on oral agents but she is unaware of her hemoglobin A1c  for her blood glucose which she doesn't check. She has hypertension, varicose veins, rotator cuff repair, knee surgery on the left, history of a laparoscopic cholecystectomy, lymphedema, obstructive sleep apnea, history of endometrial CA. The patient has been to see vein and vascular in the past and I think has had an ablation of the left greater saphenous vein based on ultrasounds of the left leg IC dating back to 2017. Her most recent ultrasound was in May 2018 which showed no evidence of a DVT and continued durable closure of the treated segment of the left greater saphenous vein. Patent lateral accessory branch of the greater saphenous vein extending to the calf ABIs in our clinic were 1.05 on the right and 0.95 on the left 06/02/18 the patient's wound on the left medial lower calf is completely closed and epithelialized. She has new 20-30 mm below-knee stockings READMISSION 11/02/2019 This is a now 87 year old woman who has been in this clinic 2 times before. Most recently in 2019. She has chronic venous insufficiency with some degree of secondary lymphedema. She has had a history of a left greater saphenous vein ablation. When she is here in 2019 she had a wound on the left medial lower calf. This closed fairly easily. It was recommended she wear 20/30 mm below-knee stockings she is not compliant with this. She arrives in clinic with a 2-week history of a left medial lower extremity and ankle wound. Some of this has dry slough on it but most of it is already epithelialize she has been using a combination of Vaseline and/or Silvadene. She is not wearing any compression. She has a history of chronic venous reflux. There was recommendations in the past for ablations I do not know that she ever carried through with this.  According to notes from 2016 this work-up was done via the interventional radiology group. We will need to research this. She is probably going to need a consultation ABIs in our clinic were 1.03 on the right and 0.93 on the left 11/11; patient's wound looks as though it is epithelializing horizontal wound on the left medial lower extremity. She has a superior satellite lesion. Jenna [name], Jenna [name] ([id number]) She is complaining of a lot of pain in 3 layer compression. There have been recommendations for previous ablations I am not sure if she followed up on this. She has been noncompliant with stockings although she brought those into the clinic today. 11/18; the patient had a repeat venous reflux studies at vein and vascular. This did not show any DVT in the left lower extremity there was no evidence of chronic venous insufficiency and no evidence of superficial vein thrombosis. I looked back at previous studies done I think in interventional radiology in 2018 would suggest that there had been previous ablation of a segment of the left greater saphenous vein. By review of the new study I do not think the patient needs to see vein and vascular however I find these studies increasingly difficult to interpret. I am not sure that the patient has actually consistently worn compression stockings and I think that is the next step here. The wound is just about closed 11/22/2019 upon evaluation today patient actually appears to be healed based on what I am seeing today. She has been tolerating the dressing changes without complication. Fortunately there is no signs of active infection at this time. Overall I feel like even though this is the first time of seeing her that she has actually done extremely well with  the current wound care measures again she has been under the care of Dr. Dellia Nims. 12/2 the patient was expected to be healed this week however she comes in with 3 small wounds that look much the same as  the pictures from 2 weeks ago. These are all in the area of the left medial lower extremity. She was put into her own compression stockings last week I do not think the edema control was adequate in this area for healing. We have been using Hydrofera Blue 12/9; wound is fully epithelialized but still looks vulnerable on the left medial lower extremity. Surrounding venous inflammation. I think she has lymphedema with fibrosed skin in the distal lower leg. [Inverted bottle sign]. She has had venous reflux studies that have not shown any of the superficial veins to be amenable to ablations. This is been repeated during this visit. I am not really convinced that she has been wearing compression stockings although she certainly has them. 12/23; patient's wound on the left medial lower extremity is totally healed. She has surrounding venous inflammation and skin damage related to there is also some degree of lymphedema and fibrosed skin in the distal lower extremity. She has her compression stocking Readmission: 03/25/2021 upon evaluation today patient appears to be doing excellent in regard to her wound all things considered. She does appear to have gotten very quickly this time which is great news compared to before when she tells me she let the wound get somewhat out of control. Nonetheless right now she tells me this has been present for about 3 weeks she has been using Vaseline on it. She does have a history of diabetes though she has not taken Metformin for years she sees her primary care provider on Friday and she will see what he says but she is hoping he would not put her back on this. Subsequently she also does have Lasix that she is not been taking it recently. She has a past medical history significant for venous stasis, lymphedema, diabetes mellitus type 2, and hypertension. 04/01/2021 on evaluation today patient appears to be doing well with regard to her wound. There is no signs of active  infection at this time. No fevers, chills, nausea, vomiting, or diarrhea. 04/08/2021 upon evaluation today patient appears to be doing excellent in regard to her wounds currently. She has been tolerating the dressing changes and in fact appears to be completely healed which is great news. Readmission: 08/22/2021 patient presents today for reevaluation here in the clinic concerning a reopening of the wound on the left medial ankle/lower leg region which is the same area I took care of earlier in the year between March and April. Fortunately there does not appear to be anything to do a peer which is good news. I do think that as before this can probably heal fatherly rapidly. Nonetheless I do believe that based on what we are seeing she probably needs a compression wrap at this time. Her medical history really is not changed. Her ABI back in March of this year was 1.28 on this left leg and was doing well. Patient History Information obtained from Patient. Allergies Shellfish Containing Products (Severity: Severe, Reaction: anaphlaxsis), Ancef (Severity: Severe, Reaction: nausea and vomiting), shellfish derived (Reaction: anaphalaxis) Family History Cancer - Siblings, Diabetes - Siblings, Hypertension - Maternal Grandparents,Paternal Grandparents,Mother,Father,Siblings, Lung Disease - Siblings, No family history of Heart Disease, Hereditary Spherocytosis, Kidney Disease, Seizures, Stroke, Thyroid Problems, Tuberculosis. Social History Never smoker, Marital Status - Married, Alcohol Use -  Never, Drug Use - No History, Caffeine Use - Daily. Medical History Eyes Patient has history of Cataracts - surgery, Glaucoma - left Ear/Nose/Mouth/Throat Denies history of Chronic sinus problems/congestion, Middle ear problems Hematologic/Lymphatic Patient has history of Lymphedema Denies history of Anemia, Hemophilia, Human Immunodeficiency Virus, Sickle Cell Disease Respiratory Denies history of  Aspiration, Asthma, Chronic Obstructive Pulmonary Disease (COPD), Pneumothorax, Sleep Apnea, Tuberculosis Cardiovascular Patient has history of Hypertension Denies history of Angina, Arrhythmia, Congestive Heart Failure, Coronary Artery Disease, Deep Vein Thrombosis, Hypotension, Myocardial Infarction, Peripheral Arterial Disease, Peripheral Venous Disease, Phlebitis, Vasculitis Gastrointestinal Denies history of Cirrhosis , Colitis, Crohn s, Hepatitis A, Hepatitis B, Hepatitis C Endocrine Patient has history of Type II Diabetes Genitourinary Jenna [name], Jenna [name] ([id number]) Denies history of End Stage Renal Disease Immunological Denies history of Lupus Erythematosus, Raynaud s, Scleroderma Integumentary (Skin) Denies history of History of Burn, History of pressure wounds Musculoskeletal Patient has history of Osteoarthritis Denies history of Gout, Rheumatoid Arthritis, Osteomyelitis Neurologic Denies history of Dementia, Neuropathy, Quadriplegia, Paraplegia, Seizure Disorder Oncologic Denies history of Received Chemotherapy, Received Radiation Psychiatric Denies history of Anorexia/bulimia, Confinement Anxiety Objective Constitutional patient is hypertensive.. pulse regular and within target range for patient.Marland Kitchen respirations regular, non-labored and within target range for patient.Marland Kitchen temperature within target range for patient.. Well-nourished and well-hydrated in no acute distress. Vitals Time Taken: 2:32 PM, Height: 60 in, Weight: 212 lbs, Source: Measured, BMI: 41.4, Temperature: 97.9 F, Pulse: 59 bpm, Respiratory Rate: 16 breaths/min, Blood Pressure: 163/63 mmHg. Eyes conjunctiva clear no eyelid edema noted. pupils equal round and reactive to light and accommodation. Ears, Nose, Mouth, and Throat no gross abnormality of ear auricles or external auditory canals. normal hearing noted during conversation. mucus membranes moist. Respiratory normal breathing without  difficulty. Cardiovascular 2+ dorsalis pedis/posterior tibialis pulses. 1+ pitting edema of the bilateral lower extremities. Musculoskeletal normal gait and posture. no significant deformity or arthritic changes, no loss or range of motion, no clubbing. Psychiatric this patient is able to make decisions and demonstrates good insight into disease process. Alert and Oriented x 3. pleasant and cooperative. General Notes: Upon inspection patient's wound bed actually showed signs of good granulation epithelization at this point. Fortunately there does not appear to be any signs of active infection at this time which is great news and overall very pleased with where things stand currently. The patient does tell me she did not take her blood pressure medication today she cannot forgot. Her blood pressure is elevated but that may account for that. But something obviously we will keep a close eye on as well. Integumentary (Hair, Skin) Wound #5 status is Open. Original cause of wound was Gradually Appeared. The date acquired was: 08/15/2021. The wound is located on the Left,Distal,Medial Lower Leg. The wound measures 0.7cm length x 2.5cm width x 0.1cm depth; 1.374cm^2 area and 0.137cm^3 volume. There is Fat Layer (Subcutaneous Tissue) exposed. There is no tunneling or undermining noted. There is a medium amount of serous drainage noted. The wound margin is flat and intact. There is small (1-33%) red granulation within the wound bed. There is a large (67-100%) amount of necrotic tissue within the wound bed including Adherent Slough. Assessment Active Problems ICD-10 Venous insufficiency (chronic) (peripheral) Lymphedema, not elsewhere classified Non-pressure chronic ulcer of other part of left lower leg limited to breakdown of skin Jenna [name], Jenna [name]. ([id number]) Type 2 diabetes mellitus with other skin ulcer Essential (primary) hypertension Procedures Wound #5 Pre-procedure diagnosis of Wound #5 is a  Venous Leg Ulcer located on  the Left,Distal,Medial Lower Leg . There was a Three Layer Compression Therapy Procedure with a pre-treatment ABI of 1.3 by Cornell Barman, RN. Post procedure Diagnosis Wound #5: Same as Pre-Procedure Plan 1. Would recommend currently that we go ahead and initiate treatment with a silver alginate dressing. I think this probably can be the best way to go and the patient is in agreement with that plan. Otherwise I think she in general is doing quite well which is great news. 2. I am also can recommend that we go ahead and initiate treatment with a 3 layer compression wrap which I think will also do well for her. 3. I am also can recommend the patient should continue to elevate her legs when she is healed again she needs to be wearing her compression socks regularly she has them at home she does has not been wearing them because they were hot. We will see patient back for reevaluation in 1 week here in the clinic. If anything worsens or changes patient will contact our office for additional recommendations. Electronic Signature(s) Signed: 08/22/2021 3:07:09 PM By: Worthy Keeler PA-C Entered By: Worthy Keeler on 08/22/2021 15:07:08 Jenna [name], Jenna [name] ([id number]) -------------------------------------------------------------------------------- ROS/PFSH Details Patient Name: Jenna [name] Date of Service: 08/22/2021 2:00 PM Medical Record Number: [id number] Patient Account Number: [id number] Date of Birth/Sex: [birth year] (87 y.o. F) Treating RN: Cornell Barman Primary Care Provider: [name], [place]ibas Other Clinician: Referring Provider: Referral, Self Treating Provider/Extender: Skipper Cliche in Treatment: 0 Information Obtained From Patient Eyes Medical History: Positive for: Cataracts - surgery; Glaucoma - left Ear/Nose/Mouth/Throat Medical History: Negative for: Chronic sinus problems/congestion; Middle ear problems Hematologic/Lymphatic Medical  History: Positive for: Lymphedema Negative for: Anemia; Hemophilia; Human Immunodeficiency Virus; Sickle Cell Disease Respiratory Medical History: Negative for: Aspiration; Asthma; Chronic Obstructive Pulmonary Disease (COPD); Pneumothorax; Sleep Apnea; Tuberculosis Cardiovascular Medical History: Positive for: Hypertension Negative for: Angina; Arrhythmia; Congestive Heart Failure; Coronary Artery Disease; Deep Vein Thrombosis; Hypotension; Myocardial Infarction; Peripheral Arterial Disease; Peripheral Venous Disease; Phlebitis; Vasculitis Gastrointestinal Medical History: Negative for: Cirrhosis ; Colitis; Crohnos; Hepatitis A; Hepatitis B; Hepatitis C Endocrine Medical History: Positive for: Type II Diabetes Time with diabetes: 18 years Treated with: Diet Blood sugar tested every day: Yes Tested : rarely Genitourinary Medical History: Negative for: End Stage Renal Disease Immunological Medical History: Negative for: Lupus Erythematosus; Raynaudos; Scleroderma Integumentary (Skin) Medical History: Negative for: History of Burn; History of pressure wounds Jenna [name], Jenna [name] ([id number]) Musculoskeletal Medical History: Positive for: Osteoarthritis Negative for: Gout; Rheumatoid Arthritis; Osteomyelitis Neurologic Medical History: Negative for: Dementia; Neuropathy; Quadriplegia; Paraplegia; Seizure Disorder Oncologic Medical History: Negative for: Received Chemotherapy; Received Radiation Psychiatric Medical History: Negative for: Anorexia/bulimia; Confinement Anxiety HBO Extended History Items Eyes: Eyes: Cataracts Glaucoma Immunizations Pneumococcal Vaccine: Received Pneumococcal Vaccination: Yes Received Pneumococcal Vaccination On or After 60th Birthday: Yes Implantable Devices None Family and Social History Cancer: Yes - Siblings; Diabetes: Yes - Siblings; Heart Disease: No; Hereditary Spherocytosis: No; Hypertension: Yes - Maternal Grandparents,Paternal  Grandparents,Mother,Father,Siblings; Kidney Disease: No; Lung Disease: Yes - Siblings; Seizures: No; Stroke: No; Thyroid Problems: No; Tuberculosis: No; Never smoker; Marital Status - Married; Alcohol Use: Never; Drug Use: No History; Caffeine Use: Daily; Financial Concerns: No; Food, Clothing or Shelter Needs: No; Support System Lacking: No; Transportation Concerns: No Engineer, maintenance) Signed: 08/22/2021 3:56:40 PM By: Worthy Keeler PA-C Signed: 08/23/2021 4:00:05 PM By: Gretta Cool, [name], RN, CWS, [name], [name] Entered By: Gretta Cool, [name], RN, CWS, Kim on 08/22/2021 14:45:57 Gladue, [name] ([id number]) -------------------------------------------------------------------------------- SuperBill  Details Patient Name: ROKAYA, BOGACZ. Date of Service: 08/22/2021 Medical Record Number: [id number] Patient Account Number: [id number] Date of Birth/Sex: Jul 23, 1931 (87 y.o. F) Treating RN: Cornell Barman Primary Care Provider: [name], Dibas Other Clinician: Referring Provider: Referral, Self Treating Provider/Extender: Skipper Cliche in Treatment: 0 Diagnosis Coding ICD-10 Codes Code Description I87.2 Venous insufficiency (chronic) (peripheral) I89.0 Lymphedema, not elsewhere classified L97.821 Non-pressure chronic ulcer of other part of left lower leg limited to breakdown of skin E11.622 Type 2 diabetes mellitus with other skin ulcer I10 Essential (primary) hypertension Facility Procedures CPT4 Code: [id number] Description: (Facility Use Only) [phone] - [place] LWR LT LEG Modifier: Quantity: 1 Physician Procedures CPT4 [id number] Description: [id number] - WC PHYS LEVEL 4 - EST PT Modifier: Quantity: 1 CPT4 Code: Description: ICD-10 Diagnosis Description I89.0 Lymphedema, not elsewhere classified I87.2 Venous insufficiency (chronic) (peripheral) L97.821 Non-pressure chronic ulcer of other part of left lower leg limited to bre E11.622 Type 2 diabetes mellitus with  other skin  ulcer Modifier: akdown of skin Quantity: Electronic Signature(s) Signed: 08/22/2021 3:56:40 PM By: Worthy Keeler PA-C Signed: 08/23/2021 4:00:05 PM By: Gretta Cool, [name], RN, CWS, [name], [name] Previous Signature: 08/22/2021 3:07:28 PM Version By: Worthy Keeler PA-C Entered By: Gretta Cool, [name], RN, CWS, Kim on 08/22/2021 15:21:54

## 2021-08-23 NOTE — Progress Notes (Signed)
Jenna [name] ([id number]) Visit Report for 08/22/2021 Abuse/Suicide Risk Screen Details Patient Name: Jenna [name], Jenna [name]. Date of Service: 08/22/2021 2:00 PM Medical Record Number: [id number] Patient Account Number: [id number] Date of Birth/Sex: February 09, 1931 (87 y.o. F) Treating RN: Jenna [name] Primary Care Jenna [name]: Jenna [name], Jenna [name] Other Clinician: Referring Jenna [name]: Referral, Self Treating Jenna [name]/Extender: Jenna [name] in Treatment: 0 Abuse/Suicide Risk Screen Items Answer ABUSE RISK SCREEN: Has anyone close to you tried to hurt or harm you recentlyo No Do you feel uncomfortable with anyone in your familyo No Has anyone forced you do things that you didnot want to doo No Electronic Signature(s) Signed: 08/23/2021 4:00:05 PM By: Jenna [name], [name], RN, CWS, [name], [name] Entered By: Jenna [name], [name], RN, CWS, Jenna [name] on 08/22/2021 14:46:08 Jenna [name] ([id number]) -------------------------------------------------------------------------------- Activities of Daily Living Details Patient Name: Jenna [name], Jenna [name]. Date of Service: 08/22/2021 2:00 PM Medical Record Number: [id number] Patient Account Number: [id number] Date of Birth/Sex: [birth year] (87 y.o. F) Treating RN: Jenna [name] Primary Care Jenna [name]: Jenna [name], Jenna [name] Other Clinician: Referring Rontavious Albright: Referral, Self Treating Falon Flinchum/Extender: Jenna [name] in Treatment: 0 Activities of Daily Living Items Answer Activities of Daily Living (Please select one for each item) Drive Automobile Not Able Take Medications Completely Able Use Telephone Completely Able Care for Appearance Completely Able Use Toilet Completely Able Bath / Shower Completely Able Dress Self Completely Able Feed Self Completely Able Walk Completely Able Get In / Out Bed Completely Able Housework Completely Able Prepare Meals Completely Able Handle Money Completely Able Shop for Self Completely Able Electronic Signature(s) Signed: 08/23/2021 4:00:05 PM By:  Jenna [name], [name], RN, CWS, [name], [name] Entered By: Jenna [name], [name], RN, CWS, Jenna [name] on 08/22/2021 14:46:28 Jenna [name] ([id number]) -------------------------------------------------------------------------------- Education Screening Details Patient Name: Jenna [name] Date of Service: 08/22/2021 2:00 PM Medical Record Number: [id number] Patient Account Number: [id number] Date of Birth/Sex: January 28, 1931 (87 y.o. F) Treating RN: Jenna [name] Primary Care Jenna [name]: Jenna [name], Jenna [name] Other Clinician: Referring Laden Fieldhouse: Referral, Self Treating Pecolia Marando/Extender: Jenna [name] in Treatment: 0 Primary Learner Assessed: Patient Learning Preferences/Education Level/Primary Language Learning Preference: Explanation, Demonstration Highest Education Level: High School Preferred Language: English Cognitive Barrier Language Barrier: No Translator Needed: No Memory Deficit: No Emotional Barrier: No Cultural/Religious Beliefs Affecting Medical Care: No Physical Barrier Impaired Vision: Yes Glasses Knowledge/Comprehension Knowledge Level: High Comprehension Level: High Ability to understand written instructions: High Ability to understand verbal instructions: High Motivation Anxiety Level: Calm Cooperation: Cooperative Education Importance: Acknowledges Need Interest in Health Problems: Asks Questions Perception: Coherent Willingness to Engage in Self-Management Medium Activities: Readiness to Engage in Self-Management High Activities: Psychologist, prison and probation services) Signed: 08/23/2021 4:00:05 PM By: Jenna [name], [name], RN, CWS, [name], [name] Entered By: Jenna [name], [name], RN, CWS, Jenna [name] on 08/22/2021 14:47:14 Jenna [name] ([id number]) -------------------------------------------------------------------------------- Fall Risk Assessment Details Patient Name: Jenna [name] Date of Service: 08/22/2021 2:00 PM Medical Record Number: [id number] Patient Account Number: [id number] Date of Birth/Sex: January 29, 1931 (87  y.o. F) Treating RN: Jenna [name] Primary Care Gagan Dillion: Jenna [name], Jenna [name] Other Clinician: Referring Jedadiah Abdallah: Referral, Self Treating Shavaughn Seidl/Extender: Jenna [name] in Treatment: 0 Fall Risk Assessment Items Have you had 2 or more falls in the last 12 monthso 0 No Have you had any fall that resulted in injury in the last 12 monthso 0 No FALLS RISK SCREEN History of falling - immediate or within 3 months 0 No Secondary diagnosis (Do you have 2 or more medical diagnoseso) 0 No Ambulatory aid None/bed rest/wheelchair/nurse 0 No Crutches/cane/walker 15 Yes Furniture 0 No Intravenous therapy  Access/Saline/Heparin Lock 0 No Gait/Transferring Normal/ bed rest/ wheelchair 0 No Weak (short steps with or without shuffle, stooped but able to lift head while walking, may 10 Yes seek support from furniture) Impaired (short steps with shuffle, may have difficulty arising from chair, head down, impaired 0 No balance) Mental Status Oriented to own ability 0 Yes Electronic Signature(s) Signed: 08/23/2021 4:00:05 PM By: Jenna [name], [name], RN, CWS, [name], [name] Entered By: Jenna [name], [name], RN, CWS, Jenna [name] on 08/22/2021 14:47:41 [name], Jenna Lovings ([id number]) -------------------------------------------------------------------------------- Foot Assessment Details Patient Name: Jenna [name] Date of Service: 08/22/2021 2:00 PM Medical Record Number: [id number] Patient Account Number: [id number] Date of Birth/Sex: [birth year] (87 y.o. F) Treating RN: Jenna [name] Primary Care Senya Hinzman: Jenna [name], Jenna [name] Other Clinician: Referring Jacquis Paxton: Referral, Self Treating Deshay Blumenfeld/Extender: Jenna [name] in Treatment: 0 Foot Assessment Items Site Locations + = Sensation present, - = Sensation absent, C = Callus, U = Ulcer R = Redness, W = Warmth, M = Maceration, PU = Pre-ulcerative lesion F = Fissure, S = Swelling, D = Dryness Assessment Right: Left: Other Deformity: No No Prior Foot Ulcer: No No Prior  Amputation: No No Charcot Joint: No No Ambulatory Status: Ambulatory With Help Assistance Device: Cane Gait: Surveyor, mining) Signed: 08/23/2021 4:00:05 PM By: Jenna [name], [name], RN, CWS, [name], [name] Entered By: Jenna [name], [name], RN, CWS, Jenna [name] on 08/22/2021 14:49:24 Jenna [name] ([id number]) -------------------------------------------------------------------------------- Nutrition Risk Screening Details Patient Name: Jenna [name] Date of Service: 08/22/2021 2:00 PM Medical Record Number: [id number] Patient Account Number: [id number] Date of Birth/Sex: 01/25/1931 (87 y.o. F) Treating RN: Jenna [name] Primary Care Brandis Matsuura: Jenna [name], Jenna [name] Other Clinician: Referring Britany Callicott: Referral, Self Treating Stephaie Dardis/Extender: Jenna [name] in Treatment: 0 Height (in): 60 Weight (lbs): 212 Body Mass Index (BMI): 41.4 Nutrition Risk Screening Items Score Screening NUTRITION RISK SCREEN: I have an illness or condition that made me change the kind and/or amount of food I eat 0 No I eat fewer than two meals per day 0 No I eat few fruits and vegetables, or milk products 0 No I have three or more drinks of beer, liquor or wine almost every day 0 No I have tooth or mouth problems that make it hard for me to eat 0 No I don't always have enough money to buy the food I need 0 No I eat alone most of the time 0 No I take three or more different prescribed or over-the-counter drugs a day 1 Yes Without wanting to, I have lost or gained 10 pounds in the last six months 0 No I am not always physically able to shop, cook and/or feed myself 0 No Nutrition Protocols Good Risk Protocol Provide education on elevated Moderate Risk Protocol 0 blood sugars and impact on wound healing, as applicable High Risk Proctocol Risk Level: Good Risk Score: 1 Electronic Signature(s) Signed: 08/23/2021 4:00:05 PM By: Jenna [name], [name], RN, CWS, [name], [name] Entered By: Jenna [name], [name], RN, CWS, Jenna [name] on 08/22/2021  14:48:00

## 2021-08-23 NOTE — Progress Notes (Signed)
N/A N/A Classification: Full Thickness Without Exposed N/A N/A Support Structures Exudate Amount: Medium N/A N/A Exudate Type: Serous N/A N/A Exudate Color: amber N/A N/A Wound Margin: Flat and Intact N/A N/A Granulation Amount: Small (1-33%) N/A N/A Granulation Quality: Red N/A N/A Necrotic Amount: Large (67-100%) N/A N/A Exposed Structures: Fat Layer (Subcutaneous Tissue): N/A N/A Yes Fascia: No Tendon: No Muscle: No Joint: No Bone: No Epithelialization: Small (1-33%) N/A N/A Treatment Notes Electronic Signature(s) Signed: 08/23/2021 4:00:05 PM By: Gretta Cool, [name], RN, CWS, [name], [name] Entered By: Gretta Cool, [name], RN, CWS, Kim on 08/22/2021 15:02:05 Jenna [name] ([id number]) -------------------------------------------------------------------------------- [place] Details Patient Name: Jenna [name] Date of  Service: 08/22/2021 2:00 PM Medical Record Number: [id number] Patient Account Number: [id number] Date of Birth/Sex: 01-02-1931 (87 y.o. F) Treating RN: Cornell Barman Primary Care Patrich Heinze: [name], Dibas Other Clinician: Referring Shellia Hartl: Referral, Self Treating Delesia Martinek/Extender: Skipper Cliche in Treatment: 0 Active Inactive Orientation to the Wound Care Program Nursing Diagnoses: Knowledge deficit related to the wound healing center program Goals: Patient/caregiver will verbalize understanding of the [facility] Date Initiated: 08/22/2021 Target Resolution Date: 08/22/2021 Goal Status: Active Interventions: Provide education on orientation to the wound center Notes: Venous Leg Ulcer Nursing Diagnoses: Knowledge deficit related to disease process and management Potential for venous Insuffiency (use before diagnosis confirmed) Goals: Patient will maintain optimal edema control Date Initiated: 08/22/2021 Target Resolution Date: 08/29/2021 Goal Status: Active Patient/caregiver will verbalize understanding of disease process and disease management Date Initiated: 08/22/2021 Target Resolution Date: 08/29/2021 Goal Status: Active Verify adequate tissue perfusion prior to therapeutic compression application Date Initiated: 08/22/2021 Target Resolution Date: 08/29/2021 Goal Status: Active Interventions: Assess peripheral edema status every visit. Compression as ordered Provide education on venous insufficiency Treatment Activities: Therapeutic compression applied : 08/22/2021 Notes: Wound/Skin Impairment Nursing Diagnoses: Impaired tissue integrity Goals: Patient/caregiver will verbalize understanding of skin care regimen Date Initiated: 08/22/2021 Target Resolution Date: 09/05/2021 Goal Status: Active Ulcer/skin breakdown will have a volume reduction of 30% by week 4 Date Initiated: 08/22/2021 Target Resolution Date: 09/19/2021 Goal Status: Active AYNSLEY, BENGE  ([id number]) Interventions: Assess patient/caregiver ability to perform ulcer/skin care regimen upon admission and as needed Assess ulceration(s) every visit Treatment Activities: Skin care regimen initiated : 08/22/2021 Topical wound management initiated : 08/22/2021 Notes: Electronic Signature(s) Signed: 08/23/2021 4:00:05 PM By: Gretta Cool, [name], RN, CWS, [name], [name] Entered By: Gretta Cool, [name], RN, CWS, Kim on 08/22/2021 15:01:44 Jenna [name] ([id number]) -------------------------------------------------------------------------------- Pain Assessment Details Patient Name: Jenna [name] Date of Service: 08/22/2021 2:00 PM Medical Record Number: [id number] Patient Account Number: [id number] Date of Birth/Sex: 13-May-1931 (87 y.o. F) Treating RN: Cornell Barman Primary Care Laquashia Mergenthaler: [name], Dibas Other Clinician: Referring Shakim Faith: Referral, Self Treating Johnny Latu/Extender: Skipper Cliche in Treatment: 0 Active Problems Location of Pain Severity and Description of Pain Patient Has Paino Yes Site Locations Pain Location: Pain in Ulcers Duration of the Pain. Constant / Intermittento Intermittent Rate the pain. Current Pain Level: 8 Character of Pain Describe the Pain: Shooting Pain Management and Medication Current Pain Management: Electronic Signature(s) Signed: 08/23/2021 4:00:05 PM By: Gretta Cool, [name], RN, CWS, [name], [name] Entered By: Gretta Cool, [name], RN, CWS, Kim on 08/22/2021 14:32:30 Jenna [name] ([id number]) -------------------------------------------------------------------------------- Patient/Caregiver Education Details Patient Name: Jenna [name] Date of Service: 08/22/2021 2:00 PM Medical Record Number: [id number] Patient Account Number: [id number] Date of Birth/Gender: 04-Jul-1931 (87 y.o. F) Treating RN: Cornell Barman Primary Care Physician: Lujean Amel Other Clinician: Referring Physician: Referral, Self Treating  SHYAN, BENWAY ([id number]) Visit Report for 08/22/2021 Allergy List Details Patient Name: KAMLYN, SLUPSKI. Date of Service: 08/22/2021 2:00 PM Medical Record Number: [id number] Patient Account Number: [id number] Date of Birth/Sex: 1931/10/22 (87 y.o. F) Treating RN: Cornell Barman Primary Care Ahria Slappey: [name], Dibas Other Clinician: Referring Ruey Storer: Referral, Self Treating Dmari Schubring/Extender: Skipper Cliche in Treatment: 0 Allergies Active Allergies Shellfish Containing Products Reaction: anaphlaxsis Severity: Severe Ancef Reaction: nausea and vomiting Severity: Severe shellfish derived Reaction: anaphalaxis Allergy Notes Electronic Signature(s) Signed: 08/23/2021 4:00:05 PM By: Gretta Cool, [name], RN, CWS, [name], [name] Entered By: Gretta Cool, [name], RN, CWS, Kim on 08/22/2021 14:43:56 Watts, [name] ([id number]) -------------------------------------------------------------------------------- Arrival Information Details Patient Name: Jenna [name] Date of Service: 08/22/2021 2:00 PM Medical Record Number: [id number] Patient Account Number: [id number] Date of Birth/Sex: [birth year] (87 y.o. F) Treating RN: Cornell Barman Primary Care Kyrell Ruacho: [name], Dibas Other Clinician: Referring Azoria Abbett: Referral, Self Treating Carlisia Geno/Extender: Skipper Cliche in Treatment: 0 Visit Information Patient Arrived: Cane Arrival Time: 14:28 Accompanied By: self Transfer Assistance: None Patient Identification Verified: Yes Secondary Verification Process Completed: Yes Patient Has Alerts: Yes Patient Alerts: Patient on Blood Thinner aspirin 325 Type II Diabetic ABI 03/25/2021 L)1.28 History Since Last Visit Has Compression in Place as Prescribed: No Electronic Signature(s) Signed: 08/23/2021 4:00:05 PM By: Gretta Cool, [name], RN, CWS, [name], [name] Entered By: Gretta Cool, [name], RN, CWS, Kim on 08/22/2021 15:03:38 Jenna [name]  ([id number]) -------------------------------------------------------------------------------- Clinic Level of Care Assessment Details Patient Name: Jenna [name] Date of Service: 08/22/2021 2:00 PM Medical Record Number: [id number] Patient Account Number: [id number] Date of Birth/Sex: [birth year] (87 y.o. F) Treating RN: Cornell Barman Primary Care Shianne Zeiser: [name], Dibas Other Clinician: Referring Dezzie Badilla: Referral, Self Treating Shallen Luedke/Extender: Skipper Cliche in Treatment: 0 Clinic Level of Care Assessment Items TOOL 1 Quantity Score '[]'$  - Use when EandM and Procedure is performed on INITIAL visit 0 ASSESSMENTS - Nursing Assessment / Reassessment X - General Physical Exam (combine w/ comprehensive assessment (listed just below) when performed on new 1 20 pt. evals) X- 1 25 Comprehensive Assessment (HX, ROS, Risk Assessments, Wounds Hx, etc.) ASSESSMENTS - Wound and Skin Assessment / Reassessment '[]'$  - Dermatologic / Skin Assessment (not related to wound area) 0 ASSESSMENTS - Ostomy and/or Continence Assessment and Care '[]'$  - Incontinence Assessment and Management 0 '[]'$  - 0 Ostomy Care Assessment and Management (repouching, etc.) PROCESS - Coordination of Care X - Simple Patient / Family Education for ongoing care 1 15 '[]'$  - 0 Complex (extensive) Patient / Family Education for ongoing care X- 1 10 Staff obtains Programmer, systems, Records, Test Results / Process Orders '[]'$  - 0 Staff telephones HHA, Nursing Homes / Clarify orders / etc '[]'$  - 0 Routine Transfer to another Facility (non-emergent condition) '[]'$  - 0 Routine Hospital Admission (non-emergent condition) X- 1 15 New Admissions / Biomedical engineer / Ordering NPWT, Apligraf, etc. '[]'$  - 0 Emergency Hospital Admission (emergent condition) PROCESS - Special Needs '[]'$  - Pediatric / Minor Patient Management 0 '[]'$  - 0 Isolation Patient Management '[]'$  - 0 Hearing / Language / Visual special needs '[]'$  - 0 Assessment of Community  assistance (transportation, D/C planning, etc.) '[]'$  - 0 Additional assistance / Altered mentation '[]'$  - 0 Support Surface(s) Assessment (bed, cushion, seat, etc.) INTERVENTIONS - Miscellaneous '[]'$  - External ear exam 0 '[]'$  - 0 Patient Transfer (multiple staff / Civil Service fast streamer / Similar devices) '[]'$  - 0 Simple Staple / Suture removal (25 or less) '[]'$  - 0 Complex Staple / Suture removal (26  SHYAN, BENWAY ([id number]) Visit Report for 08/22/2021 Allergy List Details Patient Name: KAMLYN, SLUPSKI. Date of Service: 08/22/2021 2:00 PM Medical Record Number: [id number] Patient Account Number: [id number] Date of Birth/Sex: 1931/10/22 (87 y.o. F) Treating RN: Cornell Barman Primary Care Ahria Slappey: [name], Dibas Other Clinician: Referring Ruey Storer: Referral, Self Treating Dmari Schubring/Extender: Skipper Cliche in Treatment: 0 Allergies Active Allergies Shellfish Containing Products Reaction: anaphlaxsis Severity: Severe Ancef Reaction: nausea and vomiting Severity: Severe shellfish derived Reaction: anaphalaxis Allergy Notes Electronic Signature(s) Signed: 08/23/2021 4:00:05 PM By: Gretta Cool, [name], RN, CWS, [name], [name] Entered By: Gretta Cool, [name], RN, CWS, Kim on 08/22/2021 14:43:56 Watts, [name] ([id number]) -------------------------------------------------------------------------------- Arrival Information Details Patient Name: Jenna [name] Date of Service: 08/22/2021 2:00 PM Medical Record Number: [id number] Patient Account Number: [id number] Date of Birth/Sex: [birth year] (87 y.o. F) Treating RN: Cornell Barman Primary Care Kyrell Ruacho: [name], Dibas Other Clinician: Referring Azoria Abbett: Referral, Self Treating Carlisia Geno/Extender: Skipper Cliche in Treatment: 0 Visit Information Patient Arrived: Cane Arrival Time: 14:28 Accompanied By: self Transfer Assistance: None Patient Identification Verified: Yes Secondary Verification Process Completed: Yes Patient Has Alerts: Yes Patient Alerts: Patient on Blood Thinner aspirin 325 Type II Diabetic ABI 03/25/2021 L)1.28 History Since Last Visit Has Compression in Place as Prescribed: No Electronic Signature(s) Signed: 08/23/2021 4:00:05 PM By: Gretta Cool, [name], RN, CWS, [name], [name] Entered By: Gretta Cool, [name], RN, CWS, Kim on 08/22/2021 15:03:38 Jenna [name]  ([id number]) -------------------------------------------------------------------------------- Clinic Level of Care Assessment Details Patient Name: Jenna [name] Date of Service: 08/22/2021 2:00 PM Medical Record Number: [id number] Patient Account Number: [id number] Date of Birth/Sex: [birth year] (87 y.o. F) Treating RN: Cornell Barman Primary Care Shianne Zeiser: [name], Dibas Other Clinician: Referring Dezzie Badilla: Referral, Self Treating Shallen Luedke/Extender: Skipper Cliche in Treatment: 0 Clinic Level of Care Assessment Items TOOL 1 Quantity Score '[]'$  - Use when EandM and Procedure is performed on INITIAL visit 0 ASSESSMENTS - Nursing Assessment / Reassessment X - General Physical Exam (combine w/ comprehensive assessment (listed just below) when performed on new 1 20 pt. evals) X- 1 25 Comprehensive Assessment (HX, ROS, Risk Assessments, Wounds Hx, etc.) ASSESSMENTS - Wound and Skin Assessment / Reassessment '[]'$  - Dermatologic / Skin Assessment (not related to wound area) 0 ASSESSMENTS - Ostomy and/or Continence Assessment and Care '[]'$  - Incontinence Assessment and Management 0 '[]'$  - 0 Ostomy Care Assessment and Management (repouching, etc.) PROCESS - Coordination of Care X - Simple Patient / Family Education for ongoing care 1 15 '[]'$  - 0 Complex (extensive) Patient / Family Education for ongoing care X- 1 10 Staff obtains Programmer, systems, Records, Test Results / Process Orders '[]'$  - 0 Staff telephones HHA, Nursing Homes / Clarify orders / etc '[]'$  - 0 Routine Transfer to another Facility (non-emergent condition) '[]'$  - 0 Routine Hospital Admission (non-emergent condition) X- 1 15 New Admissions / Biomedical engineer / Ordering NPWT, Apligraf, etc. '[]'$  - 0 Emergency Hospital Admission (emergent condition) PROCESS - Special Needs '[]'$  - Pediatric / Minor Patient Management 0 '[]'$  - 0 Isolation Patient Management '[]'$  - 0 Hearing / Language / Visual special needs '[]'$  - 0 Assessment of Community  assistance (transportation, D/C planning, etc.) '[]'$  - 0 Additional assistance / Altered mentation '[]'$  - 0 Support Surface(s) Assessment (bed, cushion, seat, etc.) INTERVENTIONS - Miscellaneous '[]'$  - External ear exam 0 '[]'$  - 0 Patient Transfer (multiple staff / Civil Service fast streamer / Similar devices) '[]'$  - 0 Simple Staple / Suture removal (25 or less) '[]'$  - 0 Complex Staple / Suture removal (26  N/A N/A Classification: Full Thickness Without Exposed N/A N/A Support Structures Exudate Amount: Medium N/A N/A Exudate Type: Serous N/A N/A Exudate Color: amber N/A N/A Wound Margin: Flat and Intact N/A N/A Granulation Amount: Small (1-33%) N/A N/A Granulation Quality: Red N/A N/A Necrotic Amount: Large (67-100%) N/A N/A Exposed Structures: Fat Layer (Subcutaneous Tissue): N/A N/A Yes Fascia: No Tendon: No Muscle: No Joint: No Bone: No Epithelialization: Small (1-33%) N/A N/A Treatment Notes Electronic Signature(s) Signed: 08/23/2021 4:00:05 PM By: Gretta Cool, [name], RN, CWS, [name], [name] Entered By: Gretta Cool, [name], RN, CWS, Kim on 08/22/2021 15:02:05 Jenna [name] ([id number]) -------------------------------------------------------------------------------- [place] Details Patient Name: Jenna [name] Date of  Service: 08/22/2021 2:00 PM Medical Record Number: [id number] Patient Account Number: [id number] Date of Birth/Sex: 01-02-1931 (87 y.o. F) Treating RN: Cornell Barman Primary Care Patrich Heinze: [name], Dibas Other Clinician: Referring Shellia Hartl: Referral, Self Treating Delesia Martinek/Extender: Skipper Cliche in Treatment: 0 Active Inactive Orientation to the Wound Care Program Nursing Diagnoses: Knowledge deficit related to the wound healing center program Goals: Patient/caregiver will verbalize understanding of the [facility] Date Initiated: 08/22/2021 Target Resolution Date: 08/22/2021 Goal Status: Active Interventions: Provide education on orientation to the wound center Notes: Venous Leg Ulcer Nursing Diagnoses: Knowledge deficit related to disease process and management Potential for venous Insuffiency (use before diagnosis confirmed) Goals: Patient will maintain optimal edema control Date Initiated: 08/22/2021 Target Resolution Date: 08/29/2021 Goal Status: Active Patient/caregiver will verbalize understanding of disease process and disease management Date Initiated: 08/22/2021 Target Resolution Date: 08/29/2021 Goal Status: Active Verify adequate tissue perfusion prior to therapeutic compression application Date Initiated: 08/22/2021 Target Resolution Date: 08/29/2021 Goal Status: Active Interventions: Assess peripheral edema status every visit. Compression as ordered Provide education on venous insufficiency Treatment Activities: Therapeutic compression applied : 08/22/2021 Notes: Wound/Skin Impairment Nursing Diagnoses: Impaired tissue integrity Goals: Patient/caregiver will verbalize understanding of skin care regimen Date Initiated: 08/22/2021 Target Resolution Date: 09/05/2021 Goal Status: Active Ulcer/skin breakdown will have a volume reduction of 30% by week 4 Date Initiated: 08/22/2021 Target Resolution Date: 09/19/2021 Goal Status: Active AYNSLEY, BENGE  ([id number]) Interventions: Assess patient/caregiver ability to perform ulcer/skin care regimen upon admission and as needed Assess ulceration(s) every visit Treatment Activities: Skin care regimen initiated : 08/22/2021 Topical wound management initiated : 08/22/2021 Notes: Electronic Signature(s) Signed: 08/23/2021 4:00:05 PM By: Gretta Cool, [name], RN, CWS, [name], [name] Entered By: Gretta Cool, [name], RN, CWS, Kim on 08/22/2021 15:01:44 Jenna [name] ([id number]) -------------------------------------------------------------------------------- Pain Assessment Details Patient Name: Jenna [name] Date of Service: 08/22/2021 2:00 PM Medical Record Number: [id number] Patient Account Number: [id number] Date of Birth/Sex: 13-May-1931 (87 y.o. F) Treating RN: Cornell Barman Primary Care Laquashia Mergenthaler: [name], Dibas Other Clinician: Referring Shakim Faith: Referral, Self Treating Johnny Latu/Extender: Skipper Cliche in Treatment: 0 Active Problems Location of Pain Severity and Description of Pain Patient Has Paino Yes Site Locations Pain Location: Pain in Ulcers Duration of the Pain. Constant / Intermittento Intermittent Rate the pain. Current Pain Level: 8 Character of Pain Describe the Pain: Shooting Pain Management and Medication Current Pain Management: Electronic Signature(s) Signed: 08/23/2021 4:00:05 PM By: Gretta Cool, [name], RN, CWS, [name], [name] Entered By: Gretta Cool, [name], RN, CWS, Kim on 08/22/2021 14:32:30 Jenna [name] ([id number]) -------------------------------------------------------------------------------- Patient/Caregiver Education Details Patient Name: Jenna [name] Date of Service: 08/22/2021 2:00 PM Medical Record Number: [id number] Patient Account Number: [id number] Date of Birth/Gender: 04-Jul-1931 (87 y.o. F) Treating RN: Cornell Barman Primary Care Physician: Lujean Amel Other Clinician: Referring Physician: Referral, Self Treating

## 2021-08-26 ENCOUNTER — Other Ambulatory Visit: Payer: Self-pay

## 2021-08-26 DIAGNOSIS — L97821 Non-pressure chronic ulcer of other part of left lower leg limited to breakdown of skin: Secondary | ICD-10-CM | POA: Diagnosis not present

## 2021-08-26 DIAGNOSIS — I872 Venous insufficiency (chronic) (peripheral): Secondary | ICD-10-CM | POA: Diagnosis not present

## 2021-08-26 DIAGNOSIS — E119 Type 2 diabetes mellitus without complications: Secondary | ICD-10-CM | POA: Diagnosis not present

## 2021-08-26 DIAGNOSIS — I890 Lymphedema, not elsewhere classified: Secondary | ICD-10-CM | POA: Diagnosis not present

## 2021-08-26 NOTE — Progress Notes (Signed)
LAKETA, CANGEMI ([id number]) Visit Report for 08/26/2021 Arrival Information Details Patient Name: Jenna [name], Jenna [name]. Date of Service: 08/26/2021 11:30 AM Medical Record Number: [id number] Patient Account Number: [id number] Date of Birth/Sex: [birth year] (87 y.o. F) Treating RN: Yevonne Pax Primary Care Varetta Chavers: Docia Chuck, Dibas Other Clinician: Referring Dagmawi Venable: Docia Chuck, Dibas Treating Briza Bark/Extender: Rowan Blase in Treatment: 0 Visit Information History Since Last Visit All ordered tests and consults were completed: No Patient Arrived: Ambulatory Added or deleted any medications: No Arrival Time: 11:29 Any new allergies or adverse reactions: No Accompanied By: self Had a fall or experienced change in No Transfer Assistance: None activities of daily living that may affect Patient Identification Verified: Yes risk of falls: Secondary Verification Process Completed: Yes Signs or symptoms of abuse/neglect since last visito No Patient Requires Transmission-Based No Hospitalized since last visit: No Precautions: Implantable device outside of the clinic excluding No Patient Has Alerts: Yes cellular tissue based products placed in the center Patient Alerts: Patient on Blood since last visit: Thinner Has Dressing in Place as Prescribed: Yes aspirin 325 Has Compression in Place as Prescribed: Yes Type II Diabetic ABI 03/25/2021 L)1.28 Pain Present Now: No Electronic Signature(s) Signed: 08/26/2021 1:47:40 PM By: Yevonne Pax RN Entered By: Yevonne Pax on 08/26/2021 11:43:28 Schoenherr, [name] ([id number]) -------------------------------------------------------------------------------- Clinic Level of Care Assessment Details Patient Name: Jenna [name] Date of Service: 08/26/2021 11:30 AM Medical Record Number: [id number] Patient Account Number: [id number] Date of Birth/Sex: 1931-09-23 (87 y.o. F) Treating RN: Yevonne Pax Primary Care Maythe Deramo: Docia Chuck, Dibas Other  Clinician: Referring Chihiro Frey: Docia Chuck, Dibas Treating Trelyn Vanderlinde/Extender: Rowan Blase in Treatment: 0 Clinic Level of Care Assessment Items TOOL 1 Quantity Score []  - Use when EandM and Procedure is performed on INITIAL visit 0 ASSESSMENTS - Nursing Assessment / Reassessment []  - General Physical Exam (combine w/ comprehensive assessment (listed just below) when performed on new 0 pt. evals) []  - 0 Comprehensive Assessment (HX, ROS, Risk Assessments, Wounds Hx, etc.) ASSESSMENTS - Wound and Skin Assessment / Reassessment []  - Dermatologic / Skin Assessment (not related to wound area) 0 ASSESSMENTS - Ostomy and/or Continence Assessment and Care []  - Incontinence Assessment and Management 0 []  - 0 Ostomy Care Assessment and Management (repouching, etc.) PROCESS - Coordination of Care []  - Simple Patient / Family Education for ongoing care 0 []  - 0 Complex (extensive) Patient / Family Education for ongoing care []  - 0 Staff obtains Chiropractor, Records, Test Results / Process Orders []  - 0 Staff telephones HHA, Nursing Homes / Clarify orders / etc []  - 0 Routine Transfer to another Facility (non-emergent condition) []  - 0 Routine Hospital Admission (non-emergent condition) []  - 0 New Admissions / Manufacturing engineer / Ordering NPWT, Apligraf, etc. []  - 0 Emergency Hospital Admission (emergent condition) PROCESS - Special Needs []  - Pediatric / Minor Patient Management 0 []  - 0 Isolation Patient Management []  - 0 Hearing / Language / Visual special needs []  - 0 Assessment of Community assistance (transportation, D/C planning, etc.) []  - 0 Additional assistance / Altered mentation []  - 0 Support Surface(s) Assessment (bed, cushion, seat, etc.) INTERVENTIONS - Miscellaneous []  - External ear exam 0 []  - 0 Patient Transfer (multiple staff / Nurse, adult / Similar devices) []  - 0 Simple Staple / Suture removal (25 or less) []  - 0 Complex Staple / Suture removal (26  or more) []  - 0 Hypo/Hyperglycemic Management (do not check if billed separately) []  - 0 Ankle / Brachial Index (ABI) - do not check  if billed separately Has the patient been seen at the hospital within the last three years: Yes Total Score: 0 Level Of Care: ____ Jenna [name] ([id number]) Electronic Signature(s) Signed: 08/26/2021 1:47:40 PM By: Yevonne Pax RN Entered By: Yevonne Pax on 08/26/2021 11:44:52 Alkins, [name] ([id number]) -------------------------------------------------------------------------------- Compression Therapy Details Patient Name: Jenna [name] Date of Service: 08/26/2021 11:30 AM Medical Record Number: [id number] Patient Account Number: [id number] Date of Birth/Sex: 08-Sep-1931 (87 y.o. F) Treating RN: Yevonne Pax Primary Care Lovena Kluck: Docia Chuck, Dibas Other Clinician: Referring Cheyne Boulden: Docia Chuck, Dibas Treating Shetara Launer/Extender: Rowan Blase in Treatment: 0 Compression Therapy Performed for Wound Assessment: Wound #5 Left,Distal,Medial Lower Leg Performed By: Clinician Yevonne Pax, RN Compression Type: Three Layer Electronic Signature(s) Signed: 08/26/2021 1:47:40 PM By: Yevonne Pax RN Entered By: Yevonne Pax on 08/26/2021 11:44:19 Jenna [name] ([id number]) -------------------------------------------------------------------------------- Encounter Discharge Information Details Patient Name: Jenna [name] Date of Service: 08/26/2021 11:30 AM Medical Record Number: [id number] Patient Account Number: [id number] Date of Birth/Sex: Apr 17, 1931 (87 y.o. F) Treating RN: Yevonne Pax Primary Care Mallie Linnemann: Docia Chuck, Dibas Other Clinician: Referring Laurabeth Yip: Docia Chuck, Dibas Treating Nasrin Lanzo/Extender: Rowan Blase in Treatment: 0 Encounter Discharge Information Items Discharge Condition: Stable Ambulatory Status: Ambulatory Discharge Destination: Home Transportation: Private Auto Accompanied By: self Schedule Follow-up  Appointment: Yes Clinical Summary of Care: Patient Declined Electronic Signature(s) Signed: 08/26/2021 1:47:40 PM By: Yevonne Pax RN Entered By: Yevonne Pax on 08/26/2021 11:44:47 Jenna [name] ([id number]) -------------------------------------------------------------------------------- Wound Assessment Details Patient Name: Jenna [name] Date of Service: 08/26/2021 11:30 AM Medical Record Number: [id number] Patient Account Number: [id number] Date of Birth/Sex: 1931-09-28 (87 y.o. F) Treating RN: Yevonne Pax Primary Care Arpan Eskelson: Docia Chuck, Dibas Other Clinician: Referring Demarquis Osley: Docia Chuck, Dibas Treating Shakiah Wester/Extender: Rowan Blase in Treatment: 0 Wound Status Wound Number: 5 Primary Venous Leg Ulcer Etiology: Wound Location: Left, Distal, Medial Lower Leg Wound Open Wounding Event: Gradually Appeared Status: Date Acquired: 08/15/2021 Comorbid Cataracts, Glaucoma, Lymphedema, Hypertension, Weeks Of Treatment: 0 History: Type II Diabetes, Osteoarthritis Clustered Wound: Yes Wound Measurements Length: (cm) 0.7 Width: (cm) 2.5 Depth: (cm) 0.1 Clustered Quantity: 2 Area: (cm) 1.374 Volume: (cm) 0.137 % Reduction in Area: 0% % Reduction in Volume: 0% Epithelialization: Small (1-33%) Tunneling: No Undermining: No Wound Description Classification: Full Thickness Without Exposed Support Structu Wound Margin: Flat and Intact Exudate Amount: Medium Exudate Type: Serous Exudate Color: amber res Foul Odor After Cleansing: No Slough/Fibrino Yes Wound Bed Granulation Amount: Medium (34-66%) Exposed Structure Granulation Quality: Red Fascia Exposed: No Necrotic Amount: Medium (34-66%) Fat Layer (Subcutaneous Tissue) Exposed: Yes Necrotic Quality: Adherent Slough Tendon Exposed: No Muscle Exposed: No Joint Exposed: No Bone Exposed: No Treatment Notes Wound #5 (Lower Leg) Wound Laterality: Left, Medial, Distal Cleanser Soap and Water Discharge  Instruction: Gently cleanse wound with antibacterial soap, rinse and pat dry prior to dressing wounds Peri-Wound Care Moisturizing Lotion Discharge Instruction: Suggestions: Theraderm, Eucerin, Cetaphil, or patient preference. Topical Primary Dressing AquacelAg Advantage Dressing, 2X2 (in/in) Discharge Instruction: Apply to wound as directed Secondary Dressing Secured With Compression Wrap AAYA, GADWAY ([id number]) Profore Lite LF 3 Multilayer Compression Bandaging System Discharge Instruction: Apply 3 multi-layer wrap as prescribed. unna paste to anchor Compression Stockings Add-Ons Electronic Signature(s) Signed: 08/26/2021 1:47:40 PM By: Yevonne Pax RN Entered By: Yevonne Pax on 08/26/2021 11:43:59

## 2021-08-28 DIAGNOSIS — H59032 Cystoid macular edema following cataract surgery, left eye: Secondary | ICD-10-CM | POA: Diagnosis not present

## 2021-08-28 DIAGNOSIS — H401123 Primary open-angle glaucoma, left eye, severe stage: Secondary | ICD-10-CM | POA: Diagnosis not present

## 2021-08-28 DIAGNOSIS — H25811 Combined forms of age-related cataract, right eye: Secondary | ICD-10-CM | POA: Diagnosis not present

## 2021-08-28 DIAGNOSIS — H401112 Primary open-angle glaucoma, right eye, moderate stage: Secondary | ICD-10-CM | POA: Diagnosis not present

## 2021-08-28 DIAGNOSIS — H43813 Vitreous degeneration, bilateral: Secondary | ICD-10-CM | POA: Diagnosis not present

## 2021-08-29 ENCOUNTER — Encounter: Payer: Medicare Other | Attending: Physician Assistant | Admitting: Physician Assistant

## 2021-08-29 ENCOUNTER — Other Ambulatory Visit: Payer: Self-pay

## 2021-08-29 DIAGNOSIS — I890 Lymphedema, not elsewhere classified: Secondary | ICD-10-CM | POA: Diagnosis not present

## 2021-08-29 DIAGNOSIS — I10 Essential (primary) hypertension: Secondary | ICD-10-CM | POA: Diagnosis not present

## 2021-08-29 DIAGNOSIS — M1990 Unspecified osteoarthritis, unspecified site: Secondary | ICD-10-CM | POA: Diagnosis not present

## 2021-08-29 DIAGNOSIS — I872 Venous insufficiency (chronic) (peripheral): Secondary | ICD-10-CM | POA: Diagnosis not present

## 2021-08-29 DIAGNOSIS — L97821 Non-pressure chronic ulcer of other part of left lower leg limited to breakdown of skin: Secondary | ICD-10-CM | POA: Insufficient documentation

## 2021-08-29 DIAGNOSIS — E11622 Type 2 diabetes mellitus with other skin ulcer: Secondary | ICD-10-CM | POA: Insufficient documentation

## 2021-08-29 DIAGNOSIS — L97822 Non-pressure chronic ulcer of other part of left lower leg with fat layer exposed: Secondary | ICD-10-CM | POA: Diagnosis not present

## 2021-08-29 NOTE — Progress Notes (Addendum)
Jenna [name], Jenna [name] ([id number]) Visit Report for 08/29/2021 Chief Complaint Document Details Patient Name: Jenna [name], Jenna [name]. Date of Service: 08/29/2021 2:45 PM Medical Record Number: [id number] Patient Account Number: [id number] Date of Birth/Sex: 1931/06/25 (87 y.o. F) Treating RN: Dolan Amen Primary Care Provider: Lujean Amel Other Clinician: Referring Provider: [name], Dibas Treating Provider/Extender: Skipper Cliche in Treatment: 1 Information Obtained from: Patient Chief Complaint Left LE Ulcer Electronic Signature(s) Signed: 08/29/2021 3:24:04 PM By: Worthy Keeler PA-C Entered By: Worthy Keeler on 08/29/2021 15:24:03 Jenna [name] ([id number]) -------------------------------------------------------------------------------- HPI Details Patient Name: Jenna [name] Date of Service: 08/29/2021 2:45 PM Medical Record Number: [id number] Patient Account Number: [id number] Date of Birth/Sex: 10/30/1931 (87 y.o. F) Treating RN: Dolan Amen Primary Care Provider: Lujean Amel Other Clinician: Referring Provider: [name], Dibas Treating Provider/Extender: Skipper Cliche in Treatment: 1 History of Present Illness Location: left lower extremity in the medial ankle region Quality: Patient reports experiencing a dull pain to affected area(s). Severity: Patient states wound are getting worse. Duration: Patient has had the wound for > 3 months prior to seeking treatment at the wound center Timing: Pain in wound is Intermittent (comes and goes Context: The wound appeared gradually over time Modifying Factors: Other treatment(s) tried include:[name] Associated Signs and Symptoms: Patient reports having increase swelling. HPI Description: 87 year old patient with a past medical history significant for venous stasis ulceration and diabetes mellitus was recently admitted to the hospital between 07/07/2016 and  07/09/2016. She was wound to have a nonpurulent cellulitis of the left lower extremity and was started on IV antibiotics which included vancomycin. He is discharged home with her and Unna's boots and oral doxycycline. During this admission there was no obvious DVT involving the left lower extremity. her past medical history significant for diabetes mellitus, hypertension, hyperlipidemia, varicose veins, status post right rotator cuff repair, knee surgery on the left,robotic abdominal hysterectomy, laparoscopic cholecystectomy. She is not a smoker. Venous study done on 07/18/2015 showed normal left extremity deep venous system with no evidence of DVT. There was extensive valvular incompetence and reflux throughout the left greater saphenous vein with direct the medication to subcutaneous varicose veins. Normal left small saphenous vein. I understand she was seen by vascular radiology group and the workup and recommendation was for endovenous ablation but due to family pressure she was not able to keep her appointment a year ago. She has not been wearing her compression stockings regularly. 05/26/18 READMISSION this is an 87 year old woman who was previously seen in this clinic in 2017 by Dr. Con Memos. She has chronic venous insufficiency with lymphedema and at that time had open area on the left medial calf and ankle area. Was recommended that she wear 20-30 mm compression stockings and the patient tells me that she has been compliant with this although I think her primary doctor recently told her that she didn't need to wear stockings out of fear it might cause arterial compression. She noticed edema and pain in the area a week to 2 ago. She saw her primary doctor and was given doxycycline and Silvadene cream to put on the area. She states things are a lot better. The patient has a history of type 2 diabetes on oral agents but she is unaware of her hemoglobin A1c for her blood glucose which she  doesn't check. She has hypertension, varicose veins, rotator cuff repair, knee surgery on the left, history of a laparoscopic cholecystectomy, lymphedema, obstructive sleep apnea, history  of endometrial CA. The patient has been to see vein and vascular in the past and I think has had an ablation of the left greater saphenous vein based on ultrasounds of the left leg IC dating back to 2017. Her most recent ultrasound was in May 2018 which showed no evidence of a DVT and continued durable closure of the treated segment of the left greater saphenous vein. Patent lateral accessory branch of the greater saphenous vein extending to the calf ABIs in our clinic were 1.05 on the right and 0.95 on the left 06/02/18 the patient's wound on the left medial lower calf is completely closed and epithelialized. She has new 20-30 mm below-knee stockings READMISSION 11/02/2019 This is a now 87 year old woman who has been in this clinic 2 times before. Most recently in 2019. She has chronic venous insufficiency with some degree of secondary lymphedema. She has had a history of a left greater saphenous vein ablation. When she is here in 2019 she had a wound on the left medial lower calf. This closed fairly easily. It was recommended she wear 20/30 mm below-knee stockings she is not compliant with this. She arrives in clinic with a 2-week history of a left medial lower extremity and ankle wound. Some of this has dry slough on it but most of it is already epithelialize she has been using a combination of Vaseline and/or Silvadene. She is not wearing any compression. She has a history of chronic venous reflux. There was recommendations in the past for ablations I do not know that she ever carried through with this. According to notes from 2016 this work-up was done via the interventional radiology group. We will need to research this. She is probably going to need a consultation ABIs in our clinic were 1.03 on the right  and 0.93 on the left 11/11; patient's wound looks as though it is epithelializing horizontal wound on the left medial lower extremity. She has a superior satellite lesion. She is complaining of a lot of pain in 3 layer compression. There have been recommendations for previous ablations I am not sure if she followed up on this. She has been noncompliant with stockings although she brought those into the clinic today. 11/18; the patient had a repeat venous reflux studies at vein and vascular. This did not show any DVT in the left lower extremity there was no evidence of chronic venous insufficiency and no evidence of superficial vein thrombosis. I looked back at previous studies done I think in interventional radiology in 2018 would suggest that there had been previous ablation of a segment of the left greater saphenous vein. By review of the new study I do not think the patient needs to see vein and vascular however I find these studies increasingly difficult to interpret. I am not sure that the patient has actually consistently worn compression stockings and I think that is the next step here. The wound is just about closed Jenna [name], Jenna [name] ([id number]) 11/22/2019 upon evaluation today patient actually appears to be healed based on what I am seeing today. She has been tolerating the dressing changes without complication. Fortunately there is no signs of active infection at this time. Overall I feel like even though this is the first time of seeing her that she has actually done extremely well with the current wound care measures again she has been under the care of Dr. Dellia Nims. 12/2 the patient was expected to be healed this week however she comes in with 3  small wounds that look much the same as the pictures from 2 weeks ago. These are all in the area of the left medial lower extremity. She was put into her own compression stockings last week I do not think the edema control was adequate in this area  for healing. We have been using Hydrofera Blue 12/9; wound is fully epithelialized but still looks vulnerable on the left medial lower extremity. Surrounding venous inflammation. I think she has lymphedema with fibrosed skin in the distal lower leg. [Inverted bottle sign]. She has had venous reflux studies that have not shown any of the superficial veins to be amenable to ablations. This is been repeated during this visit. I am not really convinced that she has been wearing compression stockings although she certainly has them. 12/23; patient's wound on the left medial lower extremity is totally healed. She has surrounding venous inflammation and skin damage related to there is also some degree of lymphedema and fibrosed skin in the distal lower extremity. She has her compression stocking Readmission: 03/25/2021 upon evaluation today patient appears to be doing excellent in regard to her wound all things considered. She does appear to have gotten very quickly this time which is great news compared to before when she tells me she let the wound get somewhat out of control. Nonetheless right now she tells me this has been present for about 3 weeks she has been using Vaseline on it. She does have a history of diabetes though she has not taken Metformin for years she sees her primary care provider on Friday and she will see what he says but she is hoping he would not put her back on this. Subsequently she also does have Lasix that she is not been taking it recently. She has a past medical history significant for venous stasis, lymphedema, diabetes mellitus type 2, and hypertension. 04/01/2021 on evaluation today patient appears to be doing well with regard to her wound. There is no signs of active infection at this time. No fevers, chills, nausea, vomiting, or diarrhea. 04/08/2021 upon evaluation today patient appears to be doing excellent in regard to her wounds currently. She has been tolerating the  dressing changes and in fact appears to be completely healed which is great news. Readmission: 08/22/2021 patient presents today for reevaluation here in the clinic concerning a reopening of the wound on the left medial ankle/lower leg region which is the same area I took care of earlier in the year between March and April. Fortunately there does not appear to be anything to do a peer which is good news. I do think that as before this can probably heal fatherly rapidly. Nonetheless I do believe that based on what we are seeing she probably needs a compression wrap at this time. Her medical history really is not changed. Her ABI back in March of this year was 1.28 on this left leg and was doing well. 08/29/2021 upon evaluation today patient appears to be doing well. With regard to her wound she is actually showing signs of good granulation. I am very pleased in that regard. There does not appear to be any evidence of active infection at this time. Electronic Signature(s) Signed: 08/29/2021 3:34:38 PM By: Worthy Keeler PA-C Entered By: Worthy Keeler on 08/29/2021 15:34:37 Bebeau, [name] ([id number]) -------------------------------------------------------------------------------- Physical Exam Details Patient Name: Jenna [name] Date of Service: 08/29/2021 2:45 PM Medical Record Number: [id number] Patient Account Number: [id number] Date of Birth/Sex: November 19, 1931 (87 y.o. F) Treating  RN: Dolan Amen Primary Care Provider: [name], Dibas Other Clinician: Referring Provider: [name], Dibas Treating Provider/Extender: Skipper Cliche in Treatment: 1 Constitutional Well-nourished and well-hydrated in no acute distress. Respiratory normal breathing without difficulty. Psychiatric this patient is able to make decisions and demonstrates good insight into disease process. Alert and Oriented x 3. pleasant and cooperative. Notes Upon inspection patient's wound bed showed signs of good  granulation epithelization at this point. Fortunately I do not see any signs of anything worsening significantly she is significantly better although it is actually somewhat dry as far as the drainage is concerned on the dressing. Overall I think that we are making headway but she still probably needs a couple weeks to get this completely closed. Electronic Signature(s) Signed: 08/29/2021 3:35:02 PM By: Worthy Keeler PA-C Entered By: Worthy Keeler on 08/29/2021 15:35:01 Jenna [name] ([id number]) -------------------------------------------------------------------------------- Physician Orders Details Patient Name: Jenna [name] Date of Service: 08/29/2021 2:45 PM Medical Record Number: [id number] Patient Account Number: [id number] Date of Birth/Sex: 1931-10-10 (87 y.o. F) Treating RN: Dolan Amen Primary Care Provider: [name], Dibas Other Clinician: Referring Provider: [name], Dibas Treating Provider/Extender: Skipper Cliche in Treatment: 1 Verbal / Phone Orders: No Diagnosis Coding ICD-10 Coding Code Description I87.2 Venous insufficiency (chronic) (peripheral) I89.0 Lymphedema, not elsewhere classified L97.821 Non-pressure chronic ulcer of other part of left lower leg limited to breakdown of skin E11.622 Type 2 diabetes mellitus with other skin ulcer I10 Essential (primary) hypertension Follow-up Appointments o Return Appointment in 1 week. o Nurse Visit as needed Bathing/ Shower/ Hygiene Wound #5 Left,Distal,Medial Lower Leg o May shower with wound dressing protected with water repellent cover or cast protector. Edema Control - Lymphedema / Segmental Compressive Device / Other Left Lower Extremity o Optional: One layer of unna paste to top of compression wrap (to act as an anchor). o Elevate, Exercise Daily and Avoid Standing for Long Periods of Time. o Elevate legs to the level of the heart and pump ankles as often as possible o Elevate leg(s)  parallel to the floor when sitting. Additional Orders / Instructions o Follow Nutritious Diet and Increase Protein Intake o Activity as tolerated Wound Treatment Wound #5 - Lower Leg Wound Laterality: Left, Medial, Distal Cleanser: Soap and Water 1 x Per Week/30 Days Discharge Instructions: Gently cleanse wound with antibacterial soap, rinse and pat dry prior to dressing wounds Peri-Wound Care: Moisturizing Lotion 1 x Per Week/30 Days Discharge Instructions: Suggestions: Theraderm, Eucerin, Cetaphil, or patient preference. Primary Dressing: Xeroform-HBD 2x2 (in/in) 1 x Per Week/30 Days Discharge Instructions: Apply Xeroform-HBD 2x2 (in/in) as directed Secondary Dressing: Gauze 1 x Per Week/30 Days Discharge Instructions: Apply dry gauze as secondary Compression Wrap: Profore Lite LF 3 Multilayer Compression Bandaging System 1 x Per Week/30 Days Discharge Instructions: Apply 3 multi-layer wrap as prescribed. Electronic Signature(s) Signed: 08/29/2021 4:48:21 PM By: Dolan Amen RN Signed: 08/29/2021 5:15:45 PM By: Worthy Keeler PA-C Entered By: Dolan Amen on 08/29/2021 15:34:09 Jenna [name] ([id number]) -------------------------------------------------------------------------------- Problem List Details Patient Name: Jenna [name], Jenna [name]. Date of Service: 08/29/2021 2:45 PM Medical Record Number: [id number] Patient Account Number: [id number] Date of Birth/Sex: 10-Jun-1931 (87 y.o. F) Treating RN: Dolan Amen Primary Care Provider: Lujean Amel Other Clinician: Referring Provider: [name], Dibas Treating Provider/Extender: Skipper Cliche in Treatment: 1 Active Problems ICD-10 Encounter Code Description Active Date MDM Diagnosis I87.2 Venous insufficiency (chronic) (peripheral) 08/22/2021 No Yes I89.0 Lymphedema, not elsewhere classified 08/22/2021 No Yes L97.821 Non-pressure chronic ulcer of other part  of left lower leg limited to 08/22/2021 No Yes breakdown of  skin E11.622 Type 2 diabetes mellitus with other skin ulcer 08/22/2021 No Yes I10 Essential (primary) hypertension 08/22/2021 No Yes Inactive Problems Resolved Problems Electronic Signature(s) Signed: 08/29/2021 3:23:32 PM By: Worthy Keeler PA-C Entered By: Worthy Keeler on 08/29/2021 15:23:32 Jenna [name] ([id number]) -------------------------------------------------------------------------------- Progress Note Details Patient Name: Jenna [name] Date of Service: 08/29/2021 2:45 PM Medical Record Number: [id number] Patient Account Number: [id number] Date of Birth/Sex: 03/04/1931 (87 y.o. F) Treating RN: Dolan Amen Primary Care Provider: Lujean Amel Other Clinician: Referring Provider: [name], Dibas Treating Provider/Extender: Skipper Cliche in Treatment: 1 Subjective Chief Complaint Information obtained from Patient Left LE Ulcer History of Present Illness (HPI) The following HPI elements were documented for the patient's wound: Location: left lower extremity in the medial ankle region Quality: Patient reports experiencing a dull pain to affected area(s). Severity: Patient states wound are getting worse. Duration: Patient has had the wound for > 3 months prior to seeking treatment at the wound center Timing: Pain in wound is Intermittent (comes and goes Context: The wound appeared gradually over time Modifying Factors: Other treatment(s) tried include:[name] Associated Signs and Symptoms: Patient reports having increase swelling. 87 year old patient with a past medical history significant for venous stasis ulceration and diabetes mellitus was recently admitted to the hospital between 07/07/2016 and 07/09/2016. She was wound to have a nonpurulent cellulitis of the left lower extremity and was started on IV antibiotics which included vancomycin. He is discharged home with her and Unna's boots and oral  doxycycline. During this admission there was no obvious DVT involving the left lower extremity. her past medical history significant for diabetes mellitus, hypertension, hyperlipidemia, varicose veins, status post right rotator cuff repair, knee surgery on the left,robotic abdominal hysterectomy, laparoscopic cholecystectomy. She is not a smoker. Venous study done on 07/18/2015 showed normal left extremity deep venous system with no evidence of DVT. There was extensive valvular incompetence and reflux throughout the left greater saphenous vein with direct the medication to subcutaneous varicose veins. Normal left small saphenous vein. I understand she was seen by vascular radiology group and the workup and recommendation was for endovenous ablation but due to family pressure she was not able to keep her appointment a year ago. She has not been wearing her compression stockings regularly. 05/26/18 READMISSION this is an 87 year old woman who was previously seen in this clinic in 2017 by Dr. Con Memos. She has chronic venous insufficiency with lymphedema and at that time had open area on the left medial calf and ankle area. Was recommended that she wear 20-30 mm compression stockings and the patient tells me that she has been compliant with this although I think her primary doctor recently told her that she didn't need to wear stockings out of fear it might cause arterial compression. She noticed edema and pain in the area a week to 2 ago. She saw her primary doctor and was given doxycycline and Silvadene cream to put on the area. She states things are a lot better. The patient has a history of type 2 diabetes on oral agents but she is unaware of her hemoglobin A1c for her blood glucose which she doesn't check. She has hypertension, varicose veins, rotator cuff repair, knee surgery on the left, history of a laparoscopic cholecystectomy, lymphedema, obstructive sleep apnea, history of endometrial CA. The  patient has been to see vein and vascular in the  past and I think has had an ablation of the left greater saphenous vein based on ultrasounds of the left leg IC dating back to 2017. Her most recent ultrasound was in May 2018 which showed no evidence of a DVT and continued durable closure of the treated segment of the left greater saphenous vein. Patent lateral accessory branch of the greater saphenous vein extending to the calf ABIs in our clinic were 1.05 on the right and 0.95 on the left 06/02/18 the patient's wound on the left medial lower calf is completely closed and epithelialized. She has new 20-30 mm below-knee stockings READMISSION 11/02/2019 This is a now 87 year old woman who has been in this clinic 2 times before. Most recently in 2019. She has chronic venous insufficiency with some degree of secondary lymphedema. She has had a history of a left greater saphenous vein ablation. When she is here in 2019 she had a wound on the left medial lower calf. This closed fairly easily. It was recommended she wear 20/30 mm below-knee stockings she is not compliant with this. She arrives in clinic with a 2-week history of a left medial lower extremity and ankle wound. Some of this has dry slough on it but most of it is already epithelialize she has been using a combination of Vaseline and/or Silvadene. She is not wearing any compression. She has a history of chronic venous reflux. There was recommendations in the past for ablations I do not know that she ever carried through with this. According to notes from 2016 this work-up was done via the interventional radiology group. We will need to research this. She is probably going to need a consultation ABIs in our clinic were 1.03 on the right and 0.93 on the left 11/11; patient's wound looks as though it is epithelializing horizontal wound on the left medial lower extremity. She has a superior satellite lesion. Jenna [name], Jenna [name] ([id number]) She is  complaining of a lot of pain in 3 layer compression. There have been recommendations for previous ablations I am not sure if she followed up on this. She has been noncompliant with stockings although she brought those into the clinic today. 11/18; the patient had a repeat venous reflux studies at vein and vascular. This did not show any DVT in the left lower extremity there was no evidence of chronic venous insufficiency and no evidence of superficial vein thrombosis. I looked back at previous studies done I think in interventional radiology in 2018 would suggest that there had been previous ablation of a segment of the left greater saphenous vein. By review of the new study I do not think the patient needs to see vein and vascular however I find these studies increasingly difficult to interpret. I am not sure that the patient has actually consistently worn compression stockings and I think that is the next step here. The wound is just about closed 11/22/2019 upon evaluation today patient actually appears to be healed based on what I am seeing today. She has been tolerating the dressing changes without complication. Fortunately there is no signs of active infection at this time. Overall I feel like even though this is the first time of seeing her that she has actually done extremely well with the current wound care measures again she has been under the care of Dr. Dellia Nims. 12/2 the patient was expected to be healed this week however she comes in with 3 small wounds that look much the same as the pictures from 2 weeks ago.  These are all in the area of the left medial lower extremity. She was put into her own compression stockings last week I do not think the edema control was adequate in this area for healing. We have been using Hydrofera Blue 12/9; wound is fully epithelialized but still looks vulnerable on the left medial lower extremity. Surrounding venous inflammation. I think she has lymphedema with  fibrosed skin in the distal lower leg. [Inverted bottle sign]. She has had venous reflux studies that have not shown any of the superficial veins to be amenable to ablations. This is been repeated during this visit. I am not really convinced that she has been wearing compression stockings although she certainly has them. 12/23; patient's wound on the left medial lower extremity is totally healed. She has surrounding venous inflammation and skin damage related to there is also some degree of lymphedema and fibrosed skin in the distal lower extremity. She has her compression stocking Readmission: 03/25/2021 upon evaluation today patient appears to be doing excellent in regard to her wound all things considered. She does appear to have gotten very quickly this time which is great news compared to before when she tells me she let the wound get somewhat out of control. Nonetheless right now she tells me this has been present for about 3 weeks she has been using Vaseline on it. She does have a history of diabetes though she has not taken Metformin for years she sees her primary care provider on Friday and she will see what he says but she is hoping he would not put her back on this. Subsequently she also does have Lasix that she is not been taking it recently. She has a past medical history significant for venous stasis, lymphedema, diabetes mellitus type 2, and hypertension. 04/01/2021 on evaluation today patient appears to be doing well with regard to her wound. There is no signs of active infection at this time. No fevers, chills, nausea, vomiting, or diarrhea. 04/08/2021 upon evaluation today patient appears to be doing excellent in regard to her wounds currently. She has been tolerating the dressing changes and in fact appears to be completely healed which is great news. Readmission: 08/22/2021 patient presents today for reevaluation here in the clinic concerning a reopening of the wound on the left  medial ankle/lower leg region which is the same area I took care of earlier in the year between March and April. Fortunately there does not appear to be anything to do a peer which is good news. I do think that as before this can probably heal fatherly rapidly. Nonetheless I do believe that based on what we are seeing she probably needs a compression wrap at this time. Her medical history really is not changed. Her ABI back in March of this year was 1.28 on this left leg and was doing well. 08/29/2021 upon evaluation today patient appears to be doing well. With regard to her wound she is actually showing signs of good granulation. I am very pleased in that regard. There does not appear to be any evidence of active infection at this time. Objective Constitutional Well-nourished and well-hydrated in no acute distress. Vitals Time Taken: 3:07 PM, Height: 60 in, Weight: 212 lbs, BMI: 41.4, Temperature: 98.4 F, Pulse: 71 bpm, Respiratory Rate: 18 breaths/min, Blood Pressure: 177/95 mmHg. Respiratory normal breathing without difficulty. Psychiatric this patient is able to make decisions and demonstrates good insight into disease process. Alert and Oriented x 3. pleasant and cooperative. General Notes: Upon inspection  patient's wound bed showed signs of good granulation epithelization at this point. Fortunately I do not see any signs of anything worsening significantly she is significantly better although it is actually somewhat dry as far as the drainage is concerned on the dressing. Overall I think that we are making headway but she still probably needs a couple weeks to get this completely closed. Integumentary (Hair, Skin) Wound #5 status is Open. Original cause of wound was Gradually Appeared. The date acquired was: 08/15/2021. The wound has been in treatment 1 weeks. The wound is located on the Left,Distal,Medial Lower Leg. The wound measures 1cm length x 2.5cm width x 0.1cm depth;  1.963cm^2 Jenna [name], Jenna [name]. ([id number]) area and 0.196cm^3 volume. There is Fat Layer (Subcutaneous Tissue) exposed. There is no tunneling or undermining noted. There is a medium amount of serosanguineous drainage noted. The wound margin is flat and intact. There is medium (34-66%) red granulation within the wound bed. There is a medium (34-66%) amount of necrotic tissue within the wound bed including Adherent Slough. Assessment Active Problems ICD-10 Venous insufficiency (chronic) (peripheral) Lymphedema, not elsewhere classified Non-pressure chronic ulcer of other part of left lower leg limited to breakdown of skin Type 2 diabetes mellitus with other skin ulcer Essential (primary) hypertension Procedures Wound #5 Pre-procedure diagnosis of Wound #5 is a Venous Leg Ulcer located on the Left,Distal,Medial Lower Leg . There was a Three Layer Compression Therapy Procedure with a pre-treatment ABI of 1.3 by Dolan Amen, RN. Post procedure Diagnosis Wound #5: Same as Pre-Procedure Plan Follow-up Appointments: Return Appointment in 1 week. Nurse Visit as needed Bathing/ Shower/ Hygiene: Wound #5 Left,Distal,Medial Lower Leg: May shower with wound dressing protected with water repellent cover or cast protector. Edema Control - Lymphedema / Segmental Compressive Device / Other: Optional: One layer of unna paste to top of compression wrap (to act as an anchor). Elevate, Exercise Daily and Avoid Standing for Long Periods of Time. Elevate legs to the level of the heart and pump ankles as often as possible Elevate leg(s) parallel to the floor when sitting. Additional Orders / Instructions: Follow Nutritious Diet and Increase Protein Intake Activity as tolerated WOUND #5: - Lower Leg Wound Laterality: Left, Medial, Distal Cleanser: Soap and Water 1 x Per Week/30 Days Discharge Instructions: Gently cleanse wound with antibacterial soap, rinse and pat dry prior to dressing  wounds Peri-Wound Care: Moisturizing Lotion 1 x Per Week/30 Days Discharge Instructions: Suggestions: Theraderm, Eucerin, Cetaphil, or patient preference. Primary Dressing: Xeroform-HBD 2x2 (in/in) 1 x Per Week/30 Days Discharge Instructions: Apply Xeroform-HBD 2x2 (in/in) as directed Secondary Dressing: Gauze 1 x Per Week/30 Days Discharge Instructions: Apply dry gauze as secondary Compression Wrap: Profore Lite LF 3 Multilayer Compression Bandaging System 1 x Per Week/30 Days Discharge Instructions: Apply 3 multi-layer wrap as prescribed. 1. Would recommend that we going to continue with the compression wrap I think that is doing a great job. She is in agreement with such. 2. We will also get a use Xeroform since this is so dry try to see if that will help keep it a little bit more moist and allow it to heal more effectively she is in agreement with that plan. We will see patient back for reevaluation in 1 week here in the clinic. If anything worsens or changes patient will contact our office for additional recommendations. Jenna [name], Jenna [name] ([id number]) Electronic Signature(s) Signed: 08/29/2021 3:35:22 PM By: Worthy Keeler PA-C Entered By: Worthy Keeler on 08/29/2021 15:35:22 Pattianne, Meline [name] ([id number]) --------------------------------------------------------------------------------  SuperBill Details Patient Name: Jenna [name], Jenna [name]. Date of Service: 08/29/2021 Medical Record Number: [id number] Patient Account Number: [id number] Date of Birth/Sex: 11-Nov-1931 (87 y.o. F) Treating RN: Dolan Amen Primary Care Provider: [name], [place] Other Clinician: Referring Provider: [name], [place] Treating Provider/Extender: Skipper Cliche in Treatment: 1 Diagnosis Coding ICD-10 Codes Code Description I87.2 Venous insufficiency (chronic) (peripheral) I89.0 Lymphedema, not elsewhere classified L97.821 Non-pressure chronic ulcer of other part of left lower leg limited to breakdown of  skin E11.622 Type 2 diabetes mellitus with other skin ulcer I10 Essential (primary) hypertension Facility Procedures CPT4 Code: [id number] Description: (Facility Use Only) [phone] - [place] LWR LT LEG Modifier: Quantity: 1 Physician Procedures CPT4 [id number] Description: [id number] - WC PHYS LEVEL 3 - EST PT Modifier: Quantity: 1 CPT4 Code: Description: ICD-10 Diagnosis Description I87.2 Venous insufficiency (chronic) (peripheral) I89.0 Lymphedema, not elsewhere classified L97.821 Non-pressure chronic ulcer of other part of left lower leg limited to bre E11.622 Type 2 diabetes mellitus with  other skin ulcer Modifier: akdown of skin Quantity: Electronic Signature(s) Signed: 08/29/2021 3:36:04 PM By: Worthy Keeler PA-C Entered By: Worthy Keeler on 08/29/2021 15:36:03

## 2021-08-29 NOTE — Progress Notes (Signed)
Date of Birth/Sex: 26-Jun-1931 (87 y.o. F) Treating RN: Dolan Amen Primary Care Azzan Butler: [name], Dibas Other Clinician: Referring Jocilynn Grade: [name], Dibas Treating Esmae Donathan/Extender: Skipper Cliche in Treatment: 1 Active Inactive Venous Leg Ulcer Nursing Diagnoses: Knowledge deficit related to disease process and management Potential for venous Insuffiency (use before diagnosis confirmed) Goals: Patient will maintain optimal edema control Date Initiated: 08/22/2021 Target Resolution Date: 08/29/2021 Goal Status: Active Patient/caregiver will verbalize understanding of disease process and disease management Date Initiated: 08/22/2021 Target Resolution Date: 08/29/2021 Goal Status: Active Verify adequate tissue perfusion prior to therapeutic compression application Date Initiated: 08/22/2021 Target Resolution Date: 08/29/2021 Goal Status:  Active Interventions: Assess peripheral edema status every visit. Compression as ordered Provide education on venous insufficiency Treatment Activities: Therapeutic compression applied : 08/22/2021 Notes: Wound/Skin Impairment Nursing Diagnoses: Impaired tissue integrity Goals: Patient/caregiver will verbalize understanding of skin care regimen Date Initiated: 08/22/2021 Target Resolution Date: 09/05/2021 Goal Status: Active Ulcer/skin breakdown will have a volume reduction of 30% by week 4 Date Initiated: 08/22/2021 Target Resolution Date: 09/19/2021 Goal Status: Active Interventions: Assess patient/caregiver ability to perform ulcer/skin care regimen upon admission and as needed Assess ulceration(s) every visit Treatment Activities: Skin care regimen initiated : 08/22/2021 Topical wound management initiated : 08/22/2021 Notes: Electronic Signature(s) Signed: 08/29/2021 3:21:57 PM By: Dolan Amen RN Cian[place], [place] ([id number]) Entered By: Dolan Amen on 08/29/2021 15:21:57 Jenna [name] ([id number]) -------------------------------------------------------------------------------- Pain Assessment Details Patient Name: Jenna [name] Date of Service: 08/29/2021 2:45 PM Medical Record Number: [id number] Patient Account Number: [id number] Date of Birth/Sex: Aug 01, 1931 (87 y.o. F) Treating RN: Dolan Amen Primary Care Jenna [name]: Jenna [name] Other Clinician: Referring Jenna [name]: [name], Dibas Treating Jenna [name]/Extender: Skipper Cliche in Treatment: 1 Active Problems Location of Pain Severity and Description of Pain Patient Has Paino No Site Locations Rate the pain. Current Pain Level: 0 Pain Management and Medication Current Pain Management: Electronic Signature(s) Signed: 08/29/2021 4:48:21 PM By: Dolan Amen RN Entered By: Dolan Amen on 08/29/2021 15:08:24 Jenna [name]  ([id number]) -------------------------------------------------------------------------------- Patient/Caregiver Education Details Patient Name: Jenna [name] Date of Service: 08/29/2021 2:45 PM Medical Record Number: [id number] Patient Account Number: [id number] Date of Birth/Gender: 1931/05/06 (87 y.o. F) Treating RN: Dolan Amen Primary Care Physician: Jenna [name] Other Clinician: Referring Physician: [name], Dibas Treating Physician/Extender: Skipper Cliche in Treatment: 1 Education Assessment Education Provided To: Patient Education Topics Provided Wound/Skin Impairment: Methods: Explain/Verbal Responses: State content correctly Electronic Signature(s) Signed: 08/29/2021 4:48:21 PM By: Dolan Amen RN Entered By: Dolan Amen on 08/29/2021 15:33:13 Jenna [name] ([id number]) -------------------------------------------------------------------------------- Wound Assessment Details Patient Name: Jenna [name] Date of Service: 08/29/2021 2:45 PM Medical Record Number: [id number] Patient Account Number: [id number] Date of Birth/Sex: 04/18/1931 (87 y.o. F) Treating RN: Dolan Amen Primary Care Jenna [name]: [name], Dibas Other Clinician: Referring Jenna [name]: [name], Dibas Treating Jenna [name]/Extender: Skipper Cliche in Treatment: 1 Wound Status Wound Number: 5 Primary Venous Leg Ulcer Etiology: Wound Location: Left, Distal, Medial Lower Leg Wound Status: Open Wounding Event: Gradually Appeared Comorbid Cataracts, Glaucoma, Lymphedema, Hypertension, Type Date Acquired: 08/15/2021 History: II Diabetes, Osteoarthritis Weeks Of Treatment: 1 Clustered Wound: Yes Photos Wound Measurements Length: (cm) 1 Width: (cm) 2.5 Depth: (cm) 0.1 Clustered Quantity: 2 Area: (cm) 1.963 Volume: (cm) 0.196 % Reduction in Area: -42.9% % Reduction in Volume: -43.1% Epithelialization: Medium (34-66%) Tunneling: No Undermining: No Wound Description Classification:  Full Thickness Without Exposed Support Structu Wound Margin: Flat and Intact Exudate Amount: Medium Exudate Type: Serosanguineous Exudate Color: red,  Date of Birth/Sex: 26-Jun-1931 (87 y.o. F) Treating RN: Dolan Amen Primary Care Azzan Butler: [name], Dibas Other Clinician: Referring Jocilynn Grade: [name], Dibas Treating Esmae Donathan/Extender: Skipper Cliche in Treatment: 1 Active Inactive Venous Leg Ulcer Nursing Diagnoses: Knowledge deficit related to disease process and management Potential for venous Insuffiency (use before diagnosis confirmed) Goals: Patient will maintain optimal edema control Date Initiated: 08/22/2021 Target Resolution Date: 08/29/2021 Goal Status: Active Patient/caregiver will verbalize understanding of disease process and disease management Date Initiated: 08/22/2021 Target Resolution Date: 08/29/2021 Goal Status: Active Verify adequate tissue perfusion prior to therapeutic compression application Date Initiated: 08/22/2021 Target Resolution Date: 08/29/2021 Goal Status:  Active Interventions: Assess peripheral edema status every visit. Compression as ordered Provide education on venous insufficiency Treatment Activities: Therapeutic compression applied : 08/22/2021 Notes: Wound/Skin Impairment Nursing Diagnoses: Impaired tissue integrity Goals: Patient/caregiver will verbalize understanding of skin care regimen Date Initiated: 08/22/2021 Target Resolution Date: 09/05/2021 Goal Status: Active Ulcer/skin breakdown will have a volume reduction of 30% by week 4 Date Initiated: 08/22/2021 Target Resolution Date: 09/19/2021 Goal Status: Active Interventions: Assess patient/caregiver ability to perform ulcer/skin care regimen upon admission and as needed Assess ulceration(s) every visit Treatment Activities: Skin care regimen initiated : 08/22/2021 Topical wound management initiated : 08/22/2021 Notes: Electronic Signature(s) Signed: 08/29/2021 3:21:57 PM By: Dolan Amen RN Cian[place], [place] ([id number]) Entered By: Dolan Amen on 08/29/2021 15:21:57 Jenna [name] ([id number]) -------------------------------------------------------------------------------- Pain Assessment Details Patient Name: Jenna [name] Date of Service: 08/29/2021 2:45 PM Medical Record Number: [id number] Patient Account Number: [id number] Date of Birth/Sex: Aug 01, 1931 (87 y.o. F) Treating RN: Dolan Amen Primary Care Jenna [name]: Jenna [name] Other Clinician: Referring Jenna [name]: [name], Dibas Treating Jenna [name]/Extender: Skipper Cliche in Treatment: 1 Active Problems Location of Pain Severity and Description of Pain Patient Has Paino No Site Locations Rate the pain. Current Pain Level: 0 Pain Management and Medication Current Pain Management: Electronic Signature(s) Signed: 08/29/2021 4:48:21 PM By: Dolan Amen RN Entered By: Dolan Amen on 08/29/2021 15:08:24 Jenna [name]  ([id number]) -------------------------------------------------------------------------------- Patient/Caregiver Education Details Patient Name: Jenna [name] Date of Service: 08/29/2021 2:45 PM Medical Record Number: [id number] Patient Account Number: [id number] Date of Birth/Gender: 1931/05/06 (87 y.o. F) Treating RN: Dolan Amen Primary Care Physician: Jenna [name] Other Clinician: Referring Physician: [name], Dibas Treating Physician/Extender: Skipper Cliche in Treatment: 1 Education Assessment Education Provided To: Patient Education Topics Provided Wound/Skin Impairment: Methods: Explain/Verbal Responses: State content correctly Electronic Signature(s) Signed: 08/29/2021 4:48:21 PM By: Dolan Amen RN Entered By: Dolan Amen on 08/29/2021 15:33:13 Jenna [name] ([id number]) -------------------------------------------------------------------------------- Wound Assessment Details Patient Name: Jenna [name] Date of Service: 08/29/2021 2:45 PM Medical Record Number: [id number] Patient Account Number: [id number] Date of Birth/Sex: 04/18/1931 (87 y.o. F) Treating RN: Dolan Amen Primary Care Jenna [name]: [name], Dibas Other Clinician: Referring Jenna [name]: [name], Dibas Treating Jenna [name]/Extender: Skipper Cliche in Treatment: 1 Wound Status Wound Number: 5 Primary Venous Leg Ulcer Etiology: Wound Location: Left, Distal, Medial Lower Leg Wound Status: Open Wounding Event: Gradually Appeared Comorbid Cataracts, Glaucoma, Lymphedema, Hypertension, Type Date Acquired: 08/15/2021 History: II Diabetes, Osteoarthritis Weeks Of Treatment: 1 Clustered Wound: Yes Photos Wound Measurements Length: (cm) 1 Width: (cm) 2.5 Depth: (cm) 0.1 Clustered Quantity: 2 Area: (cm) 1.963 Volume: (cm) 0.196 % Reduction in Area: -42.9% % Reduction in Volume: -43.1% Epithelialization: Medium (34-66%) Tunneling: No Undermining: No Wound Description Classification:  Full Thickness Without Exposed Support Structu Wound Margin: Flat and Intact Exudate Amount: Medium Exudate Type: Serosanguineous Exudate Color: red,  Minus Breeding Primary Care Rj Pedrosa: [name], Dibas Other Clinician: Referring Lenyx Boody: [name], Dibas Treating Micole Delehanty/Extender: Skipper Cliche in Treatment: 1 Compression Therapy Performed for Wound Assessment: Wound #5 Left,Distal,Medial Lower Leg Performed By: Clinician Dolan Amen, RN Compression Type: Three Layer Pre Treatment ABI: 1.3 Post Procedure Diagnosis Same as Pre-procedure Electronic Signature(s) Signed: 08/29/2021 4:48:21 PM By: Dolan Amen RN Entered By: Dolan Amen on 08/29/2021 15:30:14 Jenna [name] ([id number]) -------------------------------------------------------------------------------- Encounter Discharge Information Details Patient Name: Jenna [name] Date of Service: 08/29/2021 2:45 PM Medical Record Number: [id number] Patient Account Number: [id number] Date of Birth/Sex: 23-Sep-1931 (87 y.o. F) Treating RN: Dolan Amen Primary Care Jurnei Latini: [name], Dibas Other Clinician: Referring Shaniqua Guillot: [name], Dibas Treating Tekelia Kareem/Extender: Skipper Cliche in Treatment: 1 Encounter Discharge Information Items Discharge Condition: Stable Ambulatory Status: Cane Discharge Destination: Home Transportation: Private Auto Accompanied By: self Schedule Follow-up Appointment: Yes Clinical Summary of Care: Electronic Signature(s) Signed: 08/29/2021 4:48:21 PM By: Dolan Amen RN Entered By: Dolan Amen on 08/29/2021 15:45:05 Jenna [name] ([id number]) -------------------------------------------------------------------------------- Lower Extremity Assessment Details Patient Name: Jenna [name] Date of Service:  08/29/2021 2:45 PM Medical Record Number: [id number] Patient Account Number: [id number] Date of Birth/Sex: 01-24-1931 (87 y.o. F) Treating RN: Dolan Amen Primary Care Rynlee [place]: [name], Dibas Other Clinician: Referring Anguel Delapena: [name], Dibas Treating Chantell Kunkler/Extender: Jeri Cos Weeks in Treatment: 1 Edema Assessment Assessed: [Left: Yes] [Right: No] Edema: [Left: Ye] [Right: s] Calf Left: Right: Point of Measurement: 29 cm From Medial Instep 40 cm Ankle Left: Right: Point of Measurement: 10 cm From Medial Instep 22.5 cm Vascular Assessment Pulses: Dorsalis Pedis Palpable: [Left:Yes] Electronic Signature(s) Signed: 08/29/2021 4:48:21 PM By: Dolan Amen RN Entered By: Dolan Amen on 08/29/2021 15:16:57 Jenna [name] ([id number]) -------------------------------------------------------------------------------- Multi Wound Chart Details Patient Name: Jenna [name] Date of Service: 08/29/2021 2:45 PM Medical Record Number: [id number] Patient Account Number: [id number] Date of Birth/Sex: [birth year] (87 y.o. F) Treating RN: Dolan Amen Primary Care Cristopher Ciccarelli: [name], Dibas Other Clinician: Referring Janeann Paisley: [name], Dibas Treating Constantin Hillery/Extender: Skipper Cliche in Treatment: 1 Vital Signs Height(in): 60 Pulse(bpm): 37 Weight(lbs): 212 Blood Pressure(mmHg): 177/95 Body Mass Index(BMI): 41 Temperature(F): 98.4 Respiratory Rate(breaths/min): 18 Photos: [N/A:N/A] Wound Location: Left, Distal, Medial Lower Leg N/A N/A Wounding Event: Gradually Appeared N/A N/A Primary Etiology: Venous Leg Ulcer N/A N/A Comorbid History: Cataracts, Glaucoma, Lymphedema, N/A N/A Hypertension, Type II Diabetes, Osteoarthritis Date Acquired: 08/15/2021 N/A N/A Weeks of Treatment: 1 N/A N/A Wound Status: Open N/A N/A Clustered Wound: Yes N/A N/A Clustered Quantity: 2 N/A N/A Measurements L x W x D (cm) 1x2.5x0.1 N/A N/A Area (cm) : 1.963 N/A N/A Volume (cm) :  0.196 N/A N/A % Reduction in Area: -42.90% N/A N/A % Reduction in Volume: -43.10% N/A N/A Classification: Full Thickness Without Exposed N/A N/A Support Structures Exudate Amount: Medium N/A N/A Exudate Type: Serosanguineous N/A N/A Exudate Color: red, brown N/A N/A Wound Margin: Flat and Intact N/A N/A Granulation Amount: Medium (34-66%) N/A N/A Granulation Quality: Red N/A N/A Necrotic Amount: Medium (34-66%) N/A N/A Exposed Structures: Fat Layer (Subcutaneous Tissue): N/A N/A Yes Fascia: No Tendon: No Muscle: No Joint: No Bone: No Epithelialization: Medium (34-66%) N/A N/A Treatment Notes Electronic Signature(s) Signed: 08/29/2021 3:22:04 PM By: Dolan Amen RN Entered By: Dolan Amen on 08/29/2021 15:22:04 GERTIE, [place]BAN ([id number], DISANTI ([id number]) -------------------------------------------------------------------------------- [place] Details Patient Name: Jenna [name] Date of Service: 08/29/2021 2:45 PM Medical Record Number: [id number] Patient Account Number: [id number]  Minus Breeding Primary Care Rj Pedrosa: [name], Dibas Other Clinician: Referring Lenyx Boody: [name], Dibas Treating Micole Delehanty/Extender: Skipper Cliche in Treatment: 1 Compression Therapy Performed for Wound Assessment: Wound #5 Left,Distal,Medial Lower Leg Performed By: Clinician Dolan Amen, RN Compression Type: Three Layer Pre Treatment ABI: 1.3 Post Procedure Diagnosis Same as Pre-procedure Electronic Signature(s) Signed: 08/29/2021 4:48:21 PM By: Dolan Amen RN Entered By: Dolan Amen on 08/29/2021 15:30:14 Jenna [name] ([id number]) -------------------------------------------------------------------------------- Encounter Discharge Information Details Patient Name: Jenna [name] Date of Service: 08/29/2021 2:45 PM Medical Record Number: [id number] Patient Account Number: [id number] Date of Birth/Sex: 23-Sep-1931 (87 y.o. F) Treating RN: Dolan Amen Primary Care Jurnei Latini: [name], Dibas Other Clinician: Referring Shaniqua Guillot: [name], Dibas Treating Tekelia Kareem/Extender: Skipper Cliche in Treatment: 1 Encounter Discharge Information Items Discharge Condition: Stable Ambulatory Status: Cane Discharge Destination: Home Transportation: Private Auto Accompanied By: self Schedule Follow-up Appointment: Yes Clinical Summary of Care: Electronic Signature(s) Signed: 08/29/2021 4:48:21 PM By: Dolan Amen RN Entered By: Dolan Amen on 08/29/2021 15:45:05 Jenna [name] ([id number]) -------------------------------------------------------------------------------- Lower Extremity Assessment Details Patient Name: Jenna [name] Date of Service:  08/29/2021 2:45 PM Medical Record Number: [id number] Patient Account Number: [id number] Date of Birth/Sex: 01-24-1931 (87 y.o. F) Treating RN: Dolan Amen Primary Care Rynlee [place]: [name], Dibas Other Clinician: Referring Anguel Delapena: [name], [place] Treating Chantell Kunkler/Extender: Jeri Cos Weeks in Treatment: 1 Edema Assessment Assessed: [Left: Yes] [Right: No] Edema: [Left: Ye] [Right: s] Calf Left: Right: Point of Measurement: 29 cm From Medial Instep 40 cm Ankle Left: Right: Point of Measurement: 10 cm From Medial Instep 22.5 cm Vascular Assessment Pulses: Dorsalis Pedis Palpable: [Left:Yes] Electronic Signature(s) Signed: 08/29/2021 4:48:21 PM By: Dolan Amen RN Entered By: Dolan Amen on 08/29/2021 15:16:57 Jenna [name] ([id number]) -------------------------------------------------------------------------------- Multi Wound Chart Details Patient Name: Jenna [name] Date of Service: 08/29/2021 2:45 PM Medical Record Number: [id number] Patient Account Number: [id number] Date of Birth/Sex: [birth year] (87 y.o. F) Treating RN: Dolan Amen Primary Care Cristopher Ciccarelli: [name], [place]ibas Other Clinician: Referring Janeann Paisley: [name], [place] Treating Constantin Hillery/Extender: Skipper Cliche in Treatment: 1 Vital Signs Height(in): 60 Pulse(bpm): 37 Weight(lbs): 212 Blood Pressure(mmHg): 177/95 Body Mass Index(BMI): 41 Temperature(F): 98.4 Respiratory Rate(breaths/min): 18 Photos: [N/A:N/A] Wound Location: Left, Distal, Medial Lower Leg N/A N/A Wounding Event: Gradually Appeared N/A N/A Primary Etiology: Venous Leg Ulcer N/A N/A Comorbid History: Cataracts, Glaucoma, Lymphedema, N/A N/A Hypertension, Type II Diabetes, Osteoarthritis Date Acquired: 08/15/2021 N/A N/A Weeks of Treatment: 1 N/A N/A Wound Status: Open N/A N/A Clustered Wound: Yes N/A N/A Clustered Quantity: 2 N/A N/A Measurements L x W x D (cm) 1x2.5x0.1 N/A N/A Area (cm) : 1.963 N/A N/A Volume (cm) :  0.196 N/A N/A % Reduction in Area: -42.90% N/A N/A % Reduction in Volume: -43.10% N/A N/A Classification: Full Thickness Without Exposed N/A N/A Support Structures Exudate Amount: Medium N/A N/A Exudate Type: Serosanguineous N/A N/A Exudate Color: red, brown N/A N/A Wound Margin: Flat and Intact N/A N/A Granulation Amount: Medium (34-66%) N/A N/A Granulation Quality: Red N/A N/A Necrotic Amount: Medium (34-66%) N/A N/A Exposed Structures: Fat Layer (Subcutaneous Tissue): N/A N/A Yes Fascia: No Tendon: No Muscle: No Joint: No Bone: No Epithelialization: Medium (34-66%) N/A N/A Treatment Notes Electronic Signature(s) Signed: 08/29/2021 3:22:04 PM By: Dolan Amen RN Entered By: Dolan Amen on 08/29/2021 15:22:04 GERTIE, [place] ([id number], DISANTI ([id number]) -------------------------------------------------------------------------------- [place] Details Patient Name: Jenna [name] Date of Service: 08/29/2021 2:45 PM Medical Record Number: [id number] Patient Account Number: [id number]

## 2021-09-04 ENCOUNTER — Ambulatory Visit: Payer: Medicare Other | Admitting: Physical Therapy

## 2021-09-05 ENCOUNTER — Encounter: Payer: Self-pay | Admitting: Physical Therapy

## 2021-09-05 ENCOUNTER — Other Ambulatory Visit: Payer: Self-pay

## 2021-09-05 ENCOUNTER — Ambulatory Visit: Payer: Medicare Other | Attending: Family Medicine | Admitting: Physical Therapy

## 2021-09-05 DIAGNOSIS — M6281 Muscle weakness (generalized): Secondary | ICD-10-CM | POA: Insufficient documentation

## 2021-09-05 DIAGNOSIS — M542 Cervicalgia: Secondary | ICD-10-CM | POA: Diagnosis not present

## 2021-09-05 DIAGNOSIS — M25511 Pain in right shoulder: Secondary | ICD-10-CM | POA: Diagnosis not present

## 2021-09-05 DIAGNOSIS — R293 Abnormal posture: Secondary | ICD-10-CM | POA: Insufficient documentation

## 2021-09-05 NOTE — Therapy (Signed)
[facility] [address], [place], [place], [address] Phone: [phone]   Fax:  [phone]  Physical Therapy Evaluation  Patient Details  Name: Jenna [name] MRN: [id number] Date of Birth: Apr 02, 1931 Referring Provider (PT): Dibas [name], MD   Encounter Date: 09/05/2021   PT End of Session - 09/05/21 1140     Visit Number 1    Date for PT Re-Evaluation 10/17/21    Authorization Type Medicare A and B    Authorization Time Period 09/05/21 to 10/17/21    Progress Note Due on Visit 10    PT Start Time 1017    PT Stop Time 1102    PT Time Calculation (min) 45 min    Activity Tolerance Patient tolerated treatment well;No increased pain             Past Medical History:  Diagnosis Date   Arthritis    Cancer ([place])    endometrial cancer   Cellulitis of left lower extremity    Diabetes mellitus    Hyperlipemia    Hypertension    Non-healing ulcer of lower leg (HCC)    Left leg     OAB (overactive bladder)    Obesity    BMI 45    Past Surgical History:  Procedure Laterality Date   ABDOMINAL HYSTERECTOMY  02/2008   robotic hyst BSO Bilateral LND   EYE SURGERY     cataract   IR GENERIC HISTORICAL  09/30/2016   IR EMBO VENOUS NOT HEMORR HEMANG  INC GUIDE ROADMAPPING [facility] ULTRASOUND   JOINT REPLACEMENT     knee   KNEE SURGERY     left knee   LAPAROSCOPIC CHOLECYSTECTOMY SINGLE PORT N/A 07/04/2014   Procedure: LAPAROSCOPIC CHOLECYSTECTOMY SINGLE PORT;  Surgeon: Adin Hector, MD;  Location: [facility];  Service: General;  Laterality: N/A;   ROTATOR CUFF REPAIR     right arm   TUBAL LIGATION      There were no vitals filed for this visit.    Subjective Assessment - 09/05/21 1024     Subjective Pt states that her Rt shoulder/neck started bothering her about 3-4 weeks ago. She would notice pain when trying to prop on her Rt elbow or when turning the head either direction. She denies numbness/tingling.     Pertinent History HTN, Diabetes, HLD    Diagnostic tests none recent    Patient Stated Goals decrease pain in the neck/shoulder    Currently in Pain? Yes    Pain Location Shoulder    Pain Orientation Right;Posterior    Pain Descriptors / Indicators Aching;Sore    Pain Type Acute pain    Pain Radiating Towards sometimes from the back of the shoulder down to the elbow    Pain Onset 1 to 4 weeks ago    Pain Frequency Intermittent    Aggravating Factors  tilting her head, looking left or right, propping on the Rt arm or reaching overhead    Pain Relieving Factors medication    Effect of Pain on Daily Activities needs assistance with reaching overhead                [facility] PT Assessment - 09/05/21 0001       Assessment   Medical Diagnosis Cervicalgia    Referring Provider (PT) Dibas Koirala, MD    Onset Date/Surgical Date --   3-4 weeks ago   Hand Dominance Right    Next MD Visit none that she's  Patient will benefit from skilled therapeutic intervention in order to improve the following deficits and impairments:  Pain, Improper body mechanics, Postural dysfunction, Increased muscle spasms, Decreased coordination, Decreased activity tolerance, Decreased range of motion, Decreased strength, Impaired UE functional use, Impaired flexibility  Visit Diagnosis: Cervicalgia  Right shoulder pain, unspecified chronicity  Muscle weakness (generalized)     Problem List Patient Active Problem List   Diagnosis Date Noted   OSA (obstructive sleep apnea) 02/08/2017   Murmur, cardiac 12/31/2016   Morbid obesity ([place]) 12/31/2016   Cellulitis of left leg 07/07/2016   Venous stasis ulcer of left lower extremity ([place]) 07/07/2016   Chronic cholecystitis with calculus s/p lap chole 07/04/2014 07/03/2014   HTN (hypertension) 05/31/2012   Non-insulin dependent type 2 diabetes mellitus ([place]) 05/31/2012   Hypercholesteremia 05/31/2012   OAB (overactive bladder)    Malignant neoplasm of corpus uteri, except isthmus ([place]) 12/10/2011     Class: Stage 1  11:54 AM,09/05/21 Mikisha Roseland PT, DPT [facility] [address], [place], [place], [address] Phone: [phone]   Fax:  [phone]  Name: Jenna [name] MRN: [id number] Date of Birth: 1931-06-01  [facility] [address], [place], [place], [address] Phone: [phone]   Fax:  [phone]  Physical Therapy Evaluation  Patient Details  Name: Jenna [name] MRN: [id number] Date of Birth: Apr 02, 1931 Referring Provider (PT): Dibas [name], MD   Encounter Date: 09/05/2021   PT End of Session - 09/05/21 1140     Visit Number 1    Date for PT Re-Evaluation 10/17/21    Authorization Type Medicare A and B    Authorization Time Period 09/05/21 to 10/17/21    Progress Note Due on Visit 10    PT Start Time 1017    PT Stop Time 1102    PT Time Calculation (min) 45 min    Activity Tolerance Patient tolerated treatment well;No increased pain             Past Medical History:  Diagnosis Date   Arthritis    Cancer ([place])    endometrial cancer   Cellulitis of left lower extremity    Diabetes mellitus    Hyperlipemia    Hypertension    Non-healing ulcer of lower leg (HCC)    Left leg     OAB (overactive bladder)    Obesity    BMI 45    Past Surgical History:  Procedure Laterality Date   ABDOMINAL HYSTERECTOMY  02/2008   robotic hyst BSO Bilateral LND   EYE SURGERY     cataract   IR GENERIC HISTORICAL  09/30/2016   IR EMBO VENOUS NOT HEMORR HEMANG  INC GUIDE ROADMAPPING [facility] ULTRASOUND   JOINT REPLACEMENT     knee   KNEE SURGERY     left knee   LAPAROSCOPIC CHOLECYSTECTOMY SINGLE PORT N/A 07/04/2014   Procedure: LAPAROSCOPIC CHOLECYSTECTOMY SINGLE PORT;  Surgeon: Adin Hector, MD;  Location: [facility];  Service: General;  Laterality: N/A;   ROTATOR CUFF REPAIR     right arm   TUBAL LIGATION      There were no vitals filed for this visit.    Subjective Assessment - 09/05/21 1024     Subjective Pt states that her Rt shoulder/neck started bothering her about 3-4 weeks ago. She would notice pain when trying to prop on her Rt elbow or when turning the head either direction. She denies numbness/tingling.     Pertinent History HTN, Diabetes, HLD    Diagnostic tests none recent    Patient Stated Goals decrease pain in the neck/shoulder    Currently in Pain? Yes    Pain Location Shoulder    Pain Orientation Right;Posterior    Pain Descriptors / Indicators Aching;Sore    Pain Type Acute pain    Pain Radiating Towards sometimes from the back of the shoulder down to the elbow    Pain Onset 1 to 4 weeks ago    Pain Frequency Intermittent    Aggravating Factors  tilting her head, looking left or right, propping on the Rt arm or reaching overhead    Pain Relieving Factors medication    Effect of Pain on Daily Activities needs assistance with reaching overhead                [facility] PT Assessment - 09/05/21 0001       Assessment   Medical Diagnosis Cervicalgia    Referring Provider (PT) Dibas Koirala, MD    Onset Date/Surgical Date --   3-4 weeks ago   Hand Dominance Right    Next MD Visit none that she's  [facility] [address], [place], [place], [address] Phone: [phone]   Fax:  [phone]  Physical Therapy Evaluation  Patient Details  Name: Jenna [name] MRN: [id number] Date of Birth: Apr 02, 1931 Referring Provider (PT): Dibas [name], MD   Encounter Date: 09/05/2021   PT End of Session - 09/05/21 1140     Visit Number 1    Date for PT Re-Evaluation 10/17/21    Authorization Type Medicare A and B    Authorization Time Period 09/05/21 to 10/17/21    Progress Note Due on Visit 10    PT Start Time 1017    PT Stop Time 1102    PT Time Calculation (min) 45 min    Activity Tolerance Patient tolerated treatment well;No increased pain             Past Medical History:  Diagnosis Date   Arthritis    Cancer ([place])    endometrial cancer   Cellulitis of left lower extremity    Diabetes mellitus    Hyperlipemia    Hypertension    Non-healing ulcer of lower leg (HCC)    Left leg     OAB (overactive bladder)    Obesity    BMI 45    Past Surgical History:  Procedure Laterality Date   ABDOMINAL HYSTERECTOMY  02/2008   robotic hyst BSO Bilateral LND   EYE SURGERY     cataract   IR GENERIC HISTORICAL  09/30/2016   IR EMBO VENOUS NOT HEMORR HEMANG  INC GUIDE ROADMAPPING [facility] ULTRASOUND   JOINT REPLACEMENT     knee   KNEE SURGERY     left knee   LAPAROSCOPIC CHOLECYSTECTOMY SINGLE PORT N/A 07/04/2014   Procedure: LAPAROSCOPIC CHOLECYSTECTOMY SINGLE PORT;  Surgeon: Adin Hector, MD;  Location: [facility];  Service: General;  Laterality: N/A;   ROTATOR CUFF REPAIR     right arm   TUBAL LIGATION      There were no vitals filed for this visit.    Subjective Assessment - 09/05/21 1024     Subjective Pt states that her Rt shoulder/neck started bothering her about 3-4 weeks ago. She would notice pain when trying to prop on her Rt elbow or when turning the head either direction. She denies numbness/tingling.     Pertinent History HTN, Diabetes, HLD    Diagnostic tests none recent    Patient Stated Goals decrease pain in the neck/shoulder    Currently in Pain? Yes    Pain Location Shoulder    Pain Orientation Right;Posterior    Pain Descriptors / Indicators Aching;Sore    Pain Type Acute pain    Pain Radiating Towards sometimes from the back of the shoulder down to the elbow    Pain Onset 1 to 4 weeks ago    Pain Frequency Intermittent    Aggravating Factors  tilting her head, looking left or right, propping on the Rt arm or reaching overhead    Pain Relieving Factors medication    Effect of Pain on Daily Activities needs assistance with reaching overhead                [facility] PT Assessment - 09/05/21 0001       Assessment   Medical Diagnosis Cervicalgia    Referring Provider (PT) Dibas Koirala, MD    Onset Date/Surgical Date --   3-4 weeks ago   Hand Dominance Right    Next MD Visit none that she's

## 2021-09-05 NOTE — Patient Instructions (Signed)
Access Code: [email or web address] URL: [email or web address] Date: 09/05/2021 Prepared by: [place]  Exercises Shoulder Rolls in Sitting - 2 x daily - 7 x weekly - 3 sets - 10 reps Seated Shoulder External Rotation with Dumbbells - 2 x daily - 7 x weekly - 2 sets - 10 reps - 2-3 seconds hold

## 2021-09-06 ENCOUNTER — Encounter: Payer: Medicare Other | Admitting: Physician Assistant

## 2021-09-06 DIAGNOSIS — I890 Lymphedema, not elsewhere classified: Secondary | ICD-10-CM | POA: Diagnosis not present

## 2021-09-06 DIAGNOSIS — I10 Essential (primary) hypertension: Secondary | ICD-10-CM | POA: Diagnosis not present

## 2021-09-06 DIAGNOSIS — E11622 Type 2 diabetes mellitus with other skin ulcer: Secondary | ICD-10-CM | POA: Diagnosis not present

## 2021-09-06 DIAGNOSIS — L97822 Non-pressure chronic ulcer of other part of left lower leg with fat layer exposed: Secondary | ICD-10-CM | POA: Diagnosis not present

## 2021-09-06 DIAGNOSIS — L97821 Non-pressure chronic ulcer of other part of left lower leg limited to breakdown of skin: Secondary | ICD-10-CM | POA: Diagnosis not present

## 2021-09-06 DIAGNOSIS — I872 Venous insufficiency (chronic) (peripheral): Secondary | ICD-10-CM | POA: Diagnosis not present

## 2021-09-06 DIAGNOSIS — M1990 Unspecified osteoarthritis, unspecified site: Secondary | ICD-10-CM | POA: Diagnosis not present

## 2021-09-06 NOTE — Progress Notes (Signed)
[id number] Date of Birth/Sex: 05-Sep-1931 (87 y.o. F) Treating RN: Dolan Amen Primary Care Andray Assefa: [name], Dibas Other Clinician: Referring Willam Munford: [name], Dibas Treating Ramiel Forti/Extender: Skipper Cliche in Treatment: 2 Active Inactive Venous Leg Ulcer Nursing Diagnoses: Knowledge deficit related to disease process and management Potential for venous Insuffiency (use before diagnosis confirmed) Goals: Patient will maintain optimal edema control Date Initiated: 08/22/2021 Target Resolution Date: 08/29/2021 Goal Status: Active Patient/caregiver will verbalize understanding of disease process and disease management Date Initiated: 08/22/2021 Target Resolution Date: 08/29/2021 Goal Status: Active Verify adequate tissue perfusion prior to therapeutic compression application Date Initiated: 08/22/2021 Target Resolution Date: 08/29/2021 Goal Status:  Active Interventions: Assess peripheral edema status every visit. Compression as ordered Provide education on venous insufficiency Treatment Activities: Therapeutic compression applied : 08/22/2021 Notes: Wound/Skin Impairment Nursing Diagnoses: Impaired tissue integrity Goals: Patient/caregiver will verbalize understanding of skin care regimen Date Initiated: 08/22/2021 Target Resolution Date: 09/05/2021 Goal Status: Active Ulcer/skin breakdown will have a volume reduction of 30% by week 4 Date Initiated: 08/22/2021 Target Resolution Date: 09/19/2021 Goal Status: Active Interventions: Assess patient/caregiver ability to perform ulcer/skin care regimen upon admission and as needed Assess ulceration(s) every visit Treatment Activities: Skin care regimen initiated : 08/22/2021 Topical wound management initiated : 08/22/2021 Notes: Electronic Signature(s) Signed: 09/06/2021 4:22:57 PM By: Dolan Amen RN Jenna [name], Jenna [name] ([id number]) Entered By: Dolan Amen on 09/06/2021 13:05:37 Jenna [name] ([id number]) -------------------------------------------------------------------------------- Pain Assessment Details Patient Name: Jenna [name] Date of Service: 09/06/2021 12:45 PM Medical Record Number: [id number] Patient Account Number: [id number] Date of Birth/Sex: 28-Mar-1931 (87 y.o. F) Treating RN: Dolan Amen Primary Care Nakira Litzau: Lujean Amel Other Clinician: Referring Shelva Hetzer: [name], Dibas Treating Laurrie Toppin/Extender: Skipper Cliche in Treatment: 2 Active Problems Location of Pain Severity and Description of Pain Patient Has Paino No Site Locations Rate the pain. Current Pain Level: 0 Pain Management and Medication Current Pain Management: Electronic Signature(s) Signed: 09/06/2021 4:22:57 PM By: Dolan Amen RN Entered By: Dolan Amen on 09/06/2021 12:57:42 Jenna [name]  ([id number]) -------------------------------------------------------------------------------- Patient/Caregiver Education Details Patient Name: Jenna [name] Date of Service: 09/06/2021 12:45 PM Medical Record Number: [id number] Patient Account Number: [id number] Date of Birth/Gender: 02/04/1931 (87 y.o. F) Treating RN: Dolan Amen Primary Care Physician: Lujean Amel Other Clinician: Referring Physician: [name], Dibas Treating Physician/Extender: Skipper Cliche in Treatment: 2 Education Assessment Education Provided To: Patient Education Topics Provided Wound/Skin Impairment: Methods: Explain/Verbal Responses: State content correctly Electronic Signature(s) Signed: 09/06/2021 4:22:57 PM By: Dolan Amen RN Entered By: Dolan Amen on 09/06/2021 13:12:12 Jenna [name] ([id number]) -------------------------------------------------------------------------------- Wound Assessment Details Patient Name: Jenna [name] Date of Service: 09/06/2021 12:45 PM Medical Record Number: [id number] Patient Account Number: [id number] Date of Birth/Sex: [birth year] (87 y.o. F) Treating RN: Dolan Amen Primary Care Niya Behler: [name], Dibas Other Clinician: Referring Milaina Sher: [name], Dibas Treating Dayten Juba/Extender: Skipper Cliche in Treatment: 2 Wound Status Wound Number: 5 Primary Venous Leg Ulcer Etiology: Wound Location: Left, Distal, Medial Lower Leg Wound Status: Open Wounding Event: Gradually Appeared Comorbid Cataracts, Glaucoma, Lymphedema, Hypertension, Type Date Acquired: 08/15/2021 History: II Diabetes, Osteoarthritis Weeks Of Treatment: 2 Clustered Wound: Yes Photos Wound Measurements Length: (cm) 1 Width: (cm) 2.5 Depth: (cm) 0.1 Clustered Quantity: 2 Area: (cm) 1.963 Volume: (cm) 0.196 % Reduction in Area: -42.9% % Reduction in Volume: -43.1% Epithelialization: Large (67-100%) Tunneling: No Undermining: No Wound  Description Classification: Full Thickness Without Exposed Support Structu Wound Margin: Flat and Intact Exudate Amount: Medium Exudate Type: Serosanguineous Exudate Color:  Jenna [name], Jenna [name] ([id number]) Visit Report for 09/06/2021 Arrival Information Details Patient Name: Jenna [name], Jenna [name]. Date of Service: 09/06/2021 12:45 PM Medical Record Number: [id number] Patient Account Number: [id number] Date of Birth/Sex: Mar 21, 1931 (87 y.o. F) Treating RN: Dolan Amen Primary Care Ajmal Kathan: [name], Dibas Other Clinician: Referring Neliah Cuyler: [name], Dibas Treating Rakiyah Esch/Extender: Skipper Cliche in Treatment: 2 Visit Information History Since Last Visit Pain Present Now: No Patient Arrived: Cane Arrival Time: 12:56 Accompanied By: self Transfer Assistance: None Patient Identification Verified: Yes Secondary Verification Process Completed: Yes Patient Requires Transmission-Based No Precautions: Patient Has Alerts: Yes Patient Alerts: Patient on Blood Thinner aspirin 325 Type II Diabetic ABI 03/25/2021 L)1.28 Electronic Signature(s) Signed: 09/06/2021 4:22:57 PM By: Dolan Amen RN Entered By: Dolan Amen on 09/06/2021 12:56:29 Jenna [name] ([id number]) -------------------------------------------------------------------------------- Clinic Level of Care Assessment Details Patient Name: Jenna [name] Date of Service: 09/06/2021 12:45 PM Medical Record Number: [id number] Patient Account Number: [id number] Date of Birth/Sex: 1931-03-11 (87 y.o. F) Treating RN: Dolan Amen Primary Care Zarianna Dicarlo: [name], Dibas Other Clinician: Referring Adrielle Polakowski: [name], Dibas Treating Laymond Postle/Extender: Skipper Cliche in Treatment: 2 Clinic Level of Care Assessment Items TOOL 1 Quantity Score '[]'$  - Use when EandM and Procedure is performed on INITIAL visit 0 ASSESSMENTS - Nursing Assessment / Reassessment '[]'$  - General Physical Exam (combine w/ comprehensive assessment (listed just below) when performed on new 0 pt. evals) '[]'$  - 0 Comprehensive Assessment (HX, ROS, Risk Assessments, Wounds Hx, etc.) ASSESSMENTS - Wound and Skin Assessment /  Reassessment '[]'$  - Dermatologic / Skin Assessment (not related to wound area) 0 ASSESSMENTS - Ostomy and/or Continence Assessment and Care '[]'$  - Incontinence Assessment and Management 0 '[]'$  - 0 Ostomy Care Assessment and Management (repouching, etc.) PROCESS - Coordination of Care '[]'$  - Simple Patient / Family Education for ongoing care 0 '[]'$  - 0 Complex (extensive) Patient / Family Education for ongoing care '[]'$  - 0 Staff obtains Programmer, systems, Records, Test Results / Process Orders '[]'$  - 0 Staff telephones HHA, Nursing Homes / Clarify orders / etc '[]'$  - 0 Routine Transfer to another Facility (non-emergent condition) '[]'$  - 0 Routine Hospital Admission (non-emergent condition) '[]'$  - 0 New Admissions / Biomedical engineer / Ordering NPWT, Apligraf, etc. '[]'$  - 0 Emergency Hospital Admission (emergent condition) PROCESS - Special Needs '[]'$  - Pediatric / Minor Patient Management 0 '[]'$  - 0 Isolation Patient Management '[]'$  - 0 Hearing / Language / Visual special needs '[]'$  - 0 Assessment of Community assistance (transportation, D/C planning, etc.) '[]'$  - 0 Additional assistance / Altered mentation '[]'$  - 0 Support Surface(s) Assessment (bed, cushion, seat, etc.) INTERVENTIONS - Miscellaneous '[]'$  - External ear exam 0 '[]'$  - 0 Patient Transfer (multiple staff / Civil Service fast streamer / Similar devices) '[]'$  - 0 Simple Staple / Suture removal (25 or less) '[]'$  - 0 Complex Staple / Suture removal (26 or more) '[]'$  - 0 Hypo/Hyperglycemic Management (do not check if billed separately) '[]'$  - 0 Ankle / Brachial Index (ABI) - do not check if billed separately Has the patient been seen at the hospital within the last three years: Yes Total Score: 0 Level Of Care: ____ Jenna [name] ([id number]) Electronic Signature(s) Signed: 09/06/2021 4:22:57 PM By: Dolan Amen RN Entered By: Dolan Amen on 09/06/2021 13:11:51 Jenna [name]  ([id number]) -------------------------------------------------------------------------------- Compression Therapy Details Patient Name: Jenna [name] Date of Service: 09/06/2021 12:45 PM Medical Record Number: [id number] Patient Account Number: [id number] Date of Birth/Sex: 28-Jul-1931 (87 y.o. F) Treating RN: Laurin Coder,  red, brown res Foul Odor After Cleansing: No Slough/Fibrino No Wound Bed Granulation Amount: Large (67-100%) Exposed Structure Granulation Quality: Red Fascia Exposed: No Necrotic Amount: None Present (0%) Fat Layer (Subcutaneous Tissue) Exposed: Yes Tendon Exposed: No Muscle Exposed: No Joint Exposed: No Bone Exposed: No Treatment Notes Wound #5 (Lower Leg) Wound Laterality: Left, Medial, Distal Cleanser Soap and Water Discharge Instruction: Gently cleanse wound with antibacterial soap, rinse and pat dry prior to dressing wounds KINDRED, THRAILKILL ([id number]) [place] Lotion Discharge Instruction: Suggestions: Theraderm, Eucerin, Cetaphil, or patient preference. Topical Primary Dressing Xeroform-HBD 2x2 (in/in) Discharge Instruction: Apply Xeroform-HBD 2x2 (in/in) as directed Secondary Dressing Gauze Discharge Instruction: Apply dry gauze as secondary Secured With Compression Wrap Profore Lite LF 3 Multilayer Compression Bandaging System Discharge Instruction: Apply 3 multi-layer wrap as prescribed. Compression Stockings Add-Ons Electronic Signature(s) Signed: 09/06/2021 4:22:57 PM By: Dolan Amen RN Entered By: Dolan Amen on 09/06/2021 13:02:59 Jenna [name] ([id number]) -------------------------------------------------------------------------------- Vitals Details Patient Name: Jenna [name] Date of Service: 09/06/2021 12:45 PM Medical Record Number: [id number] Patient Account Number: [id number] Date of Birth/Sex: 29-Jun-1931 (87 y.o. F) Treating RN: Dolan Amen Primary Care Torre Schaumburg: [name], Dibas Other Clinician: Referring Nioma Mccubbins: [name], Dibas Treating Janira Mandell/Extender: Skipper Cliche in Treatment: 2 Vital Signs Time Taken: 12:56 Temperature  (F): 98.3 Height (in): 60 Pulse (bpm): 88 Weight (lbs): 212 Respiratory Rate (breaths/min): 18 Body Mass Index (BMI): 41.4 Blood Pressure (mmHg): 170/83 Reference Range: 80 - 120 mg / dl Electronic Signature(s) Signed: 09/06/2021 4:22:57 PM By: Dolan Amen RN Entered By: Dolan Amen on 09/06/2021 12:57:36  Jenna [name], Jenna [name] ([id number]) Visit Report for 09/06/2021 Arrival Information Details Patient Name: Jenna [name], Jenna [name]. Date of Service: 09/06/2021 12:45 PM Medical Record Number: [id number] Patient Account Number: [id number] Date of Birth/Sex: Mar 21, 1931 (87 y.o. F) Treating RN: Dolan Amen Primary Care Ajmal Kathan: [name], Dibas Other Clinician: Referring Neliah Cuyler: [name], Dibas Treating Rakiyah Esch/Extender: Skipper Cliche in Treatment: 2 Visit Information History Since Last Visit Pain Present Now: No Patient Arrived: Cane Arrival Time: 12:56 Accompanied By: self Transfer Assistance: None Patient Identification Verified: Yes Secondary Verification Process Completed: Yes Patient Requires Transmission-Based No Precautions: Patient Has Alerts: Yes Patient Alerts: Patient on Blood Thinner aspirin 325 Type II Diabetic ABI 03/25/2021 L)1.28 Electronic Signature(s) Signed: 09/06/2021 4:22:57 PM By: Dolan Amen RN Entered By: Dolan Amen on 09/06/2021 12:56:29 Jenna [name] ([id number]) -------------------------------------------------------------------------------- Clinic Level of Care Assessment Details Patient Name: Jenna [name] Date of Service: 09/06/2021 12:45 PM Medical Record Number: [id number] Patient Account Number: [id number] Date of Birth/Sex: 1931-03-11 (87 y.o. F) Treating RN: Dolan Amen Primary Care Zarianna Dicarlo: [name], Dibas Other Clinician: Referring Adrielle Polakowski: [name], Dibas Treating Laymond Postle/Extender: Skipper Cliche in Treatment: 2 Clinic Level of Care Assessment Items TOOL 1 Quantity Score '[]'$  - Use when EandM and Procedure is performed on INITIAL visit 0 ASSESSMENTS - Nursing Assessment / Reassessment '[]'$  - General Physical Exam (combine w/ comprehensive assessment (listed just below) when performed on new 0 pt. evals) '[]'$  - 0 Comprehensive Assessment (HX, ROS, Risk Assessments, Wounds Hx, etc.) ASSESSMENTS - Wound and Skin Assessment /  Reassessment '[]'$  - Dermatologic / Skin Assessment (not related to wound area) 0 ASSESSMENTS - Ostomy and/or Continence Assessment and Care '[]'$  - Incontinence Assessment and Management 0 '[]'$  - 0 Ostomy Care Assessment and Management (repouching, etc.) PROCESS - Coordination of Care '[]'$  - Simple Patient / Family Education for ongoing care 0 '[]'$  - 0 Complex (extensive) Patient / Family Education for ongoing care '[]'$  - 0 Staff obtains Programmer, systems, Records, Test Results / Process Orders '[]'$  - 0 Staff telephones HHA, Nursing Homes / Clarify orders / etc '[]'$  - 0 Routine Transfer to another Facility (non-emergent condition) '[]'$  - 0 Routine Hospital Admission (non-emergent condition) '[]'$  - 0 New Admissions / Biomedical engineer / Ordering NPWT, Apligraf, etc. '[]'$  - 0 Emergency Hospital Admission (emergent condition) PROCESS - Special Needs '[]'$  - Pediatric / Minor Patient Management 0 '[]'$  - 0 Isolation Patient Management '[]'$  - 0 Hearing / Language / Visual special needs '[]'$  - 0 Assessment of Community assistance (transportation, D/C planning, etc.) '[]'$  - 0 Additional assistance / Altered mentation '[]'$  - 0 Support Surface(s) Assessment (bed, cushion, seat, etc.) INTERVENTIONS - Miscellaneous '[]'$  - External ear exam 0 '[]'$  - 0 Patient Transfer (multiple staff / Civil Service fast streamer / Similar devices) '[]'$  - 0 Simple Staple / Suture removal (25 or less) '[]'$  - 0 Complex Staple / Suture removal (26 or more) '[]'$  - 0 Hypo/Hyperglycemic Management (do not check if billed separately) '[]'$  - 0 Ankle / Brachial Index (ABI) - do not check if billed separately Has the patient been seen at the hospital within the last three years: Yes Total Score: 0 Level Of Care: ____ Jenna [name] ([id number]) Electronic Signature(s) Signed: 09/06/2021 4:22:57 PM By: Dolan Amen RN Entered By: Dolan Amen on 09/06/2021 13:11:51 Jenna [name]  ([id number]) -------------------------------------------------------------------------------- Compression Therapy Details Patient Name: Jenna [name] Date of Service: 09/06/2021 12:45 PM Medical Record Number: [id number] Patient Account Number: [id number] Date of Birth/Sex: 28-Jul-1931 (87 y.o. F) Treating RN: Laurin Coder,

## 2021-09-06 NOTE — Progress Notes (Addendum)
MIAH, BRETON ([id number]) Visit Report for 09/06/2021 Chief Complaint Document Details Patient Name: Jenna [name], Jenna [name]. Date of Service: 09/06/2021 12:45 PM Medical Record Number: [id number] Patient Account Number: [id number] Date of Birth/Sex: [birth year] (87 y.o. F) Treating RN: Dolan Amen Primary Care Provider: Lujean Amel Other Clinician: Referring Provider: [name], Dibas Treating Provider/Extender: Skipper Cliche in Treatment: 2 Information Obtained from: Patient Chief Complaint Left LE Ulcer Electronic Signature(s) Signed: 09/06/2021 12:59:00 PM By: Worthy Keeler PA-C Entered By: Worthy Keeler on 09/06/2021 12:58:59 Jenna [name] ([id number]) -------------------------------------------------------------------------------- HPI Details Patient Name: Jenna [name] Date of Service: 09/06/2021 12:45 PM Medical Record Number: [id number] Patient Account Number: [id number] Date of Birth/Sex: January 02, 1931 (87 y.o. F) Treating RN: Dolan Amen Primary Care Provider: Lujean Amel Other Clinician: Referring Provider: [name], Dibas Treating Provider/Extender: Skipper Cliche in Treatment: 2 History of Present Illness Location: left lower extremity in the medial ankle region Quality: Patient reports experiencing a dull pain to affected area(s). Severity: Patient states wound are getting worse. Duration: Patient has had the wound for > 3 months prior to seeking treatment at the wound center Timing: Pain in wound is Intermittent (comes and goes Context: The wound appeared gradually over time Modifying Factors: Other treatment(s) tried include:[name] Associated Signs and Symptoms: Patient reports having increase swelling. HPI Description: 87 year old patient with a past medical history significant for venous stasis ulceration and diabetes mellitus was recently admitted to the hospital between 07/07/2016 and  07/09/2016. She was wound to have a nonpurulent cellulitis of the left lower extremity and was started on IV antibiotics which included vancomycin. He is discharged home with her and Unna's boots and oral doxycycline. During this admission there was no obvious DVT involving the left lower extremity. her past medical history significant for diabetes mellitus, hypertension, hyperlipidemia, varicose veins, status post right rotator cuff repair, knee surgery on the left,robotic abdominal hysterectomy, laparoscopic cholecystectomy. She is not a smoker. Venous study done on 07/18/2015 showed normal left extremity deep venous system with no evidence of DVT. There was extensive valvular incompetence and reflux throughout the left greater saphenous vein with direct the medication to subcutaneous varicose veins. Normal left small saphenous vein. I understand she was seen by vascular radiology group and the workup and recommendation was for endovenous ablation but due to family pressure she was not able to keep her appointment a year ago. She has not been wearing her compression stockings regularly. 05/26/18 READMISSION this is an 87 year old woman who was previously seen in this clinic in 2017 by Dr. Con Memos. She has chronic venous insufficiency with lymphedema and at that time had open area on the left medial calf and ankle area. Was recommended that she wear 20-30 mm compression stockings and the patient tells me that she has been compliant with this although I think her primary doctor recently told her that she didn't need to wear stockings out of fear it might cause arterial compression. She noticed edema and pain in the area a week to 2 ago. She saw her primary doctor and was given doxycycline and Silvadene cream to put on the area. She states things are a lot better. The patient has a history of type 2 diabetes on oral agents but she is unaware of her hemoglobin A1c for her blood glucose which she  doesn't check. She has hypertension, varicose veins, rotator cuff repair, knee surgery on the left, history of a laparoscopic cholecystectomy, lymphedema, obstructive sleep apnea, history  of endometrial CA. The patient has been to see vein and vascular in the past and I think has had an ablation of the left greater saphenous vein based on ultrasounds of the left leg IC dating back to 2017. Her most recent ultrasound was in May 2018 which showed no evidence of a DVT and continued durable closure of the treated segment of the left greater saphenous vein. Patent lateral accessory branch of the greater saphenous vein extending to the calf ABIs in our clinic were 1.05 on the right and 0.95 on the left 06/02/18 the patient's wound on the left medial lower calf is completely closed and epithelialized. She has new 20-30 mm below-knee stockings READMISSION 11/02/2019 This is a now 87 year old woman who has been in this clinic 2 times before. Most recently in 2019. She has chronic venous insufficiency with some degree of secondary lymphedema. She has had a history of a left greater saphenous vein ablation. When she is here in 2019 she had a wound on the left medial lower calf. This closed fairly easily. It was recommended she wear 20/30 mm below-knee stockings she is not compliant with this. She arrives in clinic with a 2-week history of a left medial lower extremity and ankle wound. Some of this has dry slough on it but most of it is already epithelialize she has been using a combination of Vaseline and/or Silvadene. She is not wearing any compression. She has a history of chronic venous reflux. There was recommendations in the past for ablations I do not know that she ever carried through with this. According to notes from 2016 this work-up was done via the interventional radiology group. We will need to research this. She is probably going to need a consultation ABIs in our clinic were 1.03 on the right  and 0.93 on the left 11/11; patient's wound looks as though it is epithelializing horizontal wound on the left medial lower extremity. She has a superior satellite lesion. She is complaining of a lot of pain in 3 layer compression. There have been recommendations for previous ablations I am not sure if she followed up on this. She has been noncompliant with stockings although she brought those into the clinic today. 11/18; the patient had a repeat venous reflux studies at vein and vascular. This did not show any DVT in the left lower extremity there was no evidence of chronic venous insufficiency and no evidence of superficial vein thrombosis. I looked back at previous studies done I think in interventional radiology in 2018 would suggest that there had been previous ablation of a segment of the left greater saphenous vein. By review of the new study I do not think the patient needs to see vein and vascular however I find these studies increasingly difficult to interpret. I am not sure that the patient has actually consistently worn compression stockings and I think that is the next step here. The wound is just about closed ODEAL, WELDEN ([id number]) 11/22/2019 upon evaluation today patient actually appears to be healed based on what I am seeing today. She has been tolerating the dressing changes without complication. Fortunately there is no signs of active infection at this time. Overall I feel like even though this is the first time of seeing her that she has actually done extremely well with the current wound care measures again she has been under the care of Dr. Dellia Nims. 12/2 the patient was expected to be healed this week however she comes in with 3  small wounds that look much the same as the pictures from 2 weeks ago. These are all in the area of the left medial lower extremity. She was put into her own compression stockings last week I do not think the edema control was adequate in this area  for healing. We have been using Hydrofera Blue 12/9; wound is fully epithelialized but still looks vulnerable on the left medial lower extremity. Surrounding venous inflammation. I think she has lymphedema with fibrosed skin in the distal lower leg. [Inverted bottle sign]. She has had venous reflux studies that have not shown any of the superficial veins to be amenable to ablations. This is been repeated during this visit. I am not really convinced that she has been wearing compression stockings although she certainly has them. 12/23; patient's wound on the left medial lower extremity is totally healed. She has surrounding venous inflammation and skin damage related to there is also some degree of lymphedema and fibrosed skin in the distal lower extremity. She has her compression stocking Readmission: 03/25/2021 upon evaluation today patient appears to be doing excellent in regard to her wound all things considered. She does appear to have gotten very quickly this time which is great news compared to before when she tells me she let the wound get somewhat out of control. Nonetheless right now she tells me this has been present for about 3 weeks she has been using Vaseline on it. She does have a history of diabetes though she has not taken Metformin for years she sees her primary care provider on Friday and she will see what he says but she is hoping he would not put her back on this. Subsequently she also does have Lasix that she is not been taking it recently. She has a past medical history significant for venous stasis, lymphedema, diabetes mellitus type 2, and hypertension. 04/01/2021 on evaluation today patient appears to be doing well with regard to her wound. There is no signs of active infection at this time. No fevers, chills, nausea, vomiting, or diarrhea. 04/08/2021 upon evaluation today patient appears to be doing excellent in regard to her wounds currently. She has been tolerating the  dressing changes and in fact appears to be completely healed which is great news. Readmission: 08/22/2021 patient presents today for reevaluation here in the clinic concerning a reopening of the wound on the left medial ankle/lower leg region which is the same area I took care of earlier in the year between March and April. Fortunately there does not appear to be anything to do a peer which is good news. I do think that as before this can probably heal fatherly rapidly. Nonetheless I do believe that based on what we are seeing she probably needs a compression wrap at this time. Her medical history really is not changed. Her ABI back in March of this year was 1.28 on this left leg and was doing well. 08/29/2021 upon evaluation today patient appears to be doing well. With regard to her wound she is actually showing signs of good granulation. I am very pleased in that regard. There does not appear to be any evidence of active infection at this time. 09/06/2021 upon evaluation today patient's wound actually showing signs of doing very well as far as granulation epithelization is concerned. Fortunately I do not see any need for sharp debridement today and very pleased in that regard. There does seem to be some issue here with still continued drainage but nonetheless I think that  the compression wrap is doing a very good job in helping to mitigate this as well. Overall I think that we are headed in an appropriate direction towards getting this completely closed. Electronic Signature(s) Signed: 09/06/2021 2:18:35 PM By: Worthy Keeler PA-C Entered By: Worthy Keeler on 09/06/2021 14:18:34 Jenna [name] ([id number]) -------------------------------------------------------------------------------- Physical Exam Details Patient Name: Jenna [name] Date of Service: 09/06/2021 12:45 PM Medical Record Number: [id number] Patient Account Number: [id number] Date of Birth/Sex: [birth year] (87 y.o. F) Treating  RN: Dolan Amen Primary Care Provider: Lujean Amel Other Clinician: Referring Provider: [name], Dibas Treating Provider/Extender: Skipper Cliche in Treatment: 2 Constitutional Well-nourished and well-hydrated in no acute distress. Respiratory normal breathing without difficulty. Psychiatric this patient is able to make decisions and demonstrates good insight into disease process. Alert and Oriented x 3. pleasant and cooperative. Notes Upon inspection patient's wound bed showed signs of good granulation epithelization at this point. Fortunately there does not appear to be any evidence of active infection and overall I think that we are headed in a good direction. No sharp debridement necessary today. Electronic Signature(s) Signed: 09/06/2021 2:18:51 PM By: Worthy Keeler PA-C Entered By: Worthy Keeler on 09/06/2021 14:18:51 Jenna [name] ([id number]) -------------------------------------------------------------------------------- Physician Orders Details Patient Name: Jenna [name] Date of Service: 09/06/2021 12:45 PM Medical Record Number: [id number] Patient Account Number: [id number] Date of Birth/Sex: 03/27/1931 (87 y.o. F) Treating RN: Dolan Amen Primary Care Provider: [name], Dibas Other Clinician: Referring Provider: [name], Dibas Treating Provider/Extender: Skipper Cliche in Treatment: 2 Verbal / Phone Orders: No Diagnosis Coding ICD-10 Coding Code Description I87.2 Venous insufficiency (chronic) (peripheral) I89.0 Lymphedema, not elsewhere classified L97.821 Non-pressure chronic ulcer of other part of left lower leg limited to breakdown of skin E11.622 Type 2 diabetes mellitus with other skin ulcer I10 Essential (primary) hypertension Follow-up Appointments o Return Appointment in 1 week. o Nurse Visit as needed Bathing/ Shower/ Hygiene o May shower with wound dressing protected with water repellent cover or cast protector. Edema  Control - Lymphedema / Segmental Compressive Device / Other Left Lower Extremity o Optional: One layer of unna paste to top of compression wrap (to act as an anchor). o Elevate, Exercise Daily and Avoid Standing for Long Periods of Time. o Elevate legs to the level of the heart and pump ankles as often as possible o Elevate leg(s) parallel to the floor when sitting. Additional Orders / Instructions o Follow Nutritious Diet and Increase Protein Intake o Activity as tolerated Wound Treatment Wound #5 - Lower Leg Wound Laterality: Left, Medial, Distal Cleanser: Soap and Water 1 x Per Week/30 Days Discharge Instructions: Gently cleanse wound with antibacterial soap, rinse and pat dry prior to dressing wounds Peri-Wound Care: Moisturizing Lotion 1 x Per Week/30 Days Discharge Instructions: Suggestions: Theraderm, Eucerin, Cetaphil, or patient preference. Primary Dressing: Xeroform-HBD 2x2 (in/in) 1 x Per Week/30 Days Discharge Instructions: Apply Xeroform-HBD 2x2 (in/in) as directed Secondary Dressing: Gauze 1 x Per Week/30 Days Discharge Instructions: Apply dry gauze as secondary Compression Wrap: Profore Lite LF 3 Multilayer Compression Bandaging System 1 x Per Week/30 Days Discharge Instructions: Apply 3 multi-layer wrap as prescribed. Electronic Signature(s) Signed: 09/06/2021 4:22:57 PM By: Dolan Amen RN Signed: 09/06/2021 4:41:08 PM By: Worthy Keeler PA-C Entered By: Dolan Amen on 09/06/2021 13:11:44 Jenna [name], Jenna [name] ([id number]) -------------------------------------------------------------------------------- Problem List Details Patient Name: Jenna [name], Jenna [name]. Date of Service: 09/06/2021 12:45 PM Medical Record Number: [id number] Patient Account Number: [id number] Date of Birth/Sex: 1931/06/02 (87  y.o. F) Treating RN: Dolan Amen Primary Care Provider: [name], Dibas Other Clinician: Referring Provider: [name], Dibas Treating Provider/Extender: Skipper Cliche in Treatment: 2 Active Problems ICD-10 Encounter Code Description Active Date MDM Diagnosis I87.2 Venous insufficiency (chronic) (peripheral) 08/22/2021 No Yes I89.0 Lymphedema, not elsewhere classified 08/22/2021 No Yes L97.821 Non-pressure chronic ulcer of other part of left lower leg limited to 08/22/2021 No Yes breakdown of skin E11.622 Type 2 diabetes mellitus with other skin ulcer 08/22/2021 No Yes I10 Essential (primary) hypertension 08/22/2021 No Yes Inactive Problems Resolved Problems Electronic Signature(s) Signed: 09/06/2021 12:58:50 PM By: Worthy Keeler PA-C Entered By: Worthy Keeler on 09/06/2021 12:58:50 Jenna [name] ([id number]) -------------------------------------------------------------------------------- Progress Note Details Patient Name: Jenna [name] Date of Service: 09/06/2021 12:45 PM Medical Record Number: [id number] Patient Account Number: [id number] Date of Birth/Sex: 06-17-1931 (87 y.o. F) Treating RN: Dolan Amen Primary Care Provider: Lujean Amel Other Clinician: Referring Provider: [name], Dibas Treating Provider/Extender: Skipper Cliche in Treatment: 2 Subjective Chief Complaint Information obtained from Patient Left LE Ulcer History of Present Illness (HPI) The following HPI elements were documented for the patient's wound: Location: left lower extremity in the medial ankle region Quality: Patient reports experiencing a dull pain to affected area(s). Severity: Patient states wound are getting worse. Duration: Patient has had the wound for > 3 months prior to seeking treatment at the wound center Timing: Pain in wound is Intermittent (comes and goes Context: The wound appeared gradually over time Modifying Factors: Other treatment(s) tried include:[name] Associated Signs and Symptoms: Patient reports having increase swelling. 87 year old patient with a past  medical history significant for venous stasis ulceration and diabetes mellitus was recently admitted to the hospital between 07/07/2016 and 07/09/2016. She was wound to have a nonpurulent cellulitis of the left lower extremity and was started on IV antibiotics which included vancomycin. He is discharged home with her and Unna's boots and oral doxycycline. During this admission there was no obvious DVT involving the left lower extremity. her past medical history significant for diabetes mellitus, hypertension, hyperlipidemia, varicose veins, status post right rotator cuff repair, knee surgery on the left,robotic abdominal hysterectomy, laparoscopic cholecystectomy. She is not a smoker. Venous study done on 07/18/2015 showed normal left extremity deep venous system with no evidence of DVT. There was extensive valvular incompetence and reflux throughout the left greater saphenous vein with direct the medication to subcutaneous varicose veins. Normal left small saphenous vein. I understand she was seen by vascular radiology group and the workup and recommendation was for endovenous ablation but due to family pressure she was not able to keep her appointment a year ago. She has not been wearing her compression stockings regularly. 05/26/18 READMISSION this is an 87 year old woman who was previously seen in this clinic in 2017 by Dr. Con Memos. She has chronic venous insufficiency with lymphedema and at that time had open area on the left medial calf and ankle area. Was recommended that she wear 20-30 mm compression stockings and the patient tells me that she has been compliant with this although I think her primary doctor recently told her that she didn't need to wear stockings out of fear it might cause arterial compression. She noticed edema and pain in the area a week to 2 ago. She saw her primary doctor and was given doxycycline and Silvadene cream to put on the area. She states things are a lot  better. The patient has a history of type  2 diabetes on oral agents but she is unaware of her hemoglobin A1c for her blood glucose which she doesn't check. She has hypertension, varicose veins, rotator cuff repair, knee surgery on the left, history of a laparoscopic cholecystectomy, lymphedema, obstructive sleep apnea, history of endometrial CA. The patient has been to see vein and vascular in the past and I think has had an ablation of the left greater saphenous vein based on ultrasounds of the left leg IC dating back to 2017. Her most recent ultrasound was in May 2018 which showed no evidence of a DVT and continued durable closure of the treated segment of the left greater saphenous vein. Patent lateral accessory branch of the greater saphenous vein extending to the calf ABIs in our clinic were 1.05 on the right and 0.95 on the left 06/02/18 the patient's wound on the left medial lower calf is completely closed and epithelialized. She has new 20-30 mm below-knee stockings READMISSION 11/02/2019 This is a now 87 year old woman who has been in this clinic 2 times before. Most recently in 2019. She has chronic venous insufficiency with some degree of secondary lymphedema. She has had a history of a left greater saphenous vein ablation. When she is here in 2019 she had a wound on the left medial lower calf. This closed fairly easily. It was recommended she wear 20/30 mm below-knee stockings she is not compliant with this. She arrives in clinic with a 2-week history of a left medial lower extremity and ankle wound. Some of this has dry slough on it but most of it is already epithelialize she has been using a combination of Vaseline and/or Silvadene. She is not wearing any compression. She has a history of chronic venous reflux. There was recommendations in the past for ablations I do not know that she ever carried through with this. According to notes from 2016 this work-up was done via the  interventional radiology group. We will need to research this. She is probably going to need a consultation ABIs in our clinic were 1.03 on the right and 0.93 on the left 11/11; patient's wound looks as though it is epithelializing horizontal wound on the left medial lower extremity. She has a superior satellite lesion. Jenna [name], Jenna [name] ([id number]) She is complaining of a lot of pain in 3 layer compression. There have been recommendations for previous ablations I am not sure if she followed up on this. She has been noncompliant with stockings although she brought those into the clinic today. 11/18; the patient had a repeat venous reflux studies at vein and vascular. This did not show any DVT in the left lower extremity there was no evidence of chronic venous insufficiency and no evidence of superficial vein thrombosis. I looked back at previous studies done I think in interventional radiology in 2018 would suggest that there had been previous ablation of a segment of the left greater saphenous vein. By review of the new study I do not think the patient needs to see vein and vascular however I find these studies increasingly difficult to interpret. I am not sure that the patient has actually consistently worn compression stockings and I think that is the next step here. The wound is just about closed 11/22/2019 upon evaluation today patient actually appears to be healed based on what I am seeing today. She has been tolerating the dressing changes without complication. Fortunately there is no signs of active infection at this time. Overall I feel like even though this is the  first time of seeing her that she has actually done extremely well with the current wound care measures again she has been under the care of Dr. Dellia Nims. 12/2 the patient was expected to be healed this week however she comes in with 3 small wounds that look much the same as the pictures from 2 weeks ago. These are all in the area  of the left medial lower extremity. She was put into her own compression stockings last week I do not think the edema control was adequate in this area for healing. We have been using Hydrofera Blue 12/9; wound is fully epithelialized but still looks vulnerable on the left medial lower extremity. Surrounding venous inflammation. I think she has lymphedema with fibrosed skin in the distal lower leg. [Inverted bottle sign]. She has had venous reflux studies that have not shown any of the superficial veins to be amenable to ablations. This is been repeated during this visit. I am not really convinced that she has been wearing compression stockings although she certainly has them. 12/23; patient's wound on the left medial lower extremity is totally healed. She has surrounding venous inflammation and skin damage related to there is also some degree of lymphedema and fibrosed skin in the distal lower extremity. She has her compression stocking Readmission: 03/25/2021 upon evaluation today patient appears to be doing excellent in regard to her wound all things considered. She does appear to have gotten very quickly this time which is great news compared to before when she tells me she let the wound get somewhat out of control. Nonetheless right now she tells me this has been present for about 3 weeks she has been using Vaseline on it. She does have a history of diabetes though she has not taken Metformin for years she sees her primary care provider on Friday and she will see what he says but she is hoping he would not put her back on this. Subsequently she also does have Lasix that she is not been taking it recently. She has a past medical history significant for venous stasis, lymphedema, diabetes mellitus type 2, and hypertension. 04/01/2021 on evaluation today patient appears to be doing well with regard to her wound. There is no signs of active infection at this time. No fevers, chills, nausea, vomiting,  or diarrhea. 04/08/2021 upon evaluation today patient appears to be doing excellent in regard to her wounds currently. She has been tolerating the dressing changes and in fact appears to be completely healed which is great news. Readmission: 08/22/2021 patient presents today for reevaluation here in the clinic concerning a reopening of the wound on the left medial ankle/lower leg region which is the same area I took care of earlier in the year between March and April. Fortunately there does not appear to be anything to do a peer which is good news. I do think that as before this can probably heal fatherly rapidly. Nonetheless I do believe that based on what we are seeing she probably needs a compression wrap at this time. Her medical history really is not changed. Her ABI back in March of this year was 1.28 on this left leg and was doing well. 08/29/2021 upon evaluation today patient appears to be doing well. With regard to her wound she is actually showing signs of good granulation. I am very pleased in that regard. There does not appear to be any evidence of active infection at this time. 09/06/2021 upon evaluation today patient's wound actually showing signs of  doing very well as far as granulation epithelization is concerned. Fortunately I do not see any need for sharp debridement today and very pleased in that regard. There does seem to be some issue here with still continued drainage but nonetheless I think that the compression wrap is doing a very good job in helping to mitigate this as well. Overall I think that we are headed in an appropriate direction towards getting this completely closed. Objective Constitutional Well-nourished and well-hydrated in no acute distress. Vitals Time Taken: 12:56 PM, Height: 60 in, Weight: 212 lbs, BMI: 41.4, Temperature: 98.3 F, Pulse: 88 bpm, Respiratory Rate: 18 breaths/min, Blood Pressure: 170/83 mmHg. Respiratory normal breathing without  difficulty. Psychiatric this patient is able to make decisions and demonstrates good insight into disease process. Alert and Oriented x 3. pleasant and cooperative. General Notes: Upon inspection patient's wound bed showed signs of good granulation epithelization at this point. Fortunately there does not appear to be any evidence of active infection and overall I think that we are headed in a good direction. No sharp debridement necessary today. Jenna [name], Jenna [name] ([id number]) Integumentary (Hair, Skin) Wound #5 status is Open. Original cause of wound was Gradually Appeared. The date acquired was: 08/15/2021. The wound has been in treatment 2 weeks. The wound is located on the Left,Distal,Medial Lower Leg. The wound measures 1cm length x 2.5cm width x 0.1cm depth; 1.963cm^2 area and 0.196cm^3 volume. There is Fat Layer (Subcutaneous Tissue) exposed. There is no tunneling or undermining noted. There is a medium amount of serosanguineous drainage noted. The wound margin is flat and intact. There is large (67-100%) red granulation within the wound bed. There is no necrotic tissue within the wound bed. Assessment Active Problems ICD-10 Venous insufficiency (chronic) (peripheral) Lymphedema, not elsewhere classified Non-pressure chronic ulcer of other part of left lower leg limited to breakdown of skin Type 2 diabetes mellitus with other skin ulcer Essential (primary) hypertension Procedures Wound #5 Pre-procedure diagnosis of Wound #5 is a Venous Leg Ulcer located on the Left,Distal,Medial Lower Leg . There was a Three Layer Compression Therapy Procedure with a pre-treatment ABI of 1.3 by Dolan Amen, RN. Post procedure Diagnosis Wound #5: Same as Pre-Procedure Plan Follow-up Appointments: Return Appointment in 1 week. Nurse Visit as needed Bathing/ Shower/ Hygiene: May shower with wound dressing protected with water repellent cover or cast protector. Edema Control - Lymphedema /  Segmental Compressive Device / Other: Optional: One layer of unna paste to top of compression wrap (to act as an anchor). Elevate, Exercise Daily and Avoid Standing for Long Periods of Time. Elevate legs to the level of the heart and pump ankles as often as possible Elevate leg(s) parallel to the floor when sitting. Additional Orders / Instructions: Follow Nutritious Diet and Increase Protein Intake Activity as tolerated WOUND #5: - Lower Leg Wound Laterality: Left, Medial, Distal Cleanser: Soap and Water 1 x Per Week/30 Days Discharge Instructions: Gently cleanse wound with antibacterial soap, rinse and pat dry prior to dressing wounds Peri-Wound Care: Moisturizing Lotion 1 x Per Week/30 Days Discharge Instructions: Suggestions: Theraderm, Eucerin, Cetaphil, or patient preference. Primary Dressing: Xeroform-HBD 2x2 (in/in) 1 x Per Week/30 Days Discharge Instructions: Apply Xeroform-HBD 2x2 (in/in) as directed Secondary Dressing: Gauze 1 x Per Week/30 Days Discharge Instructions: Apply dry gauze as secondary Compression Wrap: Profore Lite LF 3 Multilayer Compression Bandaging System 1 x Per Week/30 Days Discharge Instructions: Apply 3 multi-layer wrap as prescribed. 1. Would recommend currently that going continue with wound care measures as  before utilizing the Xeroform gauze which I think is actually doing a good job. 2. I am also can recommend that we continue with a 3 layer compression wrap which is doing a great job helping to heal the wound as well. 3. I am also can recommend the patient continue to elevate her legs much as possible. Jenna [name], Jenna [name] ([id number]) We will see patient back for reevaluation in 1 week here in the clinic. If anything worsens or changes patient will contact our office for additional recommendations. Electronic Signature(s) Signed: 09/06/2021 2:19:16 PM By: Worthy Keeler PA-C Entered By: Worthy Keeler on 09/06/2021 14:19:16 [place], Jenna [name]  ([id number]) -------------------------------------------------------------------------------- SuperBill Details Patient Name: Jenna [name] Date of Service: 09/06/2021 Medical Record Number: [id number] Patient Account Number: [id number] Date of Birth/Sex: [birth year] (87 y.o. F) Treating RN: Dolan Amen Primary Care Provider: [name], [place] Other Clinician: Referring Provider: [name], Dibas Treating Provider/Extender: Skipper Cliche in Treatment: 2 Diagnosis Coding ICD-10 Codes Code Description I87.2 Venous insufficiency (chronic) (peripheral) I89.0 Lymphedema, not elsewhere classified L97.821 Non-pressure chronic ulcer of other part of left lower leg limited to breakdown of skin E11.622 Type 2 diabetes mellitus with other skin ulcer I10 Essential (primary) hypertension Facility Procedures CPT4 Code: [id number] Description: (Facility Use Only) [phone] - [facility] LWR LT LEG Modifier: Quantity: 1 Physician Procedures CPT4 [id number] Description: [id number] - WC PHYS LEVEL 3 - EST PT Modifier: Quantity: 1 CPT4 Code: Description: ICD-10 Diagnosis Description I87.2 Venous insufficiency (chronic) (peripheral) I89.0 Lymphedema, not elsewhere classified L97.821 Non-pressure chronic ulcer of other part of left lower leg limited to bre E11.622 Type 2 diabetes mellitus with  other skin ulcer Modifier: akdown of skin Quantity: Electronic Signature(s) Signed: 09/06/2021 2:19:58 PM By: Worthy Keeler PA-C Entered By: Worthy Keeler on 09/06/2021 14:19:58

## 2021-09-11 ENCOUNTER — Ambulatory Visit: Payer: Medicare Other | Admitting: Physical Therapy

## 2021-09-11 ENCOUNTER — Other Ambulatory Visit: Payer: Self-pay

## 2021-09-11 ENCOUNTER — Encounter: Payer: Self-pay | Admitting: Physical Therapy

## 2021-09-11 DIAGNOSIS — M542 Cervicalgia: Secondary | ICD-10-CM

## 2021-09-11 DIAGNOSIS — M25511 Pain in right shoulder: Secondary | ICD-10-CM | POA: Diagnosis not present

## 2021-09-11 DIAGNOSIS — R293 Abnormal posture: Secondary | ICD-10-CM | POA: Diagnosis not present

## 2021-09-11 DIAGNOSIS — M6281 Muscle weakness (generalized): Secondary | ICD-10-CM

## 2021-09-11 NOTE — Therapy (Signed)
PT Home Exercise Plan [id number]    Consulted and Agree with Plan of Care Patient             Patient will benefit from skilled therapeutic intervention in order to improve the  following deficits and impairments:     Visit Diagnosis: Right shoulder pain, unspecified chronicity  Cervicalgia  Muscle weakness (generalized)     Problem List Patient Active Problem List   Diagnosis Date Noted   OSA (obstructive sleep apnea) 02/08/2017   Murmur, cardiac 12/31/2016   Morbid obesity ([place]) 12/31/2016   Cellulitis of left leg 07/07/2016   Venous stasis ulcer of left lower extremity ([place]) 07/07/2016   Chronic cholecystitis with calculus s/p lap chole 07/04/2014 07/03/2014   HTN (hypertension) 05/31/2012   Non-insulin dependent type 2 diabetes mellitus ([place]) 05/31/2012   Hypercholesteremia 05/31/2012   OAB (overactive bladder)    Malignant neoplasm of corpus uteri, except isthmus ([place]) 12/10/2011    Class: Stage 1    Adelene Polivka, PT 09/11/21 11:44 AM   [facility] [address], [place], [place], [address] Phone: [phone]   Fax:  [phone]  Name: Jenna [name] MRN: [id number] Date of Birth: 12/08/1931  [facility] [address] [place], [place], [address] Phone: [phone]   Fax:  [phone]  Physical Therapy Treatment  Patient Details  Name: Jenna [name] MRN: [id number] Date of Birth: [birth year] Referring Provider (PT): [place]ib[place] [name], MD   Encounter Date: 09/11/2021   PT End of Session - 09/11/21 1141     Visit Number 2    Date for PT Re-Evaluation 10/17/21    Authorization Type Medicare A and B    Authorization Time Period 09/05/21 to 10/17/21    Progress Note Due on Visit 10    PT Start Time 1100    PT Stop Time 1140    PT Time Calculation (min) 40 min    Activity Tolerance Patient tolerated treatment well;No increased pain    Behavior During Therapy WFL for tasks assessed/performed             Past Medical History:  Diagnosis Date   Arthritis    Cancer ([place])    endometrial cancer   Cellulitis of left lower extremity    Diabetes mellitus    Hyperlipemia    Hypertension    Non-healing ulcer of lower leg ([facility])    Left leg     OAB (overactive bladder)    Obesity    BMI 45    Past Surgical History:  Procedure Laterality Date   ABDOMINAL HYSTERECTOMY  02/2008   robotic hyst BSO Bilateral LND   EYE SURGERY     cataract   IR GENERIC HISTORICAL  09/30/2016   IR EMBO VENOUS NOT HEMORR HEMANG  INC GUIDE ROADMAPPING [facility] ULTRASOUND   JOINT REPLACEMENT     knee   KNEE SURGERY     left knee   LAPAROSCOPIC CHOLECYSTECTOMY SINGLE PORT N/A 07/04/2014   Procedure: LAPAROSCOPIC CHOLECYSTECTOMY SINGLE PORT;  Surgeon: Adin Hector, MD;  Location: [facility];  Service: General;  Laterality: N/A;   ROTATOR CUFF REPAIR     right arm   TUBAL LIGATION      There were no vitals filed for this visit.   Subjective Assessment - 09/11/21 1103     Subjective I am doing better - I think the exercises are really helping.  I can lay on my Rt side some now.    Pertinent History HTN, Diabetes, HLD    Diagnostic  tests none recent    Patient Stated Goals decrease pain in the neck/shoulder    Currently in Pain? Yes    Pain Score 6     Pain Location Shoulder    Pain Orientation Right;Posterior    Pain Descriptors / Indicators Aching;Sore    Pain Type Acute pain                               [facility] Adult PT Treatment/Exercise - 09/11/21 0001       Exercises   Exercises Shoulder      Shoulder Exercises: Supine   Horizontal ABduction Strengthening;Both;15 reps;Theraband    Theraband Level (Shoulder Horizontal ABduction) Level 1 (Yellow)    External Rotation Strengthening;Both;15 reps;Theraband    Theraband Level (Shoulder External Rotation) Level 1 (Yellow)    Flexion AAROM;Both;15 reps    Flexion Limitations dowel/cane      Shoulder Exercises: Seated   Row Strengthening;Both;15 reps;Theraband    Theraband Level (Shoulder Row) Level 1 (Yellow)    Row Limitations from chair    Other Seated Exercises shoulder  [facility] [address] [place], [place], [address] Phone: [phone]   Fax:  [phone]  Physical Therapy Treatment  Patient Details  Name: Jenna [name] MRN: [id number] Date of Birth: [birth year] Referring Provider (PT): [place] [name], MD   Encounter Date: 09/11/2021   PT End of Session - 09/11/21 1141     Visit Number 2    Date for PT Re-Evaluation 10/17/21    Authorization Type Medicare A and B    Authorization Time Period 09/05/21 to 10/17/21    Progress Note Due on Visit 10    PT Start Time 1100    PT Stop Time 1140    PT Time Calculation (min) 40 min    Activity Tolerance Patient tolerated treatment well;No increased pain    Behavior During Therapy WFL for tasks assessed/performed             Past Medical History:  Diagnosis Date   Arthritis    Cancer ([place])    endometrial cancer   Cellulitis of left lower extremity    Diabetes mellitus    Hyperlipemia    Hypertension    Non-healing ulcer of lower leg (HCC)    Left leg     OAB (overactive bladder)    Obesity    BMI 45    Past Surgical History:  Procedure Laterality Date   ABDOMINAL HYSTERECTOMY  02/2008   robotic hyst BSO Bilateral LND   EYE SURGERY     cataract   IR GENERIC HISTORICAL  09/30/2016   IR EMBO VENOUS NOT HEMORR HEMANG  INC GUIDE [facility]MAPPING [facility] ULTRASOUND   JOINT REPLACEMENT     knee   KNEE SURGERY     left knee   LAPAROSCOPIC CHOLECYSTECTOMY SINGLE PORT N/A 07/04/2014   Procedure: LAPAROSCOPIC CHOLECYSTECTOMY SINGLE PORT;  Surgeon: Adin Hector, MD;  Location: [facility];  Service: General;  Laterality: N/A;   ROTATOR CUFF REPAIR     right arm   TUBAL LIGATION      There were no vitals filed for this visit.   Subjective Assessment - 09/11/21 1103     Subjective I am doing better - I think the exercises are really helping.  I can lay on my Rt side some now.    Pertinent History HTN, Diabetes, HLD    Diagnostic  tests none recent    Patient Stated Goals decrease pain in the neck/shoulder    Currently in Pain? Yes    Pain Score 6     Pain Location Shoulder    Pain Orientation Right;Posterior    Pain Descriptors / Indicators Aching;Sore    Pain Type Acute pain                               [facility] Adult PT Treatment/Exercise - 09/11/21 0001       Exercises   Exercises Shoulder      Shoulder Exercises: Supine   Horizontal ABduction Strengthening;Both;15 reps;Theraband    Theraband Level (Shoulder Horizontal ABduction) Level 1 (Yellow)    External Rotation Strengthening;Both;15 reps;Theraband    Theraband Level (Shoulder External Rotation) Level 1 (Yellow)    Flexion AAROM;Both;15 reps    Flexion Limitations dowel/cane      Shoulder Exercises: Seated   Row Strengthening;Both;15 reps;Theraband    Theraband Level (Shoulder Row) Level 1 (Yellow)    Row Limitations from chair    Other Seated Exercises shoulder

## 2021-09-11 NOTE — Patient Instructions (Signed)
Access Code: [email or web address] URL: [email or web address] Date: 09/11/2021 Prepared by: Venetia Night Declin Rajan  Exercises Shoulder Rolls in Sitting - 2 x daily - 7 x weekly - 3 sets - 10 reps Seated Shoulder External Rotation with Dumbbells - 2 x daily - 7 x weekly - 2 sets - 10 reps - 2-3 seconds hold Shoulder Flexion Wall Slide with Towel - 1 x daily - 7 x weekly - 1 sets - 10 reps - 5 hold Supine Shoulder Flexion Extension AAROM with Dowel - 1 x daily - 7 x weekly - 1 sets - 10 reps Supine Shoulder External Rotation with Resistance - 1 x daily - 7 x weekly - 2 sets - 10 reps Supine Shoulder Horizontal Abduction with Resistance - 1 x daily - 7 x weekly - 2 sets - 10 reps Seated Shoulder Row with Anchored Resistance - 1 x daily - 7 x weekly - 2 sets - 10 reps

## 2021-09-13 ENCOUNTER — Other Ambulatory Visit: Payer: Self-pay

## 2021-09-13 ENCOUNTER — Encounter: Payer: Medicare Other | Admitting: Physician Assistant

## 2021-09-13 DIAGNOSIS — E11622 Type 2 diabetes mellitus with other skin ulcer: Secondary | ICD-10-CM | POA: Diagnosis not present

## 2021-09-13 DIAGNOSIS — I890 Lymphedema, not elsewhere classified: Secondary | ICD-10-CM | POA: Diagnosis not present

## 2021-09-13 DIAGNOSIS — I872 Venous insufficiency (chronic) (peripheral): Secondary | ICD-10-CM | POA: Diagnosis not present

## 2021-09-13 DIAGNOSIS — L97822 Non-pressure chronic ulcer of other part of left lower leg with fat layer exposed: Secondary | ICD-10-CM | POA: Diagnosis not present

## 2021-09-13 DIAGNOSIS — L97821 Non-pressure chronic ulcer of other part of left lower leg limited to breakdown of skin: Secondary | ICD-10-CM | POA: Diagnosis not present

## 2021-09-13 DIAGNOSIS — M1990 Unspecified osteoarthritis, unspecified site: Secondary | ICD-10-CM | POA: Diagnosis not present

## 2021-09-13 DIAGNOSIS — I10 Essential (primary) hypertension: Secondary | ICD-10-CM | POA: Diagnosis not present

## 2021-09-13 NOTE — Progress Notes (Addendum)
Jenna [name], Jenna [name] ([id number]) Visit Report for 09/13/2021 Chief Complaint Document Details Patient Name: Jenna [name], Jenna [name]. Date of Service: 09/13/2021 10:00 AM Medical Record Number: [id number] Patient Account Number: [id number] Date of Birth/Sex: 03/19/1931 (87 y.o. F) Treating RN: Jenna [name] Primary Care Provider: Lujean [name] Other Clinician: Referring Provider: Dorthy [name], Jenna [name] Treating Provider/Extender: Jenna [name] in Treatment: 3 Information Obtained from: Patient Chief Complaint Left LE Ulcer Electronic Signature(s) Signed: 09/13/2021 10:35:13 AM By: Worthy Keeler PA-C Entered By: Worthy [name] on 09/13/2021 10:35:13 Jenna [name] ([id number]) -------------------------------------------------------------------------------- HPI Details Patient Name: Jenna [name] Date of Service: 09/13/2021 10:00 AM Medical Record Number: [id number] Patient Account Number: [id number] Date of Birth/Sex: [birth year] (87 y.o. F) Treating RN: Jenna [name] Primary Care Provider: Lujean [name] Other Clinician: Referring Provider: Dorthy [name], Jenna [name] Treating Provider/Extender: Jenna [name] in Treatment: 3 History of Present Illness Location: left lower extremity in the medial ankle region Quality: Patient reports experiencing a dull pain to affected area(s). Severity: Patient states wound are getting worse. Duration: Patient has had the wound for > 3 months prior to seeking treatment at the wound center Timing: Pain in wound is Intermittent (comes and goes Context: The wound appeared gradually over time Modifying Factors: Other treatment(s) tried include:Jenna [name] admitted to hospital for IV antibiotics for cellulitis Associated Signs and Symptoms: Patient reports having increase swelling. HPI Description: 87 year old patient with a past medical history significant for venous stasis ulceration and diabetes mellitus was recently admitted to the hospital between 07/07/2016 and  07/09/2016. She was wound to have a nonpurulent cellulitis of the left lower extremity and was started on IV antibiotics which included vancomycin. He is discharged home with her and Unna's boots and oral doxycycline. During this admission there was no obvious DVT involving the left lower extremity. her past medical history significant for diabetes mellitus, hypertension, hyperlipidemia, varicose veins, status post right rotator cuff repair, knee surgery on the left,robotic abdominal hysterectomy, laparoscopic cholecystectomy. She is not a smoker. Venous study done on 07/18/2015 showed normal left extremity deep venous system with no evidence of DVT. There was extensive valvular incompetence and reflux throughout the left greater saphenous vein with direct the medication to subcutaneous varicose veins. Normal left small saphenous vein. I understand she was seen by vascular radiology group and the workup and recommendation was for endovenous ablation but due to family pressure she was not able to keep her appointment a year ago. She has not been wearing her compression stockings regularly. 05/26/18 READMISSION this is an 87 year old woman who was previously seen in this clinic in 2017 by Dr. Con [name]. She has chronic venous insufficiency with lymphedema and at that time had open area on the left medial calf and ankle area. Was recommended that she wear 20-30 mm compression stockings and the patient tells me that she has been compliant with this although I think her primary doctor recently told her that she didn't need to wear stockings out of fear it might cause arterial compression. She noticed edema and pain in the area a week to 2 ago. She saw her primary doctor and was given doxycycline and Silvadene cream to put on the area. She states things are a lot better. The patient has a history of type 2 diabetes on oral agents but she is unaware of her hemoglobin A1c for her blood glucose which she  doesn't check. She has hypertension, varicose veins, rotator cuff repair, knee surgery on the left, history of a laparoscopic cholecystectomy, lymphedema, obstructive sleep apnea, history  of endometrial CA. The patient has been to see vein and vascular in the past and I think has had an ablation of the left greater saphenous vein based on ultrasounds of the left leg IC dating back to 2017. Her most recent ultrasound was in May 2018 which showed no evidence of a DVT and continued durable closure of the treated segment of the left greater saphenous vein. Patent lateral accessory branch of the greater saphenous vein extending to the calf ABIs in our clinic were 1.05 on the right and 0.95 on the left 06/02/18 the patient's wound on the left medial lower calf is completely closed and epithelialized. She has new 20-30 mm below-knee stockings READMISSION 11/02/2019 This is a now 87 year old woman who has been in this clinic 2 times before. Most recently in 2019. She has chronic venous insufficiency with some degree of secondary lymphedema. She has had a history of a left greater saphenous vein ablation. When she is here in 2019 she had a wound on the left medial lower calf. This closed fairly easily. It was recommended she wear 20/30 mm below-knee stockings she is not compliant with this. She arrives in clinic with a 2-week history of a left medial lower extremity and ankle wound. Some of this has dry slough on it but most of it is already epithelialize she has been using a combination of Vaseline and/or Silvadene. She is not wearing any compression. She has a history of chronic venous reflux. There was recommendations in the past for ablations I do not know that she ever carried through with this. According to notes from 2016 this work-up was done via the interventional radiology group. We will need to research this. She is probably going to need a consultation ABIs in our clinic were 1.03 on the right  and 0.93 on the left 11/11; patient's wound looks as though it is epithelializing horizontal wound on the left medial lower extremity. She has a superior satellite lesion. She is complaining of a lot of pain in 3 layer compression. There have been recommendations for previous ablations I am not sure if she followed up on this. She has been noncompliant with stockings although she brought those into the clinic today. 11/18; the patient had a repeat venous reflux studies at vein and vascular. This did not show any DVT in the left lower extremity there was no evidence of chronic venous insufficiency and no evidence of superficial vein thrombosis. I looked back at previous studies done I think in interventional radiology in 2018 would suggest that there had been previous ablation of a segment of the left greater saphenous vein. By review of the new study I do not think the patient needs to see vein and vascular however I find these studies increasingly difficult to interpret. I am not sure that the patient has actually consistently worn compression stockings and I think that is the next step here. The wound is just about closed Jenna [name], Jenna [name] ([id number]) 11/22/2019 upon evaluation today patient actually appears to be healed based on what I am seeing today. She has been tolerating the dressing changes without complication. Fortunately there is no signs of active infection at this time. Overall I feel like even though this is the first time of seeing her that she has actually done extremely well with the current wound care measures again she has been under the care of Dr. Dellia Nims. 12/2 the patient was expected to be healed this week however she comes in with 3  small wounds that look much the same as the pictures from 2 weeks ago. These are all in the area of the left medial lower extremity. She was put into her own compression stockings last week I do not think the edema control was adequate in this area  for healing. We have been using Hydrofera Blue 12/9; wound is fully epithelialized but still looks vulnerable on the left medial lower extremity. Surrounding venous inflammation. I think she has lymphedema with fibrosed skin in the distal lower leg. [Inverted bottle sign]. She has had venous reflux studies that have not shown any of the superficial veins to be amenable to ablations. This is been repeated during this visit. I am not really convinced that she has been wearing compression stockings although she certainly has them. 12/23; patient's wound on the left medial lower extremity is totally healed. She has surrounding venous inflammation and skin damage related to there is also some degree of lymphedema and fibrosed skin in the distal lower extremity. She has her compression stocking Readmission: 03/25/2021 upon evaluation today patient appears to be doing excellent in regard to her wound all things considered. She does appear to have gotten very quickly this time which is great news compared to before when she tells me she let the wound get somewhat out of control. Nonetheless right now she tells me this has been present for about 3 weeks she has been using Vaseline on it. She does have a history of diabetes though she has not taken Metformin for years she sees her primary care provider on Friday and she will see what he says but she is hoping he would not put her back on this. Subsequently she also does have Lasix that she is not been taking it recently. She has a past medical history significant for venous stasis, lymphedema, diabetes mellitus type 2, and hypertension. 04/01/2021 on evaluation today patient appears to be doing well with regard to her wound. There is no signs of active infection at this time. No fevers, chills, nausea, vomiting, or diarrhea. 04/08/2021 upon evaluation today patient appears to be doing excellent in regard to her wounds currently. She has been tolerating the  dressing changes and in fact appears to be completely healed which is great news. Readmission: 08/22/2021 patient presents today for reevaluation here in the clinic concerning a reopening of the wound on the left medial ankle/lower leg region which is the same area I took care of earlier in the year between March and April. Fortunately there does not appear to be anything to do a peer which is good news. I do think that as before this can probably heal fatherly rapidly. Nonetheless I do believe that based on what we are seeing she probably needs a compression wrap at this time. Her medical history really is not changed. Her ABI back in March of this year was 1.28 on this left leg and was doing well. 08/29/2021 upon evaluation today patient appears to be doing well. With regard to her wound she is actually showing signs of good granulation. I am very pleased in that regard. There does not appear to be any evidence of active infection at this time. 09/06/2021 upon evaluation today patient's wound actually showing signs of doing very well as far as granulation epithelization is concerned. Fortunately I do not see any need for sharp debridement today and very pleased in that regard. There does seem to be some issue here with still continued drainage but nonetheless I think that  the compression wrap is doing a very good job in helping to mitigate this as well. Overall I think that we are headed in an appropriate direction towards getting this completely closed. 09/13/2021 upon evaluation today patient appears to be doing better in regard to the overall measurement today. In fact the measurement really does not speak to as much as this is healed due to the fact that some of the areas that are open are somewhat scattered. In general I am extremely pleased with where we stand today with healing. The patient likewise is also extremely happy with what she is seeing and hopefully will get this closed pretty  soon. Electronic Signature(s) Signed: 09/13/2021 5:15:46 PM By: Worthy Keeler PA-C Entered By: Worthy [name] on 09/13/2021 17:15:46 Jenna [name], Jenna [name] ([id number]) -------------------------------------------------------------------------------- Physical Exam Details Patient Name: Jenna [name] Date of Service: 09/13/2021 10:00 AM Medical Record Number: [id number] Patient Account Number: [id number] Date of Birth/Sex: [birth year] (87 y.o. F) Treating RN: Jenna [name] Primary Care Provider: Lujean [name] Other Clinician: Referring Provider: Dorthy [name], Jenna [name] Treating Provider/Extender: Jenna [name] in Treatment: 3 Constitutional Well-nourished and well-hydrated in no acute distress. Respiratory normal breathing without difficulty. Psychiatric this patient is able to make decisions and demonstrates good insight into disease process. Alert and Oriented x 3. pleasant and cooperative. Notes Upon inspection patient's wound bed actually showed signs of fairly good granulation epithelization at this point. Fortunately there does not appear to be any signs of active infection systemically which is great news and overall I am extremely pleased with where we stand today. Electronic Signature(s) Signed: 09/13/2021 5:16:36 PM By: Worthy Keeler PA-C Entered By: Worthy [name] on 09/13/2021 17:16:36 Wakeley, Jenna [name] ([id number]) -------------------------------------------------------------------------------- Physician Orders Details Patient Name: Jenna [name] Date of Service: 09/13/2021 10:00 AM Medical Record Number: [id number] Patient Account Number: [id number] Date of Birth/Sex: Jan 19, 1931 (87 y.o. F) Treating RN: Jenna [name] Primary Care Provider: Dorthy [name], Jenna [name] Other Clinician: Referring Provider: Dorthy [name], Jenna [name] Treating Provider/Extender: Jenna [name] in Treatment: 3 Verbal / Phone Orders: No Diagnosis Coding ICD-10 Coding Code Description I87.2 Venous  insufficiency (chronic) (peripheral) I89.0 Lymphedema, not elsewhere classified L97.821 Non-pressure chronic ulcer of other part of left lower leg limited to breakdown of skin E11.622 Type 2 diabetes mellitus with other skin ulcer I10 Essential (primary) hypertension Follow-up Appointments o Return Appointment in 1 week. o Nurse Visit as needed Bathing/ Shower/ Hygiene o May shower with wound dressing protected with water repellent cover or cast protector. Edema Control - Lymphedema / Segmental Compressive Device / Other Left Lower Extremity o Optional: One layer of unna paste to top of compression wrap (to act as an anchor). o Elevate, Exercise Daily and Avoid Standing for Long Periods of Time. o Elevate legs to the level of the heart and pump ankles as often as possible o Elevate leg(s) parallel to the floor when sitting. Additional Orders / Instructions o Follow Nutritious Diet and Increase Protein Intake o Activity as tolerated Wound Treatment Wound #5 - Lower Leg Wound Laterality: Left, Medial, Distal Cleanser: Soap and Water 1 x Per Week/30 Days Discharge Instructions: Gently cleanse wound with antibacterial soap, rinse and pat dry prior to dressing wounds Peri-Wound Care: Moisturizing Lotion 1 x Per Week/30 Days Discharge Instructions: Suggestions: Theraderm, Eucerin, Cetaphil, or patient preference. Primary Dressing: Xeroform-HBD 2x2 (in/in) 1 x Per Week/30 Days Discharge Instructions: Apply Xeroform-HBD 2x2 (in/in) as directed Secondary Dressing: Gauze 1 x Per Week/30 Days Discharge Instructions: Apply dry gauze as secondary Compression Wrap:  Profore Lite LF 3 Multilayer Compression Bandaging System 1 x Per Week/30 Days Discharge Instructions: Apply 3 multi-layer wrap as prescribed. Electronic Signature(s) Signed: 09/13/2021 4:40:04 PM By: Jenna Amen RN Signed: 09/13/2021 5:37:36 PM By: Worthy Keeler PA-C Entered By: Jenna [name] on 09/13/2021  10:37:42 Jakailah, Lorincz Jenna [name] ([id number]) -------------------------------------------------------------------------------- Problem List Details Patient Name: AROURA, APPLEYARD. Date of Service: 09/13/2021 10:00 AM Medical Record Number: [id number] Patient Account Number: [id number] Date of Birth/Sex: November 01, 1931 (87 y.o. F) Treating RN: Jenna [name] Primary Care Provider: Dorthy [name], Jenna [name] Other Clinician: Referring Provider: Dorthy [name], Jenna [name] Treating Provider/Extender: Jenna [name] in Treatment: 3 Active Problems ICD-10 Encounter Code Description Active Date MDM Diagnosis I87.2 Venous insufficiency (chronic) (peripheral) 08/22/2021 No Yes I89.0 Lymphedema, not elsewhere classified 08/22/2021 No Yes L97.821 Non-pressure chronic ulcer of other part of left lower leg limited to 08/22/2021 No Yes breakdown of skin E11.622 Type 2 diabetes mellitus with other skin ulcer 08/22/2021 No Yes I10 Essential (primary) hypertension 08/22/2021 No Yes Inactive Problems Resolved Problems Electronic Signature(s) Signed: 09/13/2021 10:35:05 AM By: Worthy Keeler PA-C Entered By: Worthy [name] on 09/13/2021 10:35:05 Jenna [name] ([id number]) -------------------------------------------------------------------------------- Progress Note Details Patient Name: Jenna [name] Date of Service: 09/13/2021 10:00 AM Medical Record Number: [id number] Patient Account Number: [id number] Date of Birth/Sex: 03-08-1931 (87 y.o. F) Treating RN: Jenna [name] Primary Care Provider: Lujean [name] Other Clinician: Referring Provider: Dorthy [name], Jenna [name] Treating Provider/Extender: Jenna [name] in Treatment: 3 Subjective Chief Complaint Information obtained from Patient Left LE Ulcer History of Present Illness (HPI) The following HPI elements were documented for the patient's wound: Location: left lower extremity in the medial ankle region Quality: Patient reports experiencing a dull pain to affected  area(s). Severity: Patient states wound are getting worse. Duration: Patient has had the wound for > 3 months prior to seeking treatment at the wound center Timing: Pain in wound is Intermittent (comes and goes Context: The wound appeared gradually over time Modifying Factors: Other treatment(s) tried include:Jenna [name] admitted to hospital for IV antibiotics for cellulitis Associated Signs and Symptoms: Patient reports having increase swelling. 87 year old patient with a past medical history significant for venous stasis ulceration and diabetes mellitus was recently admitted to the hospital between 07/07/2016 and 07/09/2016. She was wound to have a nonpurulent cellulitis of the left lower extremity and was started on IV antibiotics which included vancomycin. He is discharged home with her and Unna's boots and oral doxycycline. During this admission there was no obvious DVT involving the left lower extremity. her past medical history significant for diabetes mellitus, hypertension, hyperlipidemia, varicose veins, status post right rotator cuff repair, knee surgery on the left,robotic abdominal hysterectomy, laparoscopic cholecystectomy. She is not a smoker. Venous study done on 07/18/2015 showed normal left extremity deep venous system with no evidence of DVT. There was extensive valvular incompetence and reflux throughout the left greater saphenous vein with direct the medication to subcutaneous varicose veins. Normal left small saphenous vein. I understand she was seen by vascular radiology group and the workup and recommendation was for endovenous ablation but due to family pressure she was not able to keep her appointment a year ago. She has not been wearing her compression stockings regularly. 05/26/18 READMISSION this is an 87 year old woman who was previously seen in this clinic in 2017 by Dr. Con [name]. She has chronic venous insufficiency with lymphedema and at that time had open area on the  left medial calf and ankle area. Was recommended that she wear 20-30 mm compression  stockings and the patient tells me that she has been compliant with this although I think her primary doctor recently told her that she didn't need to wear stockings out of fear it might cause arterial compression. She noticed edema and pain in the area a week to 2 ago. She saw her primary doctor and was given doxycycline and Silvadene cream to put on the area. She states things are a lot better. The patient has a history of type 2 diabetes on oral agents but she is unaware of her hemoglobin A1c for her blood glucose which she doesn't check. She has hypertension, varicose veins, rotator cuff repair, knee surgery on the left, history of a laparoscopic cholecystectomy, lymphedema, obstructive sleep apnea, history of endometrial CA. The patient has been to see vein and vascular in the past and I think has had an ablation of the left greater saphenous vein based on ultrasounds of the left leg IC dating back to 2017. Her most recent ultrasound was in May 2018 which showed no evidence of a DVT and continued durable closure of the treated segment of the left greater saphenous vein. Patent lateral accessory branch of the greater saphenous vein extending to the calf ABIs in our clinic were 1.05 on the right and 0.95 on the left 06/02/18 the patient's wound on the left medial lower calf is completely closed and epithelialized. She has new 20-30 mm below-knee stockings READMISSION 11/02/2019 This is a now 87 year old woman who has been in this clinic 2 times before. Most recently in 2019. She has chronic venous insufficiency with some degree of secondary lymphedema. She has had a history of a left greater saphenous vein ablation. When she is here in 2019 she had a wound on the left medial lower calf. This closed fairly easily. It was recommended she wear 20/30 mm below-knee stockings she is not compliant with this. She arrives  in clinic with a 2-week history of a left medial lower extremity and ankle wound. Some of this has dry slough on it but most of it is already epithelialize she has been using a combination of Vaseline and/or Silvadene. She is not wearing any compression. She has a history of chronic venous reflux. There was recommendations in the past for ablations I do not know that she ever carried through with this. According to notes from 2016 this work-up was done via the interventional radiology group. We will need to research this. She is probably going to need a consultation ABIs in our clinic were 1.03 on the right and 0.93 on the left 11/11; patient's wound looks as though it is epithelializing horizontal wound on the left medial lower extremity. She has a superior satellite lesion. Jenna [name], Jenna [name] ([id number]) She is complaining of a lot of pain in 3 layer compression. There have been recommendations for previous ablations I am not sure if she followed up on this. She has been noncompliant with stockings although she brought those into the clinic today. 11/18; the patient had a repeat venous reflux studies at vein and vascular. This did not show any DVT in the left lower extremity there was no evidence of chronic venous insufficiency and no evidence of superficial vein thrombosis. I looked back at previous studies done I think in interventional radiology in 2018 would suggest that there had been previous ablation of a segment of the left greater saphenous vein. By review of the new study I do not think the patient needs to see vein and vascular however I  find these studies increasingly difficult to interpret. I am not sure that the patient has actually consistently worn compression stockings and I think that is the next step here. The wound is just about closed 11/22/2019 upon evaluation today patient actually appears to be healed based on what I am seeing today. She has been tolerating the  dressing changes without complication. Fortunately there is no signs of active infection at this time. Overall I feel like even though this is the first time of seeing her that she has actually done extremely well with the current wound care measures again she has been under the care of Dr. Dellia Nims. 12/2 the patient was expected to be healed this week however she comes in with 3 small wounds that look much the same as the pictures from 2 weeks ago. These are all in the area of the left medial lower extremity. She was put into her own compression stockings last week I do not think the edema control was adequate in this area for healing. We have been using Hydrofera Blue 12/9; wound is fully epithelialized but still looks vulnerable on the left medial lower extremity. Surrounding venous inflammation. I think she has lymphedema with fibrosed skin in the distal lower leg. [Inverted bottle sign]. She has had venous reflux studies that have not shown any of the superficial veins to be amenable to ablations. This is been repeated during this visit. I am not really convinced that she has been wearing compression stockings although she certainly has them. 12/23; patient's wound on the left medial lower extremity is totally healed. She has surrounding venous inflammation and skin damage related to there is also some degree of lymphedema and fibrosed skin in the distal lower extremity. She has her compression stocking Readmission: 03/25/2021 upon evaluation today patient appears to be doing excellent in regard to her wound all things considered. She does appear to have gotten very quickly this time which is great news compared to before when she tells me she let the wound get somewhat out of control. Nonetheless right now she tells me this has been present for about 3 weeks she has been using Vaseline on it. She does have a history of diabetes though she has not taken Metformin for years she sees her primary care  provider on Friday and she will see what he says but she is hoping he would not put her back on this. Subsequently she also does have Lasix that she is not been taking it recently. She has a past medical history significant for venous stasis, lymphedema, diabetes mellitus type 2, and hypertension. 04/01/2021 on evaluation today patient appears to be doing well with regard to her wound. There is no signs of active infection at this time. No fevers, chills, nausea, vomiting, or diarrhea. 04/08/2021 upon evaluation today patient appears to be doing excellent in regard to her wounds currently. She has been tolerating the dressing changes and in fact appears to be completely healed which is great news. Readmission: 08/22/2021 patient presents today for reevaluation here in the clinic concerning a reopening of the wound on the left medial ankle/lower leg region which is the same area I took care of earlier in the year between March and April. Fortunately there does not appear to be anything to do a peer which is good news. I do think that as before this can probably heal fatherly rapidly. Nonetheless I do believe that based on what we are seeing she probably needs a compression wrap at  this time. Her medical history really is not changed. Her ABI back in March of this year was 1.28 on this left leg and was doing well. 08/29/2021 upon evaluation today patient appears to be doing well. With regard to her wound she is actually showing signs of good granulation. I am very pleased in that regard. There does not appear to be any evidence of active infection at this time. 09/06/2021 upon evaluation today patient's wound actually showing signs of doing very well as far as granulation epithelization is concerned. Fortunately I do not see any need for sharp debridement today and very pleased in that regard. There does seem to be some issue here with still continued drainage but nonetheless I think that the compression wrap  is doing a very good job in helping to mitigate this as well. Overall I think that we are headed in an appropriate direction towards getting this completely closed. 09/13/2021 upon evaluation today patient appears to be doing better in regard to the overall measurement today. In fact the measurement really does not speak to as much as this is healed due to the fact that some of the areas that are open are somewhat scattered. In general I am extremely pleased with where we stand today with healing. The patient likewise is also extremely happy with what she is seeing and hopefully will get this closed pretty soon. Objective Constitutional Well-nourished and well-hydrated in no acute distress. Vitals Time Taken: 10:08 AM, Height: 60 in, Weight: 212 lbs, BMI: 41.4, Temperature: 98.2 F, Pulse: 75 bpm, Respiratory Rate: 16 breaths/min, Blood Pressure: 135/78 mmHg. Respiratory normal breathing without difficulty. Psychiatric Jenna [name], Jenna [name] ([id number]) this patient is able to make decisions and demonstrates good insight into disease process. Alert and Oriented x 3. pleasant and cooperative. General Notes: Upon inspection patient's wound bed actually showed signs of fairly good granulation epithelization at this point. Fortunately there does not appear to be any signs of active infection systemically which is great news and overall I am extremely pleased with where we stand today. Integumentary (Hair, Skin) Wound #5 status is Open. Original cause of wound was Gradually Appeared. The date acquired was: 08/15/2021. The wound has been in treatment 3 weeks. The wound is located on the Left,Distal,Medial Lower Leg. The wound measures 1cm length x 2cm width x 0.1cm depth; 1.571cm^2 area and 0.157cm^3 volume. There is Fat Layer (Subcutaneous Tissue) exposed. There is no tunneling or undermining noted. There is a medium amount of serosanguineous drainage noted. The wound margin is flat and intact. There is  large (67-100%) red, pink granulation within the wound bed. There is no necrotic tissue within the wound bed. Assessment Active Problems ICD-10 Venous insufficiency (chronic) (peripheral) Lymphedema, not elsewhere classified Non-pressure chronic ulcer of other part of left lower leg limited to breakdown of skin Type 2 diabetes mellitus with other skin ulcer Essential (primary) hypertension Procedures Wound #5 Pre-procedure diagnosis of Wound #5 is a Venous Leg Ulcer located on the Left,Distal,Medial Lower Leg . There was a Three Layer Compression Therapy Procedure with a pre-treatment ABI of 1.3 by Jenna Amen, RN. Post procedure Diagnosis Wound #5: Same as Pre-Procedure Plan Follow-up Appointments: Return Appointment in 1 week. Nurse Visit as needed Bathing/ Shower/ Hygiene: May shower with wound dressing protected with water repellent cover or cast protector. Edema Control - Lymphedema / Segmental Compressive Device / Other: Optional: One layer of unna paste to top of compression wrap (to act as an anchor). Elevate, Exercise Daily and Avoid Standing  for Long Periods of Time. Elevate legs to the level of the heart and pump ankles as often as possible Elevate leg(s) parallel to the floor when sitting. Additional Orders / Instructions: Follow Nutritious Diet and Increase Protein Intake Activity as tolerated WOUND #5: - Lower Leg Wound Laterality: Left, Medial, Distal Cleanser: Soap and Water 1 x Per Week/30 Days Discharge Instructions: Gently cleanse wound with antibacterial soap, rinse and pat dry prior to dressing wounds Peri-Wound Care: Moisturizing Lotion 1 x Per Week/30 Days Discharge Instructions: Suggestions: Theraderm, Eucerin, Cetaphil, or patient preference. Primary Dressing: Xeroform-HBD 2x2 (in/in) 1 x Per Week/30 Days Discharge Instructions: Apply Xeroform-HBD 2x2 (in/in) as directed Secondary Dressing: Gauze 1 x Per Week/30 Days Discharge Instructions: Apply dry  gauze as secondary Compression Wrap: Profore Lite LF 3 Multilayer Compression Bandaging System 1 x Per Week/30 Days Discharge Instructions: Apply 3 multi-layer wrap as prescribed. Jenna [name], Jenna [name] ([id number]) 1. Would recommend currently that we actually go ahead and continue with the wound care measures as before specifically with regard to the Xeroform gauze which I think is doing a great job. 2. I am going to recommend that we continue with the 3 layer compression wrap which I think is doing awesome job as well. 3. I would also suggest that we have the patient continue to monitor for any signs of worsening but I am actually hopeful that this will be completely sealed up and healed shortly. We will see patient back for reevaluation in 1 week here in the clinic. If anything worsens or changes patient will contact our office for additional recommendations. Electronic Signature(s) Signed: 09/13/2021 5:17:28 PM By: Worthy Keeler PA-C Entered By: Worthy [name] on 09/13/2021 17:17:28 Jacobsen, Jenna [name] ([id number]) -------------------------------------------------------------------------------- SuperBill Details Patient Name: Jenna [name] Date of Service: 09/13/2021 Medical Record Number: [id number] Patient Account Number: [id number] Date of Birth/Sex: November 21, 1931 (87 y.o. F) Treating RN: Jenna [name] Primary Care Provider: Dorthy [name], Jenna [name] Other Clinician: Referring Provider: Dorthy [name], Jenna [name] Treating Provider/Extender: Jenna [name] in Treatment: 3 Diagnosis Coding ICD-10 Codes Code Description I87.2 Venous insufficiency (chronic) (peripheral) I89.0 Lymphedema, not elsewhere classified L97.821 Non-pressure chronic ulcer of other part of left lower leg limited to breakdown of skin E11.622 Type 2 diabetes mellitus with other skin ulcer I10 Essential (primary) hypertension Facility Procedures CPT4 Code: [id number] Description: (Facility Use Only) [phone] - [facility]  LWR LT LEG Modifier: Quantity: 1 Physician Procedures CPT4 [id number] Description: [id number] - WC PHYS LEVEL 3 - EST PT Modifier: Quantity: 1 CPT4 Code: Description: ICD-10 Diagnosis Description I87.2 Venous insufficiency (chronic) (peripheral) I89.0 Lymphedema, not elsewhere classified L97.821 Non-pressure chronic ulcer of other part of left lower leg limited to bre E11.622 Type 2 diabetes mellitus with  other skin ulcer Modifier: akdown of skin Quantity: Electronic Signature(s) Signed: 09/13/2021 5:17:51 PM By: Worthy Keeler PA-C Previous Signature: 09/13/2021 4:40:04 PM Version By: Jenna Amen RN Entered By: Worthy [name] on 09/13/2021 17:17:51

## 2021-09-13 NOTE — Progress Notes (Signed)
CRISTINE, MARTINCIC ([id number]) Visit Report for 09/13/2021 Arrival Information Details Patient Name: Jenna [name], Jenna [name]. Date of Service: 09/13/2021 10:00 AM Medical Record Number: [id number] Patient Account Number: [id number] Date of Birth/Sex: 05/16/1931 (87 y.o. F) Treating RN: Rogers Blocker Primary Care Hanley Woerner: Docia Chuck, Dibas Other Clinician: Referring Sreenidhi Ganson: Docia Chuck, Dibas Treating Achol Azpeitia/Extender: Rowan Blase in Treatment: 3 Visit Information History Since Last Visit Pain Present Now: No Patient Arrived: Cane Arrival Time: 10:07 Accompanied By: self Transfer Assistance: None Patient Identification Verified: Yes Secondary Verification Process Completed: Yes Patient Requires Transmission-Based No Precautions: Patient Has Alerts: Yes Patient Alerts: Patient on Blood Thinner aspirin 325 Type II Diabetic ABI 03/25/2021 L)1.28 Electronic Signature(s) Signed: 09/13/2021 4:40:04 PM By: Rogers Blocker RN Entered By: Rogers Blocker on 09/13/2021 10:07:53 Jenna [name] ([id number]) -------------------------------------------------------------------------------- Clinic Level of Care Assessment Details Patient Name: Jenna [name] Date of Service: 09/13/2021 10:00 AM Medical Record Number: [id number] Patient Account Number: [id number] Date of Birth/Sex: 04/26/1931 (87 y.o. F) Treating RN: Rogers Blocker Primary Care Hanan Moen: Docia Chuck, Dibas Other Clinician: Referring Pasqualino Witherspoon: Docia Chuck, Dibas Treating Raylan Troiani/Extender: Rowan Blase in Treatment: 3 Clinic Level of Care Assessment Items TOOL 1 Quantity Score []  - Use when EandM and Procedure is performed on INITIAL visit 0 ASSESSMENTS - Nursing Assessment / Reassessment []  - General Physical Exam (combine w/ comprehensive assessment (listed just below) when performed on new 0 pt. evals) []  - 0 Comprehensive Assessment (HX, ROS, Risk Assessments, Wounds Hx, etc.) ASSESSMENTS - Wound and Skin Assessment /  Reassessment []  - Dermatologic / Skin Assessment (not related to wound area) 0 ASSESSMENTS - Ostomy and/or Continence Assessment and Care []  - Incontinence Assessment and Management 0 []  - 0 Ostomy Care Assessment and Management (repouching, etc.) PROCESS - Coordination of Care []  - Simple Patient / Family Education for ongoing care 0 []  - 0 Complex (extensive) Patient / Family Education for ongoing care []  - 0 Staff obtains Chiropractor, Records, Test Results / Process Orders []  - 0 Staff telephones HHA, Nursing Homes / Clarify orders / etc []  - 0 Routine Transfer to another Facility (non-emergent condition) []  - 0 Routine Hospital Admission (non-emergent condition) []  - 0 New Admissions / Manufacturing engineer / Ordering NPWT, Apligraf, etc. []  - 0 Emergency Hospital Admission (emergent condition) PROCESS - Special Needs []  - Pediatric / Minor Patient Management 0 []  - 0 Isolation Patient Management []  - 0 Hearing / Language / Visual special needs []  - 0 Assessment of Community assistance (transportation, D/C planning, etc.) []  - 0 Additional assistance / Altered mentation []  - 0 Support Surface(s) Assessment (bed, cushion, seat, etc.) INTERVENTIONS - Miscellaneous []  - External ear exam 0 []  - 0 Patient Transfer (multiple staff / Nurse, adult / Similar devices) []  - 0 Simple Staple / Suture removal (25 or less) []  - 0 Complex Staple / Suture removal (26 or more) []  - 0 Hypo/Hyperglycemic Management (do not check if billed separately) []  - 0 Ankle / Brachial Index (ABI) - do not check if billed separately Has the patient been seen at the hospital within the last three years: Yes Total Score: 0 Level Of Care: ____ Jenna [name] ([id number]) Electronic Signature(s) Signed: 09/13/2021 4:40:04 PM By: Rogers Blocker RN Entered By: Rogers Blocker on 09/13/2021 10:37:47 Jenna [name]  ([id number]) -------------------------------------------------------------------------------- Compression Therapy Details Patient Name: Jenna [name] Date of Service: 09/13/2021 10:00 AM Medical Record Number: [id number] Patient Account Number: [id number] Date of Birth/Sex: 08/16/1931 (87 y.o. F) Treating RN: Mordecai Maes,  Dondra Prader Primary Care Hurman Ketelsen: Docia Chuck, Dibas Other Clinician: Referring Zia Najera: Docia Chuck, Dibas Treating Korena Nass/Extender: Rowan Blase in Treatment: 3 Compression Therapy Performed for Wound Assessment: Wound #5 Left,Distal,Medial Lower Leg Performed By: Clinician Rogers Blocker, RN Compression Type: Three Layer Pre Treatment ABI: 1.3 Post Procedure Diagnosis Same as Pre-procedure Electronic Signature(s) Signed: 09/13/2021 4:40:04 PM By: Rogers Blocker RN Entered By: Rogers Blocker on 09/13/2021 10:37:22 Jenna [name] ([id number]) -------------------------------------------------------------------------------- Encounter Discharge Information Details Patient Name: Jenna [name] Date of Service: 09/13/2021 10:00 AM Medical Record Number: [id number] Patient Account Number: [id number] Date of Birth/Sex: 1931-12-29 (87 y.o. F) Treating RN: Rogers Blocker Primary Care Devansh Riese: Docia Chuck, Dibas Other Clinician: Referring Zyen Triggs: Docia Chuck, Dibas Treating Canisha Issac/Extender: Rowan Blase in Treatment: 3 Encounter Discharge Information Items Discharge Condition: Stable Ambulatory Status: Cane Discharge Destination: Home Transportation: Private Auto Accompanied By: self Schedule Follow-up Appointment: Yes Clinical Summary of Care: Electronic Signature(s) Signed: 09/13/2021 4:40:04 PM By: Rogers Blocker RN Entered By: Rogers Blocker on 09/13/2021 10:38:46 Jenna [name] ([id number]) -------------------------------------------------------------------------------- Lower Extremity Assessment Details Patient Name: Jenna [name] Date of  Service: 09/13/2021 10:00 AM Medical Record Number: [id number] Patient Account Number: [id number] Date of Birth/Sex: December 15, 1931 (87 y.o. F) Treating RN: Rogers Blocker Primary Care Harshan Kearley: Docia Chuck, Dibas Other Clinician: Referring Aqib Lough: Docia Chuck, Dibas Treating Aalyssa Elderkin/Extender: Allen Derry Weeks in Treatment: 3 Edema Assessment Assessed: [Left: Yes] [Right: No] Edema: [Left: Ye] [Right: s] Calf Left: Right: Point of Measurement: 29 cm From Medial Instep 42 cm Ankle Left: Right: Point of Measurement: 10 cm From Medial Instep 22.5 cm Vascular Assessment Pulses: Dorsalis Pedis Palpable: [Left:Yes] Electronic Signature(s) Signed: 09/13/2021 4:40:04 PM By: Rogers Blocker RN Entered By: Rogers Blocker on 09/13/2021 10:19:58 Jenna [name] ([id number]) -------------------------------------------------------------------------------- Multi Wound Chart Details Patient Name: Jenna [name] Date of Service: 09/13/2021 10:00 AM Medical Record Number: [id number] Patient Account Number: [id number] Date of Birth/Sex: 02/17/1931 (87 y.o. F) Treating RN: Rogers Blocker Primary Care Leah Thornberry: Docia Chuck, Dibas Other Clinician: Referring Erum Cercone: Docia Chuck, Dibas Treating Mathews Stuhr/Extender: Rowan Blase in Treatment: 3 Vital Signs Height(in): 60 Pulse(bpm): 75 Weight(lbs): 212 Blood Pressure(mmHg): 135/78 Body Mass Index(BMI): 41 Temperature(F): 98.2 Respiratory Rate(breaths/min): 16 Photos: [N/A:N/A] Wound Location: Left, Distal, Medial Lower Leg N/A N/A Wounding Event: Gradually Appeared N/A N/A Primary Etiology: Venous Leg Ulcer N/A N/A Comorbid History: Cataracts, Glaucoma, Lymphedema, N/A N/A Hypertension, Type II Diabetes, Osteoarthritis Date Acquired: 08/15/2021 N/A N/A Weeks of Treatment: 3 N/A N/A Wound Status: Open N/A N/A Clustered Wound: Yes N/A N/A Measurements L x W x D (cm) 1x2x0.1 N/A N/A Area (cm) : 1.571 N/A N/A Volume (cm) : 0.157 N/A N/A %  Reduction in Area: -14.30% N/A N/A % Reduction in Volume: -14.60% N/A N/A Classification: Full Thickness Without Exposed N/A N/A Support Structures Exudate Amount: Medium N/A N/A Exudate Type: Serosanguineous N/A N/A Exudate Color: red, brown N/A N/A Wound Margin: Flat and Intact N/A N/A Granulation Amount: Large (67-100%) N/A N/A Granulation Quality: Red, Pink N/A N/A Necrotic Amount: None Present (0%) N/A N/A Exposed Structures: Fat Layer (Subcutaneous Tissue): N/A N/A Yes Fascia: No Tendon: No Muscle: No Joint: No Bone: No Epithelialization: Large (67-100%) N/A N/A Treatment Notes Electronic Signature(s) Signed: 09/13/2021 4:40:04 PM By: Rogers Blocker RN Entered By: Rogers Blocker on 09/13/2021 10:36:52 Jenna [name], Jenna [name] ([id number], ALOE ([id number]) -------------------------------------------------------------------------------- Multi-Disciplinary Care Plan Details Patient Name: Jenna [name] Date of Service: 09/13/2021 10:00 AM Medical Record Number: [id number] Patient Account Number: [id number] Date of Birth/Sex:  22-Sep-1931 (87 y.o. F) Treating RN: Rogers Blocker Primary Care Jesseca Marsch: Docia Chuck, Dibas Other Clinician: Referring Jeremaine Maraj: Docia Chuck, Dibas Treating Brynlee Pennywell/Extender: Rowan Blase in Treatment: 3 Active Inactive Venous Leg Ulcer Nursing Diagnoses: Knowledge deficit related to disease process and management Potential for venous Insuffiency (use before diagnosis confirmed) Goals: Patient will maintain optimal edema control Date Initiated: 08/22/2021 Target Resolution Date: 08/29/2021 Goal Status: Active Patient/caregiver will verbalize understanding of disease process and disease management Date Initiated: 08/22/2021 Target Resolution Date: 08/29/2021 Goal Status: Active Verify adequate tissue perfusion prior to therapeutic compression application Date Initiated: 08/22/2021 Target Resolution Date: 08/29/2021 Goal Status:  Active Interventions: Assess peripheral edema status every visit. Compression as ordered Provide education on venous insufficiency Treatment Activities: Therapeutic compression applied : 08/22/2021 Notes: Wound/Skin Impairment Nursing Diagnoses: Impaired tissue integrity Goals: Patient/caregiver will verbalize understanding of skin care regimen Date Initiated: 08/22/2021 Target Resolution Date: 09/05/2021 Goal Status: Active Ulcer/skin breakdown will have a volume reduction of 30% by week 4 Date Initiated: 08/22/2021 Target Resolution Date: 09/19/2021 Goal Status: Active Interventions: Assess patient/caregiver ability to perform ulcer/skin care regimen upon admission and as needed Assess ulceration(s) every visit Treatment Activities: Skin care regimen initiated : 08/22/2021 Topical wound management initiated : 08/22/2021 Notes: Electronic Signature(s) Signed: 09/13/2021 4:40:04 PM By: Rogers Blocker RN Jenna [name], Jenna [name] ([id number]) Entered By: Rogers Blocker on 09/13/2021 10:20:44 Jenna [name] ([id number]) -------------------------------------------------------------------------------- Pain Assessment Details Patient Name: Jenna [name] Date of Service: 09/13/2021 10:00 AM Medical Record Number: [id number] Patient Account Number: [id number] Date of Birth/Sex: 12-Jun-1931 (87 y.o. F) Treating RN: Rogers Blocker Primary Care Joannah Gitlin: Darrow Bussing Other Clinician: Referring Stanton Kissoon: Docia Chuck, Dibas Treating Macarthur Lorusso/Extender: Rowan Blase in Treatment: 3 Active Problems Location of Pain Severity and Description of Pain Patient Has Paino No Site Locations Rate the pain. Current Pain Level: 0 Pain Management and Medication Current Pain Management: Electronic Signature(s) Signed: 09/13/2021 4:40:04 PM By: Rogers Blocker RN Entered By: Rogers Blocker on 09/13/2021 10:11:15 Jenna [name]  ([id number]) -------------------------------------------------------------------------------- Patient/Caregiver Education Details Patient Name: Jenna [name] Date of Service: 09/13/2021 10:00 AM Medical Record Number: [id number] Patient Account Number: [id number] Date of Birth/Gender: 11-Oct-1931 (87 y.o. F) Treating RN: Rogers Blocker Primary Care Physician: Darrow Bussing Other Clinician: Referring Physician: Docia Chuck, Dibas Treating Physician/Extender: Rowan Blase in Treatment: 3 Education Assessment Education Provided To: Patient Education Topics Provided Wound/Skin Impairment: Methods: Explain/Verbal Responses: State content correctly Electronic Signature(s) Signed: 09/13/2021 4:40:04 PM By: Rogers Blocker RN Entered By: Rogers Blocker on 09/13/2021 10:38:05 Jenna [name] ([id number]) -------------------------------------------------------------------------------- Wound Assessment Details Patient Name: Jenna [name] Date of Service: 09/13/2021 10:00 AM Medical Record Number: [id number] Patient Account Number: [id number] Date of Birth/Sex: 1931-06-13 (87 y.o. F) Treating RN: Rogers Blocker Primary Care Bettyjean Stefanski: Docia Chuck, Dibas Other Clinician: Referring Omarrion Carmer: Docia Chuck, Dibas Treating Collen Vincent/Extender: Rowan Blase in Treatment: 3 Wound Status Wound Number: 5 Primary Venous Leg Ulcer Etiology: Wound Location: Left, Distal, Medial Lower Leg Wound Status: Open Wounding Event: Gradually Appeared Comorbid Cataracts, Glaucoma, Lymphedema, Hypertension, Type Date Acquired: 08/15/2021 History: II Diabetes, Osteoarthritis Weeks Of Treatment: 3 Clustered Wound: Yes Photos Wound Measurements Length: (cm) 1 Width: (cm) 2 Depth: (cm) 0.1 Area: (cm) 1.571 Volume: (cm) 0.157 % Reduction in Area: -14.3% % Reduction in Volume: -14.6% Epithelialization: Large (67-100%) Tunneling: No Undermining: No Wound Description Classification: Full Thickness  Without Exposed Support Structu Wound Margin: Flat and Intact Exudate Amount: Medium Exudate Type: Serosanguineous Exudate Color: red, brown res Foul Odor After Cleansing:  No Slough/Fibrino No Wound Bed Granulation Amount: Large (67-100%) Exposed Structure Granulation Quality: Red, Pink Fascia Exposed: No Necrotic Amount: None Present (0%) Fat Layer (Subcutaneous Tissue) Exposed: Yes Tendon Exposed: No Muscle Exposed: No Joint Exposed: No Bone Exposed: No Treatment Notes Wound #5 (Lower Leg) Wound Laterality: Left, Medial, Distal Cleanser Soap and Water Discharge Instruction: Gently cleanse wound with antibacterial soap, rinse and pat dry prior to dressing wounds Peri-Wound Care Jenna [name], Jenna [name] ([id number]) Moisturizing Lotion Discharge Instruction: Suggestions: Theraderm, Eucerin, Cetaphil, or patient preference. Topical Primary Dressing Xeroform-HBD 2x2 (in/in) Discharge Instruction: Apply Xeroform-HBD 2x2 (in/in) as directed Secondary Dressing Gauze Discharge Instruction: Apply dry gauze as secondary Secured With Compression Wrap Profore Lite LF 3 Multilayer Compression Bandaging System Discharge Instruction: Apply 3 multi-layer wrap as prescribed. Compression Stockings Add-Ons Electronic Signature(s) Signed: 09/13/2021 4:40:04 PM By: Rogers Blocker RN Entered By: Rogers Blocker on 09/13/2021 10:17:29 Jenna [name] ([id number]) -------------------------------------------------------------------------------- Vitals Details Patient Name: Jenna [name] Date of Service: 09/13/2021 10:00 AM Medical Record Number: [id number] Patient Account Number: [id number] Date of Birth/Sex: 1931/10/13 (87 y.o. F) Treating RN: Rogers Blocker Primary Care Momen Ham: Docia Chuck, Dibas Other Clinician: Referring Kel Senn: Docia Chuck, Dibas Treating Courtney Bellizzi/Extender: Rowan Blase in Treatment: 3 Vital Signs Time Taken: 10:08 Temperature (F): 98.2 Height (in): 60 Pulse  (bpm): 75 Weight (lbs): 212 Respiratory Rate (breaths/min): 16 Body Mass Index (BMI): 41.4 Blood Pressure (mmHg): 135/78 Reference Range: 80 - 120 mg / dl Electronic Signature(s) Signed: 09/13/2021 4:40:04 PM By: Rogers Blocker RN Entered By: Rogers Blocker on 09/13/2021 10:11:07

## 2021-09-16 ENCOUNTER — Ambulatory Visit: Payer: Medicare Other | Admitting: Physical Therapy

## 2021-09-18 ENCOUNTER — Other Ambulatory Visit: Payer: Self-pay

## 2021-09-18 ENCOUNTER — Ambulatory Visit: Payer: Medicare Other | Admitting: Physical Therapy

## 2021-09-18 ENCOUNTER — Encounter: Payer: Self-pay | Admitting: Physical Therapy

## 2021-09-18 DIAGNOSIS — M25511 Pain in right shoulder: Secondary | ICD-10-CM | POA: Diagnosis not present

## 2021-09-18 DIAGNOSIS — M6281 Muscle weakness (generalized): Secondary | ICD-10-CM | POA: Diagnosis not present

## 2021-09-18 DIAGNOSIS — R293 Abnormal posture: Secondary | ICD-10-CM | POA: Diagnosis not present

## 2021-09-18 DIAGNOSIS — M542 Cervicalgia: Secondary | ICD-10-CM | POA: Diagnosis not present

## 2021-09-18 NOTE — Therapy (Signed)
[facility] [address] [place], [place], [address] Phone: [phone]   Fax:  [phone]  Physical Therapy Treatment  Patient Details  Name: Jenna [name] MRN: [id number] Date of Birth: September 17, 1931 Referring Provider (PT): Dibas Docia Chuck, MD   Encounter Date: 09/18/2021   PT End of Session - 09/18/21 1158     Visit Number 3    Date for PT Re-Evaluation 10/17/21    Authorization Type Medicare A and B    Authorization Time Period 09/05/21 to 10/17/21    Progress Note Due on Visit 10    PT Start Time 1115   Pt early and PT had cancellation   PT Stop Time 1155    PT Time Calculation (min) 40 min    Activity Tolerance Patient tolerated treatment well;No increased pain    Behavior During Therapy WFL for tasks assessed/performed             Past Medical History:  Diagnosis Date   Arthritis    Cancer (HCC)    endometrial cancer   Cellulitis of left lower extremity    Diabetes mellitus    Hyperlipemia    Hypertension    Non-healing ulcer of lower leg (HCC)    Left leg     OAB (overactive bladder)    Obesity    BMI 45    Past Surgical History:  Procedure Laterality Date   ABDOMINAL HYSTERECTOMY  02/2008   robotic hyst BSO Bilateral LND   EYE SURGERY     cataract   IR GENERIC HISTORICAL  09/30/2016   IR EMBO VENOUS NOT HEMORR HEMANG  INC [facility]IDE ROADMAPPING [facility] ULTRASOUND   JOINT REPLACEMENT     knee   KNEE SURGERY     left knee   LAPAROSCOPIC CHOLECYSTECTOMY SINGLE PORT N/A 07/04/2014   Procedure: LAPAROSCOPIC CHOLECYSTECTOMY SINGLE PORT;  Surgeon: Ardeth Sportsman, MD;  Location: [facility];  Service: General;  Laterality: N/A;   ROTATOR CUFF REPAIR     right arm   TUBAL LIGATION      There were no vitals filed for this visit.   Subjective Assessment - 09/18/21 1116     Subjective My shoulder is feeling a lot better.  I am using it more with less pain.    Pertinent History HTN, Diabetes, HLD     Diagnostic tests none recent    Patient Stated Goals decrease pain in the neck/shoulder    Currently in Pain? No/denies    Pain Location Shoulder                               [facility] Adult PT Treatment/Exercise - 09/18/21 0001       Exercises   Exercises Shoulder      Shoulder Exercises: Supine   Horizontal ABduction Strengthening;Both;20 reps;Theraband    Theraband Level (Shoulder Horizontal ABduction) Level 1 (Yellow)    Other Supine Exercises dowel +2lb ankle weight chest press 1x15    Other Supine Exercises dowel +2lb flexion x 10      Shoulder Exercises: Standing   Extension Strengthening;Right;15 reps;Theraband    Theraband Level (Shoulder Extension) Level 1 (Yellow)    Row Strengthening;Both;20 reps;Theraband    Theraband Level (Shoulder Row) Level 1 (Yellow)      Shoulder Exercises: ROM/Strengthening   Ranger seated flexion and circles x 10 each at 90 deg    Other ROM/Strengthening Exercises finger ladder 3 rounds  Manual Therapy   Manual Therapy Soft tissue mobilization;Joint mobilization    Manual therapy comments seated and supine    Joint Mobilization seated Rt thoracic facets for ext T2-T6, rib springing, scapular retraction and depression, AC joint Gr II/III    Soft tissue mobilization Rt upper trap, scalenes, pectorals, bicep tendon, posterior GH joint                       PT Short Term Goals - 09/05/21 1149       PT SHORT TERM GOAL #1   Title Pt will be independent with her initial HEP to improve ROM and strength.    Time 3    Period Weeks    Status New               PT Long Term Goals - 09/05/21 1150       PT LONG TERM GOAL #1   Title Pt will have atleast 45 deg of active cervical rotation.    Time 6    Period Weeks    Status New      PT LONG TERM GOAL #2   Title Pt will have atleast 4/5MMT strength of the Rt shoulder to improve her ability to reach overhead and complete other daily activity without  assistance.    Time 6    Period Weeks    Status New      PT LONG TERM GOAL #3   Title Pt will report atleast 50% improvement in her Rt shoulder/neck pain from the start of PT/    Time 6    Period Weeks    Status New      PT LONG TERM GOAL #4   Title Pt will be able to reach overhead into a cabinet x10 reps without the need for assistance or increase in shoulder pain.    Time 6    Period Weeks    Status New      PT LONG TERM GOAL #5   Title Pt will have improved scapular control evident by her ability to sit through as session without the need for PT cuing to decrease Rt shoulder shrug.    Time 6    Period Weeks    Status New                   Plan - 09/18/21 1159     Clinical Impression Statement Pt reports signif reduction in pain and ability to use Rt UE more with daily activities.  She arrived painfree and demo'd A/ROM Rt shoulder flexion to 125 deg without pain.  She was able to progress reps and resistance today including from more upright postures of sitting and standing for ther ex today.  PT performed STM and joint mobs to optimize Rt shoulder girdle posture and mobility.  PT encouraged Pt to continue using Rt UE throughout day as pain allows.  Continue along POC.    Comorbidities history of Rt RTC repair    Rehab Potential Good    PT Frequency 2x / week    PT Duration 6 weeks    PT Treatment/Interventions ADLs/Self Care Home Management;Cryotherapy;Moist Heat;Electrical Stimulation;Neuromuscular re-education;Therapeutic exercise;Therapeutic activities;Patient/family education;Manual techniques;Dry needling;Taping    PT Next Visit Plan continue AA/ROM and A/ROM, strengthening for scapular stab and RC, functional ROM, manual therapy for optimizing mobility of Rt shoulder girdle    PT Home Exercise Plan [id number]    Consulted and Agree with Plan of Care Patient  Patient will benefit from skilled therapeutic intervention in order to improve the  following deficits and impairments:     Visit Diagnosis: Right shoulder pain, unspecified chronicity  Cervicalgia  Muscle weakness (generalized)     Problem List Patient Active Problem List   Diagnosis Date Noted   OSA (obstructive sleep apnea) 02/08/2017   Murmur, cardiac 12/31/2016   Morbid obesity (HCC) 12/31/2016   Cellulitis of left leg 07/07/2016   Venous stasis ulcer of left lower extremity (HCC) 07/07/2016   Chronic cholecystitis with calculus s/p lap chole 07/04/2014 07/03/2014   HTN (hypertension) 05/31/2012   Non-insulin dependent type 2 diabetes mellitus (HCC) 05/31/2012   Hypercholesteremia 05/31/2012   OAB (overactive bladder)    Malignant neoplasm of corpus uteri, except isthmus (HCC) 12/10/2011    Class: Stage 1   Estrellita Lasky, PT 09/18/21 12:03 PM   [facility] [address] [place], [place], [address] Phone: [phone]   Fax:  [phone]  Name: Jenna [name] MRN: [id number] Date of Birth: 07/16/1931

## 2021-09-20 ENCOUNTER — Other Ambulatory Visit: Payer: Self-pay

## 2021-09-20 ENCOUNTER — Encounter: Payer: Medicare Other | Admitting: Physician Assistant

## 2021-09-20 DIAGNOSIS — I10 Essential (primary) hypertension: Secondary | ICD-10-CM | POA: Diagnosis not present

## 2021-09-20 DIAGNOSIS — I890 Lymphedema, not elsewhere classified: Secondary | ICD-10-CM | POA: Diagnosis not present

## 2021-09-20 DIAGNOSIS — I872 Venous insufficiency (chronic) (peripheral): Secondary | ICD-10-CM | POA: Diagnosis not present

## 2021-09-20 DIAGNOSIS — E11622 Type 2 diabetes mellitus with other skin ulcer: Secondary | ICD-10-CM | POA: Diagnosis not present

## 2021-09-20 DIAGNOSIS — L97821 Non-pressure chronic ulcer of other part of left lower leg limited to breakdown of skin: Secondary | ICD-10-CM | POA: Diagnosis not present

## 2021-09-20 DIAGNOSIS — M1990 Unspecified osteoarthritis, unspecified site: Secondary | ICD-10-CM | POA: Diagnosis not present

## 2021-09-20 NOTE — Progress Notes (Signed)
Jenna [name], Jenna [name] ([id number]) Visit Report for 09/20/2021 Arrival Information Details Patient Name: Jenna [name], Jenna [name]. Date of Service: 09/20/2021 3:30 PM Medical Record Number: [id number] Patient Account Number: [id number] Date of Birth/Sex: 08-Sep-1931 (87 y.o. F) Treating RN: Jenna [name] Primary Care Clarke Amburn: Jenna [name], Jenna [name] Other Clinician: Referring Gilad Dugger: Jenna [name], Jenna [name] Treating Bravlio Luca/Extender: Jenna [name] in Treatment: 4 Visit Information History Since Last Visit Pain Present Now: No Patient Arrived: Cane Arrival Time: 15:44 Accompanied By: self Transfer Assistance: None Patient Identification Verified: Yes Secondary Verification Process Completed: Yes Patient Requires Transmission-Based No Precautions: Patient Has Alerts: Yes Patient Alerts: Patient on Blood Thinner aspirin 325 Type II Diabetic ABI 03/25/2021 L)1.28 Electronic Signature(s) Signed: 09/20/2021 4:36:36 PM By: Jenna Amen RN Entered By: Jenna [name] on 09/20/2021 15:46:20 Jenna [name], Jenna [name] ([id number]) -------------------------------------------------------------------------------- Clinic Level of Care Assessment Details Patient Name: Jenna [name] Date of Service: 09/20/2021 3:30 PM Medical Record Number: [id number] Patient Account Number: [id number] Date of Birth/Sex: [birth year] (87 y.o. F) Treating RN: Jenna [name] Primary Care Jeiry Birnbaum: Jenna [name], Jenna [name] Other Clinician: Referring Aiva Miskell: Jenna [name], Jenna [name] Treating Zorah Backes/Extender: Jenna [name] in Treatment: 4 Clinic Level of Care Assessment Items TOOL 1 Quantity Score []  - Use when EandM and Procedure is performed on INITIAL visit 0 ASSESSMENTS - Nursing Assessment / Reassessment []  - General Physical Exam (combine w/ comprehensive assessment (listed just below) when performed on new 0 pt. evals) []  - 0 Comprehensive Assessment (HX, ROS, Risk Assessments, Wounds Hx, etc.) ASSESSMENTS - Wound and Skin Assessment /  Reassessment []  - Dermatologic / Skin Assessment (not related to wound area) 0 ASSESSMENTS - Ostomy and/or Continence Assessment and Care []  - Incontinence Assessment and Management 0 []  - 0 Ostomy Care Assessment and Management (repouching, etc.) PROCESS - Coordination of Care []  - Simple Patient / Family Education for ongoing care 0 []  - 0 Complex (extensive) Patient / Family Education for ongoing care []  - 0 Staff obtains Programmer, systems, Records, Test Results / Process Orders []  - 0 Staff telephones HHA, Nursing Homes / Clarify orders / etc []  - 0 Routine Transfer to another Facility (non-emergent condition) []  - 0 Routine Hospital Admission (non-emergent condition) []  - 0 New Admissions / Biomedical engineer / Ordering NPWT, Apligraf, etc. []  - 0 Emergency Hospital Admission (emergent condition) PROCESS - Special Needs []  - Pediatric / Minor Patient Management 0 []  - 0 Isolation Patient Management []  - 0 Hearing / Language / Visual special needs []  - 0 Assessment of Community assistance (transportation, D/C planning, etc.) []  - 0 Additional assistance / Altered mentation []  - 0 Support Surface(s) Assessment (bed, cushion, seat, etc.) INTERVENTIONS - Miscellaneous []  - External ear exam 0 []  - 0 Patient Transfer (multiple staff / Civil Service fast streamer / Similar devices) []  - 0 Simple Staple / Suture removal (25 or less) []  - 0 Complex Staple / Suture removal (26 or more) []  - 0 Hypo/Hyperglycemic Management (do not check if billed separately) []  - 0 Ankle / Brachial Index (ABI) - do not check if billed separately Has the patient been seen at the hospital within the last three years: Yes Total Score: 0 Level Of Care: ____ Jenna [name] ([id number]) Electronic Signature(s) Signed: 09/20/2021 4:36:36 PM By: Jenna Amen RN Entered By: Jenna [name] on 09/20/2021 16:19:10 Jenna [name]  ([id number]) -------------------------------------------------------------------------------- Compression Therapy Details Patient Name: Jenna [name] Date of Service: 09/20/2021 3:30 PM Medical Record Number: [id number] Patient Account Number: [id number] Date of Birth/Sex: April 16, 1931 (87 y.o. F) Treating RN: Jenna [name],  Jenna [name], Jenna [name] ([id number]) Visit Report for 09/20/2021 Arrival Information Details Patient Name: Jenna [name], Jenna [name]. Date of Service: 09/20/2021 3:30 PM Medical Record Number: [id number] Patient Account Number: [id number] Date of Birth/Sex: 08-Sep-1931 (87 y.o. F) Treating RN: Jenna [name] Primary Care Clarke Amburn: Jenna [name], Jenna [name] Other Clinician: Referring Gilad Dugger: Jenna [name], Jenna [name] Treating Bravlio Luca/Extender: Jenna [name] in Treatment: 4 Visit Information History Since Last Visit Pain Present Now: No Patient Arrived: Cane Arrival Time: 15:44 Accompanied By: self Transfer Assistance: None Patient Identification Verified: Yes Secondary Verification Process Completed: Yes Patient Requires Transmission-Based No Precautions: Patient Has Alerts: Yes Patient Alerts: Patient on Blood Thinner aspirin 325 Type II Diabetic ABI 03/25/2021 L)1.28 Electronic Signature(s) Signed: 09/20/2021 4:36:36 PM By: Jenna Amen RN Entered By: Jenna [name] on 09/20/2021 15:46:20 Jenna [name], Jenna [name] ([id number]) -------------------------------------------------------------------------------- Clinic Level of Care Assessment Details Patient Name: Jenna [name] Date of Service: 09/20/2021 3:30 PM Medical Record Number: [id number] Patient Account Number: [id number] Date of Birth/Sex: [birth year] (87 y.o. F) Treating RN: Jenna [name] Primary Care Jeiry Birnbaum: Jenna [name], Jenna [name] Other Clinician: Referring Aiva Miskell: Jenna [name], Jenna [name] Treating Zorah Backes/Extender: Jenna [name] in Treatment: 4 Clinic Level of Care Assessment Items TOOL 1 Quantity Score []  - Use when EandM and Procedure is performed on INITIAL visit 0 ASSESSMENTS - Nursing Assessment / Reassessment []  - General Physical Exam (combine w/ comprehensive assessment (listed just below) when performed on new 0 pt. evals) []  - 0 Comprehensive Assessment (HX, ROS, Risk Assessments, Wounds Hx, etc.) ASSESSMENTS - Wound and Skin Assessment /  Reassessment []  - Dermatologic / Skin Assessment (not related to wound area) 0 ASSESSMENTS - Ostomy and/or Continence Assessment and Care []  - Incontinence Assessment and Management 0 []  - 0 Ostomy Care Assessment and Management (repouching, etc.) PROCESS - Coordination of Care []  - Simple Patient / Family Education for ongoing care 0 []  - 0 Complex (extensive) Patient / Family Education for ongoing care []  - 0 Staff obtains Programmer, systems, Records, Test Results / Process Orders []  - 0 Staff telephones HHA, Nursing Homes / Clarify orders / etc []  - 0 Routine Transfer to another Facility (non-emergent condition) []  - 0 Routine Hospital Admission (non-emergent condition) []  - 0 New Admissions / Biomedical engineer / Ordering NPWT, Apligraf, etc. []  - 0 Emergency Hospital Admission (emergent condition) PROCESS - Special Needs []  - Pediatric / Minor Patient Management 0 []  - 0 Isolation Patient Management []  - 0 Hearing / Language / Visual special needs []  - 0 Assessment of Community assistance (transportation, D/C planning, etc.) []  - 0 Additional assistance / Altered mentation []  - 0 Support Surface(s) Assessment (bed, cushion, seat, etc.) INTERVENTIONS - Miscellaneous []  - External ear exam 0 []  - 0 Patient Transfer (multiple staff / Civil Service fast streamer / Similar devices) []  - 0 Simple Staple / Suture removal (25 or less) []  - 0 Complex Staple / Suture removal (26 or more) []  - 0 Hypo/Hyperglycemic Management (do not check if billed separately) []  - 0 Ankle / Brachial Index (ABI) - do not check if billed separately Has the patient been seen at the hospital within the last three years: Yes Total Score: 0 Level Of Care: ____ Jenna [name] ([id number]) Electronic Signature(s) Signed: 09/20/2021 4:36:36 PM By: Jenna Amen RN Entered By: Jenna [name] on 09/20/2021 16:19:10 Jenna [name]  ([id number]) -------------------------------------------------------------------------------- Compression Therapy Details Patient Name: Jenna [name] Date of Service: 09/20/2021 3:30 PM Medical Record Number: [id number] Patient Account Number: [id number] Date of Birth/Sex: April 16, 1931 (87 y.o. F) Treating RN: Jenna [name],  Jenna [name]: Jenna [name], Jenna [name] Treating Elexius Minar/Extender: Jenna [name] in Treatment: 4 Active Inactive Electronic Signature(s) Signed: 09/20/2021 4:36:36 PM By: Jenna Amen RN Entered By: Jenna [name] on 09/20/2021 16:17:43 Jenna [name], Jenna [name] ([id number]) -------------------------------------------------------------------------------- Pain Assessment Details Patient Name: Jenna [name] Date of Service: 09/20/2021 3:30 PM Medical Record Number: [id number] Patient Account Number: [id number] Date of Birth/Sex: 1931-06-06 (87 y.o. F) Treating RN: Jenna [name] Primary Care Brieanna Nau: Jenna [name] Other Clinician: Referring Jenna [name]: Jenna [name], Jenna [name] Treating Lilyauna Miedema/Extender: Jenna [name] in Treatment: 4 Active Problems Location of Pain Severity and Description of Pain Patient Has Paino No Site Locations Rate the pain. Current Pain  Level: 0 Pain Management and Medication Current Pain Management: Electronic Signature(s) Signed: 09/20/2021 4:36:36 PM By: Jenna Amen RN Entered By: Jenna [name] on 09/20/2021 15:49:29 Jenna [name] ([id number]) -------------------------------------------------------------------------------- Patient/Caregiver Education Details Patient Name: Jenna [name] Date of Service: 09/20/2021 3:30 PM Medical Record Number: [id number] Patient Account Number: [id number] Date of Birth/Gender: 01/20/1931 (87 y.o. F) Treating RN: Jenna [name] Primary Care Physician: Jenna [name] Other Clinician: Referring Physician: Dorthy [name], Jenna [name] Treating Physician/Extender: Jenna [name] in Treatment: 4 Education Assessment Education Provided To: Patient Education Topics Provided Wound/Skin Impairment: Methods: Explain/Verbal Responses: State content correctly Electronic Signature(s) Signed: 09/20/2021 4:36:36 PM By: Jenna Amen RN Entered By: Jenna [name] on 09/20/2021 16:19:32 Dealmeida, Jenna [name] ([id number]) -------------------------------------------------------------------------------- Wound Assessment Details Patient Name: Jenna [name] Date of Service: 09/20/2021 3:30 PM Medical Record Number: [id number] Patient Account Number: [id number] Date of Birth/Sex: [birth year] (87 y.o. F) Treating RN: Jenna [name] Primary Care Doni Widmer: Jenna [name], Jenna [name] Other Clinician: Referring Keijuan Schellhase: Jenna [name], Jenna [name] Treating Tremeka Helbling/Extender: Jenna [name] in Treatment: 4 Wound Status Wound Number: 5 Primary Venous Leg Ulcer Etiology: Wound Location: Left, Distal, Medial Lower Leg Wound Status: Healed - Epithelialized Wounding Event: Gradually Appeared Comorbid Cataracts, Glaucoma, Lymphedema, Hypertension, Type Date Acquired: 08/15/2021 History: II Diabetes, Osteoarthritis Weeks Of Treatment: 4 Clustered Wound: Yes Photos Wound Measurements Length: (cm) 0 Width: (cm)  0 Depth: (cm) 0 Area: (cm) 0 Volume: (cm) 0 % Reduction in Area: 100% % Reduction in Volume: 100% Epithelialization: Large (67-100%) Tunneling: No Undermining: No Wound Description Classification: Full Thickness Without Exposed Support Structure Wound Margin: Flat and Intact Exudate Amount: None Present s Foul Odor After Cleansing: No Slough/Fibrino No Wound Bed Granulation Amount: None Present (0%) Exposed Structure Necrotic Amount: None Present (0%) Fascia Exposed: No Fat Layer (Subcutaneous Tissue) Exposed: No Tendon Exposed: No Muscle Exposed: No Joint Exposed: No Bone Exposed: No Electronic Signature(s) Signed: 09/20/2021 4:36:36 PM By: Jenna Amen RN Entered By: Jenna [name] on 09/20/2021 16:04:44 Jenna [name] ([id number]) -------------------------------------------------------------------------------- Ovid Details Patient Name: Jenna [name] Date of Service: 09/20/2021 3:30 PM Medical Record Number: [id number] Patient Account Number: [id number] Date of Birth/Sex: [birth year] (87 y.o. F) Treating RN: Jenna [name] Primary Care Aryahna Spagna: Jenna [name], Jenna [name] Other Clinician: Referring Tiyonna Sardinha: Jenna [name], Jenna [name] Treating Terree Gaultney/Extender: Jenna [name] in Treatment: 4 Vital Signs Time Taken: 15:47 Temperature (F): 97.9 Height (in): 60 Pulse (bpm): 78 Weight (lbs): 212 Respiratory Rate (breaths/min): 18 Body Mass Index (BMI): 41.4 Blood Pressure (mmHg): 165/88 Reference Range: 80 - 120 mg / dl Electronic Signature(s) Signed: 09/20/2021 4:36:36 PM By: Jenna Amen RN Entered By: Jenna [name] on 09/20/2021 15:49:12

## 2021-09-20 NOTE — Progress Notes (Addendum)
Jenna, [name] ([id number]) Visit Report for 09/20/2021 Chief Complaint Document Details Patient Name: Jenna [name], Jenna [name]. Date of Service: 09/20/2021 3:30 PM Medical Record Number: [id number] Patient Account Number: [id number] Date of Birth/Sex: 05-Jun-1931 (87 y.o. F) Treating RN: Dolan Amen Primary Care Provider: Lujean Amel Other Clinician: Referring Provider: [name], Dibas Treating Provider/Extender: Skipper Cliche in Treatment: 4 Information Obtained from: Patient Chief Complaint Left LE Ulcer Electronic Signature(s) Signed: 09/20/2021 4:00:27 PM By: Worthy Keeler PA-C Entered By: Worthy Keeler on 09/20/2021 16:00:27 Jenna [name] ([id number]) -------------------------------------------------------------------------------- HPI Details Patient Name: Jenna [name] Date of Service: 09/20/2021 3:30 PM Medical Record Number: [id number] Patient Account Number: [id number] Date of Birth/Sex: [birth year] (87 y.o. F) Treating RN: Dolan Amen Primary Care Provider: Lujean Amel Other Clinician: Referring Provider: [name], Dibas Treating Provider/Extender: Skipper Cliche in Treatment: 4 History of Present Illness Location: left lower extremity in the medial ankle region Quality: Patient reports experiencing a dull pain to affected area(s). Severity: Patient states wound are getting worse. Duration: Patient has had the wound for > 3 months prior to seeking treatment at the wound center Timing: Pain in wound is Intermittent (comes and goes Context: The wound appeared gradually over time Modifying Factors: Other treatment(s) tried include:[name] Associated Signs and Symptoms: Patient reports having increase swelling. HPI Description: 87 year old patient with a past medical history significant for venous stasis ulceration and diabetes mellitus was recently admitted to the hospital between 07/07/2016 and  07/09/2016. She was wound to have a nonpurulent cellulitis of the left lower extremity and was started on IV antibiotics which included vancomycin. He is discharged home with her and Unna's boots and oral doxycycline. During this admission there was no obvious DVT involving the left lower extremity. her past medical history significant for diabetes mellitus, hypertension, hyperlipidemia, varicose veins, status post right rotator cuff repair, knee surgery on the left,robotic abdominal hysterectomy, laparoscopic cholecystectomy. She is not a smoker. Venous study done on 07/18/2015 showed normal left extremity deep venous system with no evidence of DVT. There was extensive valvular incompetence and reflux throughout the left greater saphenous vein with direct the medication to subcutaneous varicose veins. Normal left small saphenous vein. I understand she was seen by vascular radiology group and the workup and recommendation was for endovenous ablation but due to family pressure she was not able to keep her appointment a year ago. She has not been wearing her compression stockings regularly. 05/26/18 READMISSION this is an 87 year old woman who was previously seen in this clinic in 2017 by Dr. Con Memos. She has chronic venous insufficiency with lymphedema and at that time had open area on the left medial calf and ankle area. Was recommended that she wear 20-30 mm compression stockings and the patient tells me that she has been compliant with this although I think her primary doctor recently told her that she didn't need to wear stockings out of fear it might cause arterial compression. She noticed edema and pain in the area a week to 2 ago. She saw her primary doctor and was given doxycycline and Silvadene cream to put on the area. She states things are a lot better. The patient has a history of type 2 diabetes on oral agents but she is unaware of her hemoglobin A1c for her blood glucose which she  doesn't check. She has hypertension, varicose veins, rotator cuff repair, knee surgery on the left, history of a laparoscopic cholecystectomy, lymphedema, obstructive sleep apnea, history  of endometrial CA. The patient has been to see vein and vascular in the past and I think has had an ablation of the left greater saphenous vein based on ultrasounds of the left leg IC dating back to 2017. Her most recent ultrasound was in May 2018 which showed no evidence of a DVT and continued durable closure of the treated segment of the left greater saphenous vein. Patent lateral accessory branch of the greater saphenous vein extending to the calf ABIs in our clinic were 1.05 on the right and 0.95 on the left 06/02/18 the patient's wound on the left medial lower calf is completely closed and epithelialized. She has new 20-30 mm below-knee stockings READMISSION 11/02/2019 This is a now 87 year old woman who has been in this clinic 2 times before. Most recently in 2019. She has chronic venous insufficiency with some degree of secondary lymphedema. She has had a history of a left greater saphenous vein ablation. When she is here in 2019 she had a wound on the left medial lower calf. This closed fairly easily. It was recommended she wear 20/30 mm below-knee stockings she is not compliant with this. She arrives in clinic with a 2-week history of a left medial lower extremity and ankle wound. Some of this has dry slough on it but most of it is already epithelialize she has been using a combination of Vaseline and/or Silvadene. She is not wearing any compression. She has a history of chronic venous reflux. There was recommendations in the past for ablations I do not know that she ever carried through with this. According to notes from 2016 this work-up was done via the interventional radiology group. We will need to research this. She is probably going to need a consultation ABIs in our clinic were 1.03 on the right  and 0.93 on the left 11/11; patient's wound looks as though it is epithelializing horizontal wound on the left medial lower extremity. She has a superior satellite lesion. She is complaining of a lot of pain in 3 layer compression. There have been recommendations for previous ablations I am not sure if she followed up on this. She has been noncompliant with stockings although she brought those into the clinic today. 11/18; the patient had a repeat venous reflux studies at vein and vascular. This did not show any DVT in the left lower extremity there was no evidence of chronic venous insufficiency and no evidence of superficial vein thrombosis. I looked back at previous studies done I think in interventional radiology in 2018 would suggest that there had been previous ablation of a segment of the left greater saphenous vein. By review of the new study I do not think the patient needs to see vein and vascular however I find these studies increasingly difficult to interpret. I am not sure that the patient has actually consistently worn compression stockings and I think that is the next step here. The wound is just about closed MOON, BUDDE ([id number]) 11/22/2019 upon evaluation today patient actually appears to be healed based on what I am seeing today. She has been tolerating the dressing changes without complication. Fortunately there is no signs of active infection at this time. Overall I feel like even though this is the first time of seeing her that she has actually done extremely well with the current wound care measures again she has been under the care of Dr. Dellia Nims. 12/2 the patient was expected to be healed this week however she comes in with 3  small wounds that look much the same as the pictures from 2 weeks ago. These are all in the area of the left medial lower extremity. She was put into her own compression stockings last week I do not think the edema control was adequate in this area  for healing. We have been using Hydrofera Blue 12/9; wound is fully epithelialized but still looks vulnerable on the left medial lower extremity. Surrounding venous inflammation. I think she has lymphedema with fibrosed skin in the distal lower leg. [Inverted bottle sign]. She has had venous reflux studies that have not shown any of the superficial veins to be amenable to ablations. This is been repeated during this visit. I am not really convinced that she has been wearing compression stockings although she certainly has them. 12/23; patient's wound on the left medial lower extremity is totally healed. She has surrounding venous inflammation and skin damage related to there is also some degree of lymphedema and fibrosed skin in the distal lower extremity. She has her compression stocking Readmission: 03/25/2021 upon evaluation today patient appears to be doing excellent in regard to her wound all things considered. She does appear to have gotten very quickly this time which is great news compared to before when she tells me she let the wound get somewhat out of control. Nonetheless right now she tells me this has been present for about 3 weeks she has been using Vaseline on it. She does have a history of diabetes though she has not taken Metformin for years she sees her primary care provider on Friday and she will see what he says but she is hoping he would not put her back on this. Subsequently she also does have Lasix that she is not been taking it recently. She has a past medical history significant for venous stasis, lymphedema, diabetes mellitus type 2, and hypertension. 04/01/2021 on evaluation today patient appears to be doing well with regard to her wound. There is no signs of active infection at this time. No fevers, chills, nausea, vomiting, or diarrhea. 04/08/2021 upon evaluation today patient appears to be doing excellent in regard to her wounds currently. She has been tolerating the  dressing changes and in fact appears to be completely healed which is great news. Readmission: 08/22/2021 patient presents today for reevaluation here in the clinic concerning a reopening of the wound on the left medial ankle/lower leg region which is the same area I took care of earlier in the year between March and April. Fortunately there does not appear to be anything to do a peer which is good news. I do think that as before this can probably heal fatherly rapidly. Nonetheless I do believe that based on what we are seeing she probably needs a compression wrap at this time. Her medical history really is not changed. Her ABI back in March of this year was 1.28 on this left leg and was doing well. 08/29/2021 upon evaluation today patient appears to be doing well. With regard to her wound she is actually showing signs of good granulation. I am very pleased in that regard. There does not appear to be any evidence of active infection at this time. 09/06/2021 upon evaluation today patient's wound actually showing signs of doing very well as far as granulation epithelization is concerned. Fortunately I do not see any need for sharp debridement today and very pleased in that regard. There does seem to be some issue here with still continued drainage but nonetheless I think that  the compression wrap is doing a very good job in helping to mitigate this as well. Overall I think that we are headed in an appropriate direction towards getting this completely closed. 09/13/2021 upon evaluation today patient appears to be doing better in regard to the overall measurement today. In fact the measurement really does not speak to as much as this is healed due to the fact that some of the areas that are open are somewhat scattered. In general I am extremely pleased with where we stand today with healing. The patient likewise is also extremely happy with what she is seeing and hopefully will get this closed pretty  soon. 09/20/2021 upon evaluation today patient appears to be doing well at this point with regard to her wound. She has been tolerating the dressing changes without complication. Fortunately there does not appear to be any signs of active infection at this time. In fact I feel like this is probably completely healed. Electronic Signature(s) Signed: 09/20/2021 4:43:23 PM By: Worthy Keeler PA-C Entered By: Worthy Keeler on 09/20/2021 16:43:23 Parslow, [name] ([id number]) -------------------------------------------------------------------------------- Physical Exam Details Patient Name: Jenna [name] Date of Service: 09/20/2021 3:30 PM Medical Record Number: [id number] Patient Account Number: [id number] Date of Birth/Sex: 1931-03-01 (87 y.o. F) Treating RN: Dolan Amen Primary Care Provider: Lujean Amel Other Clinician: Referring Provider: [name], Dibas Treating Provider/Extender: Skipper Cliche in Treatment: 4 Constitutional Well-nourished and well-hydrated in no acute distress. Respiratory normal breathing without difficulty. Psychiatric this patient is able to make decisions and demonstrates good insight into disease process. Alert and Oriented x 3. pleasant and cooperative. Notes Upon inspection I am definitely seeing evidence of improved overall healing and in fact I think she has new skin covering the entirety of the wound bed. Fortunately there does not appear to be any signs of active infection which is great news and overall very pleased I do think that she is doing well with compression wrap edema this is healed I think it would be worthwhile for [place] to continue with the wrap 1 more week to allow the wound and newly healed skin to toughen up. Electronic Signature(s) Signed: 09/20/2021 4:43:58 PM By: Worthy Keeler PA-C Entered By: Worthy Keeler on 09/20/2021 16:43:58 Heslop, [name]  ([id number]) -------------------------------------------------------------------------------- Physician Orders Details Patient Name: Jenna [name] Date of Service: 09/20/2021 3:30 PM Medical Record Number: [id number] Patient Account Number: [id number] Date of Birth/Sex: 04-07-1931 (87 y.o. F) Treating RN: Dolan Amen Primary Care Provider: [name], Dibas Other Clinician: Referring Provider: [name], Dibas Treating Provider/Extender: Skipper Cliche in Treatment: 4 Verbal / Phone Orders: No Diagnosis Coding ICD-10 Coding Code Description I87.2 Venous insufficiency (chronic) (peripheral) I89.0 Lymphedema, not elsewhere classified L97.821 Non-pressure chronic ulcer of other part of left lower leg limited to breakdown of skin E11.622 Type 2 diabetes mellitus with other skin ulcer I10 Essential (primary) hypertension Follow-up Appointments o Return Appointment in 1 week. o Nurse Visit as needed Bathing/ Shower/ Hygiene o May shower with wound dressing protected with water repellent cover or cast protector. Edema Control - Lymphedema / Segmental Compressive Device / Other Left Lower Extremity o Optional: One layer of unna paste to top of compression wrap (to act as an anchor). o 3 Layer Compression System for Lymphedema. - Apply ABD pad o Elevate, Exercise Daily and Avoid Standing for Long Periods of Time. o Elevate legs to the level of the heart and pump ankles as often as possible o Elevate leg(s) parallel to the floor  when sitting. Additional Orders / Instructions o Follow Nutritious Diet and Increase Protein Intake o Activity as tolerated Electronic Signature(s) Signed: 09/20/2021 4:36:36 PM By: Dolan Amen RN Signed: 09/20/2021 4:54:51 PM By: Worthy Keeler PA-C Entered By: Dolan Amen on 09/20/2021 16:19:05 Tarisa, Paola [name] ([id number]) -------------------------------------------------------------------------------- Problem List  Details Patient Name: Jenna [name] Date of Service: 09/20/2021 3:30 PM Medical Record Number: [id number] Patient Account Number: [id number] Date of Birth/Sex: 01-Feb-1931 (87 y.o. F) Treating RN: Dolan Amen Primary Care Provider: [name], Dibas Other Clinician: Referring Provider: [name], Dibas Treating Provider/Extender: Skipper Cliche in Treatment: 4 Active Problems ICD-10 Encounter Code Description Active Date MDM Diagnosis I87.2 Venous insufficiency (chronic) (peripheral) 08/22/2021 No Yes I89.0 Lymphedema, not elsewhere classified 08/22/2021 No Yes L97.821 Non-pressure chronic ulcer of other part of left lower leg limited to 08/22/2021 No Yes breakdown of skin E11.622 Type 2 diabetes mellitus with other skin ulcer 08/22/2021 No Yes I10 Essential (primary) hypertension 08/22/2021 No Yes Inactive Problems Resolved Problems Electronic Signature(s) Signed: 09/20/2021 4:00:22 PM By: Worthy Keeler PA-C Entered By: Worthy Keeler on 09/20/2021 16:00:22 Jenna [name] ([id number]) -------------------------------------------------------------------------------- Progress Note Details Patient Name: Jenna [name] Date of Service: 09/20/2021 3:30 PM Medical Record Number: [id number] Patient Account Number: [id number] Date of Birth/Sex: 06-03-1931 (87 y.o. F) Treating RN: Dolan Amen Primary Care Provider: Lujean Amel Other Clinician: Referring Provider: [name], Dibas Treating Provider/Extender: Skipper Cliche in Treatment: 4 Subjective Chief Complaint Information obtained from Patient Left LE Ulcer History of Present Illness (HPI) The following HPI elements were documented for the patient's wound: Location: left lower extremity in the medial ankle region Quality: Patient reports experiencing a dull pain to affected area(s). Severity: Patient states wound are getting worse. Duration: Patient has had the wound for > 3 months prior to seeking treatment at the  wound center Timing: Pain in wound is Intermittent (comes and goes Context: The wound appeared gradually over time Modifying Factors: Other treatment(s) tried include:[name] Associated Signs and Symptoms: Patient reports having increase swelling. 87 year old patient with a past medical history significant for venous stasis ulceration and diabetes mellitus was recently admitted to the hospital between 07/07/2016 and 07/09/2016. She was wound to have a nonpurulent cellulitis of the left lower extremity and was started on IV antibiotics which included vancomycin. He is discharged home with her and Unna's boots and oral doxycycline. During this admission there was no obvious DVT involving the left lower extremity. her past medical history significant for diabetes mellitus, hypertension, hyperlipidemia, varicose veins, status post right rotator cuff repair, knee surgery on the left,robotic abdominal hysterectomy, laparoscopic cholecystectomy. She is not a smoker. Venous study done on 07/18/2015 showed normal left extremity deep venous system with no evidence of DVT. There was extensive valvular incompetence and reflux throughout the left greater saphenous vein with direct the medication to subcutaneous varicose veins. Normal left small saphenous vein. I understand she was seen by vascular radiology group and the workup and recommendation was for endovenous ablation but due to family pressure she was not able to keep her appointment a year ago. She has not been wearing her compression stockings regularly. 05/26/18 READMISSION this is an 87 year old woman who was previously seen in this clinic in 2017 by Dr. Con Memos. She has chronic venous insufficiency with lymphedema and at that time had open area on the left medial calf and ankle area. Was recommended that she wear 20-30 mm compression stockings and the patient  tells me that she has been compliant  with this although I think her primary doctor recently told her that she didn't need to wear stockings out of fear it might cause arterial compression. She noticed edema and pain in the area a week to 2 ago. She saw her primary doctor and was given doxycycline and Silvadene cream to put on the area. She states things are a lot better. The patient has a history of type 2 diabetes on oral agents but she is unaware of her hemoglobin A1c for her blood glucose which she doesn't check. She has hypertension, varicose veins, rotator cuff repair, knee surgery on the left, history of a laparoscopic cholecystectomy, lymphedema, obstructive sleep apnea, history of endometrial CA. The patient has been to see vein and vascular in the past and I think has had an ablation of the left greater saphenous vein based on ultrasounds of the left leg IC dating back to 2017. Her most recent ultrasound was in May 2018 which showed no evidence of a DVT and continued durable closure of the treated segment of the left greater saphenous vein. Patent lateral accessory branch of the greater saphenous vein extending to the calf ABIs in our clinic were 1.05 on the right and 0.95 on the left 06/02/18 the patient's wound on the left medial lower calf is completely closed and epithelialized. She has new 20-30 mm below-knee stockings READMISSION 11/02/2019 This is a now 87 year old woman who has been in this clinic 2 times before. Most recently in 2019. She has chronic venous insufficiency with some degree of secondary lymphedema. She has had a history of a left greater saphenous vein ablation. When she is here in 2019 she had a wound on the left medial lower calf. This closed fairly easily. It was recommended she wear 20/30 mm below-knee stockings she is not compliant with this. She arrives in clinic with a 2-week history of a left medial lower extremity and ankle wound. Some of this has dry slough on it but most of it is already  epithelialize she has been using a combination of Vaseline and/or Silvadene. She is not wearing any compression. She has a history of chronic venous reflux. There was recommendations in the past for ablations I do not know that she ever carried through with this. According to notes from 2016 this work-up was done via the interventional radiology group. We will need to research this. She is probably going to need a consultation ABIs in our clinic were 1.03 on the right and 0.93 on the left 11/11; patient's wound looks as though it is epithelializing horizontal wound on the left medial lower extremity. She has a superior satellite lesion. Jenna [name], Jenna [name] ([id number]) She is complaining of a lot of pain in 3 layer compression. There have been recommendations for previous ablations I am not sure if she followed up on this. She has been noncompliant with stockings although she brought those into the clinic today. 11/18; the patient had a repeat venous reflux studies at vein and vascular. This did not show any DVT in the left lower extremity there was no evidence of chronic venous insufficiency and no evidence of superficial vein thrombosis. I looked back at previous studies done I think in interventional radiology in 2018 would suggest that there had been previous ablation of a segment of the left greater saphenous vein. By review of the new study I do not think the patient needs to see vein and vascular however I find these studies  increasingly difficult to interpret. I am not sure that the patient has actually consistently worn compression stockings and I think that is the next step here. The wound is just about closed 11/22/2019 upon evaluation today patient actually appears to be healed based on what I am seeing today. She has been tolerating the dressing changes without complication. Fortunately there is no signs of active infection at this time. Overall I feel like even though this is the first  time of seeing her that she has actually done extremely well with the current wound care measures again she has been under the care of Dr. Dellia Nims. 12/2 the patient was expected to be healed this week however she comes in with 3 small wounds that look much the same as the pictures from 2 weeks ago. These are all in the area of the left medial lower extremity. She was put into her own compression stockings last week I do not think the edema control was adequate in this area for healing. We have been using Hydrofera Blue 12/9; wound is fully epithelialized but still looks vulnerable on the left medial lower extremity. Surrounding venous inflammation. I think she has lymphedema with fibrosed skin in the distal lower leg. [Inverted bottle sign]. She has had venous reflux studies that have not shown any of the superficial veins to be amenable to ablations. This is been repeated during this visit. I am not really convinced that she has been wearing compression stockings although she certainly has them. 12/23; patient's wound on the left medial lower extremity is totally healed. She has surrounding venous inflammation and skin damage related to there is also some degree of lymphedema and fibrosed skin in the distal lower extremity. She has her compression stocking Readmission: 03/25/2021 upon evaluation today patient appears to be doing excellent in regard to her wound all things considered. She does appear to have gotten very quickly this time which is great news compared to before when she tells me she let the wound get somewhat out of control. Nonetheless right now she tells me this has been present for about 3 weeks she has been using Vaseline on it. She does have a history of diabetes though she has not taken Metformin for years she sees her primary care provider on Friday and she will see what he says but she is hoping he would not put her back on this. Subsequently she also does have Lasix that she is  not been taking it recently. She has a past medical history significant for venous stasis, lymphedema, diabetes mellitus type 2, and hypertension. 04/01/2021 on evaluation today patient appears to be doing well with regard to her wound. There is no signs of active infection at this time. No fevers, chills, nausea, vomiting, or diarrhea. 04/08/2021 upon evaluation today patient appears to be doing excellent in regard to her wounds currently. She has been tolerating the dressing changes and in fact appears to be completely healed which is great news. Readmission: 08/22/2021 patient presents today for reevaluation here in the clinic concerning a reopening of the wound on the left medial ankle/lower leg region which is the same area I took care of earlier in the year between March and April. Fortunately there does not appear to be anything to do a peer which is good news. I do think that as before this can probably heal fatherly rapidly. Nonetheless I do believe that based on what we are seeing she probably needs a compression wrap at this time. Her  medical history really is not changed. Her ABI back in March of this year was 1.28 on this left leg and was doing well. 08/29/2021 upon evaluation today patient appears to be doing well. With regard to her wound she is actually showing signs of good granulation. I am very pleased in that regard. There does not appear to be any evidence of active infection at this time. 09/06/2021 upon evaluation today patient's wound actually showing signs of doing very well as far as granulation epithelization is concerned. Fortunately I do not see any need for sharp debridement today and very pleased in that regard. There does seem to be some issue here with still continued drainage but nonetheless I think that the compression wrap is doing a very good job in helping to mitigate this as well. Overall I think that we are headed in an appropriate direction towards getting this  completely closed. 09/13/2021 upon evaluation today patient appears to be doing better in regard to the overall measurement today. In fact the measurement really does not speak to as much as this is healed due to the fact that some of the areas that are open are somewhat scattered. In general I am extremely pleased with where we stand today with healing. The patient likewise is also extremely happy with what she is seeing and hopefully will get this closed pretty soon. 09/20/2021 upon evaluation today patient appears to be doing well at this point with regard to her wound. She has been tolerating the dressing changes without complication. Fortunately there does not appear to be any signs of active infection at this time. In fact I feel like this is probably completely healed. Objective Constitutional Well-nourished and well-hydrated in no acute distress. Vitals Time Taken: 3:47 PM, Height: 60 in, Weight: 212 lbs, BMI: 41.4, Temperature: 97.9 F, Pulse: 78 bpm, Respiratory Rate: 18 breaths/min, Blood Pressure: 165/88 mmHg. Jenna [name], Jenna [name] ([id number]) Respiratory normal breathing without difficulty. Psychiatric this patient is able to make decisions and demonstrates good insight into disease process. Alert and Oriented x 3. pleasant and cooperative. General Notes: Upon inspection I am definitely seeing evidence of improved overall healing and in fact I think she has new skin covering the entirety of the wound bed. Fortunately there does not appear to be any signs of active infection which is great news and overall very pleased I do think that she is doing well with compression wrap edema this is healed I think it would be worthwhile for [place] to continue with the wrap 1 more week to allow the wound and newly healed skin to toughen up. Integumentary (Hair, Skin) Wound #5 status is Healed - Epithelialized. Original cause of wound was Gradually Appeared. The date acquired was: 08/15/2021. The wound  has been in treatment 4 weeks. The wound is located on the Left,Distal,Medial Lower Leg. The wound measures 0cm length x 0cm width x 0cm depth; 0cm^2 area and 0cm^3 volume. There is no tunneling or undermining noted. There is a none present amount of drainage noted. The wound margin is flat and intact. There is no granulation within the wound bed. There is no necrotic tissue within the wound bed. Assessment Active Problems ICD-10 Venous insufficiency (chronic) (peripheral) Lymphedema, not elsewhere classified Non-pressure chronic ulcer of other part of left lower leg limited to breakdown of skin Type 2 diabetes mellitus with other skin ulcer Essential (primary) hypertension Procedures There was a Three Layer Compression Therapy Procedure with a pre-treatment ABI of 1.3 by Dolan Amen, RN. Post  procedure Diagnosis Wound #: Same as Pre-Procedure Plan Follow-up Appointments: Return Appointment in 1 week. Nurse Visit as needed Bathing/ Shower/ Hygiene: May shower with wound dressing protected with water repellent cover or cast protector. Edema Control - Lymphedema / Segmental Compressive Device / Other: Optional: One layer of unna paste to top of compression wrap (to act as an anchor). 3 Layer Compression System for Lymphedema. - Apply ABD pad Elevate, Exercise Daily and Avoid Standing for Long Periods of Time. Elevate legs to the level of the heart and pump ankles as often as possible Elevate leg(s) parallel to the floor when sitting. Additional Orders / Instructions: Follow Nutritious Diet and Increase Protein Intake Activity as tolerated 1. I am good recommend currently that we going to continue with the wound care measures as before and the patient is in agreement with plan. This includes the use of the compression wrap which I think has done well for her. This is a 3 layer compression wrap. 2. We just can use an ABD pad cover I do not think there is any specific medication  otherwise as needed. We will see patient back for reevaluation in 1 week here in the clinic. If anything worsens or changes patient will contact our office for additional recommendations. Assuming she remains healed next week she will be discharged using her compression stocking. SHARMEKA, PALMISANO ([id number]) Electronic Signature(s) Signed: 09/20/2021 4:45:03 PM By: Worthy Keeler PA-C Entered By: Worthy Keeler on 09/20/2021 16:45:02 Eyleen, Rawlinson [name] ([id number]) -------------------------------------------------------------------------------- SuperBill Details Patient Name: Jenna [name] Date of Service: 09/20/2021 Medical Record Number: [id number] Patient Account Number: [id number] Date of Birth/Sex: 1931-02-17 (87 y.o. F) Treating RN: Dolan Amen Primary Care Provider: [name], Dibas Other Clinician: Referring Provider: [name], Dibas Treating Provider/Extender: Skipper Cliche in Treatment: 4 Diagnosis Coding ICD-10 Codes Code Description I87.2 Venous insufficiency (chronic) (peripheral) I89.0 Lymphedema, not elsewhere classified L97.821 Non-pressure chronic ulcer of other part of left lower leg limited to breakdown of skin E11.622 Type 2 diabetes mellitus with other skin ulcer I10 Essential (primary) hypertension Facility Procedures CPT4 Code: 62703500 Description: (Facility Use Only) [phone] - [place] LWR LT LEG Modifier: Quantity: 1 Physician Procedures CPT4 Code: 9371696 Description: 78938 - WC PHYS LEVEL 3 - EST PT Modifier: Quantity: 1 CPT4 Code: Description: ICD-10 Diagnosis Description I87.2 Venous insufficiency (chronic) (peripheral) I89.0 Lymphedema, not elsewhere classified L97.821 Non-pressure chronic ulcer of other part of left lower leg limited to bre E11.622 Type 2 diabetes mellitus with  other skin ulcer Modifier: akdown of skin Quantity: Electronic Signature(s) Signed: 09/20/2021 4:45:50 PM By: Worthy Keeler PA-C Previous  Signature: 09/20/2021 4:36:36 PM Version By: Dolan Amen RN Entered By: Worthy Keeler on 09/20/2021 16:45:50

## 2021-09-24 ENCOUNTER — Encounter: Payer: Medicare Other | Admitting: Physical Therapy

## 2021-09-24 DIAGNOSIS — H25811 Combined forms of age-related cataract, right eye: Secondary | ICD-10-CM | POA: Diagnosis not present

## 2021-09-24 DIAGNOSIS — H59032 Cystoid macular edema following cataract surgery, left eye: Secondary | ICD-10-CM | POA: Diagnosis not present

## 2021-09-24 DIAGNOSIS — H401112 Primary open-angle glaucoma, right eye, moderate stage: Secondary | ICD-10-CM | POA: Diagnosis not present

## 2021-09-24 DIAGNOSIS — H401123 Primary open-angle glaucoma, left eye, severe stage: Secondary | ICD-10-CM | POA: Diagnosis not present

## 2021-09-24 DIAGNOSIS — H43813 Vitreous degeneration, bilateral: Secondary | ICD-10-CM | POA: Diagnosis not present

## 2021-09-26 ENCOUNTER — Other Ambulatory Visit: Payer: Self-pay

## 2021-09-26 ENCOUNTER — Ambulatory Visit: Payer: Medicare Other | Admitting: Physical Therapy

## 2021-09-26 DIAGNOSIS — M25511 Pain in right shoulder: Secondary | ICD-10-CM | POA: Diagnosis not present

## 2021-09-26 DIAGNOSIS — M6281 Muscle weakness (generalized): Secondary | ICD-10-CM

## 2021-09-26 DIAGNOSIS — R293 Abnormal posture: Secondary | ICD-10-CM | POA: Diagnosis not present

## 2021-09-26 DIAGNOSIS — M542 Cervicalgia: Secondary | ICD-10-CM | POA: Diagnosis not present

## 2021-09-26 NOTE — Therapy (Signed)
[facility] [address], [place], [place], [address] Phone: [phone]   Fax:  [phone]  Physical Therapy Treatment  Patient Details  Name: Jenna [name] MRN: [id number] Date of Birth: 1931/02/07 Referring Provider (PT): Dibas [name], MD   Encounter Date: 09/26/2021   PT End of Session - 09/26/21 1210     Visit Number 4    Date for PT Re-Evaluation 10/17/21    Authorization Type Medicare A and B    Authorization Time Period 09/05/21 to 10/17/21    Progress Note Due on Visit 10    PT Start Time 1155   pt's arrival time   PT Stop Time 1226    PT Time Calculation (min) 31 min    Activity Tolerance Patient tolerated treatment well;No increased pain    Behavior During Therapy WFL for tasks assessed/performed             Past Medical History:  Diagnosis Date   Arthritis    Cancer ([place])    endometrial cancer   Cellulitis of left lower extremity    Diabetes mellitus    Hyperlipemia    Hypertension    Non-healing ulcer of lower leg (HCC)    Left leg     OAB (overactive bladder)    Obesity    BMI 45    Past Surgical History:  Procedure Laterality Date   ABDOMINAL HYSTERECTOMY  02/2008   robotic hyst BSO Bilateral LND   EYE SURGERY     cataract   IR GENERIC HISTORICAL  09/30/2016   IR EMBO VENOUS NOT HEMORR HEMANG  INC GUIDE ROADMAPPING [facility] ULTRASOUND   JOINT REPLACEMENT     knee   KNEE SURGERY     left knee   LAPAROSCOPIC CHOLECYSTECTOMY SINGLE PORT N/A 07/04/2014   Procedure: LAPAROSCOPIC CHOLECYSTECTOMY SINGLE PORT;  Surgeon: Adin Hector, MD;  Location: [facility];  Service: General;  Laterality: N/A;   ROTATOR CUFF REPAIR     right arm   TUBAL LIGATION      There were no vitals filed for this visit.   Subjective Assessment - 09/26/21 1156     Subjective Pt reports shoulder is feeling better.    Pertinent History HTN, Diabetes, HLD    Diagnostic tests none recent    Patient Stated  Goals decrease pain in the neck/shoulder    Currently in Pain? No/denies                               [facility] Adult PT Treatment/Exercise - 09/26/21 0001       Shoulder Exercises: Seated   Horizontal ABduction Strengthening;Both;20 reps    Theraband Level (Shoulder Horizontal ABduction) Level 1 (Yellow)    Horizontal ABduction Weight (lbs) 2x10    Flexion Strengthening;Both;20 reps    Theraband Level (Shoulder Flexion) Level 1 (Yellow)    Flexion Weight (lbs) 2x10    Diagonals --    Theraband Level (Shoulder Diagonals) --    Diagonals Weight (lbs) --    Other Seated Exercises dowel +2lb ankle weight chest press x10    Other Seated Exercises dowel +2lb flexion 2x 10      Shoulder Exercises: Standing   Extension Strengthening;Right;Theraband;20 reps    Theraband Level (Shoulder Extension) Level 1 (Yellow)    Extension Limitations 2x10    Row Strengthening;Both;20 reps;Theraband    Theraband Level (Shoulder Row) Level 1 (Yellow)  Row Weight (lbs) 2x10      Shoulder Exercises: ROM/Strengthening   UBE (Upper Arm Bike) 3 mins forward, 3 backward 1.2 forward and 1 backward    Ranger seated flexion and circles x 10 each at 90 deg    Other ROM/Strengthening Exercises finger ladder 3 rounds                     PT Education - 09/26/21 1210     Education Details Access Code: [email or web address], educated on proper technique thorughout all exercises    Person(s) Educated Patient    Methods Explanation;Demonstration;Tactile cues;Verbal cues    Comprehension Verbalized understanding;Returned demonstration              PT Short Term Goals - 09/05/21 1149       PT SHORT TERM GOAL #1   Title Pt will be independent with her initial HEP to improve ROM and strength.    Time 3    Period Weeks    Status New               PT Long Term Goals - 09/05/21 1150       PT LONG TERM GOAL #1   Title Pt will have atleast 45 deg of active cervical rotation.     Time 6    Period Weeks    Status New      PT LONG TERM GOAL #2   Title Pt will have atleast 4/5MMT strength of the Rt shoulder to improve her ability to reach overhead and complete other daily activity without assistance.    Time 6    Period Weeks    Status New      PT LONG TERM GOAL #3   Title Pt will report atleast 50% improvement in her Rt shoulder/neck pain from the start of PT/    Time 6    Period Weeks    Status New      PT LONG TERM GOAL #4   Title Pt will be able to reach overhead into a cabinet x10 reps without the need for assistance or increase in shoulder pain.    Time 6    Period Weeks    Status New      PT LONG TERM GOAL #5   Title Pt will have improved scapular control evident by her ability to sit through as session without the need for PT cuing to decrease Rt shoulder shrug.    Time 6    Period Weeks    Status New                   Plan - 09/26/21 1211     Clinical Impression Statement Pt reports improved function with reaching overhead and getting items needed without pain which she wasn't able to do prior to therapy and continues to have no pain. Pt session focused on shoulder strengthening and mobility with pt tolerating well throughout with cues. Pt would benefit from continued PT to address remaining deficits of mobility, strength and function.    Personal Factors and Comorbidities Age;Fitness;Comorbidity 1    Comorbidities history of Rt RTC repair    Examination-Activity Limitations Bed Mobility;Lift;Reach Overhead;Sleep    Examination-Participation Restrictions Meal Prep;Cleaning;Laundry    Stability/Clinical Decision Making Evolving/Moderate complexity    Clinical Decision Making Moderate    Rehab Potential Good    PT Frequency 2x / week    PT Duration 6 weeks    PT Treatment/Interventions ADLs/Self Care Home  Management;Cryotherapy;Moist Heat;Electrical Stimulation;Neuromuscular re-education;Therapeutic exercise;Therapeutic  activities;Patient/family education;Manual techniques;Dry needling;Taping    PT Next Visit Plan continue AA/ROM and A/ROM, strengthening for scapular stab and RC, functional ROM, manual therapy for optimizing mobility of Rt shoulder girdle    PT Home Exercise Plan [email or web address]    Consulted and Agree with Plan of Care Patient             Patient will benefit from skilled therapeutic intervention in order to improve the following deficits and impairments:  Pain, Improper body mechanics, Postural dysfunction, Increased muscle spasms, Decreased coordination, Decreased activity tolerance, Decreased range of motion, Decreased strength, Impaired UE functional use, Impaired flexibility  Visit Diagnosis: Muscle weakness (generalized)  Abnormal posture     Problem List Patient Active Problem List   Diagnosis Date Noted   OSA (obstructive sleep apnea) 02/08/2017   Murmur, cardiac 12/31/2016   Morbid obesity ([place]) 12/31/2016   Cellulitis of left leg 07/07/2016   Venous stasis ulcer of left lower extremity ([place]) 07/07/2016   Chronic cholecystitis with calculus s/p lap chole 07/04/2014 07/03/2014   HTN (hypertension) 05/31/2012   Non-insulin dependent type 2 diabetes mellitus ([place]) 05/31/2012   Hypercholesteremia 05/31/2012   OAB (overactive bladder)    Malignant neoplasm of corpus uteri, except isthmus ([place]) 12/10/2011    Class: Stage 1   Stacy Gardner, PT, DPT 09/27/2211:27 PM   [facility] [address] [place], [place], [address] Phone: [phone]   Fax:  [phone]  Name: Jenna [name] MRN: [id number] Date of Birth: 1931/12/26

## 2021-09-27 ENCOUNTER — Ambulatory Visit: Payer: Medicare Other | Admitting: Physician Assistant

## 2021-09-30 ENCOUNTER — Other Ambulatory Visit: Payer: Self-pay

## 2021-09-30 ENCOUNTER — Encounter: Payer: Medicare Other | Attending: Physician Assistant | Admitting: Physician Assistant

## 2021-09-30 DIAGNOSIS — I872 Venous insufficiency (chronic) (peripheral): Secondary | ICD-10-CM | POA: Insufficient documentation

## 2021-09-30 DIAGNOSIS — I10 Essential (primary) hypertension: Secondary | ICD-10-CM | POA: Diagnosis not present

## 2021-09-30 DIAGNOSIS — L97821 Non-pressure chronic ulcer of other part of left lower leg limited to breakdown of skin: Secondary | ICD-10-CM | POA: Diagnosis not present

## 2021-09-30 DIAGNOSIS — E11622 Type 2 diabetes mellitus with other skin ulcer: Secondary | ICD-10-CM | POA: Insufficient documentation

## 2021-09-30 DIAGNOSIS — I890 Lymphedema, not elsewhere classified: Secondary | ICD-10-CM | POA: Insufficient documentation

## 2021-10-02 ENCOUNTER — Other Ambulatory Visit: Payer: Self-pay

## 2021-10-02 ENCOUNTER — Encounter: Payer: Self-pay | Admitting: Physical Therapy

## 2021-10-02 ENCOUNTER — Ambulatory Visit: Payer: Medicare Other | Attending: Family Medicine | Admitting: Physical Therapy

## 2021-10-02 DIAGNOSIS — M542 Cervicalgia: Secondary | ICD-10-CM | POA: Diagnosis not present

## 2021-10-02 DIAGNOSIS — M6281 Muscle weakness (generalized): Secondary | ICD-10-CM | POA: Diagnosis not present

## 2021-10-02 DIAGNOSIS — M25511 Pain in right shoulder: Secondary | ICD-10-CM | POA: Diagnosis not present

## 2021-10-02 DIAGNOSIS — R293 Abnormal posture: Secondary | ICD-10-CM | POA: Insufficient documentation

## 2021-10-02 NOTE — Therapy (Signed)
[place] @ [place], [place], [address] Phone:     Fax:     Physical Therapy Treatment  Patient Details  Name: RASHAN PATIENT MRN: [id number] Date of Birth: Aug 28, 1931 Referring Provider (PT): Dibas [name], MD   Encounter Date: 10/02/2021   PT End of Session - 10/02/21 1117     Visit Number 5    Date for PT Re-Evaluation 10/17/21    Authorization Type Medicare A and B    Authorization Time Period 09/05/21 to 10/17/21    Progress Note Due on Visit 10    PT Start Time 1113   Pt late   PT Stop Time 1139    PT Time Calculation (min) 26 min    Activity Tolerance Patient tolerated treatment well;No increased pain    Behavior During Therapy WFL for tasks assessed/performed             Past Medical History:  Diagnosis Date   Arthritis    Cancer ([place])    endometrial cancer   Cellulitis of left lower extremity    Diabetes mellitus    Hyperlipemia    Hypertension    Non-healing ulcer of lower leg (HCC)    Left leg     OAB (overactive bladder)    Obesity    BMI 45    Past Surgical History:  Procedure Laterality Date   ABDOMINAL HYSTERECTOMY  02/2008   robotic hyst BSO Bilateral LND   EYE SURGERY     cataract   IR GENERIC HISTORICAL  09/30/2016   IR EMBO VENOUS NOT HEMORR HEMANG  INC GUIDE ROADMAPPING [facility] ULTRASOUND   JOINT REPLACEMENT     knee   KNEE SURGERY     left knee   LAPAROSCOPIC CHOLECYSTECTOMY SINGLE PORT N/A 07/04/2014   Procedure: LAPAROSCOPIC CHOLECYSTECTOMY SINGLE PORT;  Surgeon: Adin Hector, MD;  Location: [facility];  Service: General;  Laterality: N/A;   ROTATOR CUFF REPAIR     right arm   TUBAL LIGATION      There were no vitals filed for this visit.   Subjective Assessment - 10/02/21 1116     Subjective My shoulder is 100% better.  I am using it fully, sleeping well, no pain.    Pertinent History HTN, Diabetes, HLD    Diagnostic tests none recent    Patient Stated  Goals decrease pain in the neck/shoulder    Currently in Pain? No/denies    Pain Score 0-No pain                [facility] PT Assessment - 10/02/21 0001       Assessment   Medical Diagnosis Cervicalgia    Referring Provider (PT) Dibas Koirala, MD    Onset Date/Surgical Date --   3-4 weeks ago   Hand Dominance Right    Next MD Visit none that she's aware      Observation/Other Assessments   Focus on Therapeutic Outcomes (FOTO)  86%      AROM   AROM Assessment Site Shoulder;Cervical    Right/Left Shoulder Right    Right Shoulder Flexion 165 Degrees    Right Shoulder ABduction 165 Degrees    Cervical - Right Rotation 70    Cervical - Left Rotation 70      Strength   Overall Strength Comments Rt shoulder at least 4+/5 no pain on testing  [facility] Adult PT Treatment/Exercise - 10/02/21 0001       Shoulder Exercises: Seated   Extension Strengthening;Both;15 reps;Theraband    Theraband Level (Shoulder Extension) Level 1 (Yellow)    Row Strengthening;Both;20 reps;Theraband    Theraband Level (Shoulder Row) Level 1 (Yellow)    Horizontal ABduction Strengthening;Both;20 reps;Theraband    Theraband Level (Shoulder Horizontal ABduction) Level 1 (Yellow)    External Rotation Strengthening;Both;20 reps;Theraband    Theraband Level (Shoulder External Rotation) Level 1 (Yellow)    Flexion AROM    Flexion Limitations Rt cone transfer to overhead shelf x 10 reps      Shoulder Exercises: ROM/Strengthening   UBE (Upper Arm Bike) L1.2 2x2 while reviewing goals and discussing status                       PT Short Term Goals - 10/02/21 1117       PT SHORT TERM GOAL #1   Title Pt will be independent with her initial HEP to improve ROM and strength.    Status Achieved               PT Long Term Goals - 10/02/21 1117       PT LONG TERM GOAL #1   Title Pt will have atleast 45 deg of active cervical rotation.    Baseline 70  deg bil    Status Achieved      PT LONG TERM GOAL #2   Title Pt will have atleast 4/5MMT strength of the Rt shoulder to improve her ability to reach overhead and complete other daily activity without assistance.    Baseline 4+/5 Rt shoulder, no pain on testing    Status Achieved      PT LONG TERM GOAL #3   Title Pt will report atleast 50% improvement in her Rt shoulder/neck pain from the start of PT/    Baseline 100% improved    Status Achieved      PT LONG TERM GOAL #4   Title Pt will be able to reach overhead into a cabinet x10 reps without the need for assistance or increase in shoulder pain.    Baseline -    Status Achieved      PT LONG TERM GOAL #5   Title Pt will have improved scapular control evident by her ability to sit through as session without the need for PT cuing to decrease Rt shoulder shrug.    Baseline normal mechanics    Status Achieved                   Plan - 10/02/21 1140     Clinical Impression Statement Pt has consistently reported painfree Rt shoulder and return of full use of Rt UE for several visits.  She demos 70 deg bil neck rotation (35 deg bil at eval), WFL and symmetrical ROM of Rt UE compared to Lt without pain, and improved FOTO score to 86%.  Pt is sleeping well without pain.  She expressed readiness for d/c and has met all goals.  PT reviewed seated HEP for shoulder strength with good performance by Pt without need for cueing on form.  D/C to HEP.    Comorbidities history of Rt RTC repair    Rehab Potential Good    PT Frequency 2x / week    PT Duration 6 weeks    PT Treatment/Interventions ADLs/Self Care Home Management;Cryotherapy;Moist Heat;Electrical Stimulation;Neuromuscular re-education;Therapeutic exercise;Therapeutic activities;Patient/family education;Manual techniques;Dry needling;Taping  PT Next Visit Plan d/c to HEP, met all goals and is painfree    PT Home Exercise Plan [email or web address]    Consulted and Agree with Plan of Care  Patient             Patient will benefit from skilled therapeutic intervention in order to improve the following deficits and impairments:     Visit Diagnosis: Muscle weakness (generalized)  Abnormal posture  Right shoulder pain, unspecified chronicity  Cervicalgia     Problem List Patient Active Problem List   Diagnosis Date Noted   OSA (obstructive sleep apnea) 02/08/2017   Murmur, cardiac 12/31/2016   Morbid obesity ([place]) 12/31/2016   Cellulitis of left leg 07/07/2016   Venous stasis ulcer of left lower extremity ([place]) 07/07/2016   Chronic cholecystitis with calculus s/p lap chole 07/04/2014 07/03/2014   HTN (hypertension) 05/31/2012   Non-insulin dependent type 2 diabetes mellitus ([place]) 05/31/2012   Hypercholesteremia 05/31/2012   OAB (overactive bladder)    Malignant neoplasm of corpus uteri, except isthmus ([place]) 12/10/2011    Class: Stage 1    PHYSICAL THERAPY DISCHARGE SUMMARY  Visits from Start of Care: 5  Current functional level related to goals / functional outcomes: See above   Remaining deficits: See above   Education / Equipment: HEP  Patient agrees to discharge. Patient goals were met. Patient is being discharged due to meeting the stated rehab goals.  Baruch Merl, PT 10/02/21 11:43 AM  [place] @ [place], [place], [address] Phone:     Fax:     Name: DACODA SPALLONE MRN: [id number] Date of Birth: October 22, 1931

## 2021-10-03 ENCOUNTER — Encounter: Payer: Medicare Other | Admitting: Physical Therapy

## 2021-10-04 ENCOUNTER — Encounter: Payer: Medicare Other | Admitting: Physical Therapy

## 2021-10-04 NOTE — Progress Notes (Signed)
[name], [name] ([id number]) Visit Report for 09/30/2021 Chief Complaint Document Details Patient Name: Jenna [name], Jenna [name]. Date of Service: 09/30/2021 3:45 PM Medical Record Number: [id number] Patient Account Number: [id number] Date of Birth/Sex: 1931-10-09 (87 y.o. F) Treating RN: Carlene Coria Primary Care Provider: Lujean Amel Other Clinician: Referring Provider: [name], Dibas Treating Provider/Extender: Skipper Cliche in Treatment: 5 Information Obtained from: Patient Chief Complaint Left LE Ulcer Electronic Signature(s) Signed: 09/30/2021 4:38:40 PM By: Worthy Keeler PA-C Entered By: Worthy Keeler on 09/30/2021 16:38:39 Jenna [name] ([id number]) -------------------------------------------------------------------------------- HPI Details Patient Name: Jenna [name] Date of Service: 09/30/2021 3:45 PM Medical Record Number: [id number] Patient Account Number: [id number] Date of Birth/Sex: Dec 01, 1931 (87 y.o. F) Treating RN: Carlene Coria Primary Care Provider: [name], Dibas Other Clinician: Referring Provider: [name], Dibas Treating Provider/Extender: Skipper Cliche in Treatment: 5 History of Present Illness Location: left lower extremity in the medial ankle region Quality: Patient reports experiencing a dull pain to affected area(s). Severity: Patient states wound are getting worse. Duration: Patient has had the wound for > 3 months prior to seeking treatment at the wound center Timing: Pain in wound is Intermittent (comes and goes Context: The wound appeared gradually over time Modifying Factors: Other treatment(s) tried include:[name] Associated Signs and Symptoms: Patient reports having increase swelling. HPI Description: 87 year old patient with a past medical history significant for venous stasis ulceration and diabetes mellitus was recently admitted to the hospital between 07/07/2016 and  07/09/2016. She was wound to have a nonpurulent cellulitis of the left lower extremity and was started on IV antibiotics which included vancomycin. He is discharged home with her and Unna's boots and oral doxycycline. During this admission there was no obvious DVT involving the left lower extremity. her past medical history significant for diabetes mellitus, hypertension, hyperlipidemia, varicose veins, status post right rotator cuff repair, knee surgery on the left,robotic abdominal hysterectomy, laparoscopic cholecystectomy. She is not a smoker. Venous study done on 07/18/2015 showed normal left extremity deep venous system with no evidence of DVT. There was extensive valvular incompetence and reflux throughout the left greater saphenous vein with direct the medication to subcutaneous varicose veins. Normal left small saphenous vein. I understand she was seen by vascular radiology group and the workup and recommendation was for endovenous ablation but due to family pressure she was not able to keep her appointment a year ago. She has not been wearing her compression stockings regularly. 05/26/18 READMISSION this is an 87 year old woman who was previously seen in this clinic in 2017 by Dr. Con Memos. She has chronic venous insufficiency with lymphedema and at that time had open area on the left medial calf and ankle area. Was recommended that she wear 20-30 mm compression stockings and the patient tells me that she has been compliant with this although I think her primary doctor recently told her that she didn't need to wear stockings out of fear it might cause arterial compression. She noticed edema and pain in the area a week to 2 ago. She saw her primary doctor and was given doxycycline and Silvadene cream to put on the area. She states things are a lot better. The patient has a history of type 2 diabetes on oral agents but she is unaware of her hemoglobin A1c for her blood glucose which she  doesn't check. She has hypertension, varicose veins, rotator cuff repair, knee surgery on the left, history of a laparoscopic cholecystectomy, lymphedema, obstructive sleep apnea, history  of endometrial CA. The patient has been to see vein and vascular in the past and I think has had an ablation of the left greater saphenous vein based on ultrasounds of the left leg IC dating back to 2017. Her most recent ultrasound was in May 2018 which showed no evidence of a DVT and continued durable closure of the treated segment of the left greater saphenous vein. Patent lateral accessory branch of the greater saphenous vein extending to the calf ABIs in our clinic were 1.05 on the right and 0.95 on the left 06/02/18 the patient's wound on the left medial lower calf is completely closed and epithelialized. She has new 20-30 mm below-knee stockings READMISSION 11/02/2019 This is a now 87 year old woman who has been in this clinic 2 times before. Most recently in 2019. She has chronic venous insufficiency with some degree of secondary lymphedema. She has had a history of a left greater saphenous vein ablation. When she is here in 2019 she had a wound on the left medial lower calf. This closed fairly easily. It was recommended she wear 20/30 mm below-knee stockings she is not compliant with this. She arrives in clinic with a 2-week history of a left medial lower extremity and ankle wound. Some of this has dry slough on it but most of it is already epithelialize she has been using a combination of Vaseline and/or Silvadene. She is not wearing any compression. She has a history of chronic venous reflux. There was recommendations in the past for ablations I do not know that she ever carried through with this. According to notes from 2016 this work-up was done via the interventional radiology group. We will need to research this. She is probably going to need a consultation ABIs in our clinic were 1.03 on the right  and 0.93 on the left 11/11; patient's wound looks as though it is epithelializing horizontal wound on the left medial lower extremity. She has a superior satellite lesion. She is complaining of a lot of pain in 3 layer compression. There have been recommendations for previous ablations I am not sure if she followed up on this. She has been noncompliant with stockings although she brought those into the clinic today. 11/18; the patient had a repeat venous reflux studies at vein and vascular. This did not show any DVT in the left lower extremity there was no evidence of chronic venous insufficiency and no evidence of superficial vein thrombosis. I looked back at previous studies done I think in interventional radiology in 2018 would suggest that there had been previous ablation of a segment of the left greater saphenous vein. By review of the new study I do not think the patient needs to see vein and vascular however I find these studies increasingly difficult to interpret. I am not sure that the patient has actually consistently worn compression stockings and I think that is the next step here. The wound is just about closed ODEAL, Jenna [name] ([id number]) 11/22/2019 upon evaluation today patient actually appears to be healed based on what I am seeing today. She has been tolerating the dressing changes without complication. Fortunately there is no signs of active infection at this time. Overall I feel like even though this is the first time of seeing her that she has actually done extremely well with the current wound care measures again she has been under the care of Dr. Dellia Nims. 12/2 the patient was expected to be healed this week however she comes in with 3  small wounds that look much the same as the pictures from 2 weeks ago. These are all in the area of the left medial lower extremity. She was put into her own compression stockings last week I do not think the edema control was adequate in this area  for healing. We have been using Hydrofera Blue 12/9; wound is fully epithelialized but still looks vulnerable on the left medial lower extremity. Surrounding venous inflammation. I think she has lymphedema with fibrosed skin in the distal lower leg. [Inverted bottle sign]. She has had venous reflux studies that have not shown any of the superficial veins to be amenable to ablations. This is been repeated during this visit. I am not really convinced that she has been wearing compression stockings although she certainly has them. 12/23; patient's wound on the left medial lower extremity is totally healed. She has surrounding venous inflammation and skin damage related to there is also some degree of lymphedema and fibrosed skin in the distal lower extremity. She has her compression stocking Readmission: 03/25/2021 upon evaluation today patient appears to be doing excellent in regard to her wound all things considered. She does appear to have gotten very quickly this time which is great news compared to before when she tells me she let the wound get somewhat out of control. Nonetheless right now she tells me this has been present for about 3 weeks she has been using Vaseline on it. She does have a history of diabetes though she has not taken Metformin for years she sees her primary care provider on Friday and she will see what he says but she is hoping he would not put her back on this. Subsequently she also does have Lasix that she is not been taking it recently. She has a past medical history significant for venous stasis, lymphedema, diabetes mellitus type 2, and hypertension. 04/01/2021 on evaluation today patient appears to be doing well with regard to her wound. There is no signs of active infection at this time. No fevers, chills, nausea, vomiting, or diarrhea. 04/08/2021 upon evaluation today patient appears to be doing excellent in regard to her wounds currently. She has been tolerating the  dressing changes and in fact appears to be completely healed which is great news. Readmission: 08/22/2021 patient presents today for reevaluation here in the clinic concerning a reopening of the wound on the left medial ankle/lower leg region which is the same area I took care of earlier in the year between March and April. Fortunately there does not appear to be anything to do a peer which is good news. I do think that as before this can probably heal fatherly rapidly. Nonetheless I do believe that based on what we are seeing she probably needs a compression wrap at this time. Her medical history really is not changed. Her ABI back in March of this year was 1.28 on this left leg and was doing well. 08/29/2021 upon evaluation today patient appears to be doing well. With regard to her wound she is actually showing signs of good granulation. I am very pleased in that regard. There does not appear to be any evidence of active infection at this time. 09/06/2021 upon evaluation today patient's wound actually showing signs of doing very well as far as granulation epithelization is concerned. Fortunately I do not see any need for sharp debridement today and very pleased in that regard. There does seem to be some issue here with still continued drainage but nonetheless I think that  the compression wrap is doing a very good job in helping to mitigate this as well. Overall I think that we are headed in an appropriate direction towards getting this completely closed. 09/13/2021 upon evaluation today patient appears to be doing better in regard to the overall measurement today. In fact the measurement really does not speak to as much as this is healed due to the fact that some of the areas that are open are somewhat scattered. In general I am extremely pleased with where we stand today with healing. The patient likewise is also extremely happy with what she is seeing and hopefully will get this closed pretty  soon. 09/20/2021 upon evaluation today patient appears to be doing well at this point with regard to her wound. She has been tolerating the dressing changes without complication. Fortunately there does not appear to be any signs of active infection at this time. In fact I feel like this is probably completely healed. 09/30/2021 upon evaluation today patient actually appears to be doing quite well in regard to her wound. She has been tolerating the dressing changes without complication. Fortunately there does not appear to be any signs of active infection which is great news. No fevers, chills, nausea, vomiting, or diarrhea. Electronic Signature(s) Signed: 09/30/2021 4:40:06 PM By: Worthy Keeler PA-C Entered By: Worthy Keeler on 09/30/2021 16:40:06 Hasel, Jenna [name] ([id number]) -------------------------------------------------------------------------------- Physical Exam Details Patient Name: Jenna [name] Date of Service: 09/30/2021 3:45 PM Medical Record Number: [id number] Patient Account Number: [id number] Date of Birth/Sex: [birth year] (87 y.o. F) Treating RN: Carlene Coria Primary Care Provider: [name], Dibas Other Clinician: Referring Provider: [name], Dibas Treating Provider/Extender: Skipper Cliche in Treatment: 5 Constitutional Well-nourished and well-hydrated in no acute distress. Respiratory normal breathing without difficulty. Psychiatric this patient is able to make decisions and demonstrates good insight into disease process. Alert and Oriented x 3. pleasant and cooperative. Notes Upon inspection patient's wound showed signs of complete epithelization which is great and overall I am extremely pleased with where we stand at this point I think she is definitely ready for discharge which is great news as well. Electronic Signature(s) Signed: 09/30/2021 4:40:20 PM By: Worthy Keeler PA-C Entered By: Worthy Keeler on 09/30/2021 16:40:20 Makenzee, Choudhry Jenna [name]  ([id number]) -------------------------------------------------------------------------------- Physician Orders Details Patient Name: Jenna [name] Date of Service: 09/30/2021 3:45 PM Medical Record Number: [id number] Patient Account Number: [id number] Date of Birth/Sex: [birth year] (87 y.o. F) Treating RN: Carlene Coria Primary Care Provider: [name], Dibas Other Clinician: Referring Provider: [name], Dibas Treating Provider/Extender: Skipper Cliche in Treatment: 5 Verbal / Phone Orders: No Diagnosis Coding Discharge From [facility] o Discharge from [place] [facility] Complete o Wear compression garments daily. Put garments on first thing when you wake up and remove them before bed. o Moisturize legs daily after removing compression garments. Electronic Signature(s) Signed: 09/30/2021 5:28:09 PM By: Worthy Keeler PA-C Signed: 10/04/2021 12:57:29 PM By: Carlene Coria RN Entered By: Carlene Coria on 09/30/2021 16:15:39 Aaisha, Sliter Jenna [name] ([id number]) -------------------------------------------------------------------------------- Problem List Details Patient Name: Jenna [name], Jenna [name]. Date of Service: 09/30/2021 3:45 PM Medical Record Number: [id number] Patient Account Number: [id number] Date of Birth/Sex: 1931/02/07 (87 y.o. F) Treating RN: Carlene Coria Primary Care Provider: [name], Dibas Other Clinician: Referring Provider: [name], Dibas Treating Provider/Extender: Skipper Cliche in Treatment: 5 Active Problems ICD-10 Encounter Code Description Active Date MDM Diagnosis I87.2 Venous insufficiency (chronic) (peripheral) 08/22/2021 No Yes I89.0 Lymphedema, not elsewhere classified 08/22/2021 No Yes L97.821 Non-pressure  chronic ulcer of other part of left lower leg limited to 08/22/2021 No Yes breakdown of skin E11.622 Type 2 diabetes mellitus with other skin ulcer 08/22/2021 No Yes I10 Essential (primary) hypertension 08/22/2021 No Yes Inactive  Problems Resolved Problems Electronic Signature(s) Signed: 09/30/2021 4:38:33 PM By: Worthy Keeler PA-C Entered By: Worthy Keeler on 09/30/2021 16:38:33 Hajduk, Jenna [name] ([id number]) -------------------------------------------------------------------------------- Progress Note Details Patient Name: Jenna [name] Date of Service: 09/30/2021 3:45 PM Medical Record Number: [id number] Patient Account Number: [id number] Date of Birth/Sex: 05/07/1931 (87 y.o. F) Treating RN: Carlene Coria Primary Care Provider: [name], Dibas Other Clinician: Referring Provider: [name], Dibas Treating Provider/Extender: Skipper Cliche in Treatment: 5 Subjective Chief Complaint Information obtained from Patient Left LE Ulcer History of Present Illness (HPI) The following HPI elements were documented for the patient's wound: Location: left lower extremity in the medial ankle region Quality: Patient reports experiencing a dull pain to affected area(s). Severity: Patient states wound are getting worse. Duration: Patient has had the wound for > 3 months prior to seeking treatment at the wound center Timing: Pain in wound is Intermittent (comes and goes Context: The wound appeared gradually over time Modifying Factors: Other treatment(s) tried include:[name] Associated Signs and Symptoms: Patient reports having increase swelling. 87 year old patient with a past medical history significant for venous stasis ulceration and diabetes mellitus was recently admitted to the hospital between 07/07/2016 and 07/09/2016. She was wound to have a nonpurulent cellulitis of the left lower extremity and was started on IV antibiotics which included vancomycin. He is discharged home with her and Unna's boots and oral doxycycline. During this admission there was no obvious DVT involving the left lower extremity. her past medical history significant for diabetes  mellitus, hypertension, hyperlipidemia, varicose veins, status post right rotator cuff repair, knee surgery on the left,robotic abdominal hysterectomy, laparoscopic cholecystectomy. She is not a smoker. Venous study done on 07/18/2015 showed normal left extremity deep venous system with no evidence of DVT. There was extensive valvular incompetence and reflux throughout the left greater saphenous vein with direct the medication to subcutaneous varicose veins. Normal left small saphenous vein. I understand she was seen by vascular radiology group and the workup and recommendation was for endovenous ablation but due to family pressure she was not able to keep her appointment a year ago. She has not been wearing her compression stockings regularly. 05/26/18 READMISSION this is an 87 year old woman who was previously seen in this clinic in 2017 by Dr. Con Memos. She has chronic venous insufficiency with lymphedema and at that time had open area on the left medial calf and ankle area. Was recommended that she wear 20-30 mm compression stockings and the patient tells me that she has been compliant with this although I think her primary doctor recently told her that she didn't need to wear stockings out of fear it might cause arterial compression. She noticed edema and pain in the area a week to 2 ago. She saw her primary doctor and was given doxycycline and Silvadene cream to put on the area. She states things are a lot better. The patient has a history of type 2 diabetes on oral agents but she is unaware of her hemoglobin A1c for her blood glucose which she doesn't check. She has hypertension, varicose veins, rotator cuff repair, knee surgery on the left, history of a laparoscopic cholecystectomy, lymphedema, obstructive sleep apnea, history of endometrial CA. The patient has been to see vein  and vascular in the past and I think has had an ablation of the left greater saphenous vein based on ultrasounds of  the left leg IC dating back to 2017. Her most recent ultrasound was in May 2018 which showed no evidence of a DVT and continued durable closure of the treated segment of the left greater saphenous vein. Patent lateral accessory branch of the greater saphenous vein extending to the calf ABIs in our clinic were 1.05 on the right and 0.95 on the left 06/02/18 the patient's wound on the left medial lower calf is completely closed and epithelialized. She has new 20-30 mm below-knee stockings READMISSION 11/02/2019 This is a now 87 year old woman who has been in this clinic 2 times before. Most recently in 2019. She has chronic venous insufficiency with some degree of secondary lymphedema. She has had a history of a left greater saphenous vein ablation. When she is here in 2019 she had a wound on the left medial lower calf. This closed fairly easily. It was recommended she wear 20/30 mm below-knee stockings she is not compliant with this. She arrives in clinic with a 2-week history of a left medial lower extremity and ankle wound. Some of this has dry slough on it but most of it is already epithelialize she has been using a combination of Vaseline and/or Silvadene. She is not wearing any compression. She has a history of chronic venous reflux. There was recommendations in the past for ablations I do not know that she ever carried through with this. According to notes from 2016 this work-up was done via the interventional radiology group. We will need to research this. She is probably going to need a consultation ABIs in our clinic were 1.03 on the right and 0.93 on the left 11/11; patient's wound looks as though it is epithelializing horizontal wound on the left medial lower extremity. She has a superior satellite lesion. DENIM, START ([id number]) She is complaining of a lot of pain in 3 layer compression. There have been recommendations for previous ablations I am not sure if she followed up on  this. She has been noncompliant with stockings although she brought those into the clinic today. 11/18; the patient had a repeat venous reflux studies at vein and vascular. This did not show any DVT in the left lower extremity there was no evidence of chronic venous insufficiency and no evidence of superficial vein thrombosis. I looked back at previous studies done I think in interventional radiology in 2018 would suggest that there had been previous ablation of a segment of the left greater saphenous vein. By review of the new study I do not think the patient needs to see vein and vascular however I find these studies increasingly difficult to interpret. I am not sure that the patient has actually consistently worn compression stockings and I think that is the next step here. The wound is just about closed 11/22/2019 upon evaluation today patient actually appears to be healed based on what I am seeing today. She has been tolerating the dressing changes without complication. Fortunately there is no signs of active infection at this time. Overall I feel like even though this is the first time of seeing her that she has actually done extremely well with the current wound care measures again she has been under the care of Dr. Dellia Nims. 12/2 the patient was expected to be healed this week however she comes in with 3 small wounds that look much the same as the  pictures from 2 weeks ago. These are all in the area of the left medial lower extremity. She was put into her own compression stockings last week I do not think the edema control was adequate in this area for healing. We have been using Hydrofera Blue 12/9; wound is fully epithelialized but still looks vulnerable on the left medial lower extremity. Surrounding venous inflammation. I think she has lymphedema with fibrosed skin in the distal lower leg. [Inverted bottle sign]. She has had venous reflux studies that have not shown any of the superficial  veins to be amenable to ablations. This is been repeated during this visit. I am not really convinced that she has been wearing compression stockings although she certainly has them. 12/23; patient's wound on the left medial lower extremity is totally healed. She has surrounding venous inflammation and skin damage related to there is also some degree of lymphedema and fibrosed skin in the distal lower extremity. She has her compression stocking Readmission: 03/25/2021 upon evaluation today patient appears to be doing excellent in regard to her wound all things considered. She does appear to have gotten very quickly this time which is great news compared to before when she tells me she let the wound get somewhat out of control. Nonetheless right now she tells me this has been present for about 3 weeks she has been using Vaseline on it. She does have a history of diabetes though she has not taken Metformin for years she sees her primary care provider on Friday and she will see what he says but she is hoping he would not put her back on this. Subsequently she also does have Lasix that she is not been taking it recently. She has a past medical history significant for venous stasis, lymphedema, diabetes mellitus type 2, and hypertension. 04/01/2021 on evaluation today patient appears to be doing well with regard to her wound. There is no signs of active infection at this time. No fevers, chills, nausea, vomiting, or diarrhea. 04/08/2021 upon evaluation today patient appears to be doing excellent in regard to her wounds currently. She has been tolerating the dressing changes and in fact appears to be completely healed which is great news. Readmission: 08/22/2021 patient presents today for reevaluation here in the clinic concerning a reopening of the wound on the left medial ankle/lower leg region which is the same area I took care of earlier in the year between March and April. Fortunately there does not  appear to be anything to do a peer which is good news. I do think that as before this can probably heal fatherly rapidly. Nonetheless I do believe that based on what we are seeing she probably needs a compression wrap at this time. Her medical history really is not changed. Her ABI back in March of this year was 1.28 on this left leg and was doing well. 08/29/2021 upon evaluation today patient appears to be doing well. With regard to her wound she is actually showing signs of good granulation. I am very pleased in that regard. There does not appear to be any evidence of active infection at this time. 09/06/2021 upon evaluation today patient's wound actually showing signs of doing very well as far as granulation epithelization is concerned. Fortunately I do not see any need for sharp debridement today and very pleased in that regard. There does seem to be some issue here with still continued drainage but nonetheless I think that the compression wrap is doing a very good job  in helping to mitigate this as well. Overall I think that we are headed in an appropriate direction towards getting this completely closed. 09/13/2021 upon evaluation today patient appears to be doing better in regard to the overall measurement today. In fact the measurement really does not speak to as much as this is healed due to the fact that some of the areas that are open are somewhat scattered. In general I am extremely pleased with where we stand today with healing. The patient likewise is also extremely happy with what she is seeing and hopefully will get this closed pretty soon. 09/20/2021 upon evaluation today patient appears to be doing well at this point with regard to her wound. She has been tolerating the dressing changes without complication. Fortunately there does not appear to be any signs of active infection at this time. In fact I feel like this is probably completely healed. 09/30/2021 upon evaluation today patient  actually appears to be doing quite well in regard to her wound. She has been tolerating the dressing changes without complication. Fortunately there does not appear to be any signs of active infection which is great news. No fevers, chills, nausea, vomiting, or diarrhea. Objective Constitutional Well-nourished and well-hydrated in no acute distress. Vitals Time Taken: 4:04 PM, Height: 60 in, Weight: 212 lbs, BMI: 41.4, Temperature: 98.3 F, Pulse: 88 bpm, Respiratory Rate: 18 breaths/min, Firman, Jenna S. ([id number]) Blood Pressure: 178/84 mmHg. Respiratory normal breathing without difficulty. Psychiatric this patient is able to make decisions and demonstrates good insight into disease process. Alert and Oriented x 3. pleasant and cooperative. General Notes: Upon inspection patient's wound showed signs of complete epithelization which is great and overall I am extremely pleased with where we stand at this point I think she is definitely ready for discharge which is great news as well. Assessment Active Problems ICD-10 Venous insufficiency (chronic) (peripheral) Lymphedema, not elsewhere classified Non-pressure chronic ulcer of other part of left lower leg limited to breakdown of skin Type 2 diabetes mellitus with other skin ulcer Essential (primary) hypertension Plan Discharge From [facility] [employer] [facility]: Discharge from [facility] Complete Wear compression garments daily. Put garments on first thing when you wake up and remove them before bed. Moisturize legs daily after removing compression garments. 1. Would recommend that we continue with the wound care measures for the patient is in agreement with that plan. This includes the use of the compression she does have a compression stocking currently she will be doing this at home. 2. I am also can recommend that we have the patient continue with the protective dressing for the next 2 weeks over the wound and then subsequently  she can discontinue following. We will see the patient back for follow-up visit as needed. Electronic Signature(s) Signed: 09/30/2021 4:41:37 PM By: Worthy Keeler PA-C Previous Signature: 09/30/2021 4:41:25 PM Version By: Worthy Keeler PA-C Entered By: Worthy Keeler on 09/30/2021 16:41:37 Coombs, Jenna [name] ([id number]) -------------------------------------------------------------------------------- SuperBill Details Patient Name: Jenna [name] Date of Service: 09/30/2021 Medical Record Number: [id number] Patient Account Number: [id number] Date of Birth/Sex: [birth year] (87 y.o. F) Treating RN: Carlene Coria Primary Care Provider: [name], [place] Other Clinician: Referring Provider: [name], [place] Treating Provider/Extender: Skipper Cliche in Treatment: 5 Diagnosis Coding ICD-10 Codes Code Description I87.2 Venous insufficiency (chronic) (peripheral) I89.0 Lymphedema, not elsewhere classified L97.821 Non-pressure chronic ulcer of other part of left lower leg limited to breakdown of skin E11.622 Type 2 diabetes mellitus with other skin ulcer I10 Essential (  primary) hypertension Facility Procedures CPT4 Code: 60630160 Description: [phone] - WOUND CARE VISIT-LEV 2 EST PT Modifier: Quantity: 1 Physician Procedures CPT4 Code: 3557322 Description: 02542 - WC PHYS LEVEL 3 - EST PT Modifier: Quantity: 1 CPT4 Code: Description: ICD-10 Diagnosis Description I87.2 Venous insufficiency (chronic) (peripheral) I89.0 Lymphedema, not elsewhere classified L97.821 Non-pressure chronic ulcer of other part of left lower leg limited to bre E11.622 Type 2 diabetes mellitus with  other skin ulcer Modifier: akdown of skin Quantity: Electronic Signature(s) Signed: 09/30/2021 4:41:56 PM By: Worthy Keeler PA-C Entered By: Worthy Keeler on 09/30/2021 16:41:56

## 2021-10-04 NOTE — Progress Notes (Signed)
[id number] Patient Account Number: [id number] Date of Birth/Sex: [birth year] (87 y.o. F) Treating RN: Carlene Coria Primary Care Dnaiel Voller: Lujean Amel Other Clinician: Referring Emerick Weatherly: [name], [place] Treating Fidela Cieslak/Extender: Skipper Cliche in Treatment: 5 Active Inactive Electronic Signature(s) Signed: 10/04/2021 12:57:29 PM By: Carlene Coria RN Entered By: Carlene Coria on 09/30/2021 16:14:57 Jenna [name] ([id number]) -------------------------------------------------------------------------------- Pain Assessment Details Patient Name: Jenna [name] Date of Service: 09/30/2021 3:45 PM Medical Record Number: [id number] Patient Account Number: [id number] Date of Birth/Sex: [birth year] (87 y.o. F) Treating RN: Carlene Coria Primary Care Dalinda Heidt: [name], [place] Other Clinician: Referring Danis Pembleton: [name], [place]ibas Treating Vian Fluegel/Extender: Skipper Cliche in Treatment: 5 Active Problems Location of Pain Severity and Description of Pain Patient Has Paino No Site Locations Pain Management and Medication Current Pain Management: Electronic Signature(s) Signed: 10/04/2021 12:57:29 PM By: Carlene Coria RN Entered By: Carlene Coria on 09/30/2021 16:07:09 Jenna [name]  ([id number]) -------------------------------------------------------------------------------- Patient/Caregiver Education Details Patient Name: Jenna [name] Date of Service: 09/30/2021 3:45 PM Medical Record Number: [id number] Patient Account Number: [id number] Date of Birth/Gender: [birth year] (87 y.o. F) Treating RN: Carlene Coria Primary Care Physician: [name], [place]ib[place] Other Clinician: Referring Physician: [name], [place] Treating Physician/Extender: Skipper Cliche in Treatment: 5 Education Assessment Education Provided To: Patient Education Topics Provided Wound/Skin Impairment: Methods: Explain/Verbal Responses: State content correctly Electronic Signature(s) Signed: 10/04/2021 12:57:29 PM By: Carlene Coria RN Entered By: Carlene Coria on 09/30/2021 16:20:27 Jenna [name] ([id number]) -------------------------------------------------------------------------------- [place] Details Patient Name: Jenna [name] Date of Service: 09/30/2021 3:45 PM Medical Record Number: [id number] Patient Account Number: [id number] Date of Birth/Sex: 1931/05/26 (87 y.o. F) Treating RN: Carlene Coria Primary Care Jaquelinne Glendening: [name], [place]ibas Other Clinician: Referring Cass Edinger: [name], [place] Treating Obediah Welles/Extender: Skipper Cliche in Treatment: 5 Vital Signs Time Taken: 16:04 Temperature (F): 98.3 Height (in): 60 Pulse (bpm): 88 Weight (lbs): 212 Respiratory Rate (breaths/min): 18 Body Mass Index (BMI): 41.4 Blood Pressure (mmHg): 178/84 Reference Range: 80 - 120 mg / dl Electronic Signature(s) Signed: 10/04/2021 12:57:29 PM By: Carlene Coria RN Entered By: Carlene Coria on 09/30/2021 16:06:38  [id number] Patient Account Number: [id number] Date of Birth/Sex: [birth year] (87 y.o. F) Treating RN: Carlene Coria Primary Care Dnaiel Voller: Lujean Amel Other Clinician: Referring Emerick Weatherly: [name], [place] Treating Fidela Cieslak/Extender: Skipper Cliche in Treatment: 5 Active Inactive Electronic Signature(s) Signed: 10/04/2021 12:57:29 PM By: Carlene Coria RN Entered By: Carlene Coria on 09/30/2021 16:14:57 Jenna [name] ([id number]) -------------------------------------------------------------------------------- Pain Assessment Details Patient Name: Jenna [name] Date of Service: 09/30/2021 3:45 PM Medical Record Number: [id number] Patient Account Number: [id number] Date of Birth/Sex: [birth year] (87 y.o. F) Treating RN: Carlene Coria Primary Care Dalinda Heidt: [name], [place]ibas Other Clinician: Referring Danis Pembleton: [name], [place]ibas Treating Vian Fluegel/Extender: Skipper Cliche in Treatment: 5 Active Problems Location of Pain Severity and Description of Pain Patient Has Paino No Site Locations Pain Management and Medication Current Pain Management: Electronic Signature(s) Signed: 10/04/2021 12:57:29 PM By: Carlene Coria RN Entered By: Carlene Coria on 09/30/2021 16:07:09 Jenna [name]  ([id number]) -------------------------------------------------------------------------------- Patient/Caregiver Education Details Patient Name: Jenna [name] Date of Service: 09/30/2021 3:45 PM Medical Record Number: [id number] Patient Account Number: [id number] Date of Birth/Gender: [birth year] (87 y.o. F) Treating RN: Carlene Coria Primary Care Physician: [name], [place]ibas Other Clinician: Referring Physician: [name], [place] Treating Physician/Extender: Skipper Cliche in Treatment: 5 Education Assessment Education Provided To: Patient Education Topics Provided Wound/Skin Impairment: Methods: Explain/Verbal Responses: State content correctly Electronic Signature(s) Signed: 10/04/2021 12:57:29 PM By: Carlene Coria RN Entered By: Carlene Coria on 09/30/2021 16:20:27 Jenna [name] ([id number]) -------------------------------------------------------------------------------- [place] Details Patient Name: Jenna [name] Date of Service: 09/30/2021 3:45 PM Medical Record Number: [id number] Patient Account Number: [id number] Date of Birth/Sex: 1931/05/26 (87 y.o. F) Treating RN: Carlene Coria Primary Care Jaquelinne Glendening: [name], [place]ibas Other Clinician: Referring Cass Edinger: [name], [place] Treating Obediah Welles/Extender: Skipper Cliche in Treatment: 5 Vital Signs Time Taken: 16:04 Temperature (F): 98.3 Height (in): 60 Pulse (bpm): 88 Weight (lbs): 212 Respiratory Rate (breaths/min): 18 Body Mass Index (BMI): 41.4 Blood Pressure (mmHg): 178/84 Reference Range: 80 - 120 mg / dl Electronic Signature(s) Signed: 10/04/2021 12:57:29 PM By: Carlene Coria RN Entered By: Carlene Coria on 09/30/2021 16:06:38  Jenna [name], Jenna [name] ([id number]) Visit Report for 09/30/2021 Arrival Information Details Patient Name: Jenna [name], Jenna [name]. Date of Service: 09/30/2021 3:45 PM Medical Record Number: [id number] Patient Account Number: [id number] Date of Birth/Sex: [birth year] (87 y.o. F) Treating RN: Carlene Coria Primary Care Bernice Mullin: [name], [place]ibas Other Clinician: Referring Lee-Ann Gal: [name], [place] Treating Kiyona Mcnall/Extender: Skipper Cliche in Treatment: 5 Visit Information History Since Last Visit All ordered tests and consults were completed: No Patient Arrived: Jenna [name] Added or deleted any medications: No Arrival Time: 16:02 Any new allergies or adverse reactions: No Accompanied By: self Had a fall or experienced change in No Transfer Assistance: None activities of daily living that may affect Patient Identification Verified: Yes risk of falls: Secondary Verification Process Completed: Yes Signs or symptoms of abuse/neglect since last visito No Patient Requires Transmission-Based No Hospitalized since last visit: No Precautions: Implantable device outside of the clinic excluding No Patient Has Alerts: Yes cellular tissue based products placed in the center Patient Alerts: Patient on Blood since last visit: Thinner Has Dressing in Place as Prescribed: Yes aspirin 325 Has Compression in Place as Prescribed: Yes Type II Diabetic ABI 03/25/2021 L)1.28 Pain Present Now: No Electronic Signature(s) Signed: 10/04/2021 12:57:29 PM By: Carlene Coria RN Entered By: Carlene Coria on 09/30/2021 16:06:15 Jenna [name] ([id number]) -------------------------------------------------------------------------------- Clinic Level of Care Assessment Details Patient Name: Jenna [name] Date of Service: 09/30/2021 3:45 PM Medical Record Number: [id number] Patient Account Number: [id number] Date of Birth/Sex: Feb 26, 1931 (87 y.o. F) Treating RN: Carlene Coria Primary Care Milliana Reddoch: [name], [place]ibas Other  Clinician: Referring Jodine Muchmore: [name], [place] Treating Jaida Basurto/Extender: Skipper Cliche in Treatment: 5 Clinic Level of Care Assessment Items TOOL 4 Quantity Score X - Use when only an EandM is performed on FOLLOW-UP visit 1 0 ASSESSMENTS - Nursing Assessment / Reassessment X - Reassessment of Co-morbidities (includes updates in patient status) 1 10 X- 1 5 Reassessment of Adherence to Treatment Plan ASSESSMENTS - Wound and Skin Assessment / Reassessment X - Simple Wound Assessment / Reassessment - one wound 1 5 []  - 0 Complex Wound Assessment / Reassessment - multiple wounds []  - 0 Dermatologic / Skin Assessment (not related to wound area) ASSESSMENTS - Focused Assessment []  - Circumferential Edema Measurements - multi extremities 0 []  - 0 Nutritional Assessment / Counseling / Intervention []  - 0 Lower Extremity Assessment (monofilament, tuning fork, pulses) []  - 0 Peripheral Arterial Disease Assessment (using hand held doppler) ASSESSMENTS - Ostomy and/or Continence Assessment and Care []  - Incontinence Assessment and Management 0 []  - 0 Ostomy Care Assessment and Management (repouching, etc.) PROCESS - Coordination of Care X - Simple Patient / Family Education for ongoing care 1 15 []  - 0 Complex (extensive) Patient / Family Education for ongoing care []  - 0 Staff obtains Programmer, systems, Records, Test Results / Process Orders []  - 0 Staff telephones HHA, Nursing Homes / Clarify orders / etc []  - 0 Routine Transfer to another Facility (non-emergent condition) []  - 0 Routine Hospital Admission (non-emergent condition) []  - 0 New Admissions / Biomedical engineer / Ordering NPWT, Apligraf, etc. []  - 0 Emergency Hospital Admission (emergent condition) X- 1 10 Simple Discharge Coordination []  - 0 Complex (extensive) Discharge Coordination PROCESS - Special Needs []  - Pediatric / Minor Patient Management 0 []  - 0 Isolation Patient Management []  - 0 Hearing /  Language / Visual special needs []  - 0 Assessment of Community assistance (transportation, D/C planning, etc.) []  - 0 Additional assistance / Altered mentation []  - 0 Support Surface(s) Assessment (

## 2021-10-08 ENCOUNTER — Encounter: Payer: Medicare Other | Admitting: Physical Therapy

## 2021-10-10 ENCOUNTER — Ambulatory Visit: Payer: Medicare Other | Admitting: Physical Therapy

## 2021-10-30 DIAGNOSIS — Z23 Encounter for immunization: Secondary | ICD-10-CM | POA: Diagnosis not present

## 2021-11-01 DIAGNOSIS — Z20822 Contact with and (suspected) exposure to covid-19: Secondary | ICD-10-CM | POA: Diagnosis not present

## 2021-11-18 DIAGNOSIS — N3091 Cystitis, unspecified with hematuria: Secondary | ICD-10-CM | POA: Diagnosis not present

## 2021-11-18 DIAGNOSIS — R300 Dysuria: Secondary | ICD-10-CM | POA: Diagnosis not present

## 2021-11-26 DIAGNOSIS — L97929 Non-pressure chronic ulcer of unspecified part of left lower leg with unspecified severity: Secondary | ICD-10-CM | POA: Diagnosis not present

## 2021-11-26 DIAGNOSIS — I83029 Varicose veins of left lower extremity with ulcer of unspecified site: Secondary | ICD-10-CM | POA: Diagnosis not present

## 2021-11-26 DIAGNOSIS — E11622 Type 2 diabetes mellitus with other skin ulcer: Secondary | ICD-10-CM | POA: Diagnosis not present

## 2021-11-26 DIAGNOSIS — L03116 Cellulitis of left lower limb: Secondary | ICD-10-CM | POA: Diagnosis not present

## 2021-11-29 DIAGNOSIS — L03116 Cellulitis of left lower limb: Secondary | ICD-10-CM | POA: Diagnosis not present

## 2021-12-06 ENCOUNTER — Other Ambulatory Visit: Payer: Self-pay

## 2021-12-06 ENCOUNTER — Encounter: Payer: Medicare Other | Attending: Physician Assistant | Admitting: Physician Assistant

## 2021-12-06 DIAGNOSIS — L03116 Cellulitis of left lower limb: Secondary | ICD-10-CM | POA: Insufficient documentation

## 2021-12-06 DIAGNOSIS — E11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: Secondary | ICD-10-CM | POA: Diagnosis not present

## 2021-12-06 DIAGNOSIS — E119 Type 2 diabetes mellitus without complications: Secondary | ICD-10-CM | POA: Diagnosis not present

## 2021-12-06 DIAGNOSIS — I890 Lymphedema, not elsewhere classified: Secondary | ICD-10-CM | POA: Insufficient documentation

## 2021-12-06 DIAGNOSIS — E1151 Type 2 diabetes mellitus with diabetic peripheral angiopathy without gangrene: Secondary | ICD-10-CM | POA: Insufficient documentation

## 2021-12-06 DIAGNOSIS — I872 Venous insufficiency (chronic) (peripheral): Secondary | ICD-10-CM | POA: Diagnosis not present

## 2021-12-09 NOTE — Progress Notes (Signed)
ARCHISHA, ESKOLA ([id number]) Visit Report for 12/06/2021 Abuse/Suicide Risk Screen Details Patient Name: Jenna [name], Jenna [name]. Date of Service: 12/06/2021 12:45 PM Medical Record Number: [id number] Patient Account Number: [id number] Date of Birth/Sex: 1931-09-20 (87 y.o. F) Treating RN: Angelina Pih Primary Care Nevaen Tredway: Docia Chuck, Dibas Other Clinician: Huel Coventry Referring Ajwa Kimberley: Docia Chuck, Dibas Treating Huntley Demedeiros/Extender: Rowan Blase in Treatment: 0 Abuse/Suicide Risk Screen Items Answer ABUSE RISK SCREEN: Has anyone close to you tried to hurt or harm you recentlyo No Do you feel uncomfortable with anyone in your familyo No Has anyone forced you do things that you didnot want to doo No Electronic Signature(s) Signed: 12/06/2021 3:17:06 PM By: Elliot Gurney, [name], RN, CWS, [name], [name] Signed: 12/09/2021 11:01:31 AM By: Angelina Pih Entered By: Elliot Gurney, [name], RN, CWS, Kim on 12/06/2021 15:17:06 Jenna [name] ([id number]) -------------------------------------------------------------------------------- Activities of Daily Living Details Patient Name: Jenna, [name]. Date of Service: 12/06/2021 12:45 PM Medical Record Number: [id number] Patient Account Number: [id number] Date of Birth/Sex: 10/27/1931 (87 y.o. F) Treating RN: Angelina Pih Primary Care Teela Narducci: Docia Chuck, Dibas Other Clinician: Huel Coventry Referring Rakeem Colley: Docia Chuck, Dibas Treating Genifer Lazenby/Extender: Rowan Blase in Treatment: 0 Activities of Daily Living Items Answer Activities of Daily Living (Please select one for each item) Drive Automobile Need Assistance Take Medications Completely Able Use Telephone Completely Able Care for Appearance Completely Able Use Toilet Completely Able Bath / Shower Completely Able Dress Self Completely Able Feed Self Completely Able Walk Completely Able Get In / Out Bed Completely Able Housework Completely Able Prepare Meals Completely Able Handle Money Completely  Able Shop for Self Completely Able Electronic Signature(s) Signed: 12/06/2021 3:17:13 PM By: Elliot Gurney, [name], RN, CWS, [name], [name] Signed: 12/09/2021 11:01:31 AM By: Angelina Pih Entered By: Elliot Gurney, [name], RN, CWS, Kim on 12/06/2021 15:17:13 Faviola, Appleman [name] ([id number]) -------------------------------------------------------------------------------- Education Screening Details Patient Name: Jenna [name] Date of Service: 12/06/2021 12:45 PM Medical Record Number: [id number] Patient Account Number: [id number] Date of Birth/Sex: 1931/06/23 (87 y.o. F) Treating RN: Angelina Pih Primary Care Kemaya Dorner: Docia Chuck, Dibas Other Clinician: Huel Coventry Referring Sabrin Dunlevy: Docia Chuck, Dibas Treating Elira Colasanti/Extender: Rowan Blase in Treatment: 0 Learning Preferences/Education Level/Primary Language Learning Preference: Explanation, Demonstration Highest Education Level: High School Preferred Language: English Cognitive Barrier Language Barrier: No Translator Needed: No Memory Deficit: No Emotional Barrier: No Cultural/Religious Beliefs Affecting Medical Care: No Physical Barrier Impaired Vision: Yes Glasses Impaired Hearing: No Decreased Hand dexterity: No Knowledge/Comprehension Knowledge Level: High Comprehension Level: High Ability to understand written instructions: High Ability to understand verbal instructions: High Motivation Anxiety Level: Calm Cooperation: Cooperative Education Importance: Acknowledges Need Interest in Health Problems: Asks Questions Perception: Coherent Willingness to Engage in Self-Management High Activities: Readiness to Engage in Self-Management High Activities: Electronic Signature(s) Signed: 12/06/2021 3:17:20 PM By: Elliot Gurney, [name], RN, CWS, [name], [name] Signed: 12/09/2021 11:01:31 AM By: Angelina Pih Entered By: Elliot Gurney, [name], RN, CWS, Kim on 12/06/2021 15:17:19 Jenna, [name]  ([id number]) -------------------------------------------------------------------------------- Fall Risk Assessment Details Patient Name: Jenna [name] Date of Service: 12/06/2021 12:45 PM Medical Record Number: [id number] Patient Account Number: [id number] Date of Birth/Sex: [birth year] (87 y.o. F) Treating RN: Angelina Pih Primary Care Kenyette Gundy: Docia Chuck, Dibas Other Clinician: Huel Coventry Referring Fateh Kindle: Docia Chuck, Dibas Treating Tiasia Weberg/Extender: Rowan Blase in Treatment: 0 Fall Risk Assessment Items Have you had 2 or more falls in the last 12 monthso 0 No Have you had any fall that resulted in injury in the last 12 monthso 0 No FALLS RISK SCREEN History of falling - immediate  or within 3 months 0 No Secondary diagnosis (Do you have 2 or more medical diagnoseso) 0 No Ambulatory aid None/bed rest/wheelchair/nurse 0 No Crutches/cane/walker 15 Yes Furniture 0 No Intravenous therapy Access/Saline/Heparin Lock 0 No Gait/Transferring Normal/ bed rest/ wheelchair 0 Yes Weak (short steps with or without shuffle, stooped but able to lift head while walking, may 0 No seek support from furniture) Impaired (short steps with shuffle, may have difficulty arising from chair, head down, impaired 0 No balance) Mental Status Oriented to own ability 0 Yes Electronic Signature(s) Signed: 12/06/2021 3:17:25 PM By: Elliot Gurney, [name], RN, CWS, [name], [name] Signed: 12/09/2021 11:01:31 AM By: Angelina Pih Entered By: Elliot Gurney, [name], RN, CWS, Kim on 12/06/2021 15:17:25 Rindy, Reaume [name] ([id number]) -------------------------------------------------------------------------------- Foot Assessment Details Patient Name: Jenna [name] Date of Service: 12/06/2021 12:45 PM Medical Record Number: [id number] Patient Account Number: [id number] Date of Birth/Sex: 24-Oct-1931 (87 y.o. F) Treating RN: Angelina Pih Primary Care Larah Kuntzman: Docia Chuck, Dibas Other Clinician: Huel Coventry Referring Allix Blomquist:  Docia Chuck, Dibas Treating Juwan Vences/Extender: Rowan Blase in Treatment: 0 Foot Assessment Items Site Locations + = Sensation present, - = Sensation absent, C = Callus, U = Ulcer R = Redness, W = Warmth, M = Maceration, PU = Pre-ulcerative lesion F = Fissure, S = Swelling, D = Dryness Assessment Right: Left: Other Deformity: No No Prior Foot Ulcer: No No Prior Amputation: No No Charcot Joint: No No Ambulatory Status: Ambulatory With Help Assistance Device: Cane Gait: Steady Electronic Signature(s) Signed: 12/06/2021 3:17:43 PM By: Elliot Gurney, [name], RN, CWS, [name], [name] Signed: 12/09/2021 11:01:31 AM By: Angelina Pih Entered By: Elliot Gurney, [name], RN, CWS, Kim on 12/06/2021 15:17:43 Mellott, [name] ([id number]) -------------------------------------------------------------------------------- Nutrition Risk Screening Details Patient Name: ENEYDA, BOLINSKI. Date of Service: 12/06/2021 12:45 PM Medical Record Number: [id number] Patient Account Number: [id number] Date of Birth/Sex: December 06, 1931 (87 y.o. F) Treating RN: Angelina Pih Primary Care Zyan Coby: Docia Chuck, Dibas Other Clinician: Huel Coventry Referring Aquan Kope: Docia Chuck, Dibas Treating Emin Foree/Extender: Rowan Blase in Treatment: 0 Height (in): 60 Weight (lbs): 200 Body Mass Index (BMI): 39.1 Nutrition Risk Screening Items Score Screening NUTRITION RISK SCREEN: I have an illness or condition that made me change the kind and/or amount of food I eat 0 No I eat fewer than two meals per day 0 No I eat few fruits and vegetables, or milk products 0 No I have three or more drinks of beer, liquor or wine almost every day 0 No I have tooth or mouth problems that make it hard for me to eat 0 No I don't always have enough money to buy the food I need 0 No I eat alone most of the time 0 No I take three or more different prescribed or over-the-counter drugs a day 0 No Without wanting to, I have lost or gained 10 pounds in the last six  months 0 No I am not always physically able to shop, cook and/or feed myself 0 No Nutrition Protocols Good Risk Protocol 0 No interventions needed Moderate Risk Protocol High Risk Proctocol Risk Level: Good Risk Score: 0 Electronic Signature(s) Signed: 12/06/2021 3:17:31 PM By: Elliot Gurney, [name], RN, CWS, [name], [name] Signed: 12/09/2021 11:01:31 AM By: Angelina Pih Entered By: Elliot Gurney, [name], RN, CWS, Kim on 12/06/2021 15:17:31

## 2021-12-09 NOTE — Progress Notes (Signed)
issue here with still continued drainage but nonetheless I think that the compression wrap is doing a very good job in helping to mitigate this as well. Overall I think that we are headed in an appropriate direction towards getting this completely closed. 09/13/2021 upon evaluation today patient appears to be doing better in regard to the overall measurement today. In fact the measurement really does not speak to as much as this is healed due to the fact that some of the areas that are open are somewhat scattered. In general I am extremely pleased with where we stand today with healing. The patient likewise is also extremely happy with what she  is seeing and hopefully will get this closed pretty soon. 09/20/2021 upon evaluation today patient appears to be doing well at this point with regard to her wound. She has been tolerating the dressing changes without complication. Fortunately there does not appear to be any signs of active infection at this time. In fact I feel like this is probably completely healed. 09/30/2021 upon evaluation today patient actually appears to be doing quite well in regard to her wound. She has been tolerating the dressing changes without complication. Fortunately there does not appear to be any signs of active infection which is great news. No fevers, chills, nausea, vomiting, or diarrhea. Readmission: 12/06/2021 upon evaluation today patient appears to be doing decently well in regard to her left leg. She had previously had an issue here with cellulitis that she did go to an urgent care for and they placed her on doxycycline. The good news is this seems to have gotten much better. She tells me since I last saw her she has been wearing her compression socks and this happened despite that nonetheless. She does not take any fluid pills right now. Fortunately there is no signs of active infection locally nor systemically at this point. Electronic Signature(s) Signed: 12/06/2021 1:29:03 PM By: Worthy Keeler PA-C Entered By: Worthy Keeler on 12/06/2021 13:29:03 Jenna [name], Jenna [name] ([id number]) -------------------------------------------------------------------------------- Physical Exam Details Patient Name: Jenna [name] Date of Service: 12/06/2021 12:45 PM Medical Record Number: [id number] Patient Account Number: [id number] Date of Birth/Sex: [birth year] (87 y.o. F) Treating RN: Cornell Barman Primary Care Provider: [name], Dibas Other Clinician: Referring Provider: [name], Dibas Treating Provider/Extender: Skipper Cliche in Treatment: 0 Constitutional patient is hypertensive.. pulse regular and within  target range for patient.Marland Kitchen respirations regular, non-labored and within target range for patient.Marland Kitchen temperature within target range for patient.. Well-nourished and well-hydrated in no acute distress. Eyes conjunctiva clear no eyelid edema noted. pupils equal round and reactive to light and accommodation. Ears, Nose, Mouth, and Throat no gross abnormality of ear auricles or external auditory canals. normal hearing noted during conversation. mucus membranes moist. Respiratory normal breathing without difficulty. Cardiovascular 2+ dorsalis pedis/posterior tibialis pulses. no clubbing, cyanosis, significant edema, <3 sec cap refill. Musculoskeletal normal gait and posture. no significant deformity or arthritic changes, no loss or range of motion, no clubbing. Psychiatric this patient is able to make decisions and demonstrates good insight into disease process. Alert and Oriented x 3. pleasant and cooperative. Notes Upon inspection patient's wound bed actually showed signs of good granulation and epithelization at this point. In fact I do not see any things currently open that I think need to be addressed. I think the biggest thing she needs is some lotion and to be honest to continue with her compression stockings to wear at nighttime. Electronic Signature(s) Signed: 12/06/2021 1:36:56 PM By: Joaquim Lai  Hereditary Spherocytosis: No; Hypertension: Yes - Maternal Grandparents,Paternal Grandparents,Mother,Father,Siblings; Kidney Disease: No; Lung Disease: Yes - Siblings; Seizures: No; Stroke: No; Thyroid Problems: No; Tuberculosis: No; Never smoker; Marital Status - Married; Alcohol Use: Never; Drug Use: No History; Caffeine Use: Daily - coffee; Financial Concerns: No; Food, Clothing or Shelter Needs: No; Support System Lacking: No; Transportation Concerns: No Engineer, maintenance) Signed: 12/06/2021 4:48:50 PM By: Gretta Cool, [name], RN, CWS, [name], [name] Signed: 12/06/2021 6:06:35 PM By: Worthy Keeler PA-C Signed: 12/09/2021 11:01:31 AM By: Levora Dredge Entered By: Gretta Cool [name], RN, CWS, Kim on 12/06/2021 15:17:00 Jenna [name], Jenna [name] ([id number]) Jenna [name], Jenna [name] ([id number]) -------------------------------------------------------------------------------- SuperBill Details Patient Name: Jenna [name]. Date of Service: 12/06/2021 Medical Record Number: [id number] Patient Account Number: [id number] Date of Birth/Sex: 11-08-1931 (87 y.o. 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 H03 Essential (primary) hypertension Facility Procedures CPT4 Code: 88828003 Description: [phone] - WOUND CARE VISIT-LEV 2 EST PT Modifier: Quantity:  1 Physician Procedures CPT4 Code: [id number] Description: 94801 - WC PHYS LEVEL 3 - EST PT Modifier: Quantity: 1 CPT4 Code: Description: ICD-10 Diagnosis Description L03.116 Cellulitis of left lower limb I87.2 Venous insufficiency (chronic) (peripheral) I89.0 Lymphedema, not elsewhere classified E11.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 Modifier: Quantity: Electronic Signature(s) Signed: 12/06/2021 1:40:53 PM By: Worthy Keeler PA-C Entered By: Worthy Keeler on 12/06/2021 13:40:52  Jenna [name], Jenna [name] ([id number]) Visit Report for 12/06/2021 Chief Complaint Document Details Patient Name: Jenna [name], Jenna [name]. Date of Service: 12/06/2021 12:45 PM Medical Record Number: [id number] Patient Account Number: [id number] Date of Birth/Sex: August 07, 1931 (87 y.o. F) Treating RN: Cornell Barman Primary Care Provider: [name], Dibas Other Clinician: Referring Provider: [name], Dibas Treating Provider/Extender: Skipper Cliche in Treatment: 0 Information Obtained from: Patient Chief Complaint Left LE Cellulitis Electronic Signature(s) Signed: 12/06/2021 1:18:16 PM By: Worthy Keeler PA-C Previous Signature: 12/06/2021 1:18:02 PM Version By: Worthy Keeler PA-C Entered By: Worthy Keeler on 12/06/2021 13:18:15 Jenna [name] ([id number]) -------------------------------------------------------------------------------- HPI Details Patient Name: Jenna [name] Date of Service: 12/06/2021 12:45 PM Medical Record Number: [id number] Patient Account Number: [id number] Date of Birth/Sex: 26-Nov-1931 (87 y.o. F) Treating RN: Cornell Barman Primary Care Provider: [name], Dibas Other Clinician: Referring Provider: [name], Dibas Treating Provider/Extender: Skipper Cliche in Treatment: 0 History of Present Illness Location: left lower extremity in the medial ankle region Quality: Patient reports experiencing a dull pain to affected area(s). Severity: Patient states wound are getting worse. Duration: Patient has had the wound for > 3 months prior to seeking treatment at the wound center Timing: Pain in wound is Intermittent (comes and goes Context: The wound appeared gradually over time Modifying Factors: Other treatment(s) tried include:[name] Associated Signs and Symptoms: Patient reports having increase swelling. HPI Description: 87 year old patient with a past medical history significant for venous stasis ulceration and diabetes  mellitus was recently admitted to the hospital between 07/07/2016 and 07/09/2016. She was wound to have a nonpurulent cellulitis of the left lower extremity and was started on IV antibiotics which included vancomycin. He is discharged home with her and Unna's boots and oral doxycycline. During this admission there was no obvious DVT involving the left lower extremity. her past medical history significant for diabetes mellitus, hypertension, hyperlipidemia, varicose veins, status post right rotator cuff repair, knee surgery on the left,robotic abdominal hysterectomy, laparoscopic cholecystectomy. She is not a smoker. Venous study done on 07/18/2015 showed normal left extremity deep venous system with no evidence of DVT. There was extensive valvular incompetence and reflux throughout the left greater saphenous vein with direct the medication to subcutaneous varicose veins. Normal left small saphenous vein. I understand she was seen by vascular radiology group and the workup and recommendation was for endovenous ablation but due to family pressure she was not able to keep her appointment a year ago. She has not been wearing her compression stockings regularly. 05/26/18 READMISSION this is an 87 year old woman who was previously seen in this clinic in 2017 by Dr. Con Memos. She has chronic venous insufficiency with lymphedema and at that time had open area on the left medial calf and ankle area. Was recommended that she wear 20-30 mm compression stockings and the patient tells me that she has been compliant with this although I think her primary doctor recently told her that she didn't need to wear stockings out of fear it might cause arterial compression. She noticed edema and pain in the area a week to 2 ago. She saw her primary doctor and was given doxycycline and Silvadene cream to put on the area. She states things are a lot better. The patient has a history of type 2 diabetes on oral agents but she is  unaware of her hemoglobin A1c for her blood glucose which she doesn't check. She has hypertension, varicose veins, rotator cuff repair, knee surgery on the  Jenna [name], Jenna [name] ([id number]) Visit Report for 12/06/2021 Chief Complaint Document Details Patient Name: Jenna [name], Jenna [name]. Date of Service: 12/06/2021 12:45 PM Medical Record Number: [id number] Patient Account Number: [id number] Date of Birth/Sex: August 07, 1931 (87 y.o. F) Treating RN: Cornell Barman Primary Care Provider: [name], Dibas Other Clinician: Referring Provider: [name], Dibas Treating Provider/Extender: Skipper Cliche in Treatment: 0 Information Obtained from: Patient Chief Complaint Left LE Cellulitis Electronic Signature(s) Signed: 12/06/2021 1:18:16 PM By: Worthy Keeler PA-C Previous Signature: 12/06/2021 1:18:02 PM Version By: Worthy Keeler PA-C Entered By: Worthy Keeler on 12/06/2021 13:18:15 Jenna [name] ([id number]) -------------------------------------------------------------------------------- HPI Details Patient Name: Jenna [name] Date of Service: 12/06/2021 12:45 PM Medical Record Number: [id number] Patient Account Number: [id number] Date of Birth/Sex: 26-Nov-1931 (87 y.o. F) Treating RN: Cornell Barman Primary Care Provider: [name], Dibas Other Clinician: Referring Provider: [name], Dibas Treating Provider/Extender: Skipper Cliche in Treatment: 0 History of Present Illness Location: left lower extremity in the medial ankle region Quality: Patient reports experiencing a dull pain to affected area(s). Severity: Patient states wound are getting worse. Duration: Patient has had the wound for > 3 months prior to seeking treatment at the wound center Timing: Pain in wound is Intermittent (comes and goes Context: The wound appeared gradually over time Modifying Factors: Other treatment(s) tried include:[name] Associated Signs and Symptoms: Patient reports having increase swelling. HPI Description: 87 year old patient with a past medical history significant for venous stasis ulceration and diabetes  mellitus was recently admitted to the hospital between 07/07/2016 and 07/09/2016. She was wound to have a nonpurulent cellulitis of the left lower extremity and was started on IV antibiotics which included vancomycin. He is discharged home with her and Unna's boots and oral doxycycline. During this admission there was no obvious DVT involving the left lower extremity. her past medical history significant for diabetes mellitus, hypertension, hyperlipidemia, varicose veins, status post right rotator cuff repair, knee surgery on the left,robotic abdominal hysterectomy, laparoscopic cholecystectomy. She is not a smoker. Venous study done on 07/18/2015 showed normal left extremity deep venous system with no evidence of DVT. There was extensive valvular incompetence and reflux throughout the left greater saphenous vein with direct the medication to subcutaneous varicose veins. Normal left small saphenous vein. I understand she was seen by vascular radiology group and the workup and recommendation was for endovenous ablation but due to family pressure she was not able to keep her appointment a year ago. She has not been wearing her compression stockings regularly. 05/26/18 READMISSION this is an 87 year old woman who was previously seen in this clinic in 2017 by Dr. Con Memos. She has chronic venous insufficiency with lymphedema and at that time had open area on the left medial calf and ankle area. Was recommended that she wear 20-30 mm compression stockings and the patient tells me that she has been compliant with this although I think her primary doctor recently told her that she didn't need to wear stockings out of fear it might cause arterial compression. She noticed edema and pain in the area a week to 2 ago. She saw her primary doctor and was given doxycycline and Silvadene cream to put on the area. She states things are a lot better. The patient has a history of type 2 diabetes on oral agents but she is  unaware of her hemoglobin A1c for her blood glucose which she doesn't check. She has hypertension, varicose veins, rotator cuff repair, knee surgery on the  III, Rosmary Dionisio PA-C Entered By: Worthy Keeler on 12/06/2021 13:36:56 Dimaggio, [name] ([id number]) -------------------------------------------------------------------------------- Physician Orders Details Patient Name: Jenna [name] Date of Service: 12/06/2021 12:45 PM Medical Record Number: [id number] Patient Account Number: [id number] Date of Birth/Sex: [birth year] (87 y.o. F) Treating RN: Levora Dredge Primary Care Provider: [name], Dibas Other Clinician: Cornell Barman Referring Provider: [name], Dibas Treating Provider/Extender: Skipper Cliche in Treatment: 0 Verbal / Phone Orders:  No Diagnosis Coding ICD-10 Coding Code Description L03.116 Cellulitis of left lower limb I87.2 Venous insufficiency (chronic) (peripheral) I89.0 Lymphedema, not elsewhere classified E11.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 T51 Essential (primary) hypertension Discharge From [place] Only o Wear compression garments daily. Put garments on first thing when you wake up and remove them before bed. o Moisturize legs daily after removing compression garments. o Elevate, Exercise Daily and Avoid Standing for Long Periods of Time. Electronic Signature(s) Signed: 12/06/2021 3:18:11 PM By: Gretta Cool, [name], RN, CWS, [name], [name] Signed: 12/06/2021 6:06:35 PM By: Worthy Keeler PA-C Previous Signature: 12/06/2021 1:29:03 PM Version By: Levora Dredge Entered By: Gretta Cool [name], RN, CWS, Kim on 12/06/2021 15:18:11 DEAYSIA, GRIGORYAN ([id number]) -------------------------------------------------------------------------------- Problem List Details Patient Name: NIXON, SPARR. Date of Service: 12/06/2021 12:45 PM Medical Record Number: [id number] Patient Account Number: [id number] Date of Birth/Sex: 1931-08-05 (87 y.o. F) Treating RN: Cornell Barman Primary Care Provider: [name], Dibas Other Clinician: Referring Provider: [name], Dibas Treating Provider/Extender: Skipper Cliche in Treatment: 0 Active Problems ICD-10 Encounter Code Description Active Date MDM Diagnosis L03.116 Cellulitis of left lower limb 12/06/2021 No Yes I87.2 Venous insufficiency (chronic) (peripheral) 12/06/2021 No Yes I89.0 Lymphedema, not elsewhere classified 12/06/2021 No Yes E11.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 62/06/349 No Yes I10 Essential (primary) hypertension 12/06/2021 No Yes Inactive Problems Resolved Problems Electronic Signature(s) Signed: 12/06/2021 1:17:49 PM By: Worthy Keeler PA-C Entered By: Worthy Keeler on 12/06/2021 13:17:49 Worthing,  [name] ([id number]) -------------------------------------------------------------------------------- Progress Note Details Patient Name: Jenna [name] Date of Service: 12/06/2021 12:45 PM Medical Record Number: [id number] Patient Account Number: [id number] Date of Birth/Sex: 1931/05/30 (87 y.o. F) Treating RN: Cornell Barman Primary Care Provider: [name], Dibas Other Clinician: Referring Provider: [name], Dibas Treating Provider/Extender: Skipper Cliche in Treatment: 0 Subjective Chief Complaint Information obtained from Patient Left LE Cellulitis History of Present Illness (HPI) The following HPI elements were documented for the patient's wound: Location: left lower extremity in the medial ankle region Quality: Patient reports experiencing a dull pain to affected area(s). Severity: Patient states wound are getting worse. Duration: Patient has had the wound for > 3 months prior to seeking treatment at the wound center Timing: Pain in wound is Intermittent (comes and goes Context: The wound appeared gradually over time Modifying Factors: Other treatment(s) tried include:[name] Associated Signs and Symptoms: Patient reports having increase swelling. 87 year old patient with a past medical history significant for venous stasis ulceration and diabetes mellitus was recently admitted to the hospital between 07/07/2016 and 07/09/2016. She was wound to have a nonpurulent cellulitis of the left lower extremity and was started on IV antibiotics which included vancomycin. He is discharged home with her and Unna's boots and oral doxycycline. During this admission there was no obvious DVT involving the left lower extremity. her past medical history significant for diabetes mellitus, hypertension, hyperlipidemia, varicose veins, status post right rotator cuff repair, knee surgery on the left,robotic abdominal hysterectomy, laparoscopic  cholecystectomy. She  III, Rosmary Dionisio PA-C Entered By: Worthy Keeler on 12/06/2021 13:36:56 Dimaggio, [name] ([id number]) -------------------------------------------------------------------------------- Physician Orders Details Patient Name: Jenna [name] Date of Service: 12/06/2021 12:45 PM Medical Record Number: [id number] Patient Account Number: [id number] Date of Birth/Sex: [birth year] (87 y.o. F) Treating RN: Levora Dredge Primary Care Provider: [name], Dibas Other Clinician: Cornell Barman Referring Provider: [name], Dibas Treating Provider/Extender: Skipper Cliche in Treatment: 0 Verbal / Phone Orders:  No Diagnosis Coding ICD-10 Coding Code Description L03.116 Cellulitis of left lower limb I87.2 Venous insufficiency (chronic) (peripheral) I89.0 Lymphedema, not elsewhere classified E11.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 T51 Essential (primary) hypertension Discharge From [place] Only o Wear compression garments daily. Put garments on first thing when you wake up and remove them before bed. o Moisturize legs daily after removing compression garments. o Elevate, Exercise Daily and Avoid Standing for Long Periods of Time. Electronic Signature(s) Signed: 12/06/2021 3:18:11 PM By: Gretta Cool, [name], RN, CWS, [name], [name] Signed: 12/06/2021 6:06:35 PM By: Worthy Keeler PA-C Previous Signature: 12/06/2021 1:29:03 PM Version By: Levora Dredge Entered By: Gretta Cool [name], RN, CWS, Kim on 12/06/2021 15:18:11 DEAYSIA, GRIGORYAN ([id number]) -------------------------------------------------------------------------------- Problem List Details Patient Name: NIXON, SPARR. Date of Service: 12/06/2021 12:45 PM Medical Record Number: [id number] Patient Account Number: [id number] Date of Birth/Sex: 1931-08-05 (87 y.o. F) Treating RN: Cornell Barman Primary Care Provider: [name], Dibas Other Clinician: Referring Provider: [name], Dibas Treating Provider/Extender: Skipper Cliche in Treatment: 0 Active Problems ICD-10 Encounter Code Description Active Date MDM Diagnosis L03.116 Cellulitis of left lower limb 12/06/2021 No Yes I87.2 Venous insufficiency (chronic) (peripheral) 12/06/2021 No Yes I89.0 Lymphedema, not elsewhere classified 12/06/2021 No Yes E11.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 62/06/349 No Yes I10 Essential (primary) hypertension 12/06/2021 No Yes Inactive Problems Resolved Problems Electronic Signature(s) Signed: 12/06/2021 1:17:49 PM By: Worthy Keeler PA-C Entered By: Worthy Keeler on 12/06/2021 13:17:49 Worthing,  [name] ([id number]) -------------------------------------------------------------------------------- Progress Note Details Patient Name: Jenna [name] Date of Service: 12/06/2021 12:45 PM Medical Record Number: [id number] Patient Account Number: [id number] Date of Birth/Sex: 1931/05/30 (87 y.o. F) Treating RN: Cornell Barman Primary Care Provider: [name], Dibas Other Clinician: Referring Provider: [name], Dibas Treating Provider/Extender: Skipper Cliche in Treatment: 0 Subjective Chief Complaint Information obtained from Patient Left LE Cellulitis History of Present Illness (HPI) The following HPI elements were documented for the patient's wound: Location: left lower extremity in the medial ankle region Quality: Patient reports experiencing a dull pain to affected area(s). Severity: Patient states wound are getting worse. Duration: Patient has had the wound for > 3 months prior to seeking treatment at the wound center Timing: Pain in wound is Intermittent (comes and goes Context: The wound appeared gradually over time Modifying Factors: Other treatment(s) tried include:[name] Associated Signs and Symptoms: Patient reports having increase swelling. 87 year old patient with a past medical history significant for venous stasis ulceration and diabetes mellitus was recently admitted to the hospital between 07/07/2016 and 07/09/2016. She was wound to have a nonpurulent cellulitis of the left lower extremity and was started on IV antibiotics which included vancomycin. He is discharged home with her and Unna's boots and oral doxycycline. During this admission there was no obvious DVT involving the left lower extremity. her past medical history significant for diabetes mellitus, hypertension, hyperlipidemia, varicose veins, status post right rotator cuff repair, knee surgery on the left,robotic abdominal hysterectomy, laparoscopic  cholecystectomy. She  Hereditary Spherocytosis: No; Hypertension: Yes - Maternal Grandparents,Paternal Grandparents,Mother,Father,Siblings; Kidney Disease: No; Lung Disease: Yes - Siblings; Seizures: No; Stroke: No; Thyroid Problems: No; Tuberculosis: No; Never smoker; Marital Status - Married; Alcohol Use: Never; Drug Use: No History; Caffeine Use: Daily - coffee; Financial Concerns: No; Food, Clothing or Shelter Needs: No; Support System Lacking: No; Transportation Concerns: No Engineer, maintenance) Signed: 12/06/2021 4:48:50 PM By: Gretta Cool, [name], RN, CWS, [name], [name] Signed: 12/06/2021 6:06:35 PM By: Worthy Keeler PA-C Signed: 12/09/2021 11:01:31 AM By: Levora Dredge Entered By: Gretta Cool [name], RN, CWS, Kim on 12/06/2021 15:17:00 Jenna [name], Jenna [name] ([id number]) Jenna [name], Jenna [name] ([id number]) -------------------------------------------------------------------------------- SuperBill Details Patient Name: Jenna [name]. Date of Service: 12/06/2021 Medical Record Number: [id number] Patient Account Number: [id number] Date of Birth/Sex: 11-08-1931 (87 y.o. 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 H03 Essential (primary) hypertension Facility Procedures CPT4 Code: 88828003 Description: [phone] - WOUND CARE VISIT-LEV 2 EST PT Modifier: Quantity:  1 Physician Procedures CPT4 Code: 1505697 Description: 94801 - WC PHYS LEVEL 3 - EST PT Modifier: Quantity: 1 CPT4 Code: Description: ICD-10 Diagnosis Description L03.116 Cellulitis of left lower limb I87.2 Venous insufficiency (chronic) (peripheral) I89.0 Lymphedema, not elsewhere classified E11.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 Modifier: Quantity: Electronic Signature(s) Signed: 12/06/2021 1:40:53 PM By: Worthy Keeler PA-C Entered By: Worthy Keeler on 12/06/2021 13:40:52  Hereditary Spherocytosis: No; Hypertension: Yes - Maternal Grandparents,Paternal Grandparents,Mother,Father,Siblings; Kidney Disease: No; Lung Disease: Yes - Siblings; Seizures: No; Stroke: No; Thyroid Problems: No; Tuberculosis: No; Never smoker; Marital Status - Married; Alcohol Use: Never; Drug Use: No History; Caffeine Use: Daily - coffee; Financial Concerns: No; Food, Clothing or Shelter Needs: No; Support System Lacking: No; Transportation Concerns: No Engineer, maintenance) Signed: 12/06/2021 4:48:50 PM By: Gretta Cool, [name], RN, CWS, [name], [name] Signed: 12/06/2021 6:06:35 PM By: Worthy Keeler PA-C Signed: 12/09/2021 11:01:31 AM By: Levora Dredge Entered By: Gretta Cool [name], RN, CWS, Kim on 12/06/2021 15:17:00 Jenna [name], Jenna [name] ([id number]) Jenna [name], Jenna [name] ([id number]) -------------------------------------------------------------------------------- SuperBill Details Patient Name: Jenna [name]. Date of Service: 12/06/2021 Medical Record Number: [id number] Patient Account Number: [id number] Date of Birth/Sex: 11-08-1931 (87 y.o. 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 H03 Essential (primary) hypertension Facility Procedures CPT4 Code: 88828003 Description: [phone] - WOUND CARE VISIT-LEV 2 EST PT Modifier: Quantity:  1 Physician Procedures CPT4 Code: [id number] Description: 94801 - WC PHYS LEVEL 3 - EST PT Modifier: Quantity: 1 CPT4 Code: Description: ICD-10 Diagnosis Description L03.116 Cellulitis of left lower limb I87.2 Venous insufficiency (chronic) (peripheral) I89.0 Lymphedema, not elsewhere classified E11.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 Overall I think that we are headed in an appropriate direction towards getting this completely closed. 09/13/2021 upon evaluation today patient appears to be doing better in regard to the overall measurement today. In fact the measurement really does not speak to as much as this is healed due to the fact that some of the areas that are open are somewhat scattered. In general I am extremely pleased with where we stand today with healing. The patient likewise is also extremely happy with what she  is seeing and hopefully will get this closed pretty soon. 09/20/2021 upon evaluation today patient appears to be doing well at this point with regard to her wound. She has been tolerating the dressing changes without complication. Fortunately there does not appear to be any signs of active infection at this time. In fact I feel like this is probably completely healed. 09/30/2021 upon evaluation today patient actually appears to be doing quite well in regard to her wound. She has been tolerating the dressing changes without complication. Fortunately there does not appear to be any signs of active infection which is great news. No fevers, chills, nausea, vomiting, or diarrhea. Readmission: 12/06/2021 upon evaluation today patient appears to be doing decently well in regard to her left leg. She had previously had an issue here with cellulitis that she did go to an urgent care for and they placed her on doxycycline. The good news is this seems to have gotten much better. She tells me since I last saw her she has been wearing her compression socks and this happened despite that nonetheless. She does not take any fluid pills right now. Fortunately there is no signs of active infection locally nor systemically at this point. Electronic Signature(s) Signed: 12/06/2021 1:29:03 PM By: Worthy Keeler PA-C Entered By: Worthy Keeler on 12/06/2021 13:29:03 Jenna [name], Jenna [name] ([id number]) -------------------------------------------------------------------------------- Physical Exam Details Patient Name: Jenna [name] Date of Service: 12/06/2021 12:45 PM Medical Record Number: [id number] Patient Account Number: [id number] Date of Birth/Sex: [birth year] (87 y.o. F) Treating RN: Cornell Barman Primary Care Provider: [name], [place]ib[place] Other Clinician: Referring Provider: [name], [place] Treating Provider/Extender: Skipper Cliche in Treatment: 0 Constitutional patient is hypertensive.. pulse regular and within  target range for patient.Marland Kitchen respirations regular, non-labored and within target range for patient.Marland Kitchen temperature within target range for patient.. Well-nourished and well-hydrated in no acute distress. Eyes conjunctiva clear no eyelid edema noted. pupils equal round and reactive to light and accommodation. Ears, Nose, Mouth, and Throat no gross abnormality of ear auricles or external auditory canals. normal hearing noted during conversation. mucus membranes moist. Respiratory normal breathing without difficulty. Cardiovascular 2+ dorsalis pedis/posterior tibialis pulses. no clubbing, cyanosis, significant edema, <3 sec cap refill. Musculoskeletal normal gait and posture. no significant deformity or arthritic changes, no loss or range of motion, no clubbing. Psychiatric this patient is able to make decisions and demonstrates good insight into disease process. Alert and Oriented x 3. pleasant and cooperative. Notes Upon inspection patient's wound bed actually showed signs of good granulation and epithelization at this point. In fact I do not see any things currently open that I think need to be addressed. I think the biggest thing she needs is some lotion and to be honest to continue with her compression stockings to wear at nighttime. Electronic Signature(s) Signed: 12/06/2021 1:36:56 PM By: Joaquim Lai  Jenna [name], Jenna [name] ([id number]) Visit Report for 12/06/2021 Chief Complaint Document Details Patient Name: Jenna [name], Jenna [name]. Date of Service: 12/06/2021 12:45 PM Medical Record Number: [id number] Patient Account Number: [id number] Date of Birth/Sex: August 07, 1931 (87 y.o. F) Treating RN: Cornell Barman Primary Care Provider: [name], Dibas Other Clinician: Referring Provider: [name], [place]ibas Treating Provider/Extender: Skipper Cliche in Treatment: 0 Information Obtained from: Patient Chief Complaint Left LE Cellulitis Electronic Signature(s) Signed: 12/06/2021 1:18:16 PM By: Worthy Keeler PA-C Previous Signature: 12/06/2021 1:18:02 PM Version By: Worthy Keeler PA-C Entered By: Worthy Keeler on 12/06/2021 13:18:15 Jenna [name] ([id number]) -------------------------------------------------------------------------------- HPI Details Patient Name: Jenna [name] Date of Service: 12/06/2021 12:45 PM Medical Record Number: [id number] Patient Account Number: [id number] Date of Birth/Sex: 26-Nov-1931 (87 y.o. F) Treating RN: Cornell Barman Primary Care Provider: [name], Dibas Other Clinician: Referring Provider: [name], Dibas Treating Provider/Extender: Skipper Cliche in Treatment: 0 History of Present Illness Location: left lower extremity in the medial ankle region Quality: Patient reports experiencing a dull pain to affected area(s). Severity: Patient states wound are getting worse. Duration: Patient has had the wound for > 3 months prior to seeking treatment at the wound center Timing: Pain in wound is Intermittent (comes and goes Context: The wound appeared gradually over time Modifying Factors: Other treatment(s) tried include:[name] Associated Signs and Symptoms: Patient reports having increase swelling. HPI Description: 87 year old patient with a past medical history significant for venous stasis ulceration and diabetes  mellitus was recently admitted to the hospital between 07/07/2016 and 07/09/2016. She was wound to have a nonpurulent cellulitis of the left lower extremity and was started on IV antibiotics which included vancomycin. He is discharged home with her and Unna's boots and oral doxycycline. During this admission there was no obvious DVT involving the left lower extremity. her past medical history significant for diabetes mellitus, hypertension, hyperlipidemia, varicose veins, status post right rotator cuff repair, knee surgery on the left,robotic abdominal hysterectomy, laparoscopic cholecystectomy. She is not a smoker. Venous study done on 07/18/2015 showed normal left extremity deep venous system with no evidence of DVT. There was extensive valvular incompetence and reflux throughout the left greater saphenous vein with direct the medication to subcutaneous varicose veins. Normal left small saphenous vein. I understand she was seen by vascular radiology group and the workup and recommendation was for endovenous ablation but due to family pressure she was not able to keep her appointment a year ago. She has not been wearing her compression stockings regularly. 05/26/18 READMISSION this is an 87 year old woman who was previously seen in this clinic in 2017 by Dr. Con Memos. She has chronic venous insufficiency with lymphedema and at that time had open area on the left medial calf and ankle area. Was recommended that she wear 20-30 mm compression stockings and the patient tells me that she has been compliant with this although I think her primary doctor recently told her that she didn't need to wear stockings out of fear it might cause arterial compression. She noticed edema and pain in the area a week to 2 ago. She saw her primary doctor and was given doxycycline and Silvadene cream to put on the area. She states things are a lot better. The patient has a history of type 2 diabetes on oral agents but she is  unaware of her hemoglobin A1c for her blood glucose which she doesn't check. She has hypertension, varicose veins, rotator cuff repair, knee surgery on the  Jenna [name], Jenna [name] ([id number]) Visit Report for 12/06/2021 Chief Complaint Document Details Patient Name: Jenna [name], Jenna [name]. Date of Service: 12/06/2021 12:45 PM Medical Record Number: [id number] Patient Account Number: [id number] Date of Birth/Sex: August 07, 1931 (87 y.o. F) Treating RN: Cornell Barman Primary Care Provider: [name], Dibas Other Clinician: Referring Provider: [name], Dibas Treating Provider/Extender: Skipper Cliche in Treatment: 0 Information Obtained from: Patient Chief Complaint Left LE Cellulitis Electronic Signature(s) Signed: 12/06/2021 1:18:16 PM By: Worthy Keeler PA-C Previous Signature: 12/06/2021 1:18:02 PM Version By: Worthy Keeler PA-C Entered By: Worthy Keeler on 12/06/2021 13:18:15 Jenna [name] ([id number]) -------------------------------------------------------------------------------- HPI Details Patient Name: Jenna [name] Date of Service: 12/06/2021 12:45 PM Medical Record Number: [id number] Patient Account Number: [id number] Date of Birth/Sex: 26-Nov-1931 (87 y.o. F) Treating RN: Cornell Barman Primary Care Provider: [name], Dibas Other Clinician: Referring Provider: [name], Dibas Treating Provider/Extender: Skipper Cliche in Treatment: 0 History of Present Illness Location: left lower extremity in the medial ankle region Quality: Patient reports experiencing a dull pain to affected area(s). Severity: Patient states wound are getting worse. Duration: Patient has had the wound for > 3 months prior to seeking treatment at the wound center Timing: Pain in wound is Intermittent (comes and goes Context: The wound appeared gradually over time Modifying Factors: Other treatment(s) tried include:[name] Associated Signs and Symptoms: Patient reports having increase swelling. HPI Description: 87 year old patient with a past medical history significant for venous stasis ulceration and diabetes  mellitus was recently admitted to the hospital between 07/07/2016 and 07/09/2016. She was wound to have a nonpurulent cellulitis of the left lower extremity and was started on IV antibiotics which included vancomycin. He is discharged home with her and Unna's boots and oral doxycycline. During this admission there was no obvious DVT involving the left lower extremity. her past medical history significant for diabetes mellitus, hypertension, hyperlipidemia, varicose veins, status post right rotator cuff repair, knee surgery on the left,robotic abdominal hysterectomy, laparoscopic cholecystectomy. She is not a smoker. Venous study done on 07/18/2015 showed normal left extremity deep venous system with no evidence of DVT. There was extensive valvular incompetence and reflux throughout the left greater saphenous vein with direct the medication to subcutaneous varicose veins. Normal left small saphenous vein. I understand she was seen by vascular radiology group and the workup and recommendation was for endovenous ablation but due to family pressure she was not able to keep her appointment a year ago. She has not been wearing her compression stockings regularly. 05/26/18 READMISSION this is an 87 year old woman who was previously seen in this clinic in 2017 by Dr. Con Memos. She has chronic venous insufficiency with lymphedema and at that time had open area on the left medial calf and ankle area. Was recommended that she wear 20-30 mm compression stockings and the patient tells me that she has been compliant with this although I think her primary doctor recently told her that she didn't need to wear stockings out of fear it might cause arterial compression. She noticed edema and pain in the area a week to 2 ago. She saw her primary doctor and was given doxycycline and Silvadene cream to put on the area. She states things are a lot better. The patient has a history of type 2 diabetes on oral agents but she is  unaware of her hemoglobin A1c for her blood glucose which she doesn't check. She has hypertension, varicose veins, rotator cuff repair, knee surgery on the

## 2021-12-09 NOTE — Progress Notes (Signed)
Jenna [name], Jenna [name] ([id number]) Visit Report for 12/06/2021 Allergy List Details Patient Name: Jenna [name], Jenna [name]. Date of Service: 12/06/2021 12:45 PM Medical Record Number: [id number] Patient Account Number: [id number] Date of Birth/Sex: 10-Aug-1931 (87 y.o. F) Treating RN: Levora Dredge Primary Care Marieke Lubke: [name], [place]ibas Other Clinician: Cornell Barman Referring Alesia Oshields: [name], [place] Treating Kately Graffam/Extender: Skipper Cliche in Treatment: 0 Allergies Active Allergies Shellfish Containing Products Reaction: anaphlaxsis Severity: Severe Ancef Reaction: nausea and vomiting Severity: Severe shellfish derived Reaction: anaphalaxis Allergy Notes Electronic Signature(s) Signed: 12/06/2021 3:16:52 PM By: Gretta Cool, [name], RN, CWS, [name], [name] Entered By: Gretta Cool, [name], RN, CWS, Kim on 12/06/2021 15:16:51 Jenna [name] ([id number]) -------------------------------------------------------------------------------- Arrival Information Details Patient Name: Jenna [name] Date of Service: 12/06/2021 12:45 PM Medical Record Number: [id number] Patient Account Number: [id number] Date of Birth/Sex: 02/21/1931 (87 y.o. F) Treating RN: Levora Dredge Primary Care Andrina Locken: [name], Dibas Other Clinician: Cornell Barman Referring Mal Asher: [name], [place]ibas Treating Aerika Groll/Extender: Skipper Cliche in Treatment: 0 Visit Information Patient Arrived: Cane Arrival Time: 12:46 Accompanied By: husband Transfer Assistance: None Patient Identification Verified: Yes Secondary Verification Process Completed: Yes History Since Last Visit Added or deleted any medications: No Any new allergies or adverse reactions: No Had a fall or experienced change in activities of daily living that may affect risk of falls: No Hospitalized since last visit: No Has Dressing in Place as Prescribed: No Has Compression in Place as Prescribed: No Electronic Signature(s) Signed: 12/06/2021 3:16:21 PM By: Gretta Cool, [name],  RN, CWS, [name], [name] Entered By: Gretta Cool, [name], RN, CWS, Kim on 12/06/2021 15:16:21 Jenna [name] ([id number]) -------------------------------------------------------------------------------- Clinic Level of Care Assessment Details Patient Name: Jenna [name] Date of Service: 12/06/2021 12:45 PM Medical Record Number: [id number] Patient Account Number: [id number] Date of Birth/Sex: Nov 17, 1931 (87 y.o. F) Treating RN: Levora Dredge Primary Care Tryphena Perkovich: [name], Dibas Other Clinician: Cornell Barman Referring Anyelina Claycomb: [name], [place]ib[place] Treating Ginia Rudell/Extender: Skipper Cliche in Treatment: 0 Clinic Level of Care Assessment Items TOOL 2 Quantity Score X - Use when only an EandM is performed on the INITIAL visit 1 0 ASSESSMENTS - Nursing Assessment / Reassessment X - General Physical Exam (combine w/ comprehensive assessment (listed just below) when performed on new 1 20 pt. evals) X- 1 25 Comprehensive Assessment (HX, ROS, Risk Assessments, Wounds Hx, etc.) ASSESSMENTS - Wound and Skin Assessment / Reassessment []  - Simple Wound Assessment / Reassessment - one wound 0 []  - 0 Complex Wound Assessment / Reassessment - multiple wounds []  - 0 Dermatologic / Skin Assessment (not related to wound area) ASSESSMENTS - Ostomy and/or Continence Assessment and Care []  - Incontinence Assessment and Management 0 []  - 0 Ostomy Care Assessment and Management (repouching, etc.) PROCESS - Coordination of Care X - Simple Patient / Family Education for ongoing care 1 15 []  - 0 Complex (extensive) Patient / Family Education for ongoing care []  - 0 Staff obtains Programmer, systems, Records, Test Results / Process Orders []  - 0 Staff telephones HHA, Nursing Homes / Clarify orders / etc []  - 0 Routine Transfer to another Facility (non-emergent condition) []  - 0 Routine Hospital Admission (non-emergent condition) []  - 0 New Admissions / Biomedical engineer / Ordering NPWT, Apligraf, etc. []  -  0 Emergency Hospital Admission (emergent condition) X- 1 10 Simple Discharge Coordination []  - 0 Complex (extensive) Discharge Coordination PROCESS - Special Needs []  - Pediatric / Minor Patient Management 0 []  - 0 Isolation Patient Management []  - 0 Hearing / Language / Visual special needs []  - 0  Jenna [name], Jenna [name] ([id number]) Visit Report for 12/06/2021 Allergy List Details Patient Name: Jenna [name], Jenna [name]. Date of Service: 12/06/2021 12:45 PM Medical Record Number: [id number] Patient Account Number: [id number] Date of Birth/Sex: 10-Aug-1931 (87 y.o. F) Treating RN: Levora Dredge Primary Care Marieke Lubke: [name], Dibas Other Clinician: Cornell Barman Referring Alesia Oshields: [name], [place]ibas Treating Kately Graffam/Extender: Skipper Cliche in Treatment: 0 Allergies Active Allergies Shellfish Containing Products Reaction: anaphlaxsis Severity: Severe Ancef Reaction: nausea and vomiting Severity: Severe shellfish derived Reaction: anaphalaxis Allergy Notes Electronic Signature(s) Signed: 12/06/2021 3:16:52 PM By: Gretta Cool, [name], RN, CWS, [name], [name] Entered By: Gretta Cool, [name], RN, CWS, Kim on 12/06/2021 15:16:51 Jenna [name] ([id number]) -------------------------------------------------------------------------------- Arrival Information Details Patient Name: Jenna [name] Date of Service: 12/06/2021 12:45 PM Medical Record Number: [id number] Patient Account Number: [id number] Date of Birth/Sex: 02/21/1931 (87 y.o. F) Treating RN: Levora Dredge Primary Care Andrina Locken: [name], Dibas Other Clinician: Cornell Barman Referring Mal Asher: [name], Dibas Treating Aerika Groll/Extender: Skipper Cliche in Treatment: 0 Visit Information Patient Arrived: Cane Arrival Time: 12:46 Accompanied By: husband Transfer Assistance: None Patient Identification Verified: Yes Secondary Verification Process Completed: Yes History Since Last Visit Added or deleted any medications: No Any new allergies or adverse reactions: No Had a fall or experienced change in activities of daily living that may affect risk of falls: No Hospitalized since last visit: No Has Dressing in Place as Prescribed: No Has Compression in Place as Prescribed: No Electronic Signature(s) Signed: 12/06/2021 3:16:21 PM By: Gretta Cool, [name],  RN, CWS, [name], [name] Entered By: Gretta Cool, [name], RN, CWS, Kim on 12/06/2021 15:16:21 Jenna [name] ([id number]) -------------------------------------------------------------------------------- Clinic Level of Care Assessment Details Patient Name: Jenna [name] Date of Service: 12/06/2021 12:45 PM Medical Record Number: [id number] Patient Account Number: [id number] Date of Birth/Sex: Nov 17, 1931 (87 y.o. F) Treating RN: Levora Dredge Primary Care Tryphena Perkovich: [name], Dibas Other Clinician: Cornell Barman Referring Anyelina Claycomb: [name], [place]ibas Treating Ginia Rudell/Extender: Skipper Cliche in Treatment: 0 Clinic Level of Care Assessment Items TOOL 2 Quantity Score X - Use when only an EandM is performed on the INITIAL visit 1 0 ASSESSMENTS - Nursing Assessment / Reassessment X - General Physical Exam (combine w/ comprehensive assessment (listed just below) when performed on new 1 20 pt. evals) X- 1 25 Comprehensive Assessment (HX, ROS, Risk Assessments, Wounds Hx, etc.) ASSESSMENTS - Wound and Skin Assessment / Reassessment []  - Simple Wound Assessment / Reassessment - one wound 0 []  - 0 Complex Wound Assessment / Reassessment - multiple wounds []  - 0 Dermatologic / Skin Assessment (not related to wound area) ASSESSMENTS - Ostomy and/or Continence Assessment and Care []  - Incontinence Assessment and Management 0 []  - 0 Ostomy Care Assessment and Management (repouching, etc.) PROCESS - Coordination of Care X - Simple Patient / Family Education for ongoing care 1 15 []  - 0 Complex (extensive) Patient / Family Education for ongoing care []  - 0 Staff obtains Programmer, systems, Records, Test Results / Process Orders []  - 0 Staff telephones HHA, Nursing Homes / Clarify orders / etc []  - 0 Routine Transfer to another Facility (non-emergent condition) []  - 0 Routine Hospital Admission (non-emergent condition) []  - 0 New Admissions / Biomedical engineer / Ordering NPWT, Apligraf, etc. []  -  0 Emergency Hospital Admission (emergent condition) X- 1 10 Simple Discharge Coordination []  - 0 Complex (extensive) Discharge Coordination PROCESS - Special Needs []  - Pediatric / Minor Patient Management 0 []  - 0 Isolation Patient Management []  - 0 Hearing / Language / Visual special needs []  - 0  Left Photos Photo Uploaded By: Levora Dredge on 12/06/2021 16:38:54 Electronic Signature(s) Signed: 12/09/2021 11:01:31 AM By: Levora Dredge Entered By: Levora Dredge on 12/06/2021 13:12:20 KAMORIE, ALDOUS ([id number]) -------------------------------------------------------------------------------- Pain Assessment Details Patient Name: Jenna [name] Date of Service: 12/06/2021 12:45 PM Medical Record Number: [id number] Patient Account Number: [id number] Date of Birth/Sex: 1931/04/10 (87 y.o. F) Treating RN: Levora Dredge Primary Care Timouthy Gilardi: [name], Dibas Other Clinician: Cornell Barman Referring Seven [place]: [name], Dibas Treating Alina Gilkey/Extender: Skipper Cliche in Treatment: 0 Active Problems Location of Pain Severity and Description of Pain Patient Has Paino No Site Locations Rate the pain. Current Pain Level: 0 Pain Management and Medication Current Pain Management: Electronic Signature(s) Signed: 12/06/2021 3:16:28 PM By: Gretta Cool, [name], RN, CWS, [name], [name] Signed: 12/09/2021 11:01:31 AM By: Levora Dredge Entered By: Gretta Cool [name], RN, CWS, Kim on 12/06/2021 15:16:28 Jenna [name]  ([id number]) -------------------------------------------------------------------------------- Patient/Caregiver Education Details Patient Name: Jenna [name], Jenna [name]. Date of Service: 12/06/2021 12:45 PM Medical Record Number: [id number] Patient Account Number: [id number] Date of Birth/Gender: [birth year] (87 y.o. F) Treating RN: Levora Dredge Primary Care Physician: [name], Dibas Other Clinician: Cornell Barman Referring Physician: [name], [place]ibas Treating Physician/Extender: Skipper Cliche in Treatment: 0 Education Assessment Education Provided To: Patient Education Topics Provided Wound/Skin Impairment: Handouts: Caring for Your Ulcer Methods: Explain/Verbal Responses: State content correctly Electronic Signature(s) Signed: 12/06/2021 4:48:50 PM By: Gretta Cool, [name], RN, CWS, [name], [name] Entered By: Gretta Cool, [name], RN, CWS, Kim on 12/06/2021 15:18:31 Jenna [name] ([id number]) -------------------------------------------------------------------------------- Vitals Details Patient Name: Jenna [name] Date of Service: 12/06/2021 12:45 PM Medical Record Number: [id number] Patient Account Number: [id number] Date of Birth/Sex: 20-Apr-1931 (87 y.o. F) Treating RN: Levora Dredge Primary Care Dannetta Lekas: [name], Dibas Other Clinician: Cornell Barman Referring Halina Asano: [name], [place]ibas Treating Vidur Knust/Extender: Skipper Cliche in Treatment: 0 Vital Signs Time Taken: 12:48 Temperature (F): 98 Height (in): 60 Pulse (bpm): 71 Source: Stated Respiratory Rate (breaths/min): 18 Weight (lbs): 200 Blood Pressure (mmHg): 170/88 Source: Stated Reference Range: 80 - 120 mg / dl Body Mass Index (BMI): 39.1 Electronic Signature(s) Signed: 12/06/2021 3:16:33 PM By: Gretta Cool, [name], RN, CWS, [name], [name] Entered By: Gretta Cool, [name], RN, CWS, Kim on 12/06/2021 15:16:32

## 2021-12-10 DIAGNOSIS — H25811 Combined forms of age-related cataract, right eye: Secondary | ICD-10-CM | POA: Diagnosis not present

## 2021-12-10 DIAGNOSIS — H401123 Primary open-angle glaucoma, left eye, severe stage: Secondary | ICD-10-CM | POA: Diagnosis not present

## 2021-12-10 DIAGNOSIS — H59032 Cystoid macular edema following cataract surgery, left eye: Secondary | ICD-10-CM | POA: Diagnosis not present

## 2021-12-10 DIAGNOSIS — H401112 Primary open-angle glaucoma, right eye, moderate stage: Secondary | ICD-10-CM | POA: Diagnosis not present

## 2021-12-10 DIAGNOSIS — H43813 Vitreous degeneration, bilateral: Secondary | ICD-10-CM | POA: Diagnosis not present

## 2022-01-20 DIAGNOSIS — Z20822 Contact with and (suspected) exposure to covid-19: Secondary | ICD-10-CM | POA: Diagnosis not present

## 2022-01-21 DIAGNOSIS — E119 Type 2 diabetes mellitus without complications: Secondary | ICD-10-CM | POA: Diagnosis not present

## 2022-01-28 DIAGNOSIS — E119 Type 2 diabetes mellitus without complications: Secondary | ICD-10-CM | POA: Diagnosis not present

## 2022-02-03 DIAGNOSIS — Z20822 Contact with and (suspected) exposure to covid-19: Secondary | ICD-10-CM | POA: Diagnosis not present

## 2022-02-18 DIAGNOSIS — Z20822 Contact with and (suspected) exposure to covid-19: Secondary | ICD-10-CM | POA: Diagnosis not present

## 2022-02-24 DIAGNOSIS — Z20822 Contact with and (suspected) exposure to covid-19: Secondary | ICD-10-CM | POA: Diagnosis not present

## 2022-02-25 DIAGNOSIS — E119 Type 2 diabetes mellitus without complications: Secondary | ICD-10-CM | POA: Diagnosis not present

## 2022-03-24 DIAGNOSIS — Z20822 Contact with and (suspected) exposure to covid-19: Secondary | ICD-10-CM | POA: Diagnosis not present

## 2022-03-27 DIAGNOSIS — Z20822 Contact with and (suspected) exposure to covid-19: Secondary | ICD-10-CM | POA: Diagnosis not present

## 2022-03-28 DIAGNOSIS — E119 Type 2 diabetes mellitus without complications: Secondary | ICD-10-CM | POA: Diagnosis not present

## 2022-04-07 DIAGNOSIS — Z1231 Encounter for screening mammogram for malignant neoplasm of breast: Secondary | ICD-10-CM | POA: Diagnosis not present

## 2022-04-07 DIAGNOSIS — Z01419 Encounter for gynecological examination (general) (routine) without abnormal findings: Secondary | ICD-10-CM | POA: Diagnosis not present

## 2022-04-07 DIAGNOSIS — Z6841 Body Mass Index (BMI) 40.0 and over, adult: Secondary | ICD-10-CM | POA: Diagnosis not present

## 2022-04-07 DIAGNOSIS — Z1239 Encounter for other screening for malignant neoplasm of breast: Secondary | ICD-10-CM | POA: Diagnosis not present

## 2022-04-07 DIAGNOSIS — Z1211 Encounter for screening for malignant neoplasm of colon: Secondary | ICD-10-CM | POA: Diagnosis not present

## 2022-04-07 DIAGNOSIS — Z8542 Personal history of malignant neoplasm of other parts of uterus: Secondary | ICD-10-CM | POA: Diagnosis not present

## 2022-04-09 DIAGNOSIS — Z20822 Contact with and (suspected) exposure to covid-19: Secondary | ICD-10-CM | POA: Diagnosis not present

## 2022-04-11 DIAGNOSIS — Z20822 Contact with and (suspected) exposure to covid-19: Secondary | ICD-10-CM | POA: Diagnosis not present

## 2022-04-15 DIAGNOSIS — H401132 Primary open-angle glaucoma, bilateral, moderate stage: Secondary | ICD-10-CM | POA: Diagnosis not present

## 2022-04-15 DIAGNOSIS — E113293 Type 2 diabetes mellitus with mild nonproliferative diabetic retinopathy without macular edema, bilateral: Secondary | ICD-10-CM | POA: Diagnosis not present

## 2022-04-15 DIAGNOSIS — E1169 Type 2 diabetes mellitus with other specified complication: Secondary | ICD-10-CM | POA: Diagnosis not present

## 2022-04-15 DIAGNOSIS — Z79899 Other long term (current) drug therapy: Secondary | ICD-10-CM | POA: Diagnosis not present

## 2022-04-15 DIAGNOSIS — E7800 Pure hypercholesterolemia, unspecified: Secondary | ICD-10-CM | POA: Diagnosis not present

## 2022-04-15 DIAGNOSIS — Z0000 Encounter for general adult medical examination without abnormal findings: Secondary | ICD-10-CM | POA: Diagnosis not present

## 2022-04-15 DIAGNOSIS — I890 Lymphedema, not elsewhere classified: Secondary | ICD-10-CM | POA: Diagnosis not present

## 2022-04-15 DIAGNOSIS — I10 Essential (primary) hypertension: Secondary | ICD-10-CM | POA: Diagnosis not present

## 2022-04-15 DIAGNOSIS — M2540 Effusion, unspecified joint: Secondary | ICD-10-CM | POA: Diagnosis not present

## 2022-04-15 DIAGNOSIS — R600 Localized edema: Secondary | ICD-10-CM | POA: Diagnosis not present

## 2022-04-18 DIAGNOSIS — M2540 Effusion, unspecified joint: Secondary | ICD-10-CM | POA: Diagnosis not present

## 2022-04-24 DIAGNOSIS — Z20822 Contact with and (suspected) exposure to covid-19: Secondary | ICD-10-CM | POA: Diagnosis not present

## 2022-04-27 DIAGNOSIS — E119 Type 2 diabetes mellitus without complications: Secondary | ICD-10-CM | POA: Diagnosis not present

## 2022-04-29 DIAGNOSIS — Z79899 Other long term (current) drug therapy: Secondary | ICD-10-CM | POA: Diagnosis not present

## 2022-05-02 DIAGNOSIS — Z20822 Contact with and (suspected) exposure to covid-19: Secondary | ICD-10-CM | POA: Diagnosis not present

## 2022-05-06 DIAGNOSIS — Z20822 Contact with and (suspected) exposure to covid-19: Secondary | ICD-10-CM | POA: Diagnosis not present

## 2022-05-28 DIAGNOSIS — E119 Type 2 diabetes mellitus without complications: Secondary | ICD-10-CM | POA: Diagnosis not present

## 2022-06-03 DIAGNOSIS — H59032 Cystoid macular edema following cataract surgery, left eye: Secondary | ICD-10-CM | POA: Diagnosis not present

## 2022-06-03 DIAGNOSIS — H401123 Primary open-angle glaucoma, left eye, severe stage: Secondary | ICD-10-CM | POA: Diagnosis not present

## 2022-06-03 DIAGNOSIS — H401112 Primary open-angle glaucoma, right eye, moderate stage: Secondary | ICD-10-CM | POA: Diagnosis not present

## 2022-06-03 DIAGNOSIS — H25811 Combined forms of age-related cataract, right eye: Secondary | ICD-10-CM | POA: Diagnosis not present

## 2022-06-03 DIAGNOSIS — H43813 Vitreous degeneration, bilateral: Secondary | ICD-10-CM | POA: Diagnosis not present

## 2022-06-27 DIAGNOSIS — E119 Type 2 diabetes mellitus without complications: Secondary | ICD-10-CM | POA: Diagnosis not present

## 2022-07-28 DIAGNOSIS — E119 Type 2 diabetes mellitus without complications: Secondary | ICD-10-CM | POA: Diagnosis not present

## 2022-08-07 DIAGNOSIS — H401123 Primary open-angle glaucoma, left eye, severe stage: Secondary | ICD-10-CM | POA: Diagnosis not present

## 2022-08-07 DIAGNOSIS — H59032 Cystoid macular edema following cataract surgery, left eye: Secondary | ICD-10-CM | POA: Diagnosis not present

## 2022-08-07 DIAGNOSIS — H401112 Primary open-angle glaucoma, right eye, moderate stage: Secondary | ICD-10-CM | POA: Diagnosis not present

## 2022-08-07 DIAGNOSIS — H43813 Vitreous degeneration, bilateral: Secondary | ICD-10-CM | POA: Diagnosis not present

## 2022-08-07 DIAGNOSIS — H25811 Combined forms of age-related cataract, right eye: Secondary | ICD-10-CM | POA: Diagnosis not present

## 2022-08-21 DIAGNOSIS — M79662 Pain in left lower leg: Secondary | ICD-10-CM | POA: Diagnosis not present

## 2022-08-27 DIAGNOSIS — I8312 Varicose veins of left lower extremity with inflammation: Secondary | ICD-10-CM | POA: Diagnosis not present

## 2022-08-27 DIAGNOSIS — I8002 Phlebitis and thrombophlebitis of superficial vessels of left lower extremity: Secondary | ICD-10-CM | POA: Diagnosis not present

## 2022-08-27 DIAGNOSIS — R600 Localized edema: Secondary | ICD-10-CM | POA: Diagnosis not present

## 2022-08-28 DIAGNOSIS — E119 Type 2 diabetes mellitus without complications: Secondary | ICD-10-CM | POA: Diagnosis not present

## 2022-09-10 DIAGNOSIS — E1169 Type 2 diabetes mellitus with other specified complication: Secondary | ICD-10-CM | POA: Diagnosis not present

## 2022-09-10 DIAGNOSIS — E7800 Pure hypercholesterolemia, unspecified: Secondary | ICD-10-CM | POA: Diagnosis not present

## 2022-09-10 DIAGNOSIS — U071 COVID-19: Secondary | ICD-10-CM | POA: Diagnosis not present

## 2022-09-27 DIAGNOSIS — E119 Type 2 diabetes mellitus without complications: Secondary | ICD-10-CM | POA: Diagnosis not present

## 2022-10-07 DIAGNOSIS — I8312 Varicose veins of left lower extremity with inflammation: Secondary | ICD-10-CM | POA: Diagnosis not present

## 2022-10-27 ENCOUNTER — Encounter (INDEPENDENT_AMBULATORY_CARE_PROVIDER_SITE_OTHER): Payer: Self-pay

## 2022-10-28 DIAGNOSIS — E119 Type 2 diabetes mellitus without complications: Secondary | ICD-10-CM | POA: Diagnosis not present

## 2022-10-29 DIAGNOSIS — I83203 Varicose veins of unspecified lower extremity with both ulcer of ankle and inflammation: Secondary | ICD-10-CM | POA: Diagnosis not present

## 2022-11-11 DIAGNOSIS — H401112 Primary open-angle glaucoma, right eye, moderate stage: Secondary | ICD-10-CM | POA: Diagnosis not present

## 2022-11-11 DIAGNOSIS — H43813 Vitreous degeneration, bilateral: Secondary | ICD-10-CM | POA: Diagnosis not present

## 2022-11-11 DIAGNOSIS — H401123 Primary open-angle glaucoma, left eye, severe stage: Secondary | ICD-10-CM | POA: Diagnosis not present

## 2022-11-11 DIAGNOSIS — H59032 Cystoid macular edema following cataract surgery, left eye: Secondary | ICD-10-CM | POA: Diagnosis not present

## 2022-11-11 DIAGNOSIS — H25811 Combined forms of age-related cataract, right eye: Secondary | ICD-10-CM | POA: Diagnosis not present

## 2022-11-25 ENCOUNTER — Encounter: Payer: Medicare Other | Attending: Physician Assistant | Admitting: Physician Assistant

## 2022-11-25 DIAGNOSIS — I890 Lymphedema, not elsewhere classified: Secondary | ICD-10-CM | POA: Diagnosis not present

## 2022-11-25 DIAGNOSIS — I87332 Chronic venous hypertension (idiopathic) with ulcer and inflammation of left lower extremity: Secondary | ICD-10-CM | POA: Diagnosis not present

## 2022-11-25 DIAGNOSIS — M1990 Unspecified osteoarthritis, unspecified site: Secondary | ICD-10-CM | POA: Diagnosis not present

## 2022-11-25 DIAGNOSIS — E1151 Type 2 diabetes mellitus with diabetic peripheral angiopathy without gangrene: Secondary | ICD-10-CM | POA: Diagnosis not present

## 2022-11-25 DIAGNOSIS — I10 Essential (primary) hypertension: Secondary | ICD-10-CM | POA: Diagnosis not present

## 2022-11-25 DIAGNOSIS — E11622 Type 2 diabetes mellitus with other skin ulcer: Secondary | ICD-10-CM | POA: Diagnosis not present

## 2022-11-25 DIAGNOSIS — L97822 Non-pressure chronic ulcer of other part of left lower leg with fat layer exposed: Secondary | ICD-10-CM | POA: Insufficient documentation

## 2022-11-25 NOTE — Progress Notes (Signed)
Small (1-33%) Wound Description Classification: Grade 2 Exudate Amount: Medium Exudate Type: Serous CHRISTNA, KULICK ([id number]) Exudate Color: Foul Odor After Cleansing: No Slough/Fibrino No [id number] Page 10 of 11 amber Wound Bed Granulation Amount: Small (1-33%) Exposed Structure Granulation Quality: Red Fascia Exposed: No Necrotic Amount: Small (1-33%) Fat Layer (Subcutaneous Tissue) Exposed: Yes Necrotic Quality: Adherent Slough Tendon Exposed: No Muscle Exposed: No Joint Exposed: No Bone Exposed: No Treatment Notes Wound #6 (Lower Leg) Wound Laterality: Left, Medial Cleanser Peri-Wound Care AandD Ointment Discharge Instruction: Apply AandD Ointment as directed Topical Primary Dressing Silvercel 4 1/4x 4 1/4 (in/in) Discharge Instruction: Apply Silvercel 4 1/4x 4 1/4 (in/in) as instructed Secondary Dressing Zetuvit Plus 4x8 (in/in) Secured With [employer] or Non-Sterile 6-ply 4.5x4 (yd/yd) Discharge Instruction: Apply Kerlix as directed Compression Wrap 3-LAYER WRAP - Profore Lite LF 3 Multilayer Compression Bandaging System Discharge Instruction: Apply 3 multi-layer wrap as prescribed. Compression Stockings Add-Ons Electronic Signature(s) Signed: 11/25/2022 3:52:05 PM By: Rosalio Loud MSN RN CNS WTA Entered By: Rosalio Loud on 11/25/2022 13:30:51 -------------------------------------------------------------------------------- Vitals Details Patient Name: Date of Service: DO Jenna [name] NNE S. 11/25/2022 1:00 PM Medical Record Number: [id number] Patient Account Number: [id number] Date of Birth/Sex: Treating RN: June 18, 1931 (88 y.o. Jenna [name] Primary Care Jakyria Bleau: [name], [place] Other Clinician: Referring Lash Matulich: Treating  Ragena Fiola/Extender: Jeri Cos Self, Referral Weeks in Treatment: 0 Vital Signs Time Taken: 13:03 Temperature (F): 98.0 Height (in): 60 Pulse (bpm): 71 Source: Stated Respiratory Rate (breaths/min): 18 Weight (lbs): 200 Blood Pressure (mmHg): 180/92 Source: Stated Reference Range: 80 - 120 mg / dl ZHANNA, [name] ([id number]) [id number] Page 11 of 11 Body Mass Index (BMI): 39.1 Electronic Signature(s) Signed: 11/25/2022 3:52:05 PM By: Rosalio Loud MSN RN CNS WTA Entered By: Rosalio Loud on 11/25/2022 13:06:29  specify in notes) Date Initiated: 11/25/2022 Target Resolution Date: 12/25/2022 Goal Status: Active Patient/caregiver will verbalize understanding of skin care regimen Date Initiated: 11/25/2022 Target Resolution Date: 12/25/2022 Goal Status: Active Ulcer/skin breakdown will have a volume reduction of 30% by week 4 Date Initiated: 11/25/2022 Target Resolution Date: 12/25/2022 Goal Status: Active Ulcer/skin breakdown will have a volume reduction of 50% by week 8 Date Initiated: 11/25/2022 Target Resolution Date: 01/25/2023 Goal  Status: Active Ulcer/skin breakdown will have a volume reduction of 80% by week 12 Date Initiated: 11/25/2022 Target Resolution Date: 02/25/2023 Goal Status: Active Ulcer/skin breakdown will heal within 14 weeks Date Initiated: 11/25/2022 Target Resolution Date: 03/11/2023 Goal Status: Active Interventions: Assess patient/caregiver ability to obtain necessary supplies Assess patient/caregiver ability to perform ulcer/skin care regimen upon admission and as needed Assess ulceration(s) every visit Provide education on ulcer and skin care Treatment Activities: Skin care regimen initiated : 11/25/2022 Notes: Electronic Signature(s) Signed: 11/25/2022 2:33:22 PM By: Rosalio Loud MSN RN CNS WTA Entered By: Rosalio Loud on 11/25/2022 14:33:22 Jenna [name] ([id number]) [id number] Page 8 of 11 -------------------------------------------------------------------------------- Pain Assessment Details Patient Name: Date of Service: DO Jenna [name] NNE S. 11/25/2022 1:00 PM Medical Record Number: [id number] Patient Account Number: [id number] Date of Birth/Sex: Treating RN: 1931-08-30 (88 y.o. Jenna [name] Primary Care Darius Lundberg: [name], [place] Other Clinician: Referring Zaineb Nowaczyk: Treating Uel Davidow/Extender: Jeri Cos Self, Referral Weeks in Treatment: 0 Active Problems Location of Pain Severity and Description of Pain Patient Has Paino Yes Site Locations Pain Location: Pain in Ulcers With Dressing Change: Yes Rate the pain. Current Pain Level: 9 Worst Pain Level: 10 Least Pain Level: 6 Tolerable Pain Level: 5 Character of Pain Describe the Pain: Aching, Tender, Throbbing Pain Management and Medication Current Pain Management: Medication: Yes Cold Application: No Rest: No Massage: No Activity: No T.E.N.S.: No Heat Application: No Leg drop or elevation: No Is the Current Pain Management Adequate: Inadequate How does your wound impact your  activities of daily livingo Bathing: Yes Work: Yes Engineer, maintenance) Signed: 11/25/2022 3:52:05 PM By: Rosalio Loud MSN RN CNS WTA Entered By: Rosalio Loud on 11/25/2022 13:03:55 -------------------------------------------------------------------------------- Patient/Caregiver Education Details Patient Name: Date of Service: DO Jenna [name] 11/28/2023andnbsp1:00 PM Medical Record Number: [id number] Patient Account Number: [id number] Date of Birth/Gender: Treating RN: 1931/09/05 (88 y.o. Jenna [name] Primary Care Physician: [name], [place] Other Clinician: Referring Physician: Treating Physician/Extender: Jeri Cos Self, Referral Weeks in TreatmentHarlin Heys RHESA, [place] ([id number]) [id number].pdf Page 9 of 11 Education Assessment Education Provided To: Patient Education Topics Provided Wound/Skin Impairment: Handouts: Caring for Your Ulcer Methods: Explain/Verbal Responses: State content correctly Electronic Signature(s) Signed: 11/25/2022 3:52:05 PM By: Rosalio Loud MSN RN CNS WTA Entered By: Rosalio Loud on 11/25/2022 14:36:14 -------------------------------------------------------------------------------- Wound Assessment Details Patient Name: Date of Service: DO Jenna [name] NNE S. 11/25/2022 1:00 PM Medical Record Number: [id number] Patient Account Number: [id number] Date of Birth/Sex: Treating RN: 03-19-1931 (88 y.o. Jenna [name] Primary Care Journiee Feldkamp: [name], [place] Other Clinician: Referring Hughey Rittenberry: Treating Cailah Reach/Extender: Jeri Cos Self, Referral Weeks in Treatment: 0 Wound Status Wound Number: 6 Primary Diabetic Wound/Ulcer of the Lower Extremity Etiology: Wound Location: Left, Medial Lower Leg Wound Open Wounding Event: Gradually Appeared Status: Date Acquired: 10/29/2022 Comorbid Cataracts, Glaucoma, Lymphedema, Hypertension, Type II Weeks Of Treatment: 0 History: Diabetes, Osteoarthritis Clustered Wound:  No Photos Wound Measurements Length: (cm) 7 Width: (cm) 3.5 Depth: (cm) 0.1 Area: (cm) 19.242 Volume: (cm) 1.924 % Reduction in Area: % Reduction in Volume: Epithelialization:  specify in notes) Date Initiated: 11/25/2022 Target Resolution Date: 12/25/2022 Goal Status: Active Patient/caregiver will verbalize understanding of skin care regimen Date Initiated: 11/25/2022 Target Resolution Date: 12/25/2022 Goal Status: Active Ulcer/skin breakdown will have a volume reduction of 30% by week 4 Date Initiated: 11/25/2022 Target Resolution Date: 12/25/2022 Goal Status: Active Ulcer/skin breakdown will have a volume reduction of 50% by week 8 Date Initiated: 11/25/2022 Target Resolution Date: 01/25/2023 Goal  Status: Active Ulcer/skin breakdown will have a volume reduction of 80% by week 12 Date Initiated: 11/25/2022 Target Resolution Date: 02/25/2023 Goal Status: Active Ulcer/skin breakdown will heal within 14 weeks Date Initiated: 11/25/2022 Target Resolution Date: 03/11/2023 Goal Status: Active Interventions: Assess patient/caregiver ability to obtain necessary supplies Assess patient/caregiver ability to perform ulcer/skin care regimen upon admission and as needed Assess ulceration(s) every visit Provide education on ulcer and skin care Treatment Activities: Skin care regimen initiated : 11/25/2022 Notes: Electronic Signature(s) Signed: 11/25/2022 2:33:22 PM By: Rosalio Loud MSN RN CNS WTA Entered By: Rosalio Loud on 11/25/2022 14:33:22 Jenna [name] ([id number]) [id number] Page 8 of 11 -------------------------------------------------------------------------------- Pain Assessment Details Patient Name: Date of Service: DO Jenna [name] NNE S. 11/25/2022 1:00 PM Medical Record Number: [id number] Patient Account Number: [id number] Date of Birth/Sex: Treating RN: 1931-08-30 (88 y.o. Jenna [name] Primary Care Darius Lundberg: [name], [place] Other Clinician: Referring Zaineb Nowaczyk: Treating Uel Davidow/Extender: Jeri Cos Self, Referral Weeks in Treatment: 0 Active Problems Location of Pain Severity and Description of Pain Patient Has Paino Yes Site Locations Pain Location: Pain in Ulcers With Dressing Change: Yes Rate the pain. Current Pain Level: 9 Worst Pain Level: 10 Least Pain Level: 6 Tolerable Pain Level: 5 Character of Pain Describe the Pain: Aching, Tender, Throbbing Pain Management and Medication Current Pain Management: Medication: Yes Cold Application: No Rest: No Massage: No Activity: No T.E.N.S.: No Heat Application: No Leg drop or elevation: No Is the Current Pain Management Adequate: Inadequate How does your wound impact your  activities of daily livingo Bathing: Yes Work: Yes Engineer, maintenance) Signed: 11/25/2022 3:52:05 PM By: Rosalio Loud MSN RN CNS WTA Entered By: Rosalio Loud on 11/25/2022 13:03:55 -------------------------------------------------------------------------------- Patient/Caregiver Education Details Patient Name: Date of Service: DO Jenna [name] 11/28/2023andnbsp1:00 PM Medical Record Number: [id number] Patient Account Number: [id number] Date of Birth/Gender: Treating RN: 1931/09/05 (88 y.o. Jenna [name] Primary Care Physician: [name], [place] Other Clinician: Referring Physician: Treating Physician/Extender: Jeri Cos Self, Referral Weeks in TreatmentHarlin Heys RHESA, [place] ([id number]) [id number].pdf Page 9 of 11 Education Assessment Education Provided To: Patient Education Topics Provided Wound/Skin Impairment: Handouts: Caring for Your Ulcer Methods: Explain/Verbal Responses: State content correctly Electronic Signature(s) Signed: 11/25/2022 3:52:05 PM By: Rosalio Loud MSN RN CNS WTA Entered By: Rosalio Loud on 11/25/2022 14:36:14 -------------------------------------------------------------------------------- Wound Assessment Details Patient Name: Date of Service: DO Jenna [name] NNE S. 11/25/2022 1:00 PM Medical Record Number: [id number] Patient Account Number: [id number] Date of Birth/Sex: Treating RN: 03-19-1931 (88 y.o. Jenna [name] Primary Care Journiee Feldkamp: [name], [place] Other Clinician: Referring Hughey Rittenberry: Treating Cailah Reach/Extender: Jeri Cos Self, Referral Weeks in Treatment: 0 Wound Status Wound Number: 6 Primary Diabetic Wound/Ulcer of the Lower Extremity Etiology: Wound Location: Left, Medial Lower Leg Wound Open Wounding Event: Gradually Appeared Status: Date Acquired: 10/29/2022 Comorbid Cataracts, Glaucoma, Lymphedema, Hypertension, Type II Weeks Of Treatment: 0 History: Diabetes, Osteoarthritis Clustered Wound:  No Photos Wound Measurements Length: (cm) 7 Width: (cm) 3.5 Depth: (cm) 0.1 Area: (cm) 19.242 Volume: (cm) 1.924 % Reduction in Area: % Reduction in Volume: Epithelialization:  specify in notes) Date Initiated: 11/25/2022 Target Resolution Date: 12/25/2022 Goal Status: Active Patient/caregiver will verbalize understanding of skin care regimen Date Initiated: 11/25/2022 Target Resolution Date: 12/25/2022 Goal Status: Active Ulcer/skin breakdown will have a volume reduction of 30% by week 4 Date Initiated: 11/25/2022 Target Resolution Date: 12/25/2022 Goal Status: Active Ulcer/skin breakdown will have a volume reduction of 50% by week 8 Date Initiated: 11/25/2022 Target Resolution Date: 01/25/2023 Goal  Status: Active Ulcer/skin breakdown will have a volume reduction of 80% by week 12 Date Initiated: 11/25/2022 Target Resolution Date: 02/25/2023 Goal Status: Active Ulcer/skin breakdown will heal within 14 weeks Date Initiated: 11/25/2022 Target Resolution Date: 03/11/2023 Goal Status: Active Interventions: Assess patient/caregiver ability to obtain necessary supplies Assess patient/caregiver ability to perform ulcer/skin care regimen upon admission and as needed Assess ulceration(s) every visit Provide education on ulcer and skin care Treatment Activities: Skin care regimen initiated : 11/25/2022 Notes: Electronic Signature(s) Signed: 11/25/2022 2:33:22 PM By: Rosalio Loud MSN RN CNS WTA Entered By: Rosalio Loud on 11/25/2022 14:33:22 Jenna [name] ([id number]) [id number].pdf Page 8 of 11 -------------------------------------------------------------------------------- Pain Assessment Details Patient Name: Date of Service: DO Jenna [name] NNE S. 11/25/2022 1:00 PM Medical Record Number: [id number] Patient Account Number: [id number] Date of Birth/Sex: Treating RN: 1931-08-30 (88 y.o. Jenna [name] Primary Care Darius Lundberg: [name], [place] Other Clinician: Referring Zaineb Nowaczyk: Treating Uel Davidow/Extender: Jeri Cos Self, Referral Weeks in Treatment: 0 Active Problems Location of Pain Severity and Description of Pain Patient Has Paino Yes Site Locations Pain Location: Pain in Ulcers With Dressing Change: Yes Rate the pain. Current Pain Level: 9 Worst Pain Level: 10 Least Pain Level: 6 Tolerable Pain Level: 5 Character of Pain Describe the Pain: Aching, Tender, Throbbing Pain Management and Medication Current Pain Management: Medication: Yes Cold Application: No Rest: No Massage: No Activity: No T.E.N.S.: No Heat Application: No Leg drop or elevation: No Is the Current Pain Management Adequate: Inadequate How does your wound impact your  activities of daily livingo Bathing: Yes Work: Yes Engineer, maintenance) Signed: 11/25/2022 3:52:05 PM By: Rosalio Loud MSN RN CNS WTA Entered By: Rosalio Loud on 11/25/2022 13:03:55 -------------------------------------------------------------------------------- Patient/Caregiver Education Details Patient Name: Date of Service: DO Jenna [name] 11/28/2023andnbsp1:00 PM Medical Record Number: [id number] Patient Account Number: [id number] Date of Birth/Gender: Treating RN: 1931/09/05 (88 y.o. Jenna [name] Primary Care Physician: [name], [place] Other Clinician: Referring Physician: Treating Physician/Extender: Jeri Cos Self, Referral Weeks in TreatmentHarlin Heys RHESA, [place] ([id number]) [id number].pdf Page 9 of 11 Education Assessment Education Provided To: Patient Education Topics Provided Wound/Skin Impairment: Handouts: Caring for Your Ulcer Methods: Explain/Verbal Responses: State content correctly Electronic Signature(s) Signed: 11/25/2022 3:52:05 PM By: Rosalio Loud MSN RN CNS WTA Entered By: Rosalio Loud on 11/25/2022 14:36:14 -------------------------------------------------------------------------------- Wound Assessment Details Patient Name: Date of Service: DO Jenna [name] NNE S. 11/25/2022 1:00 PM Medical Record Number: [id number] Patient Account Number: [id number] Date of Birth/Sex: Treating RN: 03-19-1931 (88 y.o. Jenna [name] Primary Care Journiee Feldkamp: [name], [place] Other Clinician: Referring Hughey Rittenberry: Treating Cailah Reach/Extender: Jeri Cos Self, Referral Weeks in Treatment: 0 Wound Status Wound Number: 6 Primary Diabetic Wound/Ulcer of the Lower Extremity Etiology: Wound Location: Left, Medial Lower Leg Wound Open Wounding Event: Gradually Appeared Status: Date Acquired: 10/29/2022 Comorbid Cataracts, Glaucoma, Lymphedema, Hypertension, Type II Weeks Of Treatment: 0 History: Diabetes, Osteoarthritis Clustered Wound:  No Photos Wound Measurements Length: (cm) 7 Width: (cm) 3.5 Depth: (cm) 0.1 Area: (cm) 19.242 Volume: (cm) 1.924 % Reduction in Area: % Reduction in Volume: Epithelialization:  Small (1-33%) Wound Description Classification: Grade 2 Exudate Amount: Medium Exudate Type: Serous CHRISTNA, KULICK ([id number]) Exudate Color: Foul Odor After Cleansing: No Slough/Fibrino No [id number].pdf Page 10 of 11 amber Wound Bed Granulation Amount: Small (1-33%) Exposed Structure Granulation Quality: Red Fascia Exposed: No Necrotic Amount: Small (1-33%) Fat Layer (Subcutaneous Tissue) Exposed: Yes Necrotic Quality: Adherent Slough Tendon Exposed: No Muscle Exposed: No Joint Exposed: No Bone Exposed: No Treatment Notes Wound #6 (Lower Leg) Wound Laterality: Left, Medial Cleanser Peri-Wound Care AandD Ointment Discharge Instruction: Apply AandD Ointment as directed Topical Primary Dressing Silvercel 4 1/4x 4 1/4 (in/in) Discharge Instruction: Apply Silvercel 4 1/4x 4 1/4 (in/in) as instructed Secondary Dressing Zetuvit Plus 4x8 (in/in) Secured With [employer] or Non-Sterile 6-ply 4.5x4 (yd/yd) Discharge Instruction: Apply Kerlix as directed Compression Wrap 3-LAYER WRAP - Profore Lite LF 3 Multilayer Compression Bandaging System Discharge Instruction: Apply 3 multi-layer wrap as prescribed. Compression Stockings Add-Ons Electronic Signature(s) Signed: 11/25/2022 3:52:05 PM By: Rosalio Loud MSN RN CNS WTA Entered By: Rosalio Loud on 11/25/2022 13:30:51 -------------------------------------------------------------------------------- Vitals Details Patient Name: Date of Service: DO Jenna [name] NNE S. 11/25/2022 1:00 PM Medical Record Number: [id number] Patient Account Number: [id number] Date of Birth/Sex: Treating RN: June 18, 1931 (88 y.o. Jenna [name] Primary Care Jakyria Bleau: [name], [place] Other Clinician: Referring Lash Matulich: Treating  Ragena Fiola/Extender: Jeri Cos Self, Referral Weeks in Treatment: 0 Vital Signs Time Taken: 13:03 Temperature (F): 98.0 Height (in): 60 Pulse (bpm): 71 Source: Stated Respiratory Rate (breaths/min): 18 Weight (lbs): 200 Blood Pressure (mmHg): 180/92 Source: Stated Reference Range: 80 - 120 mg / dl ZHANNA, [name] ([id number]) [id number].pdf Page 11 of 11 Body Mass Index (BMI): 39.1 Electronic Signature(s) Signed: 11/25/2022 3:52:05 PM By: Rosalio Loud MSN RN CNS WTA Entered By: Rosalio Loud on 11/25/2022 13:06:29

## 2022-11-25 NOTE — Progress Notes (Signed)
Jenna [name], Jenna [name] ([id number]) [id number] Page 1 of 12 Visit Report for 11/25/2022 Chief Complaint Document Details Patient Name: Date of Service: DO Jenna [name] NNE S. 11/25/2022 1:00 PM Medical Record Number: [id number] Patient Account Number: [id number] Date of Birth/Sex: Treating RN: 1931-07-24 (88 y.o. [name] Primary Care Provider: Dorthy [name], Jenna [name] Other Clinician: Referring Provider: Treating Provider/Extender: Jeri Cos Self, Referral Weeks in Treatment: 0 Information Obtained from: Patient Chief Complaint Left LE Ulcer Electronic Signature(s) Signed: 11/25/2022 3:52:05 PM By: Rosalio Loud MSN RN CNS WTA Signed: 11/25/2022 4:53:03 PM By: Worthy Keeler PA-C Previous Signature: 11/25/2022 1:39:42 PM Version By: Worthy Keeler PA-C Entered By: Rosalio Loud on 11/25/2022 14:12:57 -------------------------------------------------------------------------------- Debridement Details Patient Name: Date of Service: DO Jenna [name] NNE S. 11/25/2022 1:00 PM Medical Record Number: [id number] Patient Account Number: [id number] Date of Birth/Sex: Treating RN: 11/25/1931 (88 y.o. [name] Primary Care Provider: Dorthy [name], Jenna [name] Other Clinician: Referring Provider: Treating Provider/Extender: Jeri Cos Self, Referral Weeks in Treatment: 0 Debridement Performed for Assessment: Wound #6 Left,Medial Lower Leg Performed By: Physician Tommie Sams., PA-C Debridement Type: Chemical/Enzymatic/Mechanical Agent Used: saline gauze Severity of Tissue Pre Debridement: Fat layer exposed Level of Consciousness (Pre-procedure): Awake and Alert Pre-procedure Verification/Time Out Yes - 13:43 Taken: Start Time: 13:43 Pain Control: Lidocaine 4% Topical Solution Instrument: Other : gauze Bleeding: Minimum Hemostasis Achieved: Pressure Response to Treatment: Procedure was tolerated well Level of Consciousness (Post- Awake and Alert procedure): Jenna [name], Jenna [name] ([id number]) [id number] Page 2 of 12 Post Debridement Measurements of Total Wound Length: (cm) 7 Width: (cm) 3.5 Depth: (cm) 0.1 Volume: (cm) 1.924 Character of Wound/Ulcer Post Debridement: Stable Severity of Tissue Post Debridement: Fat layer exposed Post Procedure Diagnosis Same as Pre-procedure Electronic Signature(s) Signed: 11/25/2022 3:52:05 PM By: Rosalio Loud MSN RN CNS WTA Signed: 11/25/2022 4:53:03 PM By: Worthy Keeler PA-C Entered By: Rosalio Loud on 11/25/2022 13:44:26 -------------------------------------------------------------------------------- HPI Details Patient Name: Date of Service: DO Jenna [name] NNE S. 11/25/2022 1:00 PM Medical Record Number: [id number] Patient Account Number: [id number] Date of Birth/Sex: Treating RN: 05-Jul-1931 (88 y.o. [name] Primary Care Provider: Dorthy [name], Jenna [name] Other Clinician: Referring Provider: Treating Provider/Extender: Jeri Cos Self, Referral Weeks in Treatment: 0 History of Present Illness Location: left lower extremity in the medial ankle region Quality: Patient reports experiencing a dull pain to affected area(s). Severity: Patient states wound are getting worse. Duration: Patient has had the wound for > 3 months prior to seeking treatment at the wound center Timing: Pain in wound is Intermittent (comes and goes Context: The wound appeared gradually over time Modifying Factors: Other treatment(s) tried include:Jenna [name] admitted to hospital for IV antibiotics for cellulitis ssociated Signs and Symptoms: Patient reports having increase swelling. A HPI Description: 88 year old patient with a past medical history significant for venous stasis ulceration and diabetes mellitus was recently admitted to the hospital between 07/07/2016 and 07/09/2016. She was wound to have a nonpurulent cellulitis of the left lower extremity and was started on IV antibiotics which included vancomycin.  He is discharged home with her and Unna's boots and oral doxycycline. During this admission there was no obvious DVT involving the left lower extremity. her past medical history significant for diabetes mellitus, hypertension, hyperlipidemia, varicose veins, status post right rotator cuff repair, knee surgery on the left,robotic abdominal hysterectomy, laparoscopic cholecystectomy. She is not a smoker. Venous study done on 07/18/2015 showed normal left extremity deep venous system with no evidence of DVT There was extensive valvular incompetence .  and reflux throughout the left greater saphenous vein with direct the medication to subcutaneous varicose veins. Normal left small saphenous vein. I understand she was seen by vascular radiology group and the workup and recommendation was for endovenous ablation but due to family pressure she was not able to keep her appointment a year ago. She has not been wearing her compression stockings regularly. 05/26/18 READMISSION this is an 88 year old woman who was previously seen in this clinic in 2017 by Dr. Con Memos. She has chronic venous insufficiency with lymphedema and at that time had open area on the left medial calf and ankle area. Was recommended that she wear 20-30 mm compression stockings and the patient tells me that she has been compliant with this although I think her primary doctor recently told her that she didn't need to wear stockings out of fear it might cause arterial compression. She noticed edema and pain in the area a week to 2 ago. She saw her primary doctor and was given doxycycline and Silvadene cream to put on the area. She states things are a lot better. The patient has a history of type 2 diabetes on oral agents but she is unaware of her hemoglobin A1c for her blood glucose which she doesn't check. She has hypertension, varicose veins, rotator cuff repair, knee surgery on the left, history of a laparoscopic cholecystectomy, lymphedema,  obstructive sleep apnea, history of endometrial CA. The patient has been to see vein and vascular in the past and I think has had an ablation of the left greater saphenous vein based on ultrasounds of the left leg IC dating back to 2017. Her most recent ultrasound was in May 2018 which showed no evidence of a DVT and continued durable closure of the treated segment of the left greater saphenous vein. Patent lateral accessory branch of the greater saphenous vein extending to the calf ABIs in our clinic were 1.05 on the right and 0.95 on the left 06/02/18 the patient's wound on the left medial lower calf is completely closed and epithelialized. She has new 20-30 mm below-knee stockings Jenna [name], Jenna [name] ([id number]) [id number] Page 3 of 12 READMISSION 11/02/2019 This is a now 88 year old woman who has been in this clinic 2 times before. Most recently in 2019. She has chronic venous insufficiency with some degree of secondary lymphedema. She has had a history of a left greater saphenous vein ablation. When she is here in 2019 she had a wound on the left medial lower calf. This closed fairly easily. It was recommended she wear 20/30 mm below-knee stockings she is not compliant with this. She arrives in clinic with a 2-week history of a left medial lower extremity and ankle wound. Some of this has dry slough on it but most of it is already epithelialize she has been using a combination of Vaseline and/or Silvadene. She is not wearing any compression. She has a history of chronic venous reflux. There was recommendations in the past for ablations I do not know that she ever carried through with this. According to notes from 2016 this work-up was done via the interventional radiology group. We will need to research this. She is probably going to need a consultation ABIs in our clinic were 1.03 on the right and 0.93 on the left 11/11; patient's wound looks as though it is  epithelializing horizontal wound on the left medial lower extremity. She has a superior satellite lesion. She is complaining of a lot of pain in 3 layer compression. There have  been recommendations for previous ablations I am not sure if she followed up on this. She has been noncompliant with stockings although she brought those into the clinic today. 11/18; the patient had a repeat venous reflux studies at vein and vascular. This did not show any DVT in the left lower extremity there was no evidence of chronic venous insufficiency and no evidence of superficial vein thrombosis. I looked back at previous studies done I think in interventional radiology in 2018 would suggest that there had been previous ablation of a segment of the left greater saphenous vein. By review of the new study I do not think the patient needs to see vein and vascular however I find these studies increasingly difficult to interpret. I am not sure that the patient has actually consistently worn compression stockings and I think that is the next step here. The wound is just about closed 11/22/2019 upon evaluation today patient actually appears to be healed based on what I am seeing today. She has been tolerating the dressing changes without complication. Fortunately there is no signs of active infection at this time. Overall I feel like even though this is the first time of seeing her that she has actually done extremely well with the current wound care measures again she has been under the care of Dr. Dellia Nims. 12/2 the patient was expected to be healed this week however she comes in with 3 small wounds that look much the same as the pictures from 2 weeks ago. These are all in the area of the left medial lower extremity. She was put into her own compression stockings last week I do not think the edema control was adequate in this area for healing. We have been using Hydrofera Blue 12/9; wound is fully epithelialized but still  looks vulnerable on the left medial lower extremity. Surrounding venous inflammation. I think she has lymphedema with fibrosed skin in the distal lower leg. [Inverted bottle sign]. She has had venous reflux studies that have not shown any of the superficial veins to be amenable to ablations. This is been repeated during this visit. I am not really convinced that she has been wearing compression stockings although she certainly has them. 12/23; patient's wound on the left medial lower extremity is totally healed. She has surrounding venous inflammation and skin damage related to there is also some degree of lymphedema and fibrosed skin in the distal lower extremity. She has her compression stocking Readmission: 03/25/2021 upon evaluation today patient appears to be doing excellent in regard to her wound all things considered. She does appear to have gotten very quickly this time which is great news compared to before when she tells me she let the wound get somewhat out of control. Nonetheless right now she tells me this has been present for about 3 weeks she has been using Vaseline on it. She does have a history of diabetes though she has not taken Metformin for years she sees her primary care provider on Friday and she will see what he says but she is hoping he would not put her back on this. Subsequently she also does have Lasix that she is not been taking it recently. She has a past medical history significant for venous stasis, lymphedema, diabetes mellitus type 2, and hypertension. 04/01/2021 on evaluation today patient appears to be doing well with regard to her wound. There is no signs of active infection at this time. No fevers, chills, nausea, vomiting, or diarrhea. 04/08/2021 upon evaluation today  patient appears to be doing excellent in regard to her wounds currently. She has been tolerating the dressing changes and in fact appears to be completely healed which is great  news. Readmission: 08/22/2021 patient presents today for reevaluation here in the clinic concerning a reopening of the wound on the left medial ankle/lower leg region which is the same area I took care of earlier in the year between March and April. Fortunately there does not appear to be anything to do a peer which is good news. I do think that as before this can probably heal fatherly rapidly. Nonetheless I do believe that based on what we are seeing she probably needs a compression wrap at this time. Her medical history really is not changed. Her ABI back in March of this year was 1.28 on this left leg and was doing well. 08/29/2021 upon evaluation today patient appears to be doing well. With regard to her wound she is actually showing signs of good granulation. I am very pleased in that regard. There does not appear to be any evidence of active infection at this time. 09/06/2021 upon evaluation today patient's wound actually showing signs of doing very well as far as granulation epithelization is concerned. Fortunately I do not see any need for sharp debridement today and very pleased in that regard. There does seem to be some issue here with still continued drainage but nonetheless I think that the compression wrap is doing a very good job in helping to mitigate this as well. Overall I think that we are headed in an appropriate direction towards getting this completely closed. 09/13/2021 upon evaluation today patient appears to be doing better in regard to the overall measurement today. In fact the measurement really does not speak to as much as this is healed due to the fact that some of the areas that are open are somewhat scattered. In general I am extremely pleased with where we stand today with healing. The patient likewise is also extremely happy with what she is seeing and hopefully will get this closed pretty soon. 09/20/2021 upon evaluation today patient appears to be doing well at this point  with regard to her wound. She has been tolerating the dressing changes without complication. Fortunately there does not appear to be any signs of active infection at this time. In fact I feel like this is probably completely healed. 09/30/2021 upon evaluation today patient actually appears to be doing quite well in regard to her wound. She has been tolerating the dressing changes without complication. Fortunately there does not appear to be any signs of active infection which is great news. No fevers, chills, nausea, vomiting, or diarrhea. Readmission: 12/06/2021 upon evaluation today patient appears to be doing decently well in regard to her left leg. She had previously had an issue here with cellulitis that she did go to an urgent care for and they placed her on doxycycline. The good news is this seems to have gotten much better. She tells me since I last saw her she has been wearing her compression socks and this happened despite that nonetheless. She does not take any fluid pills right now. Fortunately there is no signs of active infection locally nor systemically at this point. Readmission: 11-25-2022 upon evaluation today patient presents for reevaluation here in the clinic although it has been a little bit of time since I last saw her December 06, 2021 almost a year ago. With that being said in the past she is actually responding  well to compression wrap she does have an area on the left medial lower extremity today which is her pretty common place for this to reopen. She was supposed of had a venous ablation on Monday but did not go due to the wound. Subsequently we are going to need to get something going here for her as far as trying to get the area to heal effectively is concerned at this point. In regard to the patient's past medical history nothing has really changed significantly since she was last seeing here in the clinic about a year ago. Jenna [name], Jenna [name] ([id number])  [id number] Page 4 of 12 Electronic Signature(s) Signed: 11/25/2022 1:55:12 PM By: Worthy Keeler PA-C Entered By: Worthy Keeler on 11/25/2022 13:55:12 -------------------------------------------------------------------------------- Physical Exam Details Patient Name: Date of Service: DO Jenna [name] NNE S. 11/25/2022 1:00 PM Medical Record Number: [id number] Patient Account Number: [id number] Date of Birth/Sex: Treating RN: 16-Sep-1931 (88 y.o. [name] Primary Care Provider: Dorthy [name], Jenna [name] Other Clinician: Referring Provider: Treating Provider/Extender: Jeri Cos Self, Referral Weeks in Treatment: 0 Constitutional patient is hypertensive.. pulse regular and within target range for patient.Marland Kitchen respirations regular, non-labored and within target range for patient.Marland Kitchen temperature within target range for patient.. Well-nourished and well-hydrated in no acute distress. Eyes conjunctiva clear no eyelid edema noted. pupils equal round and reactive to light and accommodation. Ears, Nose, Mouth, and Throat no gross abnormality of ear auricles or external auditory canals. normal hearing noted during conversation. mucus membranes moist. Respiratory normal breathing without difficulty. Cardiovascular 2+ dorsalis pedis/posterior tibialis pulses. 1+ pitting edema of the bilateral lower extremities. Musculoskeletal normal gait and posture. no significant deformity or arthritic changes, no loss or range of motion, no clubbing. Psychiatric this patient is able to make decisions and demonstrates good insight into disease process. Alert and Oriented x 3. pleasant and cooperative. Notes Upon inspection patient's wound actually appears to be very superficial which is good news. Fortunately there does not appear to be any signs of infection locally or systemically which is excellent news as well. I do believe that she has been require some compression therapy and I do  believe a 3 layer compression wrap will probably be sufficient she does have definite lymphedema and swelling noted at this time. Her blood pressure is also elevated which probably does not help anything and she is not aware of when she has had a last hemoglobin A1c check. Electronic Signature(s) Signed: 11/25/2022 1:56:07 PM By: Worthy Keeler PA-C Entered By: Worthy Keeler on 11/25/2022 13:56:07 Physician Orders Details -------------------------------------------------------------------------------- Jenna [name] ([id number]) [id number] Page 5 of 12 Patient Name: Date of Service: DO Jenna [name] NNE S. 11/25/2022 1:00 PM Medical Record Number: [id number] Patient Account Number: [id number] Date of Birth/Sex: Treating RN: 1931/10/15 (88 y.o. [name] Primary Care Provider: Dorthy [name], Jenna [name] Other Clinician: Referring Provider: Treating Provider/Extender: Jeri Cos Self, Referral Weeks in Treatment: 0 Verbal / Phone Orders: No Diagnosis Coding ICD-10 Coding Code Description E11.622 Type 2 diabetes mellitus with other skin ulcer I87.332 Chronic venous hypertension (idiopathic) with ulcer and inflammation of left lower extremity I89.0 Lymphedema, not elsewhere classified L97.822 Non-pressure chronic ulcer of other part of left lower leg with fat layer exposed I10 Essential (primary) hypertension Follow-up Appointments Return Appointment in 1 week. Bathing/ Shower/ Hygiene No tub bath. - Do not get wrap wet Anesthetic (Use 'Patient Medications' Section for Anesthetic Order Entry) Lidocaine applied to wound bed Wound Treatment Wound #6 - Lower Leg Wound Laterality: Left,  Medial Peri-Wound Care: AandD Ointment 1 x Per Week/30 Days Discharge Instructions: Apply AandD Ointment as directed Prim Dressing: Silvercel 4 1/4x 4 1/4 (in/in) 1 x Per Week/30 Days ary Discharge Instructions: Apply Silvercel 4 1/4x 4 1/4 (in/in) as instructed Secondary  Dressing: Zetuvit Plus 4x8 (in/in) 1 x Per Week/30 Days Secured With: [employer] Sterile or Non-Sterile 6-ply 4.5x4 (yd/yd) 1 x Per Week/30 Days Discharge Instructions: Apply Kerlix as directed Compression Wrap: 3-LAYER WRAP - Profore Lite LF 3 Multilayer Compression Bandaging System 1 x Per Week/30 Days Discharge Instructions: Apply 3 multi-layer wrap as prescribed. Electronic Signature(s) Signed: 11/25/2022 2:34:44 PM By: Rosalio Loud MSN RN CNS WTA Signed: 11/25/2022 4:53:03 PM By: Worthy Keeler PA-C Entered By: Rosalio Loud on 11/25/2022 14:34:43 -------------------------------------------------------------------------------- Problem List Details Patient Name: Date of Service: DO Jenna [name] NNE S. 11/25/2022 1:00 PM Medical Record Number: [id number] Patient Account Number: [id number] Date of Birth/Sex: Treating RN: 1931/03/07 (88 y.o. [name] Primary Care Provider: Dorthy [name], Jenna [name] Other Clinician: Referring Provider: Treating Provider/Extender: Jeri Cos Self, Referral Weeks in Treatment: [address] Jenna [name], Jenna [name] ([id number]) [id number] Page 6 of 12 Active Problems ICD-10 Encounter Code Description Active Date MDM Diagnosis E11.622 Type 2 diabetes mellitus with other skin ulcer 11/25/2022 No Yes I87.332 Chronic venous hypertension (idiopathic) with ulcer and inflammation of left 11/25/2022 No Yes lower extremity I89.0 Lymphedema, not elsewhere classified 11/25/2022 No Yes L97.822 Non-pressure chronic ulcer of other part of left lower leg with fat layer exposed11/28/2023 No Yes I10 Essential (primary) hypertension 11/25/2022 No Yes Inactive Problems Resolved Problems Electronic Signature(s) Signed: 11/25/2022 3:52:05 PM By: Rosalio Loud MSN RN CNS WTA Signed: 11/25/2022 4:53:03 PM By: Worthy Keeler PA-C Previous Signature: 11/25/2022 1:39:14 PM Version By: Worthy Keeler PA-C Entered By: Rosalio Loud on 11/25/2022  14:12:34 -------------------------------------------------------------------------------- Progress Note Details Patient Name: Date of Service: DO Jenna [name] NNE S. 11/25/2022 1:00 PM Medical Record Number: [id number] Patient Account Number: [id number] Date of Birth/Sex: Treating RN: September 13, 1931 (88 y.o. [name] Primary Care Provider: Dorthy [name], Jenna [name] Other Clinician: Referring Provider: Treating Provider/Extender: Jeri Cos Self, Referral Weeks in Treatment: 0 Subjective Chief Complaint Information obtained from Patient Left LE Ulcer History of Present Illness (HPI) The following HPI elements were documented for the patient's wound: Location: left lower extremity in the medial ankle region Quality: Patient reports experiencing a dull pain to affected area(s). Severity: Patient states wound are getting worse. Duration: Patient has had the wound for > 3 months prior to seeking treatment at the wound center Timing: Pain in wound is Intermittent (comes and goes Context: The wound appeared gradually over time Modifying Factors: Other treatment(s) tried include:Jenna [name] admitted to hospital for IV antibiotics for cellulitis Associated Signs and Symptoms: Patient reports having increase swelling. SHANAE, LUO ([id number]) [id number] Page 23 of 62 88 year old patient with a past medical history significant for venous stasis ulceration and diabetes mellitus was recently admitted to the hospital between 07/07/2016 and 07/09/2016. She was wound to have a nonpurulent cellulitis of the left lower extremity and was started on IV antibiotics which included vancomycin. He is discharged home with her and Unna's boots and oral doxycycline. During this admission there was no obvious DVT involving the left lower extremity. her past medical history significant for diabetes mellitus, hypertension, hyperlipidemia, varicose veins, status post right rotator cuff repair,  knee surgery on the left,robotic abdominal hysterectomy, laparoscopic cholecystectomy. She is not a smoker. Venous study done on 07/18/2015 showed normal left extremity deep venous system  with no evidence of DVT There was extensive valvular incompetence . and reflux throughout the left greater saphenous vein with direct the medication to subcutaneous varicose veins. Normal left small saphenous vein. I understand she was seen by vascular radiology group and the workup and recommendation was for endovenous ablation but due to family pressure she was not able to keep her appointment a year ago. She has not been wearing her compression stockings regularly. 05/26/18 READMISSION this is an 88 year old woman who was previously seen in this clinic in 2017 by Dr. Con Memos. She has chronic venous insufficiency with lymphedema and at that time had open area on the left medial calf and ankle area. Was recommended that she wear 20-30 mm compression stockings and the patient tells me that she has been compliant with this although I think her primary doctor recently told her that she didn't need to wear stockings out of fear it might cause arterial compression. She noticed edema and pain in the area a week to 2 ago. She saw her primary doctor and was given doxycycline and Silvadene cream to put on the area. She states things are a lot better. The patient has a history of type 2 diabetes on oral agents but she is unaware of her hemoglobin A1c for her blood glucose which she doesn't check. She has hypertension, varicose veins, rotator cuff repair, knee surgery on the left, history of a laparoscopic cholecystectomy, lymphedema, obstructive sleep apnea, history of endometrial CA. The patient has been to see vein and vascular in the past and I think has had an ablation of the left greater saphenous vein based on ultrasounds of the left leg IC dating back to 2017. Her most recent ultrasound was in May 2018 which showed no  evidence of a DVT and continued durable closure of the treated segment of the left greater saphenous vein. Patent lateral accessory branch of the greater saphenous vein extending to the calf ABIs in our clinic were 1.05 on the right and 0.95 on the left 06/02/18 the patient's wound on the left medial lower calf is completely closed and epithelialized. She has new 20-30 mm below-knee stockings READMISSION 11/02/2019 This is a now 88 year old woman who has been in this clinic 2 times before. Most recently in 2019. She has chronic venous insufficiency with some degree of secondary lymphedema. She has had a history of a left greater saphenous vein ablation. When she is here in 2019 she had a wound on the left medial lower calf. This closed fairly easily. It was recommended she wear 20/30 mm below-knee stockings she is not compliant with this. She arrives in clinic with a 2-week history of a left medial lower extremity and ankle wound. Some of this has dry slough on it but most of it is already epithelialize she has been using a combination of Vaseline and/or Silvadene. She is not wearing any compression. She has a history of chronic venous reflux. There was recommendations in the past for ablations I do not know that she ever carried through with this. According to notes from 2016 this work-up was done via the interventional radiology group. We will need to research this. She is probably going to need a consultation ABIs in our clinic were 1.03 on the right and 0.93 on the left 11/11; patient's wound looks as though it is epithelializing horizontal wound on the left medial lower extremity. She has a superior satellite lesion. She is complaining of a lot of pain in 3 layer compression. There  have been recommendations for previous ablations I am not sure if she followed up on this. She has been noncompliant with stockings although she brought those into the clinic today. 11/18; the patient had a repeat  venous reflux studies at vein and vascular. This did not show any DVT in the left lower extremity there was no evidence of chronic venous insufficiency and no evidence of superficial vein thrombosis. I looked back at previous studies done I think in interventional radiology in 2018 would suggest that there had been previous ablation of a segment of the left greater saphenous vein. By review of the new study I do not think the patient needs to see vein and vascular however I find these studies increasingly difficult to interpret. I am not sure that the patient has actually consistently worn compression stockings and I think that is the next step here. The wound is just about closed 11/22/2019 upon evaluation today patient actually appears to be healed based on what I am seeing today. She has been tolerating the dressing changes without complication. Fortunately there is no signs of active infection at this time. Overall I feel like even though this is the first time of seeing her that she has actually done extremely well with the current wound care measures again she has been under the care of Dr. Dellia Nims. 12/2 the patient was expected to be healed this week however she comes in with 3 small wounds that look much the same as the pictures from 2 weeks ago. These are all in the area of the left medial lower extremity. She was put into her own compression stockings last week I do not think the edema control was adequate in this area for healing. We have been using Hydrofera Blue 12/9; wound is fully epithelialized but still looks vulnerable on the left medial lower extremity. Surrounding venous inflammation. I think she has lymphedema with fibrosed skin in the distal lower leg. [Inverted bottle sign]. She has had venous reflux studies that have not shown any of the superficial veins to be amenable to ablations. This is been repeated during this visit. I am not really convinced that she has been wearing  compression stockings although she certainly has them. 12/23; patient's wound on the left medial lower extremity is totally healed. She has surrounding venous inflammation and skin damage related to there is also some degree of lymphedema and fibrosed skin in the distal lower extremity. She has her compression stocking Readmission: 03/25/2021 upon evaluation today patient appears to be doing excellent in regard to her wound all things considered. She does appear to have gotten very quickly this time which is great news compared to before when she tells me she let the wound get somewhat out of control. Nonetheless right now she tells me this has been present for about 3 weeks she has been using Vaseline on it. She does have a history of diabetes though she has not taken Metformin for years she sees her primary care provider on Friday and she will see what he says but she is hoping he would not put her back on this. Subsequently she also does have Lasix that she is not been taking it recently. She has a past medical history significant for venous stasis, lymphedema, diabetes mellitus type 2, and hypertension. 04/01/2021 on evaluation today patient appears to be doing well with regard to her wound. There is no signs of active infection at this time. No fevers, chills, nausea, vomiting, or diarrhea. 04/08/2021 upon  evaluation today patient appears to be doing excellent in regard to her wounds currently. She has been tolerating the dressing changes and in fact appears to be completely healed which is great news. Readmission: 08/22/2021 patient presents today for reevaluation here in the clinic concerning a reopening of the wound on the left medial ankle/lower leg region which is the same area I took care of earlier in the year between March and April. Fortunately there does not appear to be anything to do a peer which is good news. I do think that as before this can probably heal fatherly rapidly.  Nonetheless I do believe that based on what we are seeing she probably needs a compression wrap at this time. Her medical history really is not changed. Her ABI back in March of this year was 1.28 on this left leg and was doing well. 08/29/2021 upon evaluation today patient appears to be doing well. With regard to her wound she is actually showing signs of good granulation. I am very pleased in that regard. There does not appear to be any evidence of active infection at this time. Jenna [name], Jenna [name] ([id number]) [id number] Page 8 of 12 09/06/2021 upon evaluation today patient's wound actually showing signs of doing very well as far as granulation epithelization is concerned. Fortunately I do not see any need for sharp debridement today and very pleased in that regard. There does seem to be some issue here with still continued drainage but nonetheless I think that the compression wrap is doing a very good job in helping to mitigate this as well. Overall I think that we are headed in an appropriate direction towards getting this completely closed. 09/13/2021 upon evaluation today patient appears to be doing better in regard to the overall measurement today. In fact the measurement really does not speak to as much as this is healed due to the fact that some of the areas that are open are somewhat scattered. In general I am extremely pleased with where we stand today with healing. The patient likewise is also extremely happy with what she is seeing and hopefully will get this closed pretty soon. 09/20/2021 upon evaluation today patient appears to be doing well at this point with regard to her wound. She has been tolerating the dressing changes without complication. Fortunately there does not appear to be any signs of active infection at this time. In fact I feel like this is probably completely healed. 09/30/2021 upon evaluation today patient actually appears to be doing quite well in  regard to her wound. She has been tolerating the dressing changes without complication. Fortunately there does not appear to be any signs of active infection which is great news. No fevers, chills, nausea, vomiting, or diarrhea. Readmission: 12/06/2021 upon evaluation today patient appears to be doing decently well in regard to her left leg. She had previously had an issue here with cellulitis that she did go to an urgent care for and they placed her on doxycycline. The good news is this seems to have gotten much better. She tells me since I last saw her she has been wearing her compression socks and this happened despite that nonetheless. She does not take any fluid pills right now. Fortunately there is no signs of active infection locally nor systemically at this point. Readmission: 11-25-2022 upon evaluation today patient presents for reevaluation here in the clinic although it has been a little bit of time since I last saw her December 06, 2021 almost a year ago.  With that being said in the past she is actually responding well to compression wrap she does have an area on the left medial lower extremity today which is her pretty common place for this to reopen. She was supposed of had a venous ablation on Monday but did not go due to the wound. Subsequently we are going to need to get something going here for her as far as trying to get the area to heal effectively is concerned at this point. In regard to the patient's past medical history nothing has really changed significantly since she was last seeing here in the clinic about a year ago. Patient History Information obtained from Patient. Allergies Shellfish Containing Products (Severity: Severe, Reaction: anaphlaxsis) Family History Cancer - Siblings, Diabetes - Siblings, Hypertension - Maternal Grandparents,Paternal Grandparents,Mother,Father,Siblings, Lung Disease - Siblings, No family history of Heart Disease, Hereditary Spherocytosis,  Kidney Disease, Seizures, Stroke, Thyroid Problems, Tuberculosis. Social History Never smoker, Marital Status - Married, Alcohol Use - Never, Drug Use - No History, Caffeine Use - Daily - coffee. Medical History Eyes Patient has history of Cataracts - surgery, Glaucoma - left Ear/Nose/Mouth/Throat Denies history of Chronic sinus problems/congestion, Middle ear problems Hematologic/Lymphatic Patient has history of Lymphedema Denies history of Anemia, Hemophilia, Human Immunodeficiency Virus, Sickle Cell Disease Respiratory Denies history of Aspiration, Asthma, Chronic Obstructive Pulmonary Disease (COPD), Pneumothorax, Sleep Apnea, Tuberculosis Cardiovascular Patient has history of Hypertension Denies history of Angina, Arrhythmia, Congestive Heart Failure, Coronary Artery Disease, Deep Vein Thrombosis, Hypotension, Myocardial Infarction, Peripheral Arterial Disease, Peripheral Venous Disease, Phlebitis, Vasculitis Gastrointestinal Denies history of Cirrhosis , Colitis, Crohnoos, Hepatitis A, Hepatitis B, Hepatitis C Endocrine Patient has history of Type II Diabetes Genitourinary Denies history of End Stage Renal Disease Immunological Denies history of Lupus Erythematosus, Raynaudoos, Scleroderma Integumentary (Skin) Denies history of History of Burn, History of pressure wounds Musculoskeletal Patient has history of Osteoarthritis Denies history of Gout, Rheumatoid Arthritis, Osteomyelitis Neurologic Denies history of Dementia, Neuropathy, Quadriplegia, Paraplegia, Seizure Disorder Oncologic Denies history of Received Chemotherapy, Received Radiation Psychiatric Denies history of Hollace Hay[place], Confinement Anxiety Objective Jenna [name], Jenna [name] ([id number]) [id number] Page 9 of 12 Constitutional patient is hypertensive.. pulse regular and within target range for patient.Marland Kitchen respirations regular, non-labored and within target range for patient.Marland Kitchen  temperature within target range for patient.. Well-nourished and well-hydrated in no acute distress. Vitals Time Taken: 1:03 PM, Height: 60 in, Source: Stated, Weight: 200 lbs, Source: Stated, BMI: 39.1, Temperature: 98.0 F, Pulse: 71 bpm, Respiratory Rate: 18 breaths/min, Blood Pressure: 180/92 mmHg. Eyes conjunctiva clear no eyelid edema noted. pupils equal round and reactive to light and accommodation. Ears, Nose, Mouth, and Throat no gross abnormality of ear auricles or external auditory canals. normal hearing noted during conversation. mucus membranes moist. Respiratory normal breathing without difficulty. Cardiovascular 2+ dorsalis pedis/posterior tibialis pulses. 1+ pitting edema of the bilateral lower extremities. Musculoskeletal normal gait and posture. no significant deformity or arthritic changes, no loss or range of motion, no clubbing. Psychiatric this patient is able to make decisions and demonstrates good insight into disease process. Alert and Oriented x 3. pleasant and cooperative. General Notes: Upon inspection patient's wound actually appears to be very superficial which is good news. Fortunately there does not appear to be any signs of infection locally or systemically which is excellent news as well. I do believe that she has been require some compression therapy and I do believe a 3 layer compression wrap will probably be sufficient she does have definite lymphedema and swelling noted at this  time. Her blood pressure is also elevated which probably does not help anything and she is not aware of when she has had a last hemoglobin A1c check. Integumentary (Hair, Skin) Wound #6 status is Open. Original cause of wound was Gradually Appeared. The date acquired was: 10/29/2022. The wound is located on the Left,Medial Lower Leg. The wound measures 7cm length x 3.5cm width x 0.1cm depth; 19.242cm^2 area and 1.924cm^3 volume. There is Fat Layer (Subcutaneous Tissue) exposed.  There is a medium amount of serous drainage noted. There is small (1-33%) red granulation within the wound bed. There is a small (1-33%) amount of necrotic tissue within the wound bed including Adherent Slough. Assessment Active Problems ICD-10 Type 2 diabetes mellitus with other skin ulcer Chronic venous hypertension (idiopathic) with ulcer and inflammation of left lower extremity Lymphedema, not elsewhere classified Non-pressure chronic ulcer of other part of left lower leg with fat layer exposed Essential (primary) hypertension Procedures Wound #6 Pre-procedure diagnosis of Wound #6 is a Diabetic Wound/Ulcer of the Lower Extremity located on the Left,Medial Lower Leg .Severity of Tissue Pre Debridement is: Fat layer exposed. There was a Chemical/Enzymatic/Mechanical debridement performed by Tommie Sams., PA-C. With the following instrument(s): gauze after achieving pain control using Lidocaine 4% Topical Solution. Other agent used was saline gauze. A time out was conducted at 13:43, prior to the start of the procedure. A Minimum amount of bleeding was controlled with Pressure. The procedure was tolerated well. Post Debridement Measurements: 7cm length x 3.5cm width x 0.1cm depth; 1.924cm^3 volume. Character of Wound/Ulcer Post Debridement is stable. Severity of Tissue Post Debridement is: Fat layer exposed. Post procedure Diagnosis Wound #6: Same as Pre-Procedure Plan 1. Based on what I see I do believe that the patient would benefit from a recommendation for initiating compression therapy I think a 3 layer compression wrap would be ideal. 2. I am also going to recommend that the patient should continue to monitor for any evidence of infection or worsening in general if anything changes she knows to contact the office and let me know. 3. Year we are going to initiate treatment specifically with a silver alginate dressing using some AandD ointment around the edges of the wound. We will  see patient back for reevaluation in 1 week here in the clinic. If anything worsens or changes patient will contact our office for additional recommendations. MELLANY, DINSMORE ([id number]) [id number] Page 10 of 12 Electronic Signature(s) Signed: 11/25/2022 1:57:36 PM By: Worthy Keeler PA-C Entered By: Worthy Keeler on 11/25/2022 13:57:35 -------------------------------------------------------------------------------- ROS/PFSH Details Patient Name: Date of Service: DO Jenna [name] NNE S. 11/25/2022 1:00 PM Medical Record Number: [id number] Patient Account Number: [id number] Date of Birth/Sex: Treating RN: May 15, 1931 (88 y.o. [name] Primary Care Provider: Dorthy [name], Jenna [name] Other Clinician: Referring Provider: Treating Provider/Extender: Jeri Cos Self, Referral Weeks in Treatment: 0 Information Obtained From Patient Eyes Medical History: Positive for: Cataracts - surgery; Glaucoma - left Ear/Nose/Mouth/Throat Medical History: Negative for: Chronic sinus problems/congestion; Middle ear problems Hematologic/Lymphatic Medical History: Positive for: Lymphedema Negative for: Anemia; Hemophilia; Human Immunodeficiency Virus; Sickle Cell Disease Respiratory Medical History: Negative for: Aspiration; Asthma; Chronic Obstructive Pulmonary Disease (COPD); Pneumothorax; Sleep Apnea; Tuberculosis Cardiovascular Medical History: Positive for: Hypertension Negative for: Angina; Arrhythmia; Congestive Heart Failure; Coronary Artery Disease; Deep Vein Thrombosis; Hypotension; Myocardial Infarction; Peripheral Arterial Disease; Peripheral Venous Disease; Phlebitis; Vasculitis Gastrointestinal Medical History: Negative for: Cirrhosis ; Colitis; Crohns; Hepatitis A; Hepatitis B; Hepatitis C Endocrine Medical History: Positive for: Type  II Diabetes Time with diabetes: 18 years Treated with: Diet Blood sugar tested every day: No Genitourinary Medical  History: Negative for: End Stage Renal Disease Immunological ARTRICE, KRAKER ([id number]) [id number] Page 11 of 12 Medical History: Negative for: Lupus Erythematosus; Raynauds; Scleroderma Integumentary (Skin) Medical History: Negative for: History of Burn; History of pressure wounds Musculoskeletal Medical History: Positive for: Osteoarthritis Negative for: Gout; Rheumatoid Arthritis; Osteomyelitis Neurologic Medical History: Negative for: Dementia; Neuropathy; Quadriplegia; Paraplegia; Seizure Disorder Oncologic Medical History: Negative for: Received Chemotherapy; Received Radiation Psychiatric Medical History: Negative for: Anorexia/bulimia; Confinement Anxiety HBO Extended History Items Eyes: Eyes: Cataracts Glaucoma Immunizations Pneumococcal Vaccine: Received Pneumococcal Vaccination: Yes Received Pneumococcal Vaccination On or After 60th Birthday: Yes Implantable Devices None Family and Social History Cancer: Yes - Siblings; Diabetes: Yes - Siblings; Heart Disease: No; Hereditary Spherocytosis: No; Hypertension: Yes - Maternal Grandparents,Paternal Grandparents,Mother,Father,Siblings; Kidney Disease: No; Lung Disease: Yes - Siblings; Seizures: No; Stroke: No; Thyroid Problems: No; Tuberculosis: No; Never smoker; Marital Status - Married; Alcohol Use: Never; Drug Use: No History; Caffeine Use: Daily - coffee; Financial Concerns: No; Food, Clothing or Shelter Needs: No; Support System Lacking: No; Transportation Concerns: No Electronic Signature(s) Signed: 11/25/2022 3:52:05 PM By: Rosalio Loud MSN RN CNS WTA Signed: 11/25/2022 4:53:03 PM By: Worthy Keeler PA-C Entered By: Rosalio Loud on 11/25/2022 13:31:27 -------------------------------------------------------------------------------- SuperBill Details Patient Name: Date of Service: DO Jenna [name] NNE S. 11/25/2022 Medical Record Number: [id number] Patient Account Number:  [id number] Date of Birth/Sex: Treating RN: 01-18-1931 (88 y.o. [name] Primary Care Provider: Dorthy [name], Jenna [name] Other Clinician: Referring Provider: Treating Provider/Extender: Jeri Cos Self, Referral Weeks in Treatment: [address] AMIJAH, TIMOTHY ([id number]) [id number] Page 12 of 12 Diagnosis Coding ICD-10 Codes Code Description E11.622 Type 2 diabetes mellitus with other skin ulcer I87.332 Chronic venous hypertension (idiopathic) with ulcer and inflammation of left lower extremity I89.0 Lymphedema, not elsewhere classified L97.822 Non-pressure chronic ulcer of other part of left lower leg with fat layer exposed I10 Essential (primary) hypertension Facility Procedures : CPT4 Code: 42706237 Description: 62831 - WOUND CARE VISIT-LEV 4 EST PT Modifier: Quantity: 1 : CPT4 Code: [id number] Description: 37106 - DEBRIDE W/O ANES NON SELECT Modifier: Quantity: 1 Physician Procedures : CPT4 Code Description Modifier [id number] 62703 - WC PHYS LEVEL 4 - EST PT ICD-10 Diagnosis Description E11.622 Type 2 diabetes mellitus with other skin ulcer I87.332 Chronic venous hypertension (idiopathic) with ulcer and inflammation of left lower  extremity I89.0 Lymphedema, not elsewhere classified L97.822 Non-pressure chronic ulcer of other part of left lower leg with fat layer exposed Quantity: 1 Electronic Signature(s) Signed: 11/25/2022 2:36:07 PM By: Rosalio Loud MSN RN CNS WTA Signed: 11/25/2022 4:53:03 PM By: Worthy Keeler PA-C Previous Signature: 11/25/2022 2:07:30 PM Version By: Worthy Keeler PA-C Entered By: Rosalio Loud on 11/25/2022 14:36:06

## 2022-11-25 NOTE — Progress Notes (Signed)
Jenna [name], Jenna [name] ([id number]) [phone] [email or web address] Page 1 of 5 Visit Report for 11/25/2022 Abuse Risk Screen Details Patient Name: Date of Service: DO Jenna [name] NNE S. 11/25/2022 1:00 PM Medical Record Number: [id number] Patient Account Number: [id number] Date of Birth/Sex: Treating RN: Jul 09, 1931 (88 y.o. Jenna [name] Primary Care Helaina Stefano: Docia Chuck, Dibas Other Clinician: Referring Zacherie Honeyman: Treating Jaquise Faux/Extender: Allen Derry Self, Referral Weeks in Treatment: 0 Abuse Risk Screen Items Answer Electronic Signature(s) Signed: 11/25/2022 3:52:05 PM By: Midge Aver MSN RN CNS WTA Entered By: Midge Aver on 11/25/2022 13:31:31 -------------------------------------------------------------------------------- Activities of Daily Living Details Patient Name: Date of Service: DO Jenna [name] NNE S. 11/25/2022 1:00 PM Medical Record Number: [id number] Patient Account Number: [id number] Date of Birth/Sex: Treating RN: Oct 25, 1931 (88 y.o. Jenna [name] Primary Care Abbie Jablon: Docia Chuck, Dibas Other Clinician: Referring Tanazia Achee: Treating Seraj Dunnam/Extender: Allen Derry Self, Referral Weeks in Treatment: 0 Activities of Daily Living Items Answer Activities of Daily Living (Please select one for each item) Drive Automobile Not Able T Medications ake Completely Able Use T elephone Completely Able Care for Appearance Completely Able Use T oilet Completely Able Bath / Shower Completely Able Dress Self Completely Able Feed Self Completely Able Walk Need Assistance Get In / Out Bed Completely Able Housework Need Assistance Prepare Meals Need Assistance Handle Money Completely Able Shop for Self Need Assistance Jenna [name], Jenna [name] ([id number]) [phone] [email or web address] Page 2 of 5 Electronic Signature(s) Signed: 11/25/2022 3:52:05 PM By: Midge Aver MSN RN CNS WTA Entered By: Midge Aver on 11/25/2022  13:31:36 -------------------------------------------------------------------------------- Education Screening Details Patient Name: Date of Service: DO Jenna [name] NNE S. 11/25/2022 1:00 PM Medical Record Number: [id number] Patient Account Number: [id number] Date of Birth/Sex: Treating RN: 04/10/1931 (88 y.o. Jenna [name] Primary Care Malakye Nolden: Docia Chuck, Dibas Other Clinician: Referring Verlin Uher: Treating Felipe Paluch/Extender: Allen Derry Self, Referral Weeks in Treatment: 0 Learning Preferences/Education Level/Primary Language Learning Preference: Demonstration Highest Education Level: High School Preferred Language: English Cognitive Barrier Language Barrier: No Translator Needed: No Memory Deficit: No Emotional Barrier: No Cultural/Religious Beliefs Affecting Medical Care: No Physical Barrier Impaired Vision: Yes Glasses Impaired Hearing: No Decreased Hand dexterity: No Knowledge/Comprehension Knowledge Level: High Comprehension Level: High Ability to understand written instructions: High Ability to understand verbal instructions: High Motivation Anxiety Level: Calm Cooperation: Cooperative Education Importance: Acknowledges Need Interest in Health Problems: Asks Questions Perception: Coherent Willingness to Engage in Self-Management High Activities: Readiness to Engage in Self-Management High Activities: Electronic Signature(s) Signed: 11/25/2022 3:52:05 PM By: Midge Aver MSN RN CNS WTA Entered By: Midge Aver on 11/25/2022 13:31:40 Jenna [name] ([id number]) [id number] Page 3 of 5 -------------------------------------------------------------------------------- Fall Risk Assessment Details Patient Name: Date of Service: DO Jenna [name] NNE S. 11/25/2022 1:00 PM Medical Record Number: [id number] Patient Account Number: [id number] Date of Birth/Sex: Treating RN: [birth year] (88 y.o. Jenna [name] Primary Care Aravind Chrismer:  Docia Chuck, Dibas Other Clinician: Referring Yareliz Thorstenson: Treating Ayomide Purdy/Extender: Allen Derry Self, Referral Weeks in Treatment: 0 Fall Risk Assessment Items Have you had 2 or more falls in the last 12 monthso 0 No Have you had any fall that resulted in injury in the last 12 monthso 0 No FALLS RISK SCREEN History of falling - immediate or within 3 months 0 No Secondary diagnosis (Do you have 2 or more medical diagnoseso) 0 No Ambulatory aid None/bed rest/wheelchair/nurse 0 No Crutches/cane/walker 0 No Furniture 0 No Intravenous therapy Access/Saline/Heparin Lock 0 No Gait/Transferring Normal/ bed rest/ wheelchair 0 No Weak (short steps with or without shuffle, stooped but  able to lift head while walking, may seek 0 No support from furniture) Impaired (short steps with shuffle, may have difficulty arising from chair, head down, impaired 0 No balance) Mental Status Oriented to own ability 0 No Electronic Signature(s) Signed: 11/25/2022 3:52:05 PM By: Midge Aver MSN RN CNS WTA Entered By: Midge Aver on 11/25/2022 13:31:43 -------------------------------------------------------------------------------- Foot Assessment Details Patient Name: Date of Service: DO Jenna [name] NNE S. 11/25/2022 1:00 PM Medical Record Number: [id number] Patient Account Number: [id number] Date of Birth/Sex: Treating RN: [birth year] (88 y.o. Jenna [name] Primary Care Eriko Economos: Docia Chuck, Dibas Other Clinician: Referring Siegfried Vieth: Treating Dorion Petillo/Extender: Allen Derry Self, Referral Weeks in Treatment: 0 Foot Assessment Items Site Locations Jenna [name], Jenna [name] ([id number]) [phone] [id number] Page 4 of 5 + = Sensation present, - = Sensation absent, C = Callus, U = Ulcer R = Redness, W = Warmth, M = Maceration, PU = Pre-ulcerative lesion F = Fissure, S = Swelling, D = Dryness Assessment Right: Left: Other Deformity: No No Prior Foot Ulcer: No No Prior Amputation: No  No Charcot Joint: No No Ambulatory Status: Ambulatory Without Help Gait: Steady Electronic Signature(s) Signed: 11/25/2022 3:52:05 PM By: Midge Aver MSN RN CNS WTA Entered By: Midge Aver on 11/25/2022 13:31:52 -------------------------------------------------------------------------------- Nutrition Risk Screening Details Patient Name: Date of Service: DO Jenna [name] NNE S. 11/25/2022 1:00 PM Medical Record Number: [id number] Patient Account Number: [id number] Date of Birth/Sex: Treating RN: 1931-01-28 (88 y.o. Jenna [name] Primary Care Edit Ricciardelli: Docia Chuck, Dibas Other Clinician: Referring Edwinna Rochette: Treating Korra Christine/Extender: Allen Derry Self, Referral Weeks in Treatment: 0 Height (in): 60 Weight (lbs): 200 Body Mass Index (BMI): 39.1 Nutrition Risk Screening Items Score Screening NUTRITION RISK SCREEN: I have an illness or condition that made me change the kind and/or amount of food I eat 0 No I eat fewer than two meals per day 0 No I eat few fruits and vegetables, or milk products 0 No I have three or more drinks of beer, liquor or wine almost every day 0 No I have tooth or mouth problems that make it hard for me to eat 0 No I don't always have enough money to buy the food I need 0 No Jenna [name], Jenna [name] ([id number]) [phone] [id number] Page 5 of 5 I eat alone most of the time 0 No I take three or more different prescribed or over-the-counter drugs a day 0 No Without wanting to, I have lost or gained 10 pounds in the last six months 0 No I am not always physically able to shop, cook and/or feed myself 0 No Nutrition Protocols Good Risk Protocol 0 No interventions needed Moderate Risk Protocol High Risk Proctocol Risk Level: Good Risk Score: 0 Electronic Signature(s) Signed: 11/25/2022 3:52:05 PM By: Midge Aver MSN RN CNS WTA Entered By: Midge Aver on 11/25/2022 13:31:48

## 2022-11-27 DIAGNOSIS — E119 Type 2 diabetes mellitus without complications: Secondary | ICD-10-CM | POA: Diagnosis not present

## 2022-12-02 ENCOUNTER — Encounter: Payer: Medicare Other | Attending: Physician Assistant | Admitting: Physician Assistant

## 2022-12-02 DIAGNOSIS — I890 Lymphedema, not elsewhere classified: Secondary | ICD-10-CM | POA: Insufficient documentation

## 2022-12-02 DIAGNOSIS — I10 Essential (primary) hypertension: Secondary | ICD-10-CM | POA: Insufficient documentation

## 2022-12-02 DIAGNOSIS — L97822 Non-pressure chronic ulcer of other part of left lower leg with fat layer exposed: Secondary | ICD-10-CM | POA: Diagnosis not present

## 2022-12-02 DIAGNOSIS — I87332 Chronic venous hypertension (idiopathic) with ulcer and inflammation of left lower extremity: Secondary | ICD-10-CM | POA: Insufficient documentation

## 2022-12-02 DIAGNOSIS — E11622 Type 2 diabetes mellitus with other skin ulcer: Secondary | ICD-10-CM | POA: Diagnosis not present

## 2022-12-02 NOTE — Progress Notes (Addendum)
Jenna [name], Jenna [name] ([id number]) [id number] Page 1 of 9 Visit Report for 12/02/2022 Chief Complaint Document Details Patient Name: Date of Service: DO Jenna [name] NNE S. 12/02/2022 11:30 A M Medical Record Number: [id number] Patient Account Number: [id number] Date of Birth/Sex: Treating RN: September 08, 1931 (88 y.o. Jenna [name] Primary Care Provider: [name], Dibas Other Clinician: Referring Provider: Treating Provider/Extender: Juel Burrow, Dibas Weeks in Treatment: 1 Information Obtained from: Patient Chief Complaint Left LE Ulcer Electronic Signature(s) Signed: 12/02/2022 4:58:05 PM By: Rosalio Loud MSN RN CNS WTA Signed: 12/04/2022 4:35:46 PM By: Worthy Keeler PA-C Previous Signature: 12/02/2022 11:43:52 AM Version By: Worthy Keeler PA-C Entered By: Rosalio Loud on 12/02/2022 16:58:05 -------------------------------------------------------------------------------- HPI Details Patient Name: Date of Service: DO Jenna [name] NNE S. 12/02/2022 11:30 A M Medical Record Number: [id number] Patient Account Number: [id number] Date of Birth/Sex: Treating RN: 1931-02-08 (88 y.o. Jenna [name] Primary Care Provider: [name], Dibas Other Clinician: Referring Provider: Treating Provider/Extender: Juel Burrow, Dibas Weeks in Treatment: 1 History of Present Illness Location: left lower extremity in the medial ankle region Quality: Patient reports experiencing a dull pain to affected area(s). Severity: Patient states wound are getting worse. Duration: Patient has had the wound for > 3 months prior to seeking treatment at the wound center Timing: Pain in wound is Intermittent (comes and goes Context: The wound appeared gradually over time Modifying Factors: Other treatment(s) tried include:[name] ssociated Signs and Symptoms: Patient reports having increase swelling. A HPI Description: 88 year old  patient with a past medical history significant for venous stasis ulceration and diabetes mellitus was recently admitted to the hospital between 07/07/2016 and 07/09/2016. She was wound to have a nonpurulent cellulitis of the left lower extremity and was started on IV antibiotics which included vancomycin. He is discharged home with her and Unna's boots and oral doxycycline. During this admission there was no obvious DVT involving the left lower extremity. her past medical history significant for diabetes mellitus, hypertension, hyperlipidemia, varicose veins, status post right rotator cuff repair, knee surgery on the left,robotic abdominal hysterectomy, laparoscopic cholecystectomy. She is not a smoker. Jenna [name], Jenna [name] ([id number]) [id number] Page 2 of 9 Venous study done on 07/18/2015 showed normal left extremity deep venous system with no evidence of DVT There was extensive valvular incompetence . and reflux throughout the left greater saphenous vein with direct the medication to subcutaneous varicose veins. Normal left small saphenous vein. I understand she was seen by vascular radiology group and the workup and recommendation was for endovenous ablation but due to family pressure she was not able to keep her appointment a year ago. She has not been wearing her compression stockings regularly. 05/26/18 READMISSION this is an 88 year old woman who was previously seen in this clinic in 2017 by Dr. Con Memos. She has chronic venous insufficiency with lymphedema and at that time had open area on the left medial calf and ankle area. Was recommended that she wear 20-30 mm compression stockings and the patient tells me that she has been compliant with this although I think her primary doctor recently told her that she didn't need to wear stockings out of fear it might cause arterial compression. She noticed edema and pain in the area a week to 2 ago. She saw her primary doctor  and was given doxycycline and Silvadene cream to put on the area. She states things are a lot better. The patient has a history of type 2 diabetes on  oral agents but she is unaware of her hemoglobin A1c for her blood glucose which she doesn't check. She has hypertension, varicose veins, rotator cuff repair, knee surgery on the left, history of a laparoscopic cholecystectomy, lymphedema, obstructive sleep apnea, history of endometrial CA. The patient has been to see vein and vascular in the past and I think has had an ablation of the left greater saphenous vein based on ultrasounds of the left leg IC dating back to 2017. Her most recent ultrasound was in May 2018 which showed no evidence of a DVT and continued durable closure of the treated segment of the left greater saphenous vein. Patent lateral accessory branch of the greater saphenous vein extending to the calf ABIs in our clinic were 1.05 on the right and 0.95 on the left 06/02/18 the patient's wound on the left medial lower calf is completely closed and epithelialized. She has new 20-30 mm below-knee stockings READMISSION 11/02/2019 This is a now 88 year old woman who has been in this clinic 2 times before. Most recently in 2019. She has chronic venous insufficiency with some degree of secondary lymphedema. She has had a history of a left greater saphenous vein ablation. When she is here in 2019 she had a wound on the left medial lower calf. This closed fairly easily. It was recommended she wear 20/30 mm below-knee stockings she is not compliant with this. She arrives in clinic with a 2-week history of a left medial lower extremity and ankle wound. Some of this has dry slough on it but most of it is already epithelialize she has been using a combination of Vaseline and/or Silvadene. She is not wearing any compression. She has a history of chronic venous reflux. There was recommendations in the past for ablations I do not know that she ever  carried through with this. According to notes from 2016 this work-up was done via the interventional radiology group. We will need to research this. She is probably going to need a consultation ABIs in our clinic were 1.03 on the right and 0.93 on the left 11/11; patient's wound looks as though it is epithelializing horizontal wound on the left medial lower extremity. She has a superior satellite lesion. She is complaining of a lot of pain in 3 layer compression. There have been recommendations for previous ablations I am not sure if she followed up on this. She has been noncompliant with stockings although she brought those into the clinic today. 11/18; the patient had a repeat venous reflux studies at vein and vascular. This did not show any DVT in the left lower extremity there was no evidence of chronic venous insufficiency and no evidence of superficial vein thrombosis. I looked back at previous studies done I think in interventional radiology in 2018 would suggest that there had been previous ablation of a segment of the left greater saphenous vein. By review of the new study I do not think the patient needs to see vein and vascular however I find these studies increasingly difficult to interpret. I am not sure that the patient has actually consistently worn compression stockings and I think that is the next step here. The wound is just about closed 11/22/2019 upon evaluation today patient actually appears to be healed based on what I am seeing today. She has been tolerating the dressing changes without complication. Fortunately there is no signs of active infection at this time. Overall I feel like even though this is the first time of seeing her that she  has actually done extremely well with the current wound care measures again she has been under the care of Dr. Dellia Nims. 12/2 the patient was expected to be healed this week however she comes in with 3 small wounds that look much the same as the  pictures from 2 weeks ago. These are all in the area of the left medial lower extremity. She was put into her own compression stockings last week I do not think the edema control was adequate in this area for healing. We have been using Hydrofera Blue 12/9; wound is fully epithelialized but still looks vulnerable on the left medial lower extremity. Surrounding venous inflammation. I think she has lymphedema with fibrosed skin in the distal lower leg. [Inverted bottle sign]. She has had venous reflux studies that have not shown any of the superficial veins to be amenable to ablations. This is been repeated during this visit. I am not really convinced that she has been wearing compression stockings although she certainly has them. 12/23; patient's wound on the left medial lower extremity is totally healed. She has surrounding venous inflammation and skin damage related to there is also some degree of lymphedema and fibrosed skin in the distal lower extremity. She has her compression stocking Readmission: 03/25/2021 upon evaluation today patient appears to be doing excellent in regard to her wound all things considered. She does appear to have gotten very quickly this time which is great news compared to before when she tells me she let the wound get somewhat out of control. Nonetheless right now she tells me this has been present for about 3 weeks she has been using Vaseline on it. She does have a history of diabetes though she has not taken Metformin for years she sees her primary care provider on Friday and she will see what he says but she is hoping he would not put her back on this. Subsequently she also does have Lasix that she is not been taking it recently. She has a past medical history significant for venous stasis, lymphedema, diabetes mellitus type 2, and hypertension. 04/01/2021 on evaluation today patient appears to be doing well with regard to her wound. There is no signs of active infection  at this time. No fevers, chills, nausea, vomiting, or diarrhea. 04/08/2021 upon evaluation today patient appears to be doing excellent in regard to her wounds currently. She has been tolerating the dressing changes and in fact appears to be completely healed which is great news. Readmission: 08/22/2021 patient presents today for reevaluation here in the clinic concerning a reopening of the wound on the left medial ankle/lower leg region which is the same area I took care of earlier in the year between March and April. Fortunately there does not appear to be anything to do a peer which is good news. I do think that as before this can probably heal fatherly rapidly. Nonetheless I do believe that based on what we are seeing she probably needs a compression wrap at this time. Her medical history really is not changed. Her ABI back in March of this year was 1.28 on this left leg and was doing well. 08/29/2021 upon evaluation today patient appears to be doing well. With regard to her wound she is actually showing signs of good granulation. I am very pleased in that regard. There does not appear to be any evidence of active infection at this time. 09/06/2021 upon evaluation today patient's wound actually showing signs of doing very well as far as granulation  epithelization is concerned. Fortunately I do not see any need for sharp debridement today and very pleased in that regard. There does seem to be some issue here with still continued drainage but nonetheless I think that the compression wrap is doing a very good job in helping to mitigate this as well. Overall I think that we are headed in an appropriate direction towards getting this completely closed. 09/13/2021 upon evaluation today patient appears to be doing better in regard to the overall measurement today. In fact the measurement really does not speak Jenna [name], Jenna [name] ([id number]) [id number] Page 3 of 9 to as much as this  is healed due to the fact that some of the areas that are open are somewhat scattered. In general I am extremely pleased with where we stand today with healing. The patient likewise is also extremely happy with what she is seeing and hopefully will get this closed pretty soon. 09/20/2021 upon evaluation today patient appears to be doing well at this point with regard to her wound. She has been tolerating the dressing changes without complication. Fortunately there does not appear to be any signs of active infection at this time. In fact I feel like this is probably completely healed. 09/30/2021 upon evaluation today patient actually appears to be doing quite well in regard to her wound. She has been tolerating the dressing changes without complication. Fortunately there does not appear to be any signs of active infection which is great news. No fevers, chills, nausea, vomiting, or diarrhea. Readmission: 12/06/2021 upon evaluation today patient appears to be doing decently well in regard to her left leg. She had previously had an issue here with cellulitis that she did go to an urgent care for and they placed her on doxycycline. The good news is this seems to have gotten much better. She tells me since I last saw her she has been wearing her compression socks and this happened despite that nonetheless. She does not take any fluid pills right now. Fortunately there is no signs of active infection locally nor systemically at this point. Readmission: 11-25-2022 upon evaluation today patient presents for reevaluation here in the clinic although it has been a little bit of time since I last saw her December 06, 2021 almost a year ago. With that being said in the past she is actually responding well to compression wrap she does have an area on the left medial lower extremity today which is her pretty common place for this to reopen. She was supposed of had a venous ablation on Monday but did not go due to the  wound. Subsequently we are going to need to get something going here for her as far as trying to get the area to heal effectively is concerned at this point. In regard to the patient's past medical history nothing has really changed significantly since she was last seeing here in the clinic about a year ago. 12-02-2022 upon evaluation today patient appears to be doing poorly currently in regard to her wound. In fact this is showing signs of infection for certain. Fortunately I do not see any signs of systemic infection but locally there is definitely signs of infection going on at this point. No fevers, chills, nausea, vomiting, or diarrhea. Electronic Signature(s) Signed: 12/02/2022 12:08:32 PM By: Worthy Keeler PA-C Entered By: Worthy Keeler on 12/02/2022 12:08:32 -------------------------------------------------------------------------------- Physical Exam Details Patient Name: Date of Service: DO Jenna [name] NNE S. 12/02/2022 11:30 A M Medical Record Number: [id number]  Patient Account Number: [id number] Date of Birth/Sex: Treating RN: 1931/09/09 (88 y.o. Jenna [name] Primary Care Provider: [name], Dibas Other Clinician: Referring Provider: Treating Provider/Extender: Juel Burrow, Dibas Weeks in Treatment: 1 Constitutional Well-nourished and well-hydrated in no acute distress. Respiratory normal breathing without difficulty. Psychiatric this patient is able to make decisions and demonstrates good insight into disease process. Alert and Oriented x 3. pleasant and cooperative. Notes Upon inspection patient's wound actually showed blue-green drainage which has me concerned about infection. I think that this is Pseudomonas. Everything points to that. I also think that we need to get her on an antibiotic as quickly as possible I am going to suggest Cipro. Electronic Signature(s) Signed: 12/02/2022 12:09:16 PM By: Worthy Keeler PA-C Previous Signature: 12/02/2022 12:08:48 PM  Version By: Worthy Keeler PA-C Entered By: Worthy Keeler on 12/02/2022 12:09:16 [name] ([id number]) [id number] Page 4 of 9 -------------------------------------------------------------------------------- Physician Orders Details Patient Name: Date of Service: DO Jenna [name] NNE S. 12/02/2022 11:30 A M Medical Record Number: [id number] Patient Account Number: [id number] Date of Birth/Sex: Treating RN: [birth year] (88 y.o. Jenna [name] Primary Care Provider: [name], [place]ibas Other Clinician: Referring Provider: Treating Provider/Extender: Juel Burrow, Dibas Weeks in Treatment: 1 Verbal / Phone Orders: No Diagnosis Coding ICD-10 Coding Code Description E11.622 Type 2 diabetes mellitus with other skin ulcer I87.332 Chronic venous hypertension (idiopathic) with ulcer and inflammation of left lower extremity I89.0 Lymphedema, not elsewhere classified L97.822 Non-pressure chronic ulcer of other part of left lower leg with fat layer exposed I10 Essential (primary) hypertension Follow-up Appointments Return Appointment in 1 week. Bathing/ Shower/ Hygiene No tub bath. - Do not get wrap wet Anesthetic (Use 'Patient Medications' Section for Anesthetic Order Entry) Lidocaine applied to wound bed Wound Treatment Wound #6 - Lower Leg Wound Laterality: Left, Medial Peri-Wound Care: AandD Ointment 1 x Per Week/30 Days Discharge Instructions: Apply AandD Ointment as directed Topical: Gentamicin 1 x Per Week/30 Days Discharge Instructions: Apply as directed by provider. Prim Dressing: Silvercel 4 1/4x 4 1/4 (in/in) 1 x Per Week/30 Days ary Discharge Instructions: Apply Silvercel 4 1/4x 4 1/4 (in/in) as instructed Secondary Dressing: Zetuvit Plus 4x8 (in/in) 1 x Per Week/30 Days Secured With: [employer] Sterile or Non-Sterile 6-ply 4.5x4 (yd/yd) 1 x Per Week/30 Days Discharge Instructions: Apply Kerlix as directed Compression Wrap: 3-LAYER WRAP -  Profore Lite LF 3 Multilayer Compression Bandaging System 1 x Per Week/30 Days Discharge Instructions: Apply 3 multi-layer wrap as prescribed. Patient Medications llergies: Shellfish Containing Products A Notifications Medication Indication Start End 12/02/2022 Cipro DOSE 1 - oral 500 mg tablet - 1 tablet oral twice a day x 14 days. do not take Detrol LA while on this medication Electronic Signature(s) Signed: 12/02/2022 4:52:19 PM By: Rosalio Loud MSN RN CNS WTA Signed: 12/04/2022 4:35:46 PM By: Jerene Bears ([id number]) By: Worthy Keeler PA-C [phone].pdf Page 5 of 9 Signed: 12/04/2022 4:35:46 PM Previous Signature: 12/02/2022 12:11:03 PM Version By: Worthy Keeler PA-C Previous Signature: 12/02/2022 11:50:12 AM Version By: Rosalio Loud MSN RN CNS WTA Entered By: Rosalio Loud on 12/02/2022 16:52:18 -------------------------------------------------------------------------------- Problem List Details Patient Name: Date of Service: DO Jenna [name] NNE S. 12/02/2022 11:30 A M Medical Record Number: [id number] Patient Account Number: [id number] Date of Birth/Sex: Treating RN: [birth year] (88 y.o. Jenna [name] Primary Care Provider: [name], Dibas Other Clinician: Referring Provider: Treating Provider/Extender: Juel Burrow, Dibas Weeks in Treatment: 1 Active Problems ICD-10  Encounter Code Description Active Date MDM Diagnosis E11.622 Type 2 diabetes mellitus with other skin ulcer 11/25/2022 No Yes I87.332 Chronic venous hypertension (idiopathic) with ulcer and inflammation of left 11/25/2022 No Yes lower extremity I89.0 Lymphedema, not elsewhere classified 11/25/2022 No Yes L97.822 Non-pressure chronic ulcer of other part of left lower leg with fat layer exposed11/28/2023 No Yes I10 Essential (primary) hypertension 11/25/2022 No Yes Inactive Problems Resolved Problems Electronic Signature(s) Signed: 12/02/2022 4:58:00 PM By:  Rosalio Loud MSN RN CNS WTA Signed: 12/04/2022 4:35:46 PM By: Worthy Keeler PA-C Previous Signature: 12/02/2022 11:43:45 AM Version By: Worthy Keeler PA-C Entered By: Rosalio Loud on 12/02/2022 16:58:00 [name] ([id number]) [id number] Page 6 of 9 -------------------------------------------------------------------------------- Progress Note Details Patient Name: Date of Service: DO Jenna [name] NNE S. 12/02/2022 11:30 A M Medical Record Number: [id number] Patient Account Number: [id number] Date of Birth/Sex: Treating RN: 1931/04/20 (88 y.o. Jenna [name] Primary Care Provider: [name], [place]ibas Other Clinician: Referring Provider: Treating Provider/Extender: Juel Burrow, Dibas Weeks in Treatment: 1 Subjective Chief Complaint Information obtained from Patient Left LE Ulcer History of Present Illness (HPI) The following HPI elements were documented for the patient's wound: Location: left lower extremity in the medial ankle region Quality: Patient reports experiencing a dull pain to affected area(s). Severity: Patient states wound are getting worse. Duration: Patient has had the wound for > 3 months prior to seeking treatment at the wound center Timing: Pain in wound is Intermittent (comes and goes Context: The wound appeared gradually over time Modifying Factors: Other treatment(s) tried include:[name] Associated Signs and Symptoms: Patient reports having increase swelling. 88 year old patient with a past medical history significant for venous stasis ulceration and diabetes mellitus was recently admitted to the hospital between 07/07/2016 and 07/09/2016. She was wound to have a nonpurulent cellulitis of the left lower extremity and was started on IV antibiotics which included vancomycin. He is discharged home with her and Unna's boots and oral doxycycline. During this admission there was  no obvious DVT involving the left lower extremity. her past medical history significant for diabetes mellitus, hypertension, hyperlipidemia, varicose veins, status post right rotator cuff repair, knee surgery on the left,robotic abdominal hysterectomy, laparoscopic cholecystectomy. She is not a smoker. Venous study done on 07/18/2015 showed normal left extremity deep venous system with no evidence of DVT There was extensive valvular incompetence . and reflux throughout the left greater saphenous vein with direct the medication to subcutaneous varicose veins. Normal left small saphenous vein. I understand she was seen by vascular radiology group and the workup and recommendation was for endovenous ablation but due to family pressure she was not able to keep her appointment a year ago. She has not been wearing her compression stockings regularly. 05/26/18 READMISSION this is an 88 year old woman who was previously seen in this clinic in 2017 by Dr. Con Memos. She has chronic venous insufficiency with lymphedema and at that time had open area on the left medial calf and ankle area. Was recommended that she wear 20-30 mm compression stockings and the patient tells me that she has been compliant with this although I think her primary doctor recently told her that she didn't need to wear stockings out of fear it might cause arterial compression. She noticed edema and pain in the area a week to 2 ago. She saw her primary doctor and was given doxycycline and Silvadene cream to put on the area. She states things are a lot  better. The patient has a history of type 2 diabetes on oral agents but she is unaware of her hemoglobin A1c for her blood glucose which she doesn't check. She has hypertension, varicose veins, rotator cuff repair, knee surgery on the left, history of a laparoscopic cholecystectomy, lymphedema, obstructive sleep apnea, history of endometrial CA. The patient has been to see vein and vascular in  the past and I think has had an ablation of the left greater saphenous vein based on ultrasounds of the left leg IC dating back to 2017. Her most recent ultrasound was in May 2018 which showed no evidence of a DVT and continued durable closure of the treated segment of the left greater saphenous vein. Patent lateral accessory branch of the greater saphenous vein extending to the calf ABIs in our clinic were 1.05 on the right and 0.95 on the left 06/02/18 the patient's wound on the left medial lower calf is completely closed and epithelialized. She has new 20-30 mm below-knee stockings READMISSION 11/02/2019 This is a now 88 year old woman who has been in this clinic 2 times before. Most recently in 2019. She has chronic venous insufficiency with some degree of secondary lymphedema. She has had a history of a left greater saphenous vein ablation. When she is here in 2019 she had a wound on the left medial lower calf. This closed fairly easily. It was recommended she wear 20/30 mm below-knee stockings she is not compliant with this. She arrives in clinic with a 2-week history of a left medial lower extremity and ankle wound. Some of this has dry slough on it but most of it is already epithelialize she has been using a combination of Vaseline and/or Silvadene. She is not wearing any compression. She has a history of chronic venous reflux. There was recommendations in the past for ablations I do not know that she ever carried through with this. According to notes from 2016 this work-up was done via the interventional radiology group. We will need to research this. She is probably going to need a consultation ABIs in our clinic were 1.03 on the right and 0.93 on the left 11/11; patient's wound looks as though it is epithelializing horizontal wound on the left medial lower extremity. She has a superior satellite lesion. She is complaining of a lot of pain in 3 layer compression. There have been  recommendations for previous ablations I am not sure if she followed up on this. She has Jenna [name], Jenna [name] ([id number]) [id number] Page 7 of 9 been noncompliant with stockings although she brought those into the clinic today. 11/18; the patient had a repeat venous reflux studies at vein and vascular. This did not show any DVT in the left lower extremity there was no evidence of chronic venous insufficiency and no evidence of superficial vein thrombosis. I looked back at previous studies done I think in interventional radiology in 2018 would suggest that there had been previous ablation of a segment of the left greater saphenous vein. By review of the new study I do not think the patient needs to see vein and vascular however I find these studies increasingly difficult to interpret. I am not sure that the patient has actually consistently worn compression stockings and I think that is the next step here. The wound is just about closed 11/22/2019 upon evaluation today patient actually appears to be healed based on what I am seeing today. She has been tolerating the dressing changes without complication. Fortunately there is no signs of  active infection at this time. Overall I feel like even though this is the first time of seeing her that she has actually done extremely well with the current wound care measures again she has been under the care of Dr. Dellia Nims. 12/2 the patient was expected to be healed this week however she comes in with 3 small wounds that look much the same as the pictures from 2 weeks ago. These are all in the area of the left medial lower extremity. She was put into her own compression stockings last week I do not think the edema control was adequate in this area for healing. We have been using Hydrofera Blue 12/9; wound is fully epithelialized but still looks vulnerable on the left medial lower extremity. Surrounding venous inflammation. I think she has  lymphedema with fibrosed skin in the distal lower leg. [Inverted bottle sign]. She has had venous reflux studies that have not shown any of the superficial veins to be amenable to ablations. This is been repeated during this visit. I am not really convinced that she has been wearing compression stockings although she certainly has them. 12/23; patient's wound on the left medial lower extremity is totally healed. She has surrounding venous inflammation and skin damage related to there is also some degree of lymphedema and fibrosed skin in the distal lower extremity. She has her compression stocking Readmission: 03/25/2021 upon evaluation today patient appears to be doing excellent in regard to her wound all things considered. She does appear to have gotten very quickly this time which is great news compared to before when she tells me she let the wound get somewhat out of control. Nonetheless right now she tells me this has been present for about 3 weeks she has been using Vaseline on it. She does have a history of diabetes though she has not taken Metformin for years she sees her primary care provider on Friday and she will see what he says but she is hoping he would not put her back on this. Subsequently she also does have Lasix that she is not been taking it recently. She has a past medical history significant for venous stasis, lymphedema, diabetes mellitus type 2, and hypertension. 04/01/2021 on evaluation today patient appears to be doing well with regard to her wound. There is no signs of active infection at this time. No fevers, chills, nausea, vomiting, or diarrhea. 04/08/2021 upon evaluation today patient appears to be doing excellent in regard to her wounds currently. She has been tolerating the dressing changes and in fact appears to be completely healed which is great news. Readmission: 08/22/2021 patient presents today for reevaluation here in the clinic concerning a reopening of the wound  on the left medial ankle/lower leg region which is the same area I took care of earlier in the year between March and April. Fortunately there does not appear to be anything to do a peer which is good news. I do think that as before this can probably heal fatherly rapidly. Nonetheless I do believe that based on what we are seeing she probably needs a compression wrap at this time. Her medical history really is not changed. Her ABI back in March of this year was 1.28 on this left leg and was doing well. 08/29/2021 upon evaluation today patient appears to be doing well. With regard to her wound she is actually showing signs of good granulation. I am very pleased in that regard. There does not appear to be any evidence of active  infection at this time. 09/06/2021 upon evaluation today patient's wound actually showing signs of doing very well as far as granulation epithelization is concerned. Fortunately I do not see any need for sharp debridement today and very pleased in that regard. There does seem to be some issue here with still continued drainage but nonetheless I think that the compression wrap is doing a very good job in helping to mitigate this as well. Overall I think that we are headed in an appropriate direction towards getting this completely closed. 09/13/2021 upon evaluation today patient appears to be doing better in regard to the overall measurement today. In fact the measurement really does not speak to as much as this is healed due to the fact that some of the areas that are open are somewhat scattered. In general I am extremely pleased with where we stand today with healing. The patient likewise is also extremely happy with what she is seeing and hopefully will get this closed pretty soon. 09/20/2021 upon evaluation today patient appears to be doing well at this point with regard to her wound. She has been tolerating the dressing changes without complication. Fortunately there does not appear  to be any signs of active infection at this time. In fact I feel like this is probably completely healed. 09/30/2021 upon evaluation today patient actually appears to be doing quite well in regard to her wound. She has been tolerating the dressing changes without complication. Fortunately there does not appear to be any signs of active infection which is great news. No fevers, chills, nausea, vomiting, or diarrhea. Readmission: 12/06/2021 upon evaluation today patient appears to be doing decently well in regard to her left leg. She had previously had an issue here with cellulitis that she did go to an urgent care for and they placed her on doxycycline. The good news is this seems to have gotten much better. She tells me since I last saw her she has been wearing her compression socks and this happened despite that nonetheless. She does not take any fluid pills right now. Fortunately there is no signs of active infection locally nor systemically at this point. Readmission: 11-25-2022 upon evaluation today patient presents for reevaluation here in the clinic although it has been a little bit of time since I last saw her December 06, 2021 almost a year ago. With that being said in the past she is actually responding well to compression wrap she does have an area on the left medial lower extremity today which is her pretty common place for this to reopen. She was supposed of had a venous ablation on Monday but did not go due to the wound. Subsequently we are going to need to get something going here for her as far as trying to get the area to heal effectively is concerned at this point. In regard to the patient's past medical history nothing has really changed significantly since she was last seeing here in the clinic about a year ago. 12-02-2022 upon evaluation today patient appears to be doing poorly currently in regard to her wound. In fact this is showing signs of infection for certain. Fortunately I do  not see any signs of systemic infection but locally there is definitely signs of infection going on at this point. No fevers, chills, nausea, vomiting, or diarrhea. Objective Constitutional Jenna [name], Jenna [name] ([id number]) [id number] Page 8 of 9 Well-nourished and well-hydrated in no acute distress. Vitals Time Taken: 11:36 AM, Height: 60 in, Weight: 200 lbs, BMI:  39.1, Temperature: 98.1 F, Pulse: 82 bpm, Respiratory Rate: 18 breaths/min, Blood Pressure: 191/78 mmHg. Respiratory normal breathing without difficulty. Psychiatric this patient is able to make decisions and demonstrates good insight into disease process. Alert and Oriented x 3. pleasant and cooperative. General Notes: Upon inspection patient's wound actually showed blue-green drainage which has me concerned about infection. I think that this is Pseudomonas. Everything points to that. I also think that we need to get her on an antibiotic as quickly as possible I am going to suggest Cipro. Integumentary (Hair, Skin) Wound #6 status is Open. Original cause of wound was Gradually Appeared. The date acquired was: 10/29/2022. The wound has been in treatment 1 weeks. The wound is located on the Left,Medial Lower Leg. The wound measures 8cm length x 4.5cm width x 0.2cm depth; 28.274cm^2 area and 5.655cm^3 volume. There is Fat Layer (Subcutaneous Tissue) exposed. There is a medium amount of purulent drainage noted. Foul odor after cleansing was noted. There is small (1- 33%) red granulation within the wound bed. There is a small (1-33%) amount of necrotic tissue within the wound bed including Adherent Slough. Assessment Active Problems ICD-10 Type 2 diabetes mellitus with other skin ulcer Chronic venous hypertension (idiopathic) with ulcer and inflammation of left lower extremity Lymphedema, not elsewhere classified Non-pressure chronic ulcer of other part of left lower leg with fat layer exposed Essential  (primary) hypertension Plan Follow-up Appointments: Return Appointment in 1 week. Bathing/ Shower/ Hygiene: No tub bath. - Do not get wrap wet Anesthetic (Use 'Patient Medications' Section for Anesthetic Order Entry): Lidocaine applied to wound bed The following medication(s) was prescribed: Cipro oral 500 mg tablet 1 1 tablet oral twice a day x 14 days. do not take Detrol LA while on this medication starting 12/02/2022 WOUND #6: - Lower Leg Wound Laterality: Left, Medial Peri-Wound Care: AandD Ointment 1 x Per Week/30 Days Discharge Instructions: Apply AandD Ointment as directed Topical: Gentamicin 1 x Per Week/30 Days Discharge Instructions: Apply as directed by provider. Prim Dressing: Silvercel 4 1/4x 4 1/4 (in/in) 1 x Per Week/30 Days ary Discharge Instructions: Apply Silvercel 4 1/4x 4 1/4 (in/in) as instructed Secondary Dressing: Zetuvit Plus 4x8 (in/in) 1 x Per Week/30 Days Secured With: [employer] Sterile or Non-Sterile 6-ply 4.5x4 (yd/yd) 1 x Per Week/30 Days Discharge Instructions: Apply Kerlix as directed Com pression Wrap: 3-LAYER WRAP - Profore Lite LF 3 Multilayer Compression Bandaging System 1 x Per Week/30 Days Discharge Instructions: Apply 3 multi-layer wrap as prescribed. 1. I am going to go ahead and get the patient started with the antibiotic therapy I am going to send the Cipro into the pharmacy for her and she is in agreement with that plan. 2. I am good to have her as well gentamicin added to the wound bed followed by the silver alginate dressing and then the Zetuvit followed by the 3 layer compression wrap. 3. I would also suggest the patient should continue to monitor for any signs of infection or worsening in general and if anything changes such as increased pain, fever, chills, nausea, vomiting, or diarrhea she should contact the office let me know or go to the ER ASAP for further evaluation. 4. I am also going to have her come in on Thursday for a nurse visit  dressing change in order to ensure things are going okay since she cannot see this underneath the wrap. We will see patient back for reevaluation in 1 week here in the clinic. If anything worsens or changes  patient will contact our office for additional recommendations. Electronic Signature(s) Signed: 12/02/2022 12:11:30 PM By: Worthy Keeler PA-C Entered By: Worthy Keeler on 12/02/2022 12:11:30 [name] ([id number]) [id number] Page 9 of 9 -------------------------------------------------------------------------------- SuperBill Details Patient Name: Date of Service: DO Jenna [name] NNE S. 12/02/2022 Medical Record Number: [id number] Patient Account Number: [id number] Date of Birth/Sex: Treating RN: 1931/05/02 (88 y.o. Jenna [name] Primary Care Provider: [name], [place] Other Clinician: Referring Provider: Treating Provider/Extender: Juel Burrow, [place] Weeks in Treatment: 1 Diagnosis Coding ICD-10 Codes Code Description E11.622 Type 2 diabetes mellitus with other skin ulcer I87.332 Chronic venous hypertension (idiopathic) with ulcer and inflammation of left lower extremity I89.0 Lymphedema, not elsewhere classified L97.822 Non-pressure chronic ulcer of other part of left lower leg with fat layer exposed I10 Essential (primary) hypertension Facility Procedures : CPT4 Code: [id number] Description: 99213 - WOUND CARE VISIT-LEV 3 EST PT Modifier: Quantity: 1 Physician Procedures : CPT4 Code Description Modifier [id number] - WC PHYS LEVEL 4 - EST PT ICD-10 Diagnosis Description E11.622 Type 2 diabetes mellitus with other skin ulcer I87.332 Chronic venous hypertension (idiopathic) with ulcer and inflammation of left lower  extremity I89.0 Lymphedema, not elsewhere classified L97.822 Non-pressure chronic ulcer of other part of left lower leg with fat layer exposed Quantity: 1 Electronic Signature(s) Signed: 12/02/2022 4:57:36 PM By: Rosalio Loud MSN RN CNS WTA Signed: 12/04/2022 4:35:46 PM By: Worthy Keeler PA-C Previous Signature: 12/02/2022 12:12:35 PM Version By: Worthy Keeler PA-C Entered By: Rosalio Loud on 12/02/2022 16:57:36

## 2022-12-04 DIAGNOSIS — I10 Essential (primary) hypertension: Secondary | ICD-10-CM | POA: Diagnosis not present

## 2022-12-04 DIAGNOSIS — I87332 Chronic venous hypertension (idiopathic) with ulcer and inflammation of left lower extremity: Secondary | ICD-10-CM | POA: Diagnosis not present

## 2022-12-04 DIAGNOSIS — I890 Lymphedema, not elsewhere classified: Secondary | ICD-10-CM | POA: Diagnosis not present

## 2022-12-04 DIAGNOSIS — E11622 Type 2 diabetes mellitus with other skin ulcer: Secondary | ICD-10-CM | POA: Diagnosis not present

## 2022-12-04 DIAGNOSIS — L97822 Non-pressure chronic ulcer of other part of left lower leg with fat layer exposed: Secondary | ICD-10-CM | POA: Diagnosis not present

## 2022-12-05 NOTE — Progress Notes (Signed)
Jenna [name], Jenna [name] ([id number]) [id number] Page 1 of 10 Visit Report for 12/02/2022 Arrival Information Details Patient Name: Date of Service: DO Jenna [name]. 12/02/2022 11:30 A M Medical Record Number: [id number] Patient Account Number: [id number] Date of Birth/Sex: Treating RN: [birth year] (88 y.o. [name] Primary Care Rylynne Schicker: [name], [place] Other Clinician: Referring Enna Warwick: Treating Lawrence Mitch/Extender: Juel Burrow, [place] Weeks in Treatment: 1 Visit Information History Since Last Visit Added or deleted any medications: No Patient Arrived: Cane Any new allergies or adverse reactions: No Arrival Time: 11:33 Hospitalized since last visit: No Accompanied By: husband Implantable device outside of the clinic excluding No Transfer Assistance: None cellular tissue based products placed in the center Patient Identification Verified: Yes since last visit: Secondary Verification Process Completed: Yes Pain Present Now: No Patient Requires Transmission-Based Precautions: No Patient Has Alerts: Yes Patient Alerts: Diabetes II Electronic Signature([name]) Signed: 12/02/2022 4:52:24 PM By: Rosalio Loud MSN RN CNS WTA Previous Signature: 12/02/2022 11:49:08 AM Version By: Rosalio Loud MSN RN CNS WTA Entered By: Rosalio Loud on 12/02/2022 16:52:24 -------------------------------------------------------------------------------- Clinic Level of Care Assessment Details Patient Name: Date of Service: DO Jenna [name]. 12/02/2022 11:30 A M Medical Record Number: [id number] Patient Account Number: [id number] Date of Birth/Sex: Treating RN: Jenna [name] (88 y.o. [name] Primary Care Majestic Molony: [name], [place] Other Clinician: Referring Chalee Hirota: Treating Kylii Ennis/Extender: Juel Burrow, [place] Weeks in Treatment: 1 Clinic Level of Care Assessment Items TOOL 4 Quantity Score X- 1 0 Use when only an EandM is performed on FOLLOW-UP  visit ASSESSMENTS - Nursing Assessment / Reassessment X- 1 10 Reassessment of Co-morbidities (includes updates in patient status) X- 1 5 Reassessment of Adherence to Treatment Plan ASSESSMENTS - Wound and Skin A ssessment / Reassessment X - Simple Wound Assessment / Reassessment - one wound 1 5 Jenna [name], Jenna [name] ([id number]) [id number] Page 2 of 10 '[]'$  - 0 Complex Wound Assessment / Reassessment - multiple wounds '[]'$  - 0 Dermatologic / Skin Assessment (not related to wound area) ASSESSMENTS - Focused Assessment '[]'$  - 0 Circumferential Edema Measurements - multi extremities '[]'$  - 0 Nutritional Assessment / Counseling / Intervention '[]'$  - 0 Lower Extremity Assessment (monofilament, tuning fork, pulses) '[]'$  - 0 Peripheral Arterial Disease Assessment (using hand held doppler) ASSESSMENTS - Ostomy and/or Continence Assessment and Care '[]'$  - 0 Incontinence Assessment and Management '[]'$  - 0 Ostomy Care Assessment and Management (repouching, etc.) PROCESS - Coordination of Care X - Simple Patient / Family Education for ongoing care 1 15 '[]'$  - 0 Complex (extensive) Patient / Family Education for ongoing care X- 1 10 Staff obtains Programmer, systems, Records, T Results / Process Orders est '[]'$  - 0 Staff telephones HHA, Nursing Homes / Clarify orders / etc '[]'$  - 0 Routine Transfer to another Facility (non-emergent condition) '[]'$  - 0 Routine Hospital Admission (non-emergent condition) '[]'$  - 0 New Admissions / Biomedical engineer / Ordering NPWT Apligraf, etc. , '[]'$  - 0 Emergency Hospital Admission (emergent condition) X- 1 10 Simple Discharge Coordination '[]'$  - 0 Complex (extensive) Discharge Coordination PROCESS - Special Needs '[]'$  - 0 Pediatric / Minor Patient Management '[]'$  - 0 Isolation Patient Management '[]'$  - 0 Hearing / Language / Visual special needs '[]'$  - 0 Assessment of Community assistance (transportation, D/C planning, etc.) '[]'$  - 0 Additional assistance /  Altered mentation '[]'$  - 0 Support Surface([name]) Assessment (bed, cushion, seat, etc.) INTERVENTIONS - Wound Cleansing / Measurement X - Simple Wound Cleansing - one wound 1 5 '[]'$  - 0 Complex  Wound Cleansing - multiple wounds X- 1 5 Wound Imaging (photographs - any number of wounds) '[]'$  - 0 Wound Tracing (instead of photographs) X- 1 5 Simple Wound Measurement - one wound '[]'$  - 0 Complex Wound Measurement - multiple wounds INTERVENTIONS - Wound Dressings '[]'$  - 0 Small Wound Dressing one or multiple wounds X- 1 15 Medium Wound Dressing one or multiple wounds '[]'$  - 0 Large Wound Dressing one or multiple wounds '[]'$  - 0 Application of Medications - topical '[]'$  - 0 Application of Medications - injection INTERVENTIONS - Miscellaneous '[]'$  - 0 External ear exam '[]'$  - 0 Specimen Collection (cultures, biopsies, blood, body fluids, etc.) '[]'$  - 0 Specimen([name]) / Culture([name]) sent or taken to Lab for analysis Jenna [name], Jenna [name] ([id number]) [id number] Page 3 of 10 '[]'$  - 0 Patient Transfer (multiple staff / Civil Service fast streamer / Similar devices) '[]'$  - 0 Simple Staple / Suture removal (25 or less) '[]'$  - 0 Complex Staple / Suture removal ([name] or more) '[]'$  - 0 Hypo / Hyperglycemic Management (close monitor of Blood Glucose) '[]'$  - 0 Ankle / Brachial Index (ABI) - do not check if billed separately X- 1 5 Vital Signs Has the patient been seen at the hospital within the last three years: Yes Total Score: 90 Level Of Care: New/Established - Level 3 Electronic Signature([name]) Signed: 12/02/2022 5:09:56 PM By: Rosalio Loud MSN RN CNS WTA Entered By: Rosalio Loud on 12/02/2022 16:57:19 -------------------------------------------------------------------------------- Encounter Discharge Information Details Patient Name: Date of Service: DO Jenna [name]. 12/02/2022 11:30 A M Medical Record Number: [id number] Patient Account Number: [id number] Date of Birth/Sex: Treating RN: [birth year] Jenna [name] (88 y.o. [name] Primary Care Aubri Gathright: [name], [place] Other Clinician: Referring Eldana Isip: Treating Krina Mraz/Extender: Juel Burrow, [place] Weeks in Treatment: 1 Encounter Discharge Information Items Discharge Condition: Stable Ambulatory Status: Cane Discharge Destination: Home Transportation: Private Auto Accompanied By: self Schedule Follow-up Appointment: Yes Clinical Summary of Care: Electronic Signature([name]) Signed: 12/02/2022 4:58:29 PM By: Rosalio Loud MSN RN CNS WTA Previous Signature: 12/02/2022 11:52:06 AM Version By: Rosalio Loud MSN RN CNS WTA Entered By: Rosalio Loud on 12/02/2022 16:58:28 -------------------------------------------------------------------------------- Lower Extremity Assessment Details Patient Name: Date of Service: DO Jenna [name]. 12/02/2022 11:30 A M Medical Record Number: [id number] Patient Account Number: [id number] Date of Birth/Sex: Treating RN: [birth year]enna [name] (88 y.o. Jenna [name], Jenna [name], Jenna [name] ([id number]) [id number].pdf Page 4 of 10 Primary Care Armya Westerhoff: [place], [place] Other Clinician: Referring Kristeena Meineke: Treating Letrice Pollok/Extender: Juel Burrow, [place] Weeks in Treatment: 1 Edema Assessment Assessed: [Left: No] [Right: No] [Left: Edema] [Right: :] Calf Left: Right: Point of Measurement: 33 cm From Medial Instep 41 cm Ankle Left: Right: Point of Measurement: 10 cm From Medial Instep 24.5 cm Vascular Assessment Pulses: Dorsalis Pedis Palpable: [Left:Yes] Electronic Signature([name]) Signed: 12/02/2022 4:54:18 PM By: Rosalio Loud MSN RN CNS WTA Previous Signature: 12/02/2022 4:53:03 PM Version By: Rosalio Loud MSN RN CNS WTA Previous Signature: 12/02/2022 11:49:36 AM Version By: Rosalio Loud MSN RN CNS WTA Entered By: Rosalio Loud on 12/02/2022 16:54:17 -------------------------------------------------------------------------------- Multi Wound Chart Details Patient Name: Date of Service: DO Jenna [name]. 12/02/2022 11:30 A M Medical Record Number: [id number] Patient Account Number: [id number] Date of Birth/Sex: Treating RN: Jenna [name], Jenna [name] (88 y.o. [name] Primary Care Zayd Bonet: [name], [place] Other Clinician: Referring Talib Headley: Treating Kyera Felan/Extender: Juel Burrow, [place] Weeks in Treatment: 1 Vital Signs Height(in): 60 Pulse(bpm): 82 Weight(lbs): 200 Blood Pressure(mmHg): 191/78 Body Mass Index(BMI): 39.1 Temperature(F): 98.1 Respiratory Rate(breaths/min): 18 [  Jenna [name], Jenna [name] ([id number]) [id number] Page 1 of 10 Visit Report for 12/02/2022 Arrival Information Details Patient Name: Date of Service: DO Jenna [name]. 12/02/2022 11:30 A M Medical Record Number: [id number] Patient Account Number: [id number] Date of Birth/Sex: Treating RN: [birth year] (88 y.o. [name] Primary Care Rylynne Schicker: [name], [place] Other Clinician: Referring Enna Warwick: Treating Lawrence Mitch/Extender: Juel Burrow, [place] Weeks in Treatment: 1 Visit Information History Since Last Visit Added or deleted any medications: No Patient Arrived: Cane Any new allergies or adverse reactions: No Arrival Time: 11:33 Hospitalized since last visit: No Accompanied By: husband Implantable device outside of the clinic excluding No Transfer Assistance: None cellular tissue based products placed in the center Patient Identification Verified: Yes since last visit: Secondary Verification Process Completed: Yes Pain Present Now: No Patient Requires Transmission-Based Precautions: No Patient Has Alerts: Yes Patient Alerts: Diabetes II Electronic Signature([name]) Signed: 12/02/2022 4:52:24 PM By: Rosalio Loud MSN RN CNS WTA Previous Signature: 12/02/2022 11:49:08 AM Version By: Rosalio Loud MSN RN CNS WTA Entered By: Rosalio Loud on 12/02/2022 16:52:24 -------------------------------------------------------------------------------- Clinic Level of Care Assessment Details Patient Name: Date of Service: DO Jenna [name]. 12/02/2022 11:30 A M Medical Record Number: [id number] Patient Account Number: [id number] Date of Birth/Sex: Treating RN: Jenna [name][birth year] (88 y.o. [name] Primary Care Majestic Molony: [name], [place] Other Clinician: Referring Chalee Hirota: Treating Kylii Ennis/Extender: Juel Burrow, [place] Weeks in Treatment: 1 Clinic Level of Care Assessment Items TOOL 4 Quantity Score X- 1 0 Use when only an EandM is performed on FOLLOW-UP  visit ASSESSMENTS - Nursing Assessment / Reassessment X- 1 10 Reassessment of Co-morbidities (includes updates in patient status) X- 1 5 Reassessment of Adherence to Treatment Plan ASSESSMENTS - Wound and Skin A ssessment / Reassessment X - Simple Wound Assessment / Reassessment - one wound 1 5 Jenna [name], Jenna [name] ([id number]) [id number] Page 2 of 10 '[]'$  - 0 Complex Wound Assessment / Reassessment - multiple wounds '[]'$  - 0 Dermatologic / Skin Assessment (not related to wound area) ASSESSMENTS - Focused Assessment '[]'$  - 0 Circumferential Edema Measurements - multi extremities '[]'$  - 0 Nutritional Assessment / Counseling / Intervention '[]'$  - 0 Lower Extremity Assessment (monofilament, tuning fork, pulses) '[]'$  - 0 Peripheral Arterial Disease Assessment (using hand held doppler) ASSESSMENTS - Ostomy and/or Continence Assessment and Care '[]'$  - 0 Incontinence Assessment and Management '[]'$  - 0 Ostomy Care Assessment and Management (repouching, etc.) PROCESS - Coordination of Care X - Simple Patient / Family Education for ongoing care 1 15 '[]'$  - 0 Complex (extensive) Patient / Family Education for ongoing care X- 1 10 Staff obtains Programmer, systems, Records, T Results / Process Orders est '[]'$  - 0 Staff telephones HHA, Nursing Homes / Clarify orders / etc '[]'$  - 0 Routine Transfer to another Facility (non-emergent condition) '[]'$  - 0 Routine Hospital Admission (non-emergent condition) '[]'$  - 0 New Admissions / Biomedical engineer / Ordering NPWT Apligraf, etc. , '[]'$  - 0 Emergency Hospital Admission (emergent condition) X- 1 10 Simple Discharge Coordination '[]'$  - 0 Complex (extensive) Discharge Coordination PROCESS - Special Needs '[]'$  - 0 Pediatric / Minor Patient Management '[]'$  - 0 Isolation Patient Management '[]'$  - 0 Hearing / Language / Visual special needs '[]'$  - 0 Assessment of Community assistance (transportation, D/C planning, etc.) '[]'$  - 0 Additional assistance /  Altered mentation '[]'$  - 0 Support Surface([name]) Assessment (bed, cushion, seat, etc.) INTERVENTIONS - Wound Cleansing / Measurement X - Simple Wound Cleansing - one wound 1 5 '[]'$  - 0 Complex  Jenna [name], Jenna [name] ([id number]) [id number] Page 1 of 10 Visit Report for 12/02/2022 Arrival Information Details Patient Name: Date of Service: DO Jenna [name]. 12/02/2022 11:30 A M Medical Record Number: [id number] Patient Account Number: [id number] Date of Birth/Sex: Treating RN: [birth year] (88 y.o. [name] Primary Care Rylynne Schicker: [name], [place] Other Clinician: Referring Enna Warwick: Treating Lawrence Mitch/Extender: Juel Burrow, [place] Weeks in Treatment: 1 Visit Information History Since Last Visit Added or deleted any medications: No Patient Arrived: Cane Any new allergies or adverse reactions: No Arrival Time: 11:33 Hospitalized since last visit: No Accompanied By: husband Implantable device outside of the clinic excluding No Transfer Assistance: None cellular tissue based products placed in the center Patient Identification Verified: Yes since last visit: Secondary Verification Process Completed: Yes Pain Present Now: No Patient Requires Transmission-Based Precautions: No Patient Has Alerts: Yes Patient Alerts: Diabetes II Electronic Signature([name]) Signed: 12/02/2022 4:52:24 PM By: Rosalio Loud MSN RN CNS WTA Previous Signature: 12/02/2022 11:49:08 AM Version By: Rosalio Loud MSN RN CNS WTA Entered By: Rosalio Loud on 12/02/2022 16:52:24 -------------------------------------------------------------------------------- Clinic Level of Care Assessment Details Patient Name: Date of Service: DO Jenna [name]. 12/02/2022 11:30 A M Medical Record Number: [id number] Patient Account Number: [id number] Date of Birth/Sex: Treating RN: Jenna [name][birth year] (88 y.o. [name] Primary Care Majestic Molony: [name], [place] Other Clinician: Referring Chalee Hirota: Treating Kylii Ennis/Extender: Juel Burrow, [place] Weeks in Treatment: 1 Clinic Level of Care Assessment Items TOOL 4 Quantity Score X- 1 0 Use when only an EandM is performed on FOLLOW-UP  visit ASSESSMENTS - Nursing Assessment / Reassessment X- 1 10 Reassessment of Co-morbidities (includes updates in patient status) X- 1 5 Reassessment of Adherence to Treatment Plan ASSESSMENTS - Wound and Skin A ssessment / Reassessment X - Simple Wound Assessment / Reassessment - one wound 1 5 Jenna [name], Jenna [name] ([id number]) [id number] Page 2 of 10 '[]'$  - 0 Complex Wound Assessment / Reassessment - multiple wounds '[]'$  - 0 Dermatologic / Skin Assessment (not related to wound area) ASSESSMENTS - Focused Assessment '[]'$  - 0 Circumferential Edema Measurements - multi extremities '[]'$  - 0 Nutritional Assessment / Counseling / Intervention '[]'$  - 0 Lower Extremity Assessment (monofilament, tuning fork, pulses) '[]'$  - 0 Peripheral Arterial Disease Assessment (using hand held doppler) ASSESSMENTS - Ostomy and/or Continence Assessment and Care '[]'$  - 0 Incontinence Assessment and Management '[]'$  - 0 Ostomy Care Assessment and Management (repouching, etc.) PROCESS - Coordination of Care X - Simple Patient / Family Education for ongoing care 1 15 '[]'$  - 0 Complex (extensive) Patient / Family Education for ongoing care X- 1 10 Staff obtains Programmer, systems, Records, T Results / Process Orders est '[]'$  - 0 Staff telephones HHA, Nursing Homes / Clarify orders / etc '[]'$  - 0 Routine Transfer to another Facility (non-emergent condition) '[]'$  - 0 Routine Hospital Admission (non-emergent condition) '[]'$  - 0 New Admissions / Biomedical engineer / Ordering NPWT Apligraf, etc. , '[]'$  - 0 Emergency Hospital Admission (emergent condition) X- 1 10 Simple Discharge Coordination '[]'$  - 0 Complex (extensive) Discharge Coordination PROCESS - Special Needs '[]'$  - 0 Pediatric / Minor Patient Management '[]'$  - 0 Isolation Patient Management '[]'$  - 0 Hearing / Language / Visual special needs '[]'$  - 0 Assessment of Community assistance (transportation, D/C planning, etc.) '[]'$  - 0 Additional assistance /  Altered mentation '[]'$  - 0 Support Surface([name]) Assessment (bed, cushion, seat, etc.) INTERVENTIONS - Wound Cleansing / Measurement X - Simple Wound Cleansing - one wound 1 5 '[]'$  - 0 Complex  Wound Cleansing - multiple wounds X- 1 5 Wound Imaging (photographs - any number of wounds) '[]'$  - 0 Wound Tracing (instead of photographs) X- 1 5 Simple Wound Measurement - one wound '[]'$  - 0 Complex Wound Measurement - multiple wounds INTERVENTIONS - Wound Dressings '[]'$  - 0 Small Wound Dressing one or multiple wounds X- 1 15 Medium Wound Dressing one or multiple wounds '[]'$  - 0 Large Wound Dressing one or multiple wounds '[]'$  - 0 Application of Medications - topical '[]'$  - 0 Application of Medications - injection INTERVENTIONS - Miscellaneous '[]'$  - 0 External ear exam '[]'$  - 0 Specimen Collection (cultures, biopsies, blood, body fluids, etc.) '[]'$  - 0 Specimen([name]) / Culture([name]) sent or taken to Lab for analysis Jenna [name], Jenna [name] ([id number]) [id number] Page 3 of 10 '[]'$  - 0 Patient Transfer (multiple staff / Civil Service fast streamer / Similar devices) '[]'$  - 0 Simple Staple / Suture removal (25 or less) '[]'$  - 0 Complex Staple / Suture removal ([name] or more) '[]'$  - 0 Hypo / Hyperglycemic Management (close monitor of Blood Glucose) '[]'$  - 0 Ankle / Brachial Index (ABI) - do not check if billed separately X- 1 5 Vital Signs Has the patient been seen at the hospital within the last three years: Yes Total Score: 90 Level Of Care: New/Established - Level 3 Electronic Signature([name]) Signed: 12/02/2022 5:09:56 PM By: Rosalio Loud MSN RN CNS WTA Entered By: Rosalio Loud on 12/02/2022 16:57:19 -------------------------------------------------------------------------------- Encounter Discharge Information Details Patient Name: Date of Service: DO Jenna [name]. 12/02/2022 11:30 A M Medical Record Number: [id number] Patient Account Number: [id number] Date of Birth/Sex: Treating RN: [birth year] Jenna [name] (88 y.o. [name] Primary Care Aubri Gathright: [name], [place] Other Clinician: Referring Eldana Isip: Treating Krina Mraz/Extender: Juel Burrow, [place] Weeks in Treatment: 1 Encounter Discharge Information Items Discharge Condition: Stable Ambulatory Status: Cane Discharge Destination: Home Transportation: Private Auto Accompanied By: self Schedule Follow-up Appointment: Yes Clinical Summary of Care: Electronic Signature([name]) Signed: 12/02/2022 4:58:29 PM By: Rosalio Loud MSN RN CNS WTA Previous Signature: 12/02/2022 11:52:06 AM Version By: Rosalio Loud MSN RN CNS WTA Entered By: Rosalio Loud on 12/02/2022 16:58:28 -------------------------------------------------------------------------------- Lower Extremity Assessment Details Patient Name: Date of Service: DO Jenna [name]. 12/02/2022 11:30 A M Medical Record Number: [id number] Patient Account Number: [id number] Date of Birth/Sex: Treating RN: [birth year]Jenna [name] (88 y.o. Jenna [name], Jenna [name], Jenna [name] ([id number]) [phone].pdf Page 4 of 10 Primary Care Armya Westerhoff: [place], [place] Other Clinician: Referring Kristeena Meineke: Treating Letrice Pollok/Extender: Juel Burrow, [place] Weeks in Treatment: 1 Edema Assessment Assessed: [Left: No] [Right: No] [Left: Edema] [Right: :] Calf Left: Right: Point of Measurement: 33 cm From Medial Instep 41 cm Ankle Left: Right: Point of Measurement: 10 cm From Medial Instep 24.5 cm Vascular Assessment Pulses: Dorsalis Pedis Palpable: [Left:Yes] Electronic Signature([name]) Signed: 12/02/2022 4:54:18 PM By: Rosalio Loud MSN RN CNS WTA Previous Signature: 12/02/2022 4:53:03 PM Version By: Rosalio Loud MSN RN CNS WTA Previous Signature: 12/02/2022 11:49:36 AM Version By: Rosalio Loud MSN RN CNS WTA Entered By: Rosalio Loud on 12/02/2022 16:54:17 -------------------------------------------------------------------------------- Multi Wound Chart Details Patient Name: Date of Service: DO Jenna [name]. 12/02/2022 11:30 A M Medical Record Number: [id number] Patient Account Number: [id number] Date of Birth/Sex: Treating RN: Jenna [name], Jenna [name] (88 y.o. [name] Primary Care Zayd Bonet: [name], [place] Other Clinician: Referring Talib Headley: Treating Kyera Felan/Extender: Juel Burrow, [place] Weeks in Treatment: 1 Vital Signs Height(in): 60 Pulse(bpm): 82 Weight(lbs): 200 Blood Pressure(mmHg): 191/78 Body Mass Index(BMI): 39.1 Temperature(F): 98.1 Respiratory Rate(breaths/min): 18 [

## 2022-12-05 NOTE — Progress Notes (Signed)
[name], [name] ([id number]) [id number] Page 1 of 4 Visit Report for 12/04/2022 Arrival Information Details Patient Name: Date of Service: DO Jenna [name] NNE S. 12/04/2022 4:00 PM Medical Record Number: [id number] Patient Account Number: [id number] Date of Birth/Sex: Treating RN: 07-09-1931 (88 y.o. Jenna [name] Primary Care Yoshika Vensel: Docia Chuck, Dibas Other Clinician: Referring Romari Gasparro: Treating Roy Snuffer/Extender: Renne Musca, Dibas Weeks in Treatment: 1 Visit Information History Since Last Visit Added or deleted any medications: No Patient Arrived: [place] Any new allergies or adverse reactions: No Arrival Time: 16:15 Had a fall or experienced change in No Accompanied By: self activities of daily living that may affect Transfer Assistance: None risk of falls: Patient Identification Verified: Yes Hospitalized since last visit: No Secondary Verification Process Completed: Yes Pain Present Now: No Patient Requires Transmission-Based Precautions: No Patient Has Alerts: Yes Patient Alerts: Diabetes II Electronic Signature(s) Signed: 12/05/2022 9:36:56 AM By: Midge Aver MSN RN CNS WTA Entered By: Midge Aver on 12/05/2022 09:36:55 -------------------------------------------------------------------------------- Clinic Level of Care Assessment Details Patient Name: Date of Service: DO Jenna [name] NNE S. 12/04/2022 4:00 PM Medical Record Number: [id number] Patient Account Number: [id number] Date of Birth/Sex: Treating RN: [birth year] (88 y.o. Jenna [name] Primary Care Venus Ruhe: Docia Chuck, Dibas Other Clinician: Referring Aranda Bihm: Treating Haru Anspaugh/Extender: Renne Musca, Dibas Weeks in Treatment: 1 Clinic Level of Care Assessment Items TOOL 4 Quantity Score X- 1 0 Use when only an EandM is performed on FOLLOW-UP visit ASSESSMENTS - Nursing Assessment / Reassessment X- 1 10 Reassessment of Co-morbidities (includes updates in patient  status) X- 1 5 Reassessment of Adherence to Treatment Plan ASSESSMENTS - Wound and Skin A ssessment / Reassessment X - Simple Wound Assessment / Reassessment - one wound 1 5 Jenna [name], Jenna [name] ([id number]) [id number] Page 2 of 4 []  - 0 Complex Wound Assessment / Reassessment - multiple wounds []  - 0 Dermatologic / Skin Assessment (not related to wound area) ASSESSMENTS - Focused Assessment []  - 0 Circumferential Edema Measurements - multi extremities []  - 0 Nutritional Assessment / Counseling / Intervention []  - 0 Lower Extremity Assessment (monofilament, tuning fork, pulses) []  - 0 Peripheral Arterial Disease Assessment (using hand held doppler) ASSESSMENTS - Ostomy and/or Continence Assessment and Care []  - 0 Incontinence Assessment and Management []  - 0 Ostomy Care Assessment and Management (repouching, etc.) PROCESS - Coordination of Care []  - 0 Simple Patient / Family Education for ongoing care []  - 0 Complex (extensive) Patient / Family Education for ongoing care X- 1 10 Staff obtains Chiropractor, Records, T Results / Process Orders est []  - 0 Staff telephones HHA, Nursing Homes / Clarify orders / etc []  - 0 Routine Transfer to another Facility (non-emergent condition) []  - 0 Routine Hospital Admission (non-emergent condition) []  - 0 New Admissions / Manufacturing engineer / Ordering NPWT Apligraf, etc. , []  - 0 Emergency Hospital Admission (emergent condition) X- 1 10 Simple Discharge Coordination []  - 0 Complex (extensive) Discharge Coordination PROCESS - Special Needs []  - 0 Pediatric / Minor Patient Management []  - 0 Isolation Patient Management []  - 0 Hearing / Language / Visual special needs []  - 0 Assessment of Community assistance (transportation, D/C planning, etc.) []  - 0 Additional assistance / Altered mentation []  - 0 Support Surface(s) Assessment (bed, cushion, seat, etc.) INTERVENTIONS - Wound Cleansing /  Measurement X - Simple Wound Cleansing - one wound 1 5 []  - 0 Complex Wound Cleansing - multiple wounds []  - 0 Wound Imaging (photographs - any number of wounds) []  -  0 Wound Tracing (instead of photographs) []  - 0 Simple Wound Measurement - one wound []  - 0 Complex Wound Measurement - multiple wounds INTERVENTIONS - Wound Dressings X - Small Wound Dressing one or multiple wounds 1 10 []  - 0 Medium Wound Dressing one or multiple wounds []  - 0 Large Wound Dressing one or multiple wounds X- 1 5 Application of Medications - topical []  - 0 Application of Medications - injection INTERVENTIONS - Miscellaneous []  - 0 External ear exam []  - 0 Specimen Collection (cultures, biopsies, blood, body fluids, etc.) []  - 0 Specimen(s) / Culture(s) sent or taken to Lab for analysis Jenna [name], Jenna [name] ([id number]) [id number] Page 3 of 4 []  - 0 Patient Transfer (multiple staff / Nurse, adult / Similar devices) []  - 0 Simple Staple / Suture removal (25 or less) []  - 0 Complex Staple / Suture removal (26 or more) []  - 0 Hypo / Hyperglycemic Management (close monitor of Blood Glucose) []  - 0 Ankle / Brachial Index (ABI) - do not check if billed separately []  - 0 Vital Signs Has the patient been seen at the hospital within the last three years: Yes Total Score: 60 Level Of Care: New/Established - Level 2 Electronic Signature(s) Signed: 12/05/2022 1:42:10 PM By: Midge Aver MSN RN CNS WTA Entered By: Midge Aver on 12/05/2022 09:39:24 -------------------------------------------------------------------------------- Encounter Discharge Information Details Patient Name: Date of Service: DO Jenna [name] NNE S. 12/04/2022 4:00 PM Medical Record Number: [id number] Patient Account Number: [id number] Date of Birth/Sex: Treating RN: 05/12/1931 (88 y.o. Jenna [name] Primary Care Jahzir Strohmeier: Docia Chuck, Dibas Other Clinician: Referring Rosaura Bolon: Treating Amor Hyle/Extender:  Renne Musca, Dibas Weeks in Treatment: 1 Encounter Discharge Information Items Discharge Condition: Stable Ambulatory Status: Cane Discharge Destination: Home Transportation: Private Auto Accompanied By: husband Schedule Follow-up Appointment: Yes Clinical Summary of Care: Electronic Signature(s) Signed: 12/05/2022 9:39:19 AM By: Midge Aver MSN RN CNS WTA Entered By: Midge Aver on 12/05/2022 09:39:18 -------------------------------------------------------------------------------- Wound Assessment Details Patient Name: Date of Service: DO Jenna [name] NNE S. 12/04/2022 4:00 PM Medical Record Number: [id number] Patient Account Number: [id number] Date of Birth/Sex: Treating RN: 1931-12-07 (88 y.o. Jenna [name] Primary Care Haddon Fyfe: Grannis, Dibas Other Clinician: JIANNI, Jenna [name] ([id number]) [id number] Page 4 of 4 Referring Seaton Hofmann: Treating Shanyn Preisler/Extender: Renne Musca, Dibas Weeks in Treatment: 1 Wound Status Wound Number: 6 Primary Diabetic Wound/Ulcer of the Lower Extremity Etiology: Wound Location: Left, Medial Lower Leg Wound Open Wounding Event: Gradually Appeared Status: Date Acquired: 10/29/2022 Comorbid Cataracts, Glaucoma, Lymphedema, Hypertension, Type II Weeks Of Treatment: 1 History: Diabetes, Osteoarthritis Clustered Wound: No Wound Measurements Length: (cm) 8 Width: (cm) 4.5 Depth: (cm) 0.2 Area: (cm) 28.274 Volume: (cm) 5.655 % Reduction in Area: -46.9% % Reduction in Volume: -193.9% Epithelialization: Small (1-33%) Wound Description Classification: Grade 2 Exudate Amount: Medium Exudate Type: Purulent Exudate Color: yellow, brown, green Foul Odor After Cleansing: Yes Due to Product Use: No Slough/Fibrino Yes Wound Bed Granulation Amount: Small (1-33%) Exposed Structure Granulation Quality: Red Fascia Exposed: No Necrotic Amount: Small (1-33%) Fat Layer (Subcutaneous Tissue) Exposed:  Yes Necrotic Quality: Adherent Slough Tendon Exposed: No Muscle Exposed: No Joint Exposed: No Bone Exposed: No Treatment Notes Wound #6 (Lower Leg) Wound Laterality: Left, Medial Cleanser Peri-Wound Care AandD Ointment Discharge Instruction: Apply AandD Ointment as directed Topical Gentamicin Discharge Instruction: Apply as directed by Jenna [name]. Primary Dressing Silvercel 4 1/4x 4 1/4 (in/in) Discharge Instruction: Apply Silvercel 4 1/4x 4 1/4 (in/in) as instructed Secondary Dressing Zetuvit Plus 4x8 (in/in) Secured  With Kerlix Roll Sterile or Non-Sterile 6-ply 4.5x4 (yd/yd) Discharge Instruction: Apply Kerlix as directed Compression Wrap 3-LAYER WRAP - Profore Lite LF 3 Multilayer Compression Bandaging System Discharge Instruction: Apply 3 multi-layer wrap as prescribed. Compression Stockings Add-Ons Electronic Signature(s) Signed: 12/05/2022 9:37:12 AM By: Midge Aver MSN RN CNS WTA Entered By: Midge Aver on 12/05/2022 09:37:12

## 2022-12-06 NOTE — Progress Notes (Signed)
AYANO, DOUTHITT ([id number]) [id number] Page 1 of 2 Visit Report for 12/04/2022 Physician Orders Details Patient Name: Date of Service: DO Jenna Floras NNE S. 12/04/2022 4:00 PM Medical Record Number: [id number] Patient Account Number: [id number] Date of Birth/Sex: Treating RN: [birth year] (88 y.o. [name] Primary Care Provider: [name], Dibas Other Clinician: Referring Provider: Treating Provider/Extender: Juel Burrow, Dibas Weeks in Treatment: 1 Verbal / Phone Orders: No Diagnosis Coding Follow-up Appointments Return Appointment in 1 week. Bathing/ Shower/ Hygiene No tub bath. - Do not get wrap wet Anesthetic (Use 'Patient Medications' Section for Anesthetic Order Entry) Lidocaine applied to wound bed Wound Treatment Wound #6 - Lower Leg Wound Laterality: Left, Medial Peri-Wound Care: AandD Ointment 1 x Per Week/30 Days Discharge Instructions: Apply AandD Ointment as directed Topical: Gentamicin 1 x Per Week/30 Days Discharge Instructions: Apply as directed by provider. Prim Dressing: Silvercel 4 1/4x 4 1/4 (in/in) 1 x Per Week/30 Days ary Discharge Instructions: Apply Silvercel 4 1/4x 4 1/4 (in/in) as instructed Secondary Dressing: Zetuvit Plus 4x8 (in/in) 1 x Per Week/30 Days Secured With: [employer] Sterile or Non-Sterile 6-ply 4.5x4 (yd/yd) 1 x Per Week/30 Days Discharge Instructions: Apply Kerlix as directed Compression Wrap: 3-LAYER WRAP - Profore Lite LF 3 Multilayer Compression Bandaging System 1 x Per Week/30 Days Discharge Instructions: Apply 3 multi-layer wrap as prescribed. Electronic Signature(s) Signed: 12/05/2022 9:37:36 AM By: Rosalio Loud MSN RN CNS WTA Signed: 12/05/2022 12:49:38 PM By: Worthy Keeler PA-C Entered By: Rosalio Loud on 12/05/2022 09:37:36 Ransier, [name] ([id number]) [id number] Page 2 of  2 -------------------------------------------------------------------------------- SuperBill Details Patient Name: Date of Service: DO Jenna Floras NNE S. 12/04/2022 Medical Record Number: [id number] Patient Account Number: [id number] Date of Birth/Sex: Treating RN: 1931/02/27 (88 y.o. [name] Primary Care Provider: [name], Dibas Other Clinician: Referring Provider: Treating Provider/Extender: Juel Burrow, Dibas Weeks in Treatment: 1 Diagnosis Coding ICD-10 Codes Code Description E11.622 Type 2 diabetes mellitus with other skin ulcer I87.332 Chronic venous hypertension (idiopathic) with ulcer and inflammation of left lower extremity I89.0 Lymphedema, not elsewhere classified L97.822 Non-pressure chronic ulcer of other part of left lower leg with fat layer exposed I10 Essential (primary) hypertension Facility Procedures : CPT4 Code: 27035009 Description: 38182 - WOUND CARE VISIT-LEV 2 EST PT Modifier: Quantity: 1 Electronic Signature(s) Signed: 12/05/2022 9:38:38 AM By: Rosalio Loud MSN RN CNS WTA Signed: 12/05/2022 12:49:38 PM By: Worthy Keeler PA-C Entered By: Rosalio Loud on 12/05/2022 09:38:38

## 2022-12-11 ENCOUNTER — Ambulatory Visit: Payer: Medicare Other

## 2022-12-11 ENCOUNTER — Encounter: Payer: Medicare Other | Admitting: Physician Assistant

## 2022-12-11 DIAGNOSIS — E11622 Type 2 diabetes mellitus with other skin ulcer: Secondary | ICD-10-CM | POA: Diagnosis not present

## 2022-12-11 DIAGNOSIS — I87332 Chronic venous hypertension (idiopathic) with ulcer and inflammation of left lower extremity: Secondary | ICD-10-CM | POA: Diagnosis not present

## 2022-12-11 DIAGNOSIS — L97822 Non-pressure chronic ulcer of other part of left lower leg with fat layer exposed: Secondary | ICD-10-CM | POA: Diagnosis not present

## 2022-12-11 DIAGNOSIS — I10 Essential (primary) hypertension: Secondary | ICD-10-CM | POA: Diagnosis not present

## 2022-12-11 DIAGNOSIS — I890 Lymphedema, not elsewhere classified: Secondary | ICD-10-CM | POA: Diagnosis not present

## 2022-12-11 NOTE — Progress Notes (Addendum)
SIERRIA, BRUNEY ([id number]) [id number] Page 1 of 9 Visit Report for 12/11/2022 Chief Complaint Document Details Patient Name: Date of Service: DO [name] NNE S. 12/11/2022 1:30 PM Medical Record Number: [id number] Patient Account Number: [id number] Date of Birth/Sex: Treating RN: 04/17/1931 (88 y.o. Orvan Falconer Primary Care Provider: [place], [place] Other Clinician: Massie Kluver Referring Provider: Treating Provider/Extender: Juel Burrow, [place] Weeks in Treatment: 2 Information Obtained from: Patient Chief Complaint Left LE Ulcer Electronic Signature(s) Signed: 12/11/2022 2:14:58 PM By: Worthy Keeler PA-C Entered By: Worthy Keeler on 12/11/2022 14:14:58 -------------------------------------------------------------------------------- HPI Details Patient Name: Date of Service: DO [name] NNE S. 12/11/2022 1:30 PM Medical Record Number: [id number] Patient Account Number: [id number] Date of Birth/Sex: Treating RN: 1931-01-15 (88 y.o. Orvan Falconer Primary Care Provider: [name], [place] Other Clinician: Massie Kluver Referring Provider: Treating Provider/Extender: Juel Burrow, [place] Weeks in Treatment: 2 History of Present Illness Location: left lower extremity in the medial ankle region Quality: Patient reports experiencing a dull pain to affected area(s). Severity: Patient states wound are getting worse. Duration: Patient has had the wound for > 3 months prior to seeking treatment at the wound center Timing: Pain in wound is Intermittent (comes and goes Context: The wound appeared gradually over time Modifying Factors: Other treatment(s) tried include:[name] ssociated Signs and Symptoms: Patient reports having increase swelling. A HPI Description: 88 year old patient with a past medical history significant for venous stasis ulceration and diabetes mellitus was  recently admitted to the hospital between 07/07/2016 and 07/09/2016. She was wound to have a nonpurulent cellulitis of the left lower extremity and was started on IV antibiotics which included vancomycin. He is discharged home with her and Unna's boots and oral doxycycline. During this admission there was no obvious DVT involving the left lower extremity. her past medical history significant for diabetes mellitus, hypertension, hyperlipidemia, varicose veins, status post right rotator cuff repair, knee surgery on the left,robotic abdominal hysterectomy, laparoscopic cholecystectomy. She is not a smoker. Venous study done on 07/18/2015 showed normal left extremity deep venous system with no evidence of DVT There was extensive valvular incompetence . and reflux throughout the left greater saphenous vein with direct the medication to subcutaneous varicose veins. Normal left small saphenous vein. JASA, DUNDON ([id number]) [id number] Page 2 of 9 I understand she was seen by vascular radiology group and the workup and recommendation was for endovenous ablation but due to family pressure she was not able to keep her appointment a year ago. She has not been wearing her compression stockings regularly. 05/26/18 READMISSION this is an 88 year old woman who was previously seen in this clinic in 2017 by Dr. Con Memos. She has chronic venous insufficiency with lymphedema and at that time had open area on the left medial calf and ankle area. Was recommended that she wear 20-30 mm compression stockings and the patient tells me that she has been compliant with this although I think her primary doctor recently told her that she didn't need to wear stockings out of fear it might cause arterial compression. She noticed edema and pain in the area a week to 2 ago. She saw her primary doctor and was given doxycycline and Silvadene cream to put on the area. She states things are a lot  better. The patient has a history of type 2 diabetes on oral agents but she is unaware of her hemoglobin A1c for her blood glucose which she doesn't check. She  has hypertension, varicose veins, rotator cuff repair, knee surgery on the left, history of a laparoscopic cholecystectomy, lymphedema, obstructive sleep apnea, history of endometrial CA. The patient has been to see vein and vascular in the past and I think has had an ablation of the left greater saphenous vein based on ultrasounds of the left leg IC dating back to 2017. Her most recent ultrasound was in May 2018 which showed no evidence of a DVT and continued durable closure of the treated segment of the left greater saphenous vein. Patent lateral accessory branch of the greater saphenous vein extending to the calf ABIs in our clinic were 1.05 on the right and 0.95 on the left 06/02/18 the patient's wound on the left medial lower calf is completely closed and epithelialized. She has new 20-30 mm below-knee stockings READMISSION 11/02/2019 This is a now 88 year old woman who has been in this clinic 2 times before. Most recently in 2019. She has chronic venous insufficiency with some degree of secondary lymphedema. She has had a history of a left greater saphenous vein ablation. When she is here in 2019 she had a wound on the left medial lower calf. This closed fairly easily. It was recommended she wear 20/30 mm below-knee stockings she is not compliant with this. She arrives in clinic with a 2-week history of a left medial lower extremity and ankle wound. Some of this has dry slough on it but most of it is already epithelialize she has been using a combination of Vaseline and/or Silvadene. She is not wearing any compression. She has a history of chronic venous reflux. There was recommendations in the past for ablations I do not know that she ever carried through with this. According to notes from 2016 this work-up was done via the interventional  radiology group. We will need to research this. She is probably going to need a consultation ABIs in our clinic were 1.03 on the right and 0.93 on the left 11/11; patient's wound looks as though it is epithelializing horizontal wound on the left medial lower extremity. She has a superior satellite lesion. She is complaining of a lot of pain in 3 layer compression. There have been recommendations for previous ablations I am not sure if she followed up on this. She has been noncompliant with stockings although she brought those into the clinic today. 11/18; the patient had a repeat venous reflux studies at vein and vascular. This did not show any DVT in the left lower extremity there was no evidence of chronic venous insufficiency and no evidence of superficial vein thrombosis. I looked back at previous studies done I think in interventional radiology in 2018 would suggest that there had been previous ablation of a segment of the left greater saphenous vein. By review of the new study I do not think the patient needs to see vein and vascular however I find these studies increasingly difficult to interpret. I am not sure that the patient has actually consistently worn compression stockings and I think that is the next step here. The wound is just about closed 11/22/2019 upon evaluation today patient actually appears to be healed based on what I am seeing today. She has been tolerating the dressing changes without complication. Fortunately there is no signs of active infection at this time. Overall I feel like even though this is the first time of seeing her that she has actually done extremely well with the current wound care measures again she has been under the care of  Dr. Dellia Nims. 12/2 the patient was expected to be healed this week however she comes in with 3 small wounds that look much the same as the pictures from 2 weeks ago. These are all in the area of the left medial lower extremity. She was put  into her own compression stockings last week I do not think the edema control was adequate in this area for healing. We have been using Hydrofera Blue 12/9; wound is fully epithelialized but still looks vulnerable on the left medial lower extremity. Surrounding venous inflammation. I think she has lymphedema with fibrosed skin in the distal lower leg. [Inverted bottle sign]. She has had venous reflux studies that have not shown any of the superficial veins to be amenable to ablations. This is been repeated during this visit. I am not really convinced that she has been wearing compression stockings although she certainly has them. 12/23; patient's wound on the left medial lower extremity is totally healed. She has surrounding venous inflammation and skin damage related to there is also some degree of lymphedema and fibrosed skin in the distal lower extremity. She has her compression stocking Readmission: 03/25/2021 upon evaluation today patient appears to be doing excellent in regard to her wound all things considered. She does appear to have gotten very quickly this time which is great news compared to before when she tells me she let the wound get somewhat out of control. Nonetheless right now she tells me this has been present for about 3 weeks she has been using Vaseline on it. She does have a history of diabetes though she has not taken Metformin for years she sees her primary care provider on Friday and she will see what he says but she is hoping he would not put her back on this. Subsequently she also does have Lasix that she is not been taking it recently. She has a past medical history significant for venous stasis, lymphedema, diabetes mellitus type 2, and hypertension. 04/01/2021 on evaluation today patient appears to be doing well with regard to her wound. There is no signs of active infection at this time. No fevers, chills, nausea, vomiting, or diarrhea. 04/08/2021 upon evaluation today  patient appears to be doing excellent in regard to her wounds currently. She has been tolerating the dressing changes and in fact appears to be completely healed which is great news. Readmission: 08/22/2021 patient presents today for reevaluation here in the clinic concerning a reopening of the wound on the left medial ankle/lower leg region which is the same area I took care of earlier in the year between March and April. Fortunately there does not appear to be anything to do a peer which is good news. I do think that as before this can probably heal fatherly rapidly. Nonetheless I do believe that based on what we are seeing she probably needs a compression wrap at this time. Her medical history really is not changed. Her ABI back in March of this year was 1.28 on this left leg and was doing well. 08/29/2021 upon evaluation today patient appears to be doing well. With regard to her wound she is actually showing signs of good granulation. I am very pleased in that regard. There does not appear to be any evidence of active infection at this time. 09/06/2021 upon evaluation today patient's wound actually showing signs of doing very well as far as granulation epithelization is concerned. Fortunately I do not see any need for sharp debridement today and very pleased in that  regard. There does seem to be some issue here with still continued drainage but nonetheless I think that the compression wrap is doing a very good job in helping to mitigate this as well. Overall I think that we are headed in an appropriate direction towards getting this completely closed. 09/13/2021 upon evaluation today patient appears to be doing better in regard to the overall measurement today. In fact the measurement really does not speak to as much as this is healed due to the fact that some of the areas that are open are somewhat scattered. In general I am extremely pleased with where we stand today with healing. The patient likewise  is also extremely happy with what she is seeing and hopefully will get this closed pretty soon. KAYDAN, WILHOITE ([id number]) [id number] Page 3 of 9 09/20/2021 upon evaluation today patient appears to be doing well at this point with regard to her wound. She has been tolerating the dressing changes without complication. Fortunately there does not appear to be any signs of active infection at this time. In fact I feel like this is probably completely healed. 09/30/2021 upon evaluation today patient actually appears to be doing quite well in regard to her wound. She has been tolerating the dressing changes without complication. Fortunately there does not appear to be any signs of active infection which is great news. No fevers, chills, nausea, vomiting, or diarrhea. Readmission: 12/06/2021 upon evaluation today patient appears to be doing decently well in regard to her left leg. She had previously had an issue here with cellulitis that she did go to an urgent care for and they placed her on doxycycline. The good news is this seems to have gotten much better. She tells me since I last saw her she has been wearing her compression socks and this happened despite that nonetheless. She does not take any fluid pills right now. Fortunately there is no signs of active infection locally nor systemically at this point. Readmission: 11-25-2022 upon evaluation today patient presents for reevaluation here in the clinic although it has been a little bit of time since I last saw her December 06, 2021 almost a year ago. With that being said in the past she is actually responding well to compression wrap she does have an area on the left medial lower extremity today which is her pretty common place for this to reopen. She was supposed of had a venous ablation on Monday but did not go due to the wound. Subsequently we are going to need to get something going here for her as far as trying to get  the area to heal effectively is concerned at this point. In regard to the patient's past medical history nothing has really changed significantly since she was last seeing here in the clinic about a year ago. 12-02-2022 upon evaluation today patient appears to be doing poorly currently in regard to her wound. In fact this is showing signs of infection for certain. Fortunately I do not see any signs of systemic infection but locally there is definitely signs of infection going on at this point. No fevers, chills, nausea, vomiting, or diarrhea. 12-11-2022 upon evaluation today patient appears to be doing well currently in regard to her wound. This is in fact showing signs of excellent improvement and very pleased with where we stand today. There does not appear to be any signs of active infection locally nor systemically at this time. No fevers, chills, nausea, vomiting, or diarrhea. Electronic Signature(s) Signed: 12/11/2022  2:41:36 PM By: Worthy Keeler PA-C Entered By: Worthy Keeler on 12/11/2022 14:41:36 -------------------------------------------------------------------------------- Physical Exam Details Patient Name: Date of Service: DO [name] NNE S. 12/11/2022 1:30 PM Medical Record Number: [id number] Patient Account Number: [id number] Date of Birth/Sex: Treating RN: [birth year] (88 y.o. Orvan Falconer Primary Care Provider: [place], [place] Other Clinician: Massie Kluver Referring Provider: Treating Provider/Extender: Juel Burrow, [place] Weeks in Treatment: 2 Constitutional Well-nourished and well-hydrated in no acute distress. Respiratory normal breathing without difficulty. Psychiatric this patient is able to make decisions and demonstrates good insight into disease process. Alert and Oriented x 3. pleasant and cooperative. Notes Upon inspection patient's wound bed actually showed signs of good granulation epithelization at this point. Fortunately I do not see any  signs of active infection at this time which is great news and overall I do believe we are headed in the right direction. No fevers, chills, nausea, vomiting, or diarrhea. Electronic Signature(s) Signed: 12/11/2022 2:42:09 PM By: Worthy Keeler PA-C Entered By: Worthy Keeler on 12/11/2022 14:42:08 [name] ([id number]) [id number] Page 4 of 9 -------------------------------------------------------------------------------- Physician Orders Details Patient Name: Date of Service: DO [name] NNE S. 12/11/2022 1:30 PM Medical Record Number: [id number] Patient Account Number: [id number] Date of Birth/Sex: Treating RN: 1931-10-19 (88 y.o. Orvan Falconer Primary Care Provider: [place], [place] Other Clinician: Massie Kluver Referring Provider: Treating Provider/Extender: Juel Burrow, [place] Weeks in Treatment: 2 Verbal / Phone Orders: No Diagnosis Coding ICD-10 Coding Code Description E11.622 Type 2 diabetes mellitus with other skin ulcer I87.332 Chronic venous hypertension (idiopathic) with ulcer and inflammation of left lower extremity I89.0 Lymphedema, not elsewhere classified L97.822 Non-pressure chronic ulcer of other part of left lower leg with fat layer exposed I10 Essential (primary) hypertension Follow-up Appointments Return Appointment in 1 week. Bathing/ Shower/ Hygiene No tub bath. - Do not get wrap wet Anesthetic (Use 'Patient Medications' Section for Anesthetic Order Entry) Lidocaine applied to wound bed Edema Control - Lymphedema / Segmental Compressive Device / Other Optional: One layer of unna paste to top of compression wrap (to act as an anchor). 3 Layer Compression System for Lymphedema. - left lower leg Wound Treatment Wound #6 - Lower Leg Wound Laterality: Left, Medial Peri-Wound Care: AandD Ointment 1 x Per Week/30 Days Discharge Instructions: Apply AandD Ointment as directed Topical: Gentamicin 1 x Per Week/30  Days Discharge Instructions: Apply as directed by provider. Prim Dressing: Silvercel 4 1/4x 4 1/4 (in/in) 1 x Per Week/30 Days ary Discharge Instructions: Apply Silvercel 4 1/4x 4 1/4 (in/in) as instructed Secondary Dressing: Zetuvit Plus 4x8 (in/in) 1 x Per Week/30 Days Secured With: [employer] Sterile or Non-Sterile 6-ply 4.5x4 (yd/yd) 1 x Per Week/30 Days Discharge Instructions: Apply Kerlix as directed Compression Wrap: 3-LAYER WRAP - Profore Lite LF 3 Multilayer Compression Bandaging System 1 x Per Week/30 Days Discharge Instructions: Apply 3 multi-layer wrap as prescribed. Electronic Signature(s) Signed: 12/11/2022 5:02:52 PM By: Worthy Keeler PA-C Signed: 12/16/2022 4:44:06 PM By: Massie Kluver Entered By: Massie Kluver on 12/11/2022 14:26:16 [name] ([id number]) [id number] Page 5 of 9 -------------------------------------------------------------------------------- Problem List Details Patient Name: Date of Service: DO [name] NNE S. 12/11/2022 1:30 PM Medical Record Number: [id number] Patient Account Number: [id number] Date of Birth/Sex: Treating RN: Apr 26, 1931 (88 y.o. Orvan Falconer Primary Care Provider: [name],  Other Clinician: Massie Kluver Referring Provider: Treating Provider/Extender: Juel Burrow, [place] Weeks in Treatment: 2 Active Problems ICD-10 Encounter Code Description Active Date MDM  Diagnosis E11.622 Type 2 diabetes mellitus with other skin ulcer 11/25/2022 No Yes I87.332 Chronic venous hypertension (idiopathic) with ulcer and inflammation of left 11/25/2022 No Yes lower extremity I89.0 Lymphedema, not elsewhere classified 11/25/2022 No Yes L97.822 Non-pressure chronic ulcer of other part of left lower leg with fat layer exposed11/28/2023 No Yes I10 Essential (primary) hypertension 11/25/2022 No Yes Inactive Problems Resolved Problems Electronic Signature(s) Signed: 12/11/2022 2:14:55 PM By:  Worthy Keeler PA-C Entered By: Worthy Keeler on 12/11/2022 14:14:55 -------------------------------------------------------------------------------- Progress Note Details Patient Name: Date of Service: DO [name] NNE S. 12/11/2022 1:30 PM Medical Record Number: [id number] Patient Account Number: [id number] Date of Birth/Sex: Treating RN: 04-29-1931 (88 y.o. [address], [place] ([id number]) [id number] Page 6 of 9 Primary Care Provider: [place], [place] Other Clinician: Massie Kluver Referring Provider: Treating Provider/Extender: Juel Burrow, [place] Weeks in Treatment: 2 Subjective Chief Complaint Information obtained from Patient Left LE Ulcer History of Present Illness (HPI) The following HPI elements were documented for the patient's wound: Location: left lower extremity in the medial ankle region Quality: Patient reports experiencing a dull pain to affected area(s). Severity: Patient states wound are getting worse. Duration: Patient has had the wound for > 3 months prior to seeking treatment at the wound center Timing: Pain in wound is Intermittent (comes and goes Context: The wound appeared gradually over time Modifying Factors: Other treatment(s) tried include:[name] Associated Signs and Symptoms: Patient reports having increase swelling. 88 year old patient with a past medical history significant for venous stasis ulceration and diabetes mellitus was recently admitted to the hospital between 07/07/2016 and 07/09/2016. She was wound to have a nonpurulent cellulitis of the left lower extremity and was started on IV antibiotics which included vancomycin. He is discharged home with her and Unna's boots and oral doxycycline. During this admission there was no obvious DVT involving the left lower extremity. her past medical history significant for diabetes mellitus,  hypertension, hyperlipidemia, varicose veins, status post right rotator cuff repair, knee surgery on the left,robotic abdominal hysterectomy, laparoscopic cholecystectomy. She is not a smoker. Venous study done on 07/18/2015 showed normal left extremity deep venous system with no evidence of DVT There was extensive valvular incompetence . and reflux throughout the left greater saphenous vein with direct the medication to subcutaneous varicose veins. Normal left small saphenous vein. I understand she was seen by vascular radiology group and the workup and recommendation was for endovenous ablation but due to family pressure she was not able to keep her appointment a year ago. She has not been wearing her compression stockings regularly. 05/26/18 READMISSION this is an 88 year old woman who was previously seen in this clinic in 2017 by Dr. Con Memos. She has chronic venous insufficiency with lymphedema and at that time had open area on the left medial calf and ankle area. Was recommended that she wear 20-30 mm compression stockings and the patient tells me that she has been compliant with this although I think her primary doctor recently told her that she didn't need to wear stockings out of fear it might cause arterial compression. She noticed edema and pain in the area a week to 2 ago. She saw her primary doctor and was given doxycycline and Silvadene cream to put on the area. She states things are a lot better. The patient has a history of type 2 diabetes on oral agents but she is unaware of her hemoglobin A1c for her blood glucose which  she doesn't check. She has hypertension, varicose veins, rotator cuff repair, knee surgery on the left, history of a laparoscopic cholecystectomy, lymphedema, obstructive sleep apnea, history of endometrial CA. The patient has been to see vein and vascular in the past and I think has had an ablation of the left greater saphenous vein based on ultrasounds of the  left leg IC dating back to 2017. Her most recent ultrasound was in May 2018 which showed no evidence of a DVT and continued durable closure of the treated segment of the left greater saphenous vein. Patent lateral accessory branch of the greater saphenous vein extending to the calf ABIs in our clinic were 1.05 on the right and 0.95 on the left 06/02/18 the patient's wound on the left medial lower calf is completely closed and epithelialized. She has new 20-30 mm below-knee stockings READMISSION 11/02/2019 This is a now 88 year old woman who has been in this clinic 2 times before. Most recently in 2019. She has chronic venous insufficiency with some degree of secondary lymphedema. She has had a history of a left greater saphenous vein ablation. When she is here in 2019 she had a wound on the left medial lower calf. This closed fairly easily. It was recommended she wear 20/30 mm below-knee stockings she is not compliant with this. She arrives in clinic with a 2-week history of a left medial lower extremity and ankle wound. Some of this has dry slough on it but most of it is already epithelialize she has been using a combination of Vaseline and/or Silvadene. She is not wearing any compression. She has a history of chronic venous reflux. There was recommendations in the past for ablations I do not know that she ever carried through with this. According to notes from 2016 this work-up was done via the interventional radiology group. We will need to research this. She is probably going to need a consultation ABIs in our clinic were 1.03 on the right and 0.93 on the left 11/11; patient's wound looks as though it is epithelializing horizontal wound on the left medial lower extremity. She has a superior satellite lesion. She is complaining of a lot of pain in 3 layer compression. There have been recommendations for previous ablations I am not sure if she followed up on this. She has been noncompliant with  stockings although she brought those into the clinic today. 11/18; the patient had a repeat venous reflux studies at vein and vascular. This did not show any DVT in the left lower extremity there was no evidence of chronic venous insufficiency and no evidence of superficial vein thrombosis. I looked back at previous studies done I think in interventional radiology in 2018 would suggest that there had been previous ablation of a segment of the left greater saphenous vein. By review of the new study I do not think the patient needs to see vein and vascular however I find these studies increasingly difficult to interpret. I am not sure that the patient has actually consistently worn compression stockings and I think that is the next step here. The wound is just about closed 11/22/2019 upon evaluation today patient actually appears to be healed based on what I am seeing today. She has been tolerating the dressing changes without complication. Fortunately there is no signs of active infection at this time. Overall I feel like even though this is the first time of seeing her that she has actually done extremely well with the current wound care measures again she has  been under the care of Dr. Dellia Nims. 12/2 the patient was expected to be healed this week however she comes in with 3 small wounds that look much the same as the pictures from 2 weeks ago. These are all in the area of the left medial lower extremity. She was put into her own compression stockings last week I do not think the edema control was adequate in this area for healing. We have been using Hydrofera Blue 12/9; wound is fully epithelialized but still looks vulnerable on the left medial lower extremity. Surrounding venous inflammation. I think she has lymphedema with fibrosed skin in the distal lower leg. [Inverted bottle sign]. She has had venous reflux studies that have not shown any of the superficial veins to be amenable to ablations. This  is been repeated during this visit. I am not really convinced that she has been wearing compression stockings although she certainly has them. 12/23; patient's wound on the left medial lower extremity is totally healed. She has surrounding venous inflammation and skin damage related to there is also some degree of lymphedema and fibrosed skin in the distal lower extremity. She has her compression stocking TAMI, BLASS ([id number]) [id number] Page 7 of 9 Readmission: 03/25/2021 upon evaluation today patient appears to be doing excellent in regard to her wound all things considered. She does appear to have gotten very quickly this time which is great news compared to before when she tells me she let the wound get somewhat out of control. Nonetheless right now she tells me this has been present for about 3 weeks she has been using Vaseline on it. She does have a history of diabetes though she has not taken Metformin for years she sees her primary care provider on Friday and she will see what he says but she is hoping he would not put her back on this. Subsequently she also does have Lasix that she is not been taking it recently. She has a past medical history significant for venous stasis, lymphedema, diabetes mellitus type 2, and hypertension. 04/01/2021 on evaluation today patient appears to be doing well with regard to her wound. There is no signs of active infection at this time. No fevers, chills, nausea, vomiting, or diarrhea. 04/08/2021 upon evaluation today patient appears to be doing excellent in regard to her wounds currently. She has been tolerating the dressing changes and in fact appears to be completely healed which is great news. Readmission: 08/22/2021 patient presents today for reevaluation here in the clinic concerning a reopening of the wound on the left medial ankle/lower leg region which is the same area I took care of earlier in the year between March  and April. Fortunately there does not appear to be anything to do a peer which is good news. I do think that as before this can probably heal fatherly rapidly. Nonetheless I do believe that based on what we are seeing she probably needs a compression wrap at this time. Her medical history really is not changed. Her ABI back in March of this year was 1.28 on this left leg and was doing well. 08/29/2021 upon evaluation today patient appears to be doing well. With regard to her wound she is actually showing signs of good granulation. I am very pleased in that regard. There does not appear to be any evidence of active infection at this time. 09/06/2021 upon evaluation today patient's wound actually showing signs of doing very well as far as granulation epithelization is concerned. Fortunately I  do not see any need for sharp debridement today and very pleased in that regard. There does seem to be some issue here with still continued drainage but nonetheless I think that the compression wrap is doing a very good job in helping to mitigate this as well. Overall I think that we are headed in an appropriate direction towards getting this completely closed. 09/13/2021 upon evaluation today patient appears to be doing better in regard to the overall measurement today. In fact the measurement really does not speak to as much as this is healed due to the fact that some of the areas that are open are somewhat scattered. In general I am extremely pleased with where we stand today with healing. The patient likewise is also extremely happy with what she is seeing and hopefully will get this closed pretty soon. 09/20/2021 upon evaluation today patient appears to be doing well at this point with regard to her wound. She has been tolerating the dressing changes without complication. Fortunately there does not appear to be any signs of active infection at this time. In fact I feel like this is probably completely  healed. 09/30/2021 upon evaluation today patient actually appears to be doing quite well in regard to her wound. She has been tolerating the dressing changes without complication. Fortunately there does not appear to be any signs of active infection which is great news. No fevers, chills, nausea, vomiting, or diarrhea. Readmission: 12/06/2021 upon evaluation today patient appears to be doing decently well in regard to her left leg. She had previously had an issue here with cellulitis that she did go to an urgent care for and they placed her on doxycycline. The good news is this seems to have gotten much better. She tells me since I last saw her she has been wearing her compression socks and this happened despite that nonetheless. She does not take any fluid pills right now. Fortunately there is no signs of active infection locally nor systemically at this point. Readmission: 11-25-2022 upon evaluation today patient presents for reevaluation here in the clinic although it has been a little bit of time since I last saw her December 06, 2021 almost a year ago. With that being said in the past she is actually responding well to compression wrap she does have an area on the left medial lower extremity today which is her pretty common place for this to reopen. She was supposed of had a venous ablation on Monday but did not go due to the wound. Subsequently we are going to need to get something going here for her as far as trying to get the area to heal effectively is concerned at this point. In regard to the patient's past medical history nothing has really changed significantly since she was last seeing here in the clinic about a year ago. 12-02-2022 upon evaluation today patient appears to be doing poorly currently in regard to her wound. In fact this is showing signs of infection for certain. Fortunately I do not see any signs of systemic infection but locally there is definitely signs of infection going on  at this point. No fevers, chills, nausea, vomiting, or diarrhea. 12-11-2022 upon evaluation today patient appears to be doing well currently in regard to her wound. This is in fact showing signs of excellent improvement and very pleased with where we stand today. There does not appear to be any signs of active infection locally nor systemically at this time. No fevers, chills, nausea, vomiting, or  diarrhea. Objective Constitutional Well-nourished and well-hydrated in no acute distress. Vitals Time Taken: 1:50 PM, Weight: 200 lbs, Temperature: 97.5 F, Pulse: 85 bpm, Respiratory Rate: 16 breaths/min, Blood Pressure: 185/74 mmHg. Respiratory normal breathing without difficulty. Psychiatric this patient is able to make decisions and demonstrates good insight into disease process. Alert and Oriented x 3. pleasant and cooperative. General Notes: Upon inspection patient's wound bed actually showed signs of good granulation epithelization at this point. Fortunately I do not see any signs of active infection at this time which is great news and overall I do believe we are headed in the right direction. No fevers, chills, nausea, vomiting, or diarrhea. Integumentary (Hair, Skin) KELIS, PLASSE ([id number]) [id number] Page 8 of 9 Wound #6 status is Open. Original cause of wound was Gradually Appeared. The date acquired was: 10/29/2022. The wound has been in treatment 2 weeks. The wound is located on the Left,Medial Lower Leg. The wound measures 6.8cm length x 4.5cm width x 0.1cm depth; 24.033cm^2 area and 2.403cm^3 volume. There is Fat Layer (Subcutaneous Tissue) exposed. There is no tunneling or undermining noted. There is a medium amount of purulent drainage noted. Foul odor after cleansing was noted. There is small (1-33%) red granulation within the wound bed. There is a small (1-33%) amount of necrotic tissue within the wound bed including Adherent  Slough. Assessment Active Problems ICD-10 Type 2 diabetes mellitus with other skin ulcer Chronic venous hypertension (idiopathic) with ulcer and inflammation of left lower extremity Lymphedema, not elsewhere classified Non-pressure chronic ulcer of other part of left lower leg with fat layer exposed Essential (primary) hypertension Procedures Wound #6 Pre-procedure diagnosis of Wound #6 is a Diabetic Wound/Ulcer of the Lower Extremity located on the Left,Medial Lower Leg . There was a Three Layer Compression Therapy Procedure with a pre-treatment ABI of 1.1 by Massie Kluver. Post procedure Diagnosis Wound #6: Same as Pre-Procedure Plan Follow-up Appointments: Return Appointment in 1 week. Bathing/ Shower/ Hygiene: No tub bath. - Do not get wrap wet Anesthetic (Use 'Patient Medications' Section for Anesthetic Order Entry): Lidocaine applied to wound bed Edema Control - Lymphedema / Segmental Compressive Device / Other: Optional: One layer of unna paste to top of compression wrap (to act as an anchor). 3 Layer Compression System for Lymphedema. - left lower leg WOUND #6: - Lower Leg Wound Laterality: Left, Medial Peri-Wound Care: AandD Ointment 1 x Per Week/30 Days Discharge Instructions: Apply AandD Ointment as directed Topical: Gentamicin 1 x Per Week/30 Days Discharge Instructions: Apply as directed by provider. Prim Dressing: Silvercel 4 1/4x 4 1/4 (in/in) 1 x Per Week/30 Days ary Discharge Instructions: Apply Silvercel 4 1/4x 4 1/4 (in/in) as instructed Secondary Dressing: Zetuvit Plus 4x8 (in/in) 1 x Per Week/30 Days Secured With: [employer] Sterile or Non-Sterile 6-ply 4.5x4 (yd/yd) 1 x Per Week/30 Days Discharge Instructions: Apply Kerlix as directed Com pression Wrap: 3-LAYER WRAP - Profore Lite LF 3 Multilayer Compression Bandaging System 1 x Per Week/30 Days Discharge Instructions: Apply 3 multi-layer wrap as prescribed. 1. Based on what I am seeing here I do believe  that the patient is tolerating the dressing changes without complication. Fortunately I do not see any signs of infection locally nor systemically which is great news. 2. I am also can recommend at this time that we have the patient continue with the 3 layer compression wrap which I feel like has been beneficial. I also think that she should continue with the silver cell which has done extremely well at  helping to drop this wound area. We will see patient back for reevaluation in 1 week here in the clinic. If anything worsens or changes patient will contact our office for additional recommendations. Electronic Signature(s) Signed: 12/11/2022 2:42:47 PM By: Worthy Keeler PA-C Entered By: Worthy Keeler on 12/11/2022 14:42:47 [name] ([id number]) [id number] Page 9 of 9 -------------------------------------------------------------------------------- SuperBill Details Patient Name: Date of Service: DO [name] NNE S. 12/11/2022 Medical Record Number: [id number] Patient Account Number: [id number] Date of Birth/Sex: Treating RN: 11-Dec-1931 (88 y.o. Orvan Falconer Primary Care Provider: Orcutt, Dibas Other Clinician: Massie Kluver Referring Provider: Treating Provider/Extender: Juel Burrow, Dibas Weeks in Treatment: 2 Diagnosis Coding ICD-10 Codes Code Description E11.622 Type 2 diabetes mellitus with other skin ulcer I87.332 Chronic venous hypertension (idiopathic) with ulcer and inflammation of left lower extremity I89.0 Lymphedema, not elsewhere classified L97.822 Non-pressure chronic ulcer of other part of left lower leg with fat layer exposed I10 Essential (primary) hypertension Facility Procedures : CPT4 Code: [id number] Description: (Facility Use Only) [id number] - [facility] LWR LT LEG ICD-10 Diagnosis Description I87.332 Chronic venous hypertension (idiopathic) with ulcer and inflammation of left lower e Modifier:  xtremity Quantity: 1 Physician Procedures : CPT4 Code Description Modifier [id number] - WC PHYS LEVEL 3 - EST PT ICD-10 Diagnosis Description E11.622 Type 2 diabetes mellitus with other skin ulcer I87.332 Chronic venous hypertension (idiopathic) with ulcer and inflammation of left lower  extremity I89.0 Lymphedema, not elsewhere classified L97.822 Non-pressure chronic ulcer of other part of left lower leg with fat layer exposed Quantity: 1 Electronic Signature(s) Signed: 12/11/2022 2:43:11 PM By: Worthy Keeler PA-C Entered By: Worthy Keeler on 12/11/2022 14:43:11

## 2022-12-16 NOTE — Progress Notes (Signed)
MASHELL, SIEBEN ([id number]) [id number] Page 1 of 9 Visit Report for 12/11/2022 Arrival Information Details Patient Name: Date of Service: DO [name] NNE S. 12/11/2022 1:30 PM Medical Record Number: [id number] Patient Account Number: [id number] Date of Birth/Sex: Treating RN: [birth year] (88 y.o. Orvan Falconer Primary Care Kourtnie Sachs: [name], [place] Other Clinician: Massie Kluver Referring Tensley Wery: Treating Isola Mehlman/Extender: Juel Burrow, [place] Weeks in Treatment: 2 Visit Information History Since Last Visit All ordered tests and consults were completed: No Patient Arrived: Kasandra Knudsen Added or deleted any medications: No Arrival Time: 13:45 Any new allergies or adverse reactions: No Transfer Assistance: None Had a fall or experienced change in No Patient Identification Verified: Yes activities of daily living that may affect Secondary Verification Process Completed: Yes risk of falls: Patient Requires Transmission-Based Precautions: No Signs or symptoms of abuse/neglect since last visito No Patient Has Alerts: Yes Hospitalized since last visit: No Patient Alerts: Diabetes II Implantable device outside of the clinic excluding No cellular tissue based products placed in the center since last visit: Has Dressing in Place as Prescribed: Yes Has Compression in Place as Prescribed: Yes Pain Present Now: No Electronic Signature(s) Signed: 12/16/2022 4:44:06 PM By: Massie Kluver Entered By: Massie Kluver on 12/11/2022 13:50:44 -------------------------------------------------------------------------------- Clinic Level of Care Assessment Details Patient Name: Date of Service: DO [name] NNE S. 12/11/2022 1:30 PM Medical Record Number: [id number] Patient Account Number: [id number] Date of Birth/Sex: Treating RN: January 25, 1931 (88 y.o. Orvan Falconer Primary Care Moreen Piggott: [name], [place] Other Clinician: Massie Kluver Referring  Amran Malter: Treating Jeanluc Wegman/Extender: Juel Burrow, [place] Weeks in Treatment: 2 Clinic Level of Care Assessment Items TOOL 1 Quantity Score '[]'$  - 0 Use when EandM and Procedure is performed on INITIAL visit ASSESSMENTS - Nursing Assessment / Reassessment '[]'$  - 0 General Physical Exam (combine w/ comprehensive assessment (listed just below) when performed on new pt. [address]SERI, KIMMER ([id number]) [id number] Page 2 of 9 '[]'$  - 0 Comprehensive Assessment (HX, ROS, Risk Assessments, Wounds Hx, etc.) ASSESSMENTS - Wound and Skin Assessment / Reassessment '[]'$  - 0 Dermatologic / Skin Assessment (not related to wound area) ASSESSMENTS - Ostomy and/or Continence Assessment and Care '[]'$  - 0 Incontinence Assessment and Management '[]'$  - 0 Ostomy Care Assessment and Management (repouching, etc.) PROCESS - Coordination of Care '[]'$  - 0 Simple Patient / Family Education for ongoing care '[]'$  - 0 Complex (extensive) Patient / Family Education for ongoing care '[]'$  - 0 Staff obtains Programmer, systems, Records, T Results / Process Orders est '[]'$  - 0 Staff telephones HHA, Nursing Homes / Clarify orders / etc '[]'$  - 0 Routine Transfer to another Facility (non-emergent condition) '[]'$  - 0 Routine Hospital Admission (non-emergent condition) '[]'$  - 0 New Admissions / Biomedical engineer / Ordering NPWT Apligraf, etc. , '[]'$  - 0 Emergency Hospital Admission (emergent condition) PROCESS - Special Needs '[]'$  - 0 Pediatric / Minor Patient Management '[]'$  - 0 Isolation Patient Management '[]'$  - 0 Hearing / Language / Visual special needs '[]'$  - 0 Assessment of Community assistance (transportation, D/C planning, etc.) '[]'$  - 0 Additional assistance / Altered mentation '[]'$  - 0 Support Surface(s) Assessment (bed, cushion, seat, etc.) INTERVENTIONS - Miscellaneous '[]'$  - 0 External ear exam '[]'$  - 0 Patient Transfer (multiple staff / Civil Service fast streamer / Similar devices) '[]'$  - 0 Simple Staple /  Suture removal (25 or less) '[]'$  - 0 Complex Staple / Suture removal (26 or more) '[]'$  - 0 Hypo/Hyperglycemic Management (do not check if billed separately) '[]'$  - 0 Ankle /  Brachial Index (ABI) - do not check if billed separately Has the patient been seen at the hospital within the last three years: Yes Total Score: 0 Level Of Care: ____ Electronic Signature(s) Signed: 12/16/2022 4:44:06 PM By: Massie Kluver Entered By: Massie Kluver on 12/11/2022 14:26:23 -------------------------------------------------------------------------------- Compression Therapy Details Patient Name: Date of Service: DO [name] NNE S. 12/11/2022 1:30 PM Medical Record Number: [id number] Patient Account Number: [id number] Date of Birth/Sex: Treating RN: 1931/09/09 (88 y.o. Orvan Falconer Primary Care Darik Massing: Bra[place], [place] Other Clinician: Delphine, Sizemore ([id number]) [id number] Page 3 of 9 Referring Haruo Stepanek: Treating Robbin Loughmiller/Extender: Juel Burrow, [place] Weeks in Treatment: 2 Compression Therapy Performed for Wound Assessment: Wound #6 Left,Medial Lower Leg Performed By: Lenice Pressman, Angie, Compression Type: Three Layer Pre Treatment ABI: 1.1 Post Procedure Diagnosis Same as Pre-procedure Electronic Signature(s) Signed: 12/16/2022 4:44:06 PM By: Massie Kluver Entered By: Massie Kluver on 12/11/2022 14:26:05 -------------------------------------------------------------------------------- Encounter Discharge Information Details Patient Name: Date of Service: DO [name] NNE S. 12/11/2022 1:30 PM Medical Record Number: [id number] Patient Account Number: [id number] Date of Birth/Sex: Treating RN: [birth year] (88 y.o. Orvan Falconer Primary Care Kanesha Cadle: [name], [place] Other Clinician: Massie Kluver Referring Ameshia Pewitt: Treating Sheretha Shadd/Extender: Juel Burrow, [place] Weeks in Treatment: 2 Encounter Discharge Information  Items Discharge Condition: Stable Ambulatory Status: Cane Discharge Destination: Home Transportation: Private Auto Accompanied By: self Schedule Follow-up Appointment: Yes Clinical Summary of Care: Electronic Signature(s) Signed: 12/16/2022 4:44:06 PM By: Massie Kluver Entered By: Massie Kluver on 12/11/2022 17:31:42 -------------------------------------------------------------------------------- Lower Extremity Assessment Details Patient Name: Date of Service: DO [name] NNE S. 12/11/2022 1:30 PM Medical Record Number: [id number] Patient Account Number: [id number] Date of Birth/Sex: Treating RN: 1931-09-05 (88 y.o. Orvan Falconer Primary Care Nimra Puccinelli: [place], [place] Other Clinician: Massie Kluver Referring Landon Truax: Treating Doyl Bitting/Extender: Juel Burrow, [place] Weeks in Treatment: 2 ERIONNA, STRUM ([id number]) [id number] Page 4 of 9 Edema Assessment Assessed: [Left: Yes] [Right: No] Edema: [Left: Ye] [Right: s] Calf Left: Right: Point of Measurement: 33 cm From Medial Instep 43.2 cm Ankle Left: Right: Point of Measurement: 10 cm From Medial Instep 24.5 cm Vascular Assessment Pulses: Dorsalis Pedis Palpable: [Left:Yes] Electronic Signature(s) Signed: 12/13/2022 3:52:05 PM By: Carlene Coria RN Signed: 12/16/2022 4:44:06 PM By: Massie Kluver Entered By: Massie Kluver on 12/11/2022 14:05:57 -------------------------------------------------------------------------------- Multi Wound Chart Details Patient Name: Date of Service: DO [name] NNE S. 12/11/2022 1:30 PM Medical Record Number: [id number] Patient Account Number: [id number] Date of Birth/Sex: Treating RN: 1931/03/07 (88 y.o. Orvan Falconer Primary Care Hebah Bogosian: [name], [place] Other Clinician: Massie Kluver Referring Ashawnti Tangen: Treating Tajai Ihde/Extender: Juel Burrow, [place] Weeks in Treatment: 2 Vital Signs Height(in): Pulse(bpm): 85 Weight(lbs):  200 Blood Pressure(mmHg): 185/74 Body Mass Index(BMI): Temperature(F): 97.5 Respiratory Rate(breaths/min): 16 [6:Photos:] [N/A:N/A] Left, Medial Lower Leg N/A N/A Wound Location: Gradually Appeared N/A N/A Wounding Event: Diabetic Wound/Ulcer of the Lower N/A N/A Primary Etiology: Extremity Cataracts, Glaucoma, Lymphedema, N/A N/A Comorbid History: Hypertension, Type II Diabetes, Osteoarthritis 10/29/2022 N/A N/A Date Acquired: MALLISSA, LORENZEN ([id number]) [id number] Page 5 of 9 2 N/A N/A Weeks of Treatment: Open N/A N/A Wound Status: No N/A N/A Wound Recurrence: 6.8x4.5x0.1 N/A N/A Measurements L x W x D (cm) 24.033 N/A N/A A (cm) : rea 2.403 N/A N/A Volume (cm) : -24.90% N/A N/A % Reduction in A rea: -24.90% N/A N/A % Reduction in Volume: Grade 2 N/A N/A Classification: Medium N/A N/A Exudate A mount: Purulent N/A N/A Exudate Type:  yellow, brown, green N/A N/A Exudate Color: Yes N/A N/A Foul Odor A Cleansing: fter No N/A N/A Odor A nticipated Due to Product Use: Small (1-33%) N/A N/A Granulation A mount: Red N/A N/A Granulation Quality: Small (1-33%) N/A N/A Necrotic A mount: Fat Layer (Subcutaneous Tissue): Yes N/A N/A Exposed Structures: Fascia: No Tendon: No Muscle: No Joint: No Bone: No Small (1-33%) N/A N/A Epithelialization: Treatment Notes Electronic Signature(s) Signed: 12/16/2022 4:44:06 PM By: Massie Kluver Entered By: Massie Kluver on 12/11/2022 14:06:04 -------------------------------------------------------------------------------- Multi-Disciplinary Care Plan Details Patient Name: Date of Service: DO [name] NNE S. 12/11/2022 1:30 PM Medical Record Number: [id number] Patient Account Number: [id number] Date of Birth/Sex: Treating RN: Jul 24, 1931 (88 y.o. Orvan Falconer Primary Care Jenilee Franey: [name], [place] Other Clinician: Massie Kluver Referring Mairlyn Tegtmeyer: Treating Damyen Knoll/Extender:  Juel Burrow, [place] Weeks in Treatment: 2 Active Inactive Orientation to the Wound Care Program Nursing Diagnoses: Knowledge deficit related to the wound healing center program Goals: Patient/caregiver will verbalize understanding of the [place] Program Date Initiated: 11/25/2022 Target Resolution Date: 12/06/2022 Goal Status: Active Interventions: Provide education on orientation to the wound center Notes: Wound/Skin Impairment Nursing Diagnoses: Impaired tissue integrity AURORA, RODY ([id number]) [id number] Page 6 of 9 Knowledge deficit related to ulceration/compromised skin integrity Goals: Patient will have a decrease in wound volume by X% from date: (specify in notes) Date Initiated: 11/25/2022 Target Resolution Date: 12/25/2022 Goal Status: Active Patient/caregiver will verbalize understanding of skin care regimen Date Initiated: 11/25/2022 Target Resolution Date: 12/25/2022 Goal Status: Active Ulcer/skin breakdown will have a volume reduction of 30% by week 4 Date Initiated: 11/25/2022 Target Resolution Date: 12/25/2022 Goal Status: Active Ulcer/skin breakdown will have a volume reduction of 50% by week 8 Date Initiated: 11/25/2022 Target Resolution Date: 01/25/2023 Goal Status: Active Ulcer/skin breakdown will have a volume reduction of 80% by week 12 Date Initiated: 11/25/2022 Target Resolution Date: 02/25/2023 Goal Status: Active Ulcer/skin breakdown will heal within 14 weeks Date Initiated: 11/25/2022 Target Resolution Date: 03/11/2023 Goal Status: Active Interventions: Assess patient/caregiver ability to obtain necessary supplies Assess patient/caregiver ability to perform ulcer/skin care regimen upon admission and as needed Assess ulceration(s) every visit Provide education on ulcer and skin care Treatment Activities: Skin care regimen initiated : 11/25/2022 Notes: Electronic Signature(s) Signed:  12/13/2022 3:52:05 PM By: Carlene Coria RN Signed: 12/16/2022 4:44:06 PM By: Massie Kluver Entered By: Massie Kluver on 12/11/2022 14:27:17 -------------------------------------------------------------------------------- Pain Assessment Details Patient Name: Date of Service: DO [name] NNE S. 12/11/2022 1:30 PM Medical Record Number: [id number] Patient Account Number: [id number] Date of Birth/Sex: Treating RN: 1931/12/07 (88 y.o. Orvan Falconer Primary Care Nasreen Goedecke: [place], [place] Other Clinician: Massie Kluver Referring Romani Wilbon: Treating Doxie Augenstein/Extender: Juel Burrow, [place] Weeks in Treatment: 2 Active Problems Location of Pain Severity and Description of Pain Patient Has Paino No Site Locations CHRISTIANNE, ZACHER ([id number]) [id number] Page 7 of 9 Pain Management and Medication Current Pain Management: Electronic Signature(s) Signed: 12/13/2022 3:52:05 PM By: Carlene Coria RN Signed: 12/16/2022 4:44:06 PM By: Massie Kluver Entered By: Massie Kluver on 12/11/2022 13:54:49 -------------------------------------------------------------------------------- Patient/Caregiver Education Details Patient Name: Date of Service: DO [name] 12/14/2023andnbsp1:30 PM Medical Record Number: [id number] Patient Account Number: [id number] Date of Birth/Gender: Treating RN: 1931/05/28 (88 y.o. Orvan Falconer Primary Care Physician: [name], [place] Other Clinician: Massie Kluver Referring Physician: Treating Physician/Extender: Juel Burrow, [place] Weeks in Treatment: 2 Education Assessment Education Provided To: Patient Education Topics Provided Wound/Skin Impairment: Handouts: Other: continue wound care as  yellow, brown, green N/A N/A Exudate Color: Yes N/A N/A Foul Odor A Cleansing: fter No N/A N/A Odor A nticipated Due to Product Use: Small (1-33%) N/A N/A Granulation A mount: Red N/A N/A Granulation Quality: Small (1-33%) N/A N/A Necrotic A mount: Fat Layer (Subcutaneous Tissue): Yes N/A N/A Exposed Structures: Fascia: No Tendon: No Muscle: No Joint: No Bone: No Small (1-33%) N/A N/A Epithelialization: Treatment Notes Electronic Signature(s) Signed: 12/16/2022 4:44:06 PM By: Massie Kluver Entered By: Massie Kluver on 12/11/2022 14:06:04 -------------------------------------------------------------------------------- Multi-Disciplinary Care Plan Details Patient Name: Date of Service: DO [name] NNE S. 12/11/2022 1:30 PM Medical Record Number: [id number] Patient Account Number: [id number] Date of Birth/Sex: Treating RN: Jul 24, 1931 (88 y.o. Orvan Falconer Primary Care Jenilee Franey: [name], [place] Other Clinician: Massie Kluver Referring Mairlyn Tegtmeyer: Treating [place]yen Knoll/Extender:  Juel Burrow, [place] Weeks in Treatment: 2 Active Inactive Orientation to the Wound Care Program Nursing Diagnoses: Knowledge deficit related to the wound healing center program Goals: Patient/caregiver will verbalize understanding of the [place] Program Date Initiated: 11/25/2022 Target Resolution Date: 12/06/2022 Goal Status: Active Interventions: Provide education on orientation to the wound center Notes: Wound/Skin Impairment Nursing Diagnoses: Impaired tissue integrity AURORA, RODY ([id number]) [id number] Page 6 of 9 Knowledge deficit related to ulceration/compromised skin integrity Goals: Patient will have a decrease in wound volume by X% from date: (specify in notes) Date Initiated: 11/25/2022 Target Resolution Date: 12/25/2022 Goal Status: Active Patient/caregiver will verbalize understanding of skin care regimen Date Initiated: 11/25/2022 Target Resolution Date: 12/25/2022 Goal Status: Active Ulcer/skin breakdown will have a volume reduction of 30% by week 4 Date Initiated: 11/25/2022 Target Resolution Date: 12/25/2022 Goal Status: Active Ulcer/skin breakdown will have a volume reduction of 50% by week 8 Date Initiated: 11/25/2022 Target Resolution Date: 01/25/2023 Goal Status: Active Ulcer/skin breakdown will have a volume reduction of 80% by week 12 Date Initiated: 11/25/2022 Target Resolution Date: 02/25/2023 Goal Status: Active Ulcer/skin breakdown will heal within 14 weeks Date Initiated: 11/25/2022 Target Resolution Date: 03/11/2023 Goal Status: Active Interventions: Assess patient/caregiver ability to obtain necessary supplies Assess patient/caregiver ability to perform ulcer/skin care regimen upon admission and as needed Assess ulceration(s) every visit Provide education on ulcer and skin care Treatment Activities: Skin care regimen initiated : 11/25/2022 Notes: Electronic Signature(s) Signed:  12/13/2022 3:52:05 PM By: Carlene Coria RN Signed: 12/16/2022 4:44:06 PM By: Massie Kluver Entered By: Massie Kluver on 12/11/2022 14:27:17 -------------------------------------------------------------------------------- Pain Assessment Details Patient Name: Date of Service: DO [name] NNE S. 12/11/2022 1:30 PM Medical Record Number: [id number] Patient Account Number: [id number] Date of Birth/Sex: Treating RN: 1931/12/07 (88 y.o. Orvan Falconer Primary Care Nasreen Goedecke: [place], [place] Other Clinician: Massie Kluver Referring Romani Wilbon: Treating Doxie Augenstein/Extender: Juel Burrow, [place] Weeks in Treatment: 2 Active Problems Location of Pain Severity and Description of Pain Patient Has Paino No Site Locations CHRISTIANNE, ZACHER ([id number]) [id number] Page 7 of 9 Pain Management and Medication Current Pain Management: Electronic Signature(s) Signed: 12/13/2022 3:52:05 PM By: Carlene Coria RN Signed: 12/16/2022 4:44:06 PM By: Massie Kluver Entered By: Massie Kluver on 12/11/2022 13:54:49 -------------------------------------------------------------------------------- Patient/Caregiver Education Details Patient Name: Date of Service: DO [name] 12/14/2023andnbsp1:30 PM Medical Record Number: [id number] Patient Account Number: [id number] Date of Birth/Gender: Treating RN: 1931/05/28 (88 y.o. Orvan Falconer Primary Care Physician: [name], [place] Other Clinician: Massie Kluver Referring Physician: Treating Physician/Extender: Juel Burrow, [place] Weeks in Treatment: 2 Education Assessment Education Provided To: Patient Education Topics Provided Wound/Skin Impairment: Handouts: Other: continue wound care as

## 2022-12-19 ENCOUNTER — Encounter: Payer: Medicare Other | Admitting: Physician Assistant

## 2022-12-19 DIAGNOSIS — I87332 Chronic venous hypertension (idiopathic) with ulcer and inflammation of left lower extremity: Secondary | ICD-10-CM | POA: Diagnosis not present

## 2022-12-19 DIAGNOSIS — I10 Essential (primary) hypertension: Secondary | ICD-10-CM | POA: Diagnosis not present

## 2022-12-19 DIAGNOSIS — E11622 Type 2 diabetes mellitus with other skin ulcer: Secondary | ICD-10-CM | POA: Diagnosis not present

## 2022-12-19 DIAGNOSIS — L97822 Non-pressure chronic ulcer of other part of left lower leg with fat layer exposed: Secondary | ICD-10-CM | POA: Diagnosis not present

## 2022-12-19 DIAGNOSIS — I890 Lymphedema, not elsewhere classified: Secondary | ICD-10-CM | POA: Diagnosis not present

## 2022-12-19 NOTE — Progress Notes (Signed)
ANIYHA, TATE ([id number]) [id number] Page 1 of 2 Visit Report for 12/19/2022 Chief Complaint Document Details Patient Name: Date of Service: DO Jenna [name] NNE S. 12/19/2022 10:00 A M Medical Record Number: [id number] Patient Account Number: [id number] Date of Birth/Sex: Treating RN: Dec 23, 1931 (88 y.o. Orvan Falconer Primary Care Provider: [name], [place] Other Clinician: Referring Provider: Treating Provider/Extender: Juel Burrow, [place] Weeks in Treatment: 3 Information Obtained from: Patient Chief Complaint Left LE Ulcer Electronic Signature(s) Signed: 12/19/2022 10:24:13 AM By: Worthy Keeler PA-C Entered By: Worthy Keeler on 12/19/2022 10:24:13 -------------------------------------------------------------------------------- Problem List Details Patient Name: Date of Service: DO Jenna [name] NNE S. 12/19/2022 10:00 A M Medical Record Number: [id number] Patient Account Number: [id number] Date of Birth/Sex: Treating RN: January 02, 1931 (88 y.o. 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

## 2022-12-23 NOTE — Progress Notes (Signed)
Jenna [name], Jenna [name] ([id number]) [id number] Page 1 of 9 Visit Report for 12/19/2022 Arrival Information Details Patient Name: Date of Service: DO Jenna Floras NNE S. 12/19/2022 10:00 A M Medical Record Number: [id number] Patient Account Number: [id number] Date of Birth/Sex: Treating RN: [birth year] (88 y.o. Orvan Falconer Primary Care Lorelee Mclaurin: [name], [place] Other Clinician: Referring Ilijah Doucet: Treating Adline Kirshenbaum/Extender: Juel Burrow, [place] Weeks in Treatment: 3 Visit Information History Since Last Visit Added or deleted any medications: No Patient Arrived: Ambulatory Any new allergies or adverse reactions: No Arrival Time: 10:09 Had a fall or experienced change in No Accompanied By: self activities of daily living that may affect Transfer Assistance: None risk of falls: Patient Identification Verified: Yes Signs or symptoms of abuse/neglect since last visito No Patient Requires Transmission-Based Precautions: No Hospitalized since last visit: No Patient Has Alerts: Yes Implantable device outside of the clinic excluding No Patient Alerts: Diabetes II cellular tissue based products placed in the center since last visit: Has Dressing in Place as Prescribed: Yes Has Compression in Place as Prescribed: Yes Pain Present Now: No Electronic Signature(s) Signed: 12/23/2022 4:50:51 PM By: Carlene Coria RN Entered By: Carlene Coria on 12/19/2022 10:14:08 -------------------------------------------------------------------------------- Clinic Level of Care Assessment Details Patient Name: Date of Service: DO Jenna Floras NNE S. 12/19/2022 10:00 A M Medical Record Number: [id number] Patient Account Number: [id number] Date of Birth/Sex: Treating RN: 04/17/1931 (88 y.o. Orvan Falconer Primary Care Akanksha Bellmore: [name], [place] Other Clinician: Referring Eren Ryser: Treating Zaccheus Edmister/Extender: Juel Burrow, [place] Weeks in Treatment: 3 Clinic Level of Care  Assessment Items TOOL 1 Quantity Score '[]'$  - 0 Use when EandM and Procedure is performed on INITIAL visit ASSESSMENTS - Nursing Assessment / Reassessment '[]'$  - 0 General Physical Exam (combine w/ comprehensive assessment (listed just below) when performed on new pt. evals) '[]'$  - 0 Comprehensive Assessment (HX, ROS, Risk Assessments, Wounds Hx, etc.) Jenna [name], Jenna [name] ([id number]) [id number] Page 2 of 9 ASSESSMENTS - Wound and Skin Assessment / Reassessment '[]'$  - 0 Dermatologic / Skin Assessment (not related to wound area) ASSESSMENTS - Ostomy and/or Continence Assessment and Care '[]'$  - 0 Incontinence Assessment and Management '[]'$  - 0 Ostomy Care Assessment and Management (repouching, etc.) PROCESS - Coordination of Care '[]'$  - 0 Simple Patient / Family Education for ongoing care '[]'$  - 0 Complex (extensive) Patient / Family Education for ongoing care '[]'$  - 0 Staff obtains Programmer, systems, Records, T Results / Process Orders est '[]'$  - 0 Staff telephones HHA, Nursing Homes / Clarify orders / etc '[]'$  - 0 Routine Transfer to another Facility (non-emergent condition) '[]'$  - 0 Routine Hospital Admission (non-emergent condition) '[]'$  - 0 New Admissions / Biomedical engineer / Ordering NPWT Apligraf, etc. , '[]'$  - 0 Emergency Hospital Admission (emergent condition) PROCESS - Special Needs '[]'$  - 0 Pediatric / Minor Patient Management '[]'$  - 0 Isolation Patient Management '[]'$  - 0 Hearing / Language / Visual special needs '[]'$  - 0 Assessment of Community assistance (transportation, D/C planning, etc.) '[]'$  - 0 Additional assistance / Altered mentation '[]'$  - 0 Support Surface(s) Assessment (bed, cushion, seat, etc.) INTERVENTIONS - Miscellaneous '[]'$  - 0 External ear exam '[]'$  - 0 Patient Transfer (multiple staff / Civil Service fast streamer / Similar devices) '[]'$  - 0 Simple Staple / Suture removal (25 or less) '[]'$  - 0 Complex Staple / Suture removal (26 or more) '[]'$  - 0 Hypo/Hyperglycemic  Management (do not check if billed separately) '[]'$  - 0 Ankle / Brachial Index (ABI) - do not check if billed separately  Carlene Coria Primary Care Zyra Parrillo: [name], [place] Other Clinician: Referring Laker Thompson: Treating Sander Remedios/Extender: Juel Burrow, [place] Weeks in Treatment: 3 Active Inactive Orientation to the Wound Care Program Nursing Diagnoses: Knowledge deficit related to the wound healing center program Goals: Patient/caregiver will verbalize understanding of the [place] Program Date Initiated: 11/25/2022 Target Resolution Date: 12/06/2022 Goal Status: Active Interventions: Provide education on orientation to the wound center Notes: Wound/Skin Impairment Nursing Diagnoses: Impaired tissue integrity Knowledge deficit related to ulceration/compromised skin integrity Goals: Patient will have a decrease in wound volume by X% from date: (specify in notes) Date Initiated: 11/25/2022 Target Resolution Date: 12/25/2022 Goal Status: Active Patient/caregiver will verbalize understanding of skin care regimen Date Initiated: 11/25/2022 Target Resolution Date: 12/25/2022 Goal Status: Active Ulcer/skin breakdown will have a volume reduction of 30% by week 4 Date Initiated: 11/25/2022 Target Resolution Date: 12/25/2022 Goal  Status: Active Ulcer/skin breakdown will have a volume reduction of 50% by week 8 Date Initiated: 11/25/2022 Target Resolution Date: 01/25/2023 Goal Status: Active Ulcer/skin breakdown will have a volume reduction of 80% by week 12 Date Initiated: 11/25/2022 Target Resolution Date: 02/25/2023 Goal Status: Active Ulcer/skin breakdown will heal within 14 weeks Date Initiated: 11/25/2022 Target Resolution Date: 03/11/2023 Goal Status: Active Interventions: Assess patient/caregiver ability to obtain necessary supplies Assess patient/caregiver ability to perform ulcer/skin care regimen upon admission and as needed Assess ulceration(s) every visit Provide education on ulcer and skin care Treatment Activities: Skin care regimen initiated : 11/25/2022 Notes: Jenna [name], Jenna [name] ([id number]) [id number] Page 6 of 9 Electronic Signature(s) Signed: 12/23/2022 4:50:51 PM By: Carlene Coria RN Entered By: Carlene Coria on 12/19/2022 10:30:59 -------------------------------------------------------------------------------- Pain Assessment Details Patient Name: Date of Service: DO Jenna Floras NNE S. 12/19/2022 10:00 A M Medical Record Number: [id number] Patient Account Number: [id number] Date of Birth/Sex: Treating RN: 1931/01/07 (88 y.o. Orvan Falconer Primary Care Ziza Hastings: [name], [place] Other Clinician: Referring Caison Hearn: Treating Chisum Habenicht/Extender: Juel Burrow, [place] Weeks in Treatment: 3 Active Problems Location of Pain Severity and Description of Pain Patient Has Paino No Site Locations Pain Management and Medication Current Pain Management: Electronic Signature(s) Signed: 12/23/2022 4:50:51 PM By: Carlene Coria RN Entered By: Carlene Coria on 12/19/2022 10:14:35 Patient/Caregiver Education Details -------------------------------------------------------------------------------- Jenna [name] ([id number]) [id number] Page 7 of  9 Patient Name: Date of Service: DO Jenna [name] 12/22/2023andnbsp10:00 A M Medical Record Number: [id number] Patient Account Number: [id number] Date of Birth/Gender: Treating RN: [birth year] (88 y.o. Orvan Falconer Primary Care Physician: [name], [place] Other Clinician: Referring Physician: Treating Physician/Extender: Juel Burrow, [place] Weeks in Treatment: 3 Education Assessment Education Provided To: Patient Education Topics Provided Wound/Skin Impairment: Methods: Explain/Verbal Responses: State content correctly Electronic Signature(s) Signed: 12/23/2022 4:50:51 PM By: Carlene Coria RN Entered By: Carlene Coria on 12/19/2022 10:30:54 -------------------------------------------------------------------------------- Wound Assessment Details Patient Name: Date of Service: DO Jenna Floras NNE S. 12/19/2022 10:00 A M Medical Record Number: [id number] Patient Account Number: [id number] Date of Birth/Sex: Treating RN: 01-Feb-1931 (88 y.o. Orvan Falconer Primary Care Noretta Frier: [name], [place] Other Clinician: Referring Karver Fadden: Treating Audrinna Sherman/Extender: Juel Burrow, [place] Weeks in Treatment: 3 Wound Status Wound Number: 6 Primary Diabetic Wound/Ulcer of the Lower Extremity Etiology: Wound Location: Left, Medial Lower Leg Wound Open Wounding Event: Gradually Appeared Status: Date Acquired: 10/29/2022 Comorbid Cataracts, Glaucoma, Lymphedema, Hypertension, Type II Weeks Of Treatment: 3 History: Diabetes, Osteoarthritis Clustered Wound: No Photos Wound Measurements Length: (cm) 5 Width: (cm) 2.5 Depth: (cm) 0.1 Area: (cm) 9.8 ANANI, GU ([id number]) Volume: (  Carlene Coria Primary Care Zyra Parrillo: [name], [place] Other Clinician: Referring Laker Thompson: Treating Sander Remedios/Extender: Juel Burrow, [place] Weeks in Treatment: 3 Active Inactive Orientation to the Wound Care Program Nursing Diagnoses: Knowledge deficit related to the wound healing center program Goals: Patient/caregiver will verbalize understanding of the [place] Program Date Initiated: 11/25/2022 Target Resolution Date: 12/06/2022 Goal Status: Active Interventions: Provide education on orientation to the wound center Notes: Wound/Skin Impairment Nursing Diagnoses: Impaired tissue integrity Knowledge deficit related to ulceration/compromised skin integrity Goals: Patient will have a decrease in wound volume by X% from date: (specify in notes) Date Initiated: 11/25/2022 Target Resolution Date: 12/25/2022 Goal Status: Active Patient/caregiver will verbalize understanding of skin care regimen Date Initiated: 11/25/2022 Target Resolution Date: 12/25/2022 Goal Status: Active Ulcer/skin breakdown will have a volume reduction of 30% by week 4 Date Initiated: 11/25/2022 Target Resolution Date: 12/25/2022 Goal  Status: Active Ulcer/skin breakdown will have a volume reduction of 50% by week 8 Date Initiated: 11/25/2022 Target Resolution Date: 01/25/2023 Goal Status: Active Ulcer/skin breakdown will have a volume reduction of 80% by week 12 Date Initiated: 11/25/2022 Target Resolution Date: 02/25/2023 Goal Status: Active Ulcer/skin breakdown will heal within 14 weeks Date Initiated: 11/25/2022 Target Resolution Date: 03/11/2023 Goal Status: Active Interventions: Assess patient/caregiver ability to obtain necessary supplies Assess patient/caregiver ability to perform ulcer/skin care regimen upon admission and as needed Assess ulceration(s) every visit Provide education on ulcer and skin care Treatment Activities: Skin care regimen initiated : 11/25/2022 Notes: Jenna [name], Jenna [name] ([id number]) [id number] Page 6 of 9 Electronic Signature(s) Signed: 12/23/2022 4:50:51 PM By: Carlene Coria RN Entered By: Carlene Coria on 12/19/2022 10:30:59 -------------------------------------------------------------------------------- Pain Assessment Details Patient Name: Date of Service: DO Jenna Floras NNE S. 12/19/2022 10:00 A M Medical Record Number: [id number] Patient Account Number: [id number] Date of Birth/Sex: Treating RN: 1931/01/07 (88 y.o. Orvan Falconer Primary Care Ziza Hastings: [name], [place] Other Clinician: Referring Caison Hearn: Treating Chisum Habenicht/Extender: Juel Burrow, [place] Weeks in Treatment: 3 Active Problems Location of Pain Severity and Description of Pain Patient Has Paino No Site Locations Pain Management and Medication Current Pain Management: Electronic Signature(s) Signed: 12/23/2022 4:50:51 PM By: Carlene Coria RN Entered By: Carlene Coria on 12/19/2022 10:14:35 Patient/Caregiver Education Details -------------------------------------------------------------------------------- Jenna [name] ([id number]) [id number] Page 7 of  9 Patient Name: Date of Service: DO Jenna [name] 12/22/2023andnbsp10:00 A M Medical Record Number: [id number] Patient Account Number: [id number] Date of Birth/Gender: Treating RN: [birth year] (88 y.o. Orvan Falconer Primary Care Physician: [name], [place] Other Clinician: Referring Physician: Treating Physician/Extender: Juel Burrow, [place] Weeks in Treatment: 3 Education Assessment Education Provided To: Patient Education Topics Provided Wound/Skin Impairment: Methods: Explain/Verbal Responses: State content correctly Electronic Signature(s) Signed: 12/23/2022 4:50:51 PM By: Carlene Coria RN Entered By: Carlene Coria on 12/19/2022 10:30:54 -------------------------------------------------------------------------------- Wound Assessment Details Patient Name: Date of Service: DO Jenna Floras NNE S. 12/19/2022 10:00 A M Medical Record Number: [id number] Patient Account Number: [id number] Date of Birth/Sex: Treating RN: 01-Feb-1931 (88 y.o. Orvan Falconer Primary Care Noretta Frier: [name], [place] Other Clinician: Referring Karver Fadden: Treating Audrinna Sherman/Extender: Juel Burrow, [place] Weeks in Treatment: 3 Wound Status Wound Number: 6 Primary Diabetic Wound/Ulcer of the Lower Extremity Etiology: Wound Location: Left, Medial Lower Leg Wound Open Wounding Event: Gradually Appeared Status: Date Acquired: 10/29/2022 Comorbid Cataracts, Glaucoma, Lymphedema, Hypertension, Type II Weeks Of Treatment: 3 History: Diabetes, Osteoarthritis Clustered Wound: No Photos Wound Measurements Length: (cm) 5 Width: (cm) 2.5 Depth: (cm) 0.1 Area: (cm) 9.8 ANANI, GU ([id number]) Volume: (  Jenna [name], Jenna [name] ([id number]) [id number] Page 1 of 9 Visit Report for 12/19/2022 Arrival Information Details Patient Name: Date of Service: DO Jenna Floras NNE S. 12/19/2022 10:00 A M Medical Record Number: [id number] Patient Account Number: [id number] Date of Birth/Sex: Treating RN: [birth year] (88 y.o. Orvan Falconer Primary Care Lorelee Mclaurin: [name], [place] Other Clinician: Referring Ilijah Doucet: Treating Adline Kirshenbaum/Extender: Juel Burrow, [place] Weeks in Treatment: 3 Visit Information History Since Last Visit Added or deleted any medications: No Patient Arrived: Ambulatory Any new allergies or adverse reactions: No Arrival Time: 10:09 Had a fall or experienced change in No Accompanied By: self activities of daily living that may affect Transfer Assistance: None risk of falls: Patient Identification Verified: Yes Signs or symptoms of abuse/neglect since last visito No Patient Requires Transmission-Based Precautions: No Hospitalized since last visit: No Patient Has Alerts: Yes Implantable device outside of the clinic excluding No Patient Alerts: Diabetes II cellular tissue based products placed in the center since last visit: Has Dressing in Place as Prescribed: Yes Has Compression in Place as Prescribed: Yes Pain Present Now: No Electronic Signature(s) Signed: 12/23/2022 4:50:51 PM By: Carlene Coria RN Entered By: Carlene Coria on 12/19/2022 10:14:08 -------------------------------------------------------------------------------- Clinic Level of Care Assessment Details Patient Name: Date of Service: DO Jenna Floras NNE S. 12/19/2022 10:00 A M Medical Record Number: [id number] Patient Account Number: [id number] Date of Birth/Sex: Treating RN: 04/17/1931 (88 y.o. Orvan Falconer Primary Care Akanksha Bellmore: [name], [place] Other Clinician: Referring Eren Ryser: Treating Zaccheus Edmister/Extender: Juel Burrow, [place] Weeks in Treatment: 3 Clinic Level of Care  Assessment Items TOOL 1 Quantity Score '[]'$  - 0 Use when EandM and Procedure is performed on INITIAL visit ASSESSMENTS - Nursing Assessment / Reassessment '[]'$  - 0 General Physical Exam (combine w/ comprehensive assessment (listed just below) when performed on new pt. evals) '[]'$  - 0 Comprehensive Assessment (HX, ROS, Risk Assessments, Wounds Hx, etc.) Jenna [name], Jenna [name] ([id number]) [id number] Page 2 of 9 ASSESSMENTS - Wound and Skin Assessment / Reassessment '[]'$  - 0 Dermatologic / Skin Assessment (not related to wound area) ASSESSMENTS - Ostomy and/or Continence Assessment and Care '[]'$  - 0 Incontinence Assessment and Management '[]'$  - 0 Ostomy Care Assessment and Management (repouching, etc.) PROCESS - Coordination of Care '[]'$  - 0 Simple Patient / Family Education for ongoing care '[]'$  - 0 Complex (extensive) Patient / Family Education for ongoing care '[]'$  - 0 Staff obtains Programmer, systems, Records, T Results / Process Orders est '[]'$  - 0 Staff telephones HHA, Nursing Homes / Clarify orders / etc '[]'$  - 0 Routine Transfer to another Facility (non-emergent condition) '[]'$  - 0 Routine Hospital Admission (non-emergent condition) '[]'$  - 0 New Admissions / Biomedical engineer / Ordering NPWT Apligraf, etc. , '[]'$  - 0 Emergency Hospital Admission (emergent condition) PROCESS - Special Needs '[]'$  - 0 Pediatric / Minor Patient Management '[]'$  - 0 Isolation Patient Management '[]'$  - 0 Hearing / Language / Visual special needs '[]'$  - 0 Assessment of Community assistance (transportation, D/C planning, etc.) '[]'$  - 0 Additional assistance / Altered mentation '[]'$  - 0 Support Surface(s) Assessment (bed, cushion, seat, etc.) INTERVENTIONS - Miscellaneous '[]'$  - 0 External ear exam '[]'$  - 0 Patient Transfer (multiple staff / Civil Service fast streamer / Similar devices) '[]'$  - 0 Simple Staple / Suture removal (25 or less) '[]'$  - 0 Complex Staple / Suture removal (26 or more) '[]'$  - 0 Hypo/Hyperglycemic  Management (do not check if billed separately) '[]'$  - 0 Ankle / Brachial Index (ABI) - do not check if billed separately

## 2022-12-24 ENCOUNTER — Encounter: Payer: Medicare Other | Admitting: Internal Medicine

## 2022-12-24 DIAGNOSIS — E11622 Type 2 diabetes mellitus with other skin ulcer: Secondary | ICD-10-CM | POA: Diagnosis not present

## 2022-12-24 DIAGNOSIS — L97822 Non-pressure chronic ulcer of other part of left lower leg with fat layer exposed: Secondary | ICD-10-CM | POA: Diagnosis not present

## 2022-12-24 DIAGNOSIS — I10 Essential (primary) hypertension: Secondary | ICD-10-CM | POA: Diagnosis not present

## 2022-12-24 DIAGNOSIS — I890 Lymphedema, not elsewhere classified: Secondary | ICD-10-CM | POA: Diagnosis not present

## 2022-12-24 DIAGNOSIS — I87332 Chronic venous hypertension (idiopathic) with ulcer and inflammation of left lower extremity: Secondary | ICD-10-CM | POA: Diagnosis not present

## 2022-12-24 NOTE — Progress Notes (Signed)
Jenna [name], Jenna [name] ([id number]) [id number] Page 1 of 8 Visit Report for 12/24/2022 Arrival Information Details Patient Name: Date of Service: DO Jenna [name] NNE S. 12/24/2022 11:15 A M Medical Record Number: [id number] Patient Account Number: [id number] Date of Birth/Sex: Treating RN: Nov 04, 1931 (88 y.o. [name] Primary Care Jenna [name]: Dorthy [name], Jenna Other Clinician: Referring Jenna [name]: Treating Jenna [name]/Extender: RO BSO N, Jenna [name], Jenna [name] in Treatment: 4 Visit Information History Since Last Visit Added or deleted any medications: No Patient Arrived: Cane Has Dressing in Place as Prescribed: Yes Arrival Time: 11:31 Pain Present Now: No Accompanied By: self Transfer Assistance: None Patient Identification Verified: Yes Secondary Verification Process Completed: Yes Patient Requires Transmission-Based Precautions: No Patient Has Alerts: Yes Patient Alerts: Diabetes II Electronic Signature(s) Signed: 12/24/2022 12:29:49 PM By: Gretta Cool, [name], RN, CWS, [name], [name] Entered By: Gretta Cool, [name], RN, CWS, Kim on 12/24/2022 11:32:14 -------------------------------------------------------------------------------- Clinic Level of Care Assessment Details Patient Name: Date of Service: DO Jenna [name] NNE S. 12/24/2022 11:15 A M Medical Record Number: [id number] Patient Account Number: [id number] Date of Birth/Sex: Treating RN: 03-Oct-1931 (88 y.o. [name] Primary Care Jenna [name]: Dorthy [name], Jenna Other Clinician: Referring Jenna [name]: Treating Tailor Lucking/Extender: RO BSO N, Jenna [name], Jenna [name] in Treatment: 4 Clinic Level of Care Assessment Items TOOL 4 Quantity Score '[]'$  - 0 Use when only an EandM is performed on FOLLOW-UP visit ASSESSMENTS - Nursing Assessment / Reassessment X- 1 10 Reassessment of Co-morbidities (includes updates in patient status) X- 1 5 Reassessment of Adherence to Treatment Plan ASSESSMENTS - Wound and Skin A  ssessment / Reassessment X - Simple Wound Assessment / Reassessment - one wound 1 5 Jenna [name], Jenna [name] ([id number]) [phone].pdf Page 2 of 8 '[]'$  - 0 Complex Wound Assessment / Reassessment - multiple wounds '[]'$  - 0 Dermatologic / Skin Assessment (not related to wound area) ASSESSMENTS - Focused Assessment '[]'$  - 0 Circumferential Edema Measurements - multi extremities '[]'$  - 0 Nutritional Assessment / Counseling / Intervention '[]'$  - 0 Lower Extremity Assessment (monofilament, tuning fork, pulses) '[]'$  - 0 Peripheral Arterial Disease Assessment (using hand held doppler) ASSESSMENTS - Ostomy and/or Continence Assessment and Care '[]'$  - 0 Incontinence Assessment and Management '[]'$  - 0 Ostomy Care Assessment and Management (repouching, etc.) PROCESS - Coordination of Care X - Simple Patient / Family Education for ongoing care 1 15 '[]'$  - 0 Complex (extensive) Patient / Family Education for ongoing care '[]'$  - 0 Staff obtains Programmer, systems, Records, T Results / Process Orders est '[]'$  - 0 Staff telephones HHA, Nursing Homes / Clarify orders / etc '[]'$  - 0 Routine Transfer to another Facility (non-emergent condition) '[]'$  - 0 Routine Hospital Admission (non-emergent condition) '[]'$  - 0 New Admissions / Biomedical engineer / Ordering NPWT Apligraf, etc. , '[]'$  - 0 Emergency Hospital Admission (emergent condition) X- 1 10 Simple Discharge Coordination '[]'$  - 0 Complex (extensive) Discharge Coordination PROCESS - Special Needs '[]'$  - 0 Pediatric / Minor Patient Management '[]'$  - 0 Isolation Patient Management '[]'$  - 0 Hearing / Language / Visual special needs '[]'$  - 0 Assessment of Community assistance (transportation, D/C planning, etc.) '[]'$  - 0 Additional assistance / Altered mentation '[]'$  - 0 Support Surface(s) Assessment (bed, cushion, seat, etc.) INTERVENTIONS - Wound Cleansing / Measurement X - Simple Wound Cleansing - one wound 1 5 '[]'$  - 0 Complex Wound Cleansing - multiple  wounds X- 1 5 Wound Imaging (photographs - any number of wounds) '[]'$  - 0 Wound Tracing (instead of photographs) X-  Jenna [name], Jenna [name] ([id number]) [id number] Page 1 of 8 Visit Report for 12/24/2022 Arrival Information Details Patient Name: Date of Service: DO Jenna [name] NNE S. 12/24/2022 11:15 A M Medical Record Number: [id number] Patient Account Number: [id number] Date of Birth/Sex: Treating RN: Nov 04, 1931 (88 y.o. [name] Primary Care Jenna [name]: Dorthy [name], Jenna Other Clinician: Referring Jenna [name]: Treating Jenna [name]/Extender: RO BSO N, Jenna [name], Jenna [name] in Treatment: 4 Visit Information History Since Last Visit Added or deleted any medications: No Patient Arrived: Cane Has Dressing in Place as Prescribed: Yes Arrival Time: 11:31 Pain Present Now: No Accompanied By: self Transfer Assistance: None Patient Identification Verified: Yes Secondary Verification Process Completed: Yes Patient Requires Transmission-Based Precautions: No Patient Has Alerts: Yes Patient Alerts: Diabetes II Electronic Signature(s) Signed: 12/24/2022 12:29:49 PM By: Gretta Cool, [name], RN, CWS, [name], [name] Entered By: Gretta Cool, [name], RN, CWS, Kim on 12/24/2022 11:32:14 -------------------------------------------------------------------------------- Clinic Level of Care Assessment Details Patient Name: Date of Service: DO Jenna [name] NNE S. 12/24/2022 11:15 A M Medical Record Number: [id number] Patient Account Number: [id number] Date of Birth/Sex: Treating RN: 03-Oct-1931 (88 y.o. [name] Primary Care Jenna [name]: Dorthy [name], Jenna Other Clinician: Referring Jenna [name]: Treating Tailor Lucking/Extender: RO BSO N, Jenna [name], Jenna [name] in Treatment: 4 Clinic Level of Care Assessment Items TOOL 4 Quantity Score '[]'$  - 0 Use when only an EandM is performed on FOLLOW-UP visit ASSESSMENTS - Nursing Assessment / Reassessment X- 1 10 Reassessment of Co-morbidities (includes updates in patient status) X- 1 5 Reassessment of Adherence to Treatment Plan ASSESSMENTS - Wound and Skin A  ssessment / Reassessment X - Simple Wound Assessment / Reassessment - one wound 1 5 Jenna [name], Jenna [name] ([id number]) [phone].pdf Page 2 of 8 '[]'$  - 0 Complex Wound Assessment / Reassessment - multiple wounds '[]'$  - 0 Dermatologic / Skin Assessment (not related to wound area) ASSESSMENTS - Focused Assessment '[]'$  - 0 Circumferential Edema Measurements - multi extremities '[]'$  - 0 Nutritional Assessment / Counseling / Intervention '[]'$  - 0 Lower Extremity Assessment (monofilament, tuning fork, pulses) '[]'$  - 0 Peripheral Arterial Disease Assessment (using hand held doppler) ASSESSMENTS - Ostomy and/or Continence Assessment and Care '[]'$  - 0 Incontinence Assessment and Management '[]'$  - 0 Ostomy Care Assessment and Management (repouching, etc.) PROCESS - Coordination of Care X - Simple Patient / Family Education for ongoing care 1 15 '[]'$  - 0 Complex (extensive) Patient / Family Education for ongoing care '[]'$  - 0 Staff obtains Programmer, systems, Records, T Results / Process Orders est '[]'$  - 0 Staff telephones HHA, Nursing Homes / Clarify orders / etc '[]'$  - 0 Routine Transfer to another Facility (non-emergent condition) '[]'$  - 0 Routine Hospital Admission (non-emergent condition) '[]'$  - 0 New Admissions / Biomedical engineer / Ordering NPWT Apligraf, etc. , '[]'$  - 0 Emergency Hospital Admission (emergent condition) X- 1 10 Simple Discharge Coordination '[]'$  - 0 Complex (extensive) Discharge Coordination PROCESS - Special Needs '[]'$  - 0 Pediatric / Minor Patient Management '[]'$  - 0 Isolation Patient Management '[]'$  - 0 Hearing / Language / Visual special needs '[]'$  - 0 Assessment of Community assistance (transportation, D/C planning, etc.) '[]'$  - 0 Additional assistance / Altered mentation '[]'$  - 0 Support Surface(s) Assessment (bed, cushion, seat, etc.) INTERVENTIONS - Wound Cleansing / Measurement X - Simple Wound Cleansing - one wound 1 5 '[]'$  - 0 Complex Wound Cleansing - multiple  wounds X- 1 5 Wound Imaging (photographs - any number of wounds) '[]'$  - 0 Wound Tracing (instead of photographs) X-  Jenna [name], Jenna [name] ([id number]) [id number] Page 1 of 8 Visit Report for 12/24/2022 Arrival Information Details Patient Name: Date of Service: DO Jenna [name] NNE S. 12/24/2022 11:15 A M Medical Record Number: [id number] Patient Account Number: [id number] Date of Birth/Sex: Treating RN: Nov 04, 1931 (88 y.o. [name] Primary Care Jenna [name]: Dorthy [name], Jenna Other Clinician: Referring Jenna [name]: Treating Jenna [name]/Extender: RO BSO N, Jenna [name], Jenna [name] in Treatment: 4 Visit Information History Since Last Visit Added or deleted any medications: No Patient Arrived: Cane Has Dressing in Place as Prescribed: Yes Arrival Time: 11:31 Pain Present Now: No Accompanied By: self Transfer Assistance: None Patient Identification Verified: Yes Secondary Verification Process Completed: Yes Patient Requires Transmission-Based Precautions: No Patient Has Alerts: Yes Patient Alerts: Diabetes II Electronic Signature(s) Signed: 12/24/2022 12:29:49 PM By: Gretta Cool, [name], RN, CWS, [name], [name] Entered By: Gretta Cool, [name], RN, CWS, Kim on 12/24/2022 11:32:14 -------------------------------------------------------------------------------- Clinic Level of Care Assessment Details Patient Name: Date of Service: DO Jenna [name] NNE S. 12/24/2022 11:15 A M Medical Record Number: [id number] Patient Account Number: [id number] Date of Birth/Sex: Treating RN: 03-Oct-1931 (88 y.o. [name] Primary Care Jenna [name]: Dorthy [name], Jenna Other Clinician: Referring Jenna [name]: Treating Tailor Lucking/Extender: RO BSO N, Jenna [name], Jenna [name] in Treatment: 4 Clinic Level of Care Assessment Items TOOL 4 Quantity Score '[]'$  - 0 Use when only an EandM is performed on FOLLOW-UP visit ASSESSMENTS - Nursing Assessment / Reassessment X- 1 10 Reassessment of Co-morbidities (includes updates in patient status) X- 1 5 Reassessment of Adherence to Treatment Plan ASSESSMENTS - Wound and Skin A  ssessment / Reassessment X - Simple Wound Assessment / Reassessment - one wound 1 5 Jenna [name], Jenna [name] ([id number]) [phone].pdf Page 2 of 8 '[]'$  - 0 Complex Wound Assessment / Reassessment - multiple wounds '[]'$  - 0 Dermatologic / Skin Assessment (not related to wound area) ASSESSMENTS - Focused Assessment '[]'$  - 0 Circumferential Edema Measurements - multi extremities '[]'$  - 0 Nutritional Assessment / Counseling / Intervention '[]'$  - 0 Lower Extremity Assessment (monofilament, tuning fork, pulses) '[]'$  - 0 Peripheral Arterial Disease Assessment (using hand held doppler) ASSESSMENTS - Ostomy and/or Continence Assessment and Care '[]'$  - 0 Incontinence Assessment and Management '[]'$  - 0 Ostomy Care Assessment and Management (repouching, etc.) PROCESS - Coordination of Care X - Simple Patient / Family Education for ongoing care 1 15 '[]'$  - 0 Complex (extensive) Patient / Family Education for ongoing care '[]'$  - 0 Staff obtains Programmer, systems, Records, T Results / Process Orders est '[]'$  - 0 Staff telephones HHA, Nursing Homes / Clarify orders / etc '[]'$  - 0 Routine Transfer to another Facility (non-emergent condition) '[]'$  - 0 Routine Hospital Admission (non-emergent condition) '[]'$  - 0 New Admissions / Biomedical engineer / Ordering NPWT Apligraf, etc. , '[]'$  - 0 Emergency Hospital Admission (emergent condition) X- 1 10 Simple Discharge Coordination '[]'$  - 0 Complex (extensive) Discharge Coordination PROCESS - Special Needs '[]'$  - 0 Pediatric / Minor Patient Management '[]'$  - 0 Isolation Patient Management '[]'$  - 0 Hearing / Language / Visual special needs '[]'$  - 0 Assessment of Community assistance (transportation, D/C planning, etc.) '[]'$  - 0 Additional assistance / Altered mentation '[]'$  - 0 Support Surface(s) Assessment (bed, cushion, seat, etc.) INTERVENTIONS - Wound Cleansing / Measurement X - Simple Wound Cleansing - one wound 1 5 '[]'$  - 0 Complex Wound Cleansing - multiple  wounds X- 1 5 Wound Imaging (photographs - any number of wounds) '[]'$  - 0 Wound Tracing (instead of photographs) X-  of 8 10/29/2022 N/A N/A Date Acquired: 4 N/A N/A [name] of Treatment: Healed - Epithelialized N/A N/A Wound Status: No N/A N/A Wound Recurrence: 0x0x0 N/A N/A Measurements L x W x D (cm) 0 N/A N/A A (cm) : rea 0 N/A N/A Volume (cm) : 100.00% N/A N/A % Reduction in A rea: 100.00% N/A N/A % Reduction in Volume: Grade 2 N/A N/A Classification: None Present N/A N/A Exudate A mount: None Present (0%) N/A N/A Granulation A mount: None Present (0%) N/A N/A Necrotic A mount: Fascia: No N/A N/A Exposed Structures: Fat Layer (Subcutaneous Tissue): No Tendon: No Muscle: No Joint: No Bone: No Large (67-100%) N/A N/A Epithelialization: Treatment Notes Electronic Signature(s) Signed: 12/24/2022 12:29:49 PM By: Gretta Cool, [name], RN, CWS, [name], [name] Entered By: Gretta Cool, [name], RN, CWS, Kim on 12/24/2022 12:03:56 -------------------------------------------------------------------------------- [place] Details Patient Name: Date of Service: DO Jenna [name] NNE S. 12/24/2022 11:15 A M Medical Record Number: [id number] Patient Account Number: [id number] Date of Birth/Sex: Treating RN: May 26, 1931 (88 y.o. [name] Primary Care Jaesean Litzau: Dorthy [name], Jenna Other Clinician: Referring Vela Render: Treating Adelard Sanon/Extender: RO BSO N, Alderson [name], Jenna [name] in Treatment: 4 Active Inactive Electronic Signature(s) Signed: 12/24/2022 12:29:49 PM By: Gretta Cool, [name], RN, CWS, [name], [name] Entered By: Gretta Cool, [name], RN, CWS,  Kim on 12/24/2022 10:07:12 -------------------------------------------------------------------------------- Pain Assessment Details Patient Name: Date of Service: DO Jenna [name] NNE S. 12/24/2022 11:15 A M Medical Record Number: [id number] Patient Account Number: [id number] Jenna [name], Jenna [name] ([id number]) [phone].pdf Page 6 of 8 Date of Birth/Sex: Treating RN: 1931/12/15 (88 y.o. [name] Primary Care Mendi Constable: Dorthy [name], Jenna Other Clinician: Referring Jasiah Elsen: Treating Baird Polinski/Extender: RO BSO N, Jenna [name], Jenna [name] in Treatment: 4 Active Problems Location of Pain Severity and Description of Pain Patient Has Paino No Site Locations Pain Management and Medication Current Pain Management: Notes Patient denies pain at this time. Electronic Signature(s) Signed: 12/24/2022 12:29:49 PM By: Gretta Cool, [name], RN, CWS, [name], [name] Entered By: Gretta Cool, [name], RN, CWS, Kim on 12/24/2022 11:34:17 -------------------------------------------------------------------------------- Patient/Caregiver Education Details Patient Name: Date of Service: DO [name] 12/27/2023andnbsp11:15 A M Medical Record Number: [id number] Patient Account Number: [id number] Date of Birth/Gender: Treating RN: 05-04-1931 (88 y.o. [name] Primary Care Physician: Dorthy [name], Jenna Other Clinician: Referring Physician: Treating Physician/Extender: RO BSO N, Laporte [name], Jenna [name] in Treatment: 4 Education Assessment Education Provided To: Patient Education Topics Provided Venous: Handouts: Controlling Swelling with Compression Stockings, Other: wear stockings daily Methods: Explain/Verbal Electronic Signature(s) Jenna [name], Jenna [name] ([id number]) [phone].pdf Page 7 of 8 Signed: 12/24/2022 12:29:49 PM By: Gretta Cool, [name], RN, CWS, [name], [name] Entered By: Gretta Cool, [name], RN, CWS, Kim on 12/24/2022  23:34:35 -------------------------------------------------------------------------------- Wound Assessment Details Patient Name: Date of Service: DO Jenna [name] NNE S. 12/24/2022 11:15 A M Medical Record Number: [id number] Patient Account Number: [id number] Date of Birth/Sex: Treating RN: 1931/01/05 (88 y.o. [name] Primary Care Chancy Claros: Dorthy [name], Jenna Other Clinician: Referring Nicholai Willette: Treating Alecsander Hattabaugh/Extender: RO BSO N, Jenna [name], Jenna [name] in Treatment: 4 Wound Status Wound Number: 6 Primary Diabetic Wound/Ulcer of the Lower Extremity Etiology: Wound Location: Left, Medial Lower Leg Wound Healed - Epithelialized Wounding Event: Gradually Appeared Status: Date Acquired: 10/29/2022 Comorbid Cataracts, Glaucoma, Lymphedema, Hypertension, Type II [name] Of Treatment: 4 History: Diabetes, Osteoarthritis Clustered Wound: No Photos Wound Measurements Length: (cm) Width: (cm) Depth: (cm) Area: (cm) Volume: (cm) 0 % Reduction in Area: 100% 0 %

## 2022-12-24 NOTE — Progress Notes (Signed)
[name], [name] ([id number]) [id number] Page 1 of 8 Visit Report for 12/24/2022 HPI Details Patient Name: Date of Service: DO Jenna [name] NNE S. 12/24/2022 11:15 A M Medical Record Number: [id number] Patient Account Number: [id number] Date of Birth/Sex: Treating RN: 04/03/1931 (88 y.o. Jenna [name] Primary Care Provider: Dorthy [name], Jenna Other Clinician: Referring Provider: Treating Provider/Extender: RO BSO N, D'Lo, Jenna [name] in Treatment: 4 History of Present Illness Location: left lower extremity in the medial ankle region Quality: Patient reports experiencing a dull pain to affected area(s). Severity: Patient states wound are getting worse. Duration: Patient has had the wound for > 3 months prior to seeking treatment at the wound center Timing: Pain in wound is Intermittent (comes and goes Context: The wound appeared gradually over time Modifying Factors: Other treatment(s) tried include:Jenna [name] admitted to hospital for IV antibiotics for cellulitis ssociated Signs and Symptoms: Patient reports having increase swelling. A HPI Description: 88 year old patient with a past medical history significant for venous stasis ulceration and diabetes mellitus was recently admitted to the hospital between 07/07/2016 and 07/09/2016. She was wound to have a nonpurulent cellulitis of the left lower extremity and was started on IV antibiotics which included vancomycin. He is discharged home with her and Unna's boots and oral doxycycline. During this admission there was no obvious DVT involving the left lower extremity. her past medical history significant for diabetes mellitus, hypertension, hyperlipidemia, varicose veins, status post right rotator cuff repair, knee surgery on the left,robotic abdominal hysterectomy, laparoscopic cholecystectomy. She is not a smoker. Venous study done on 07/18/2015 showed normal left extremity deep venous system with no  evidence of DVT There was extensive valvular incompetence . and reflux throughout the left greater saphenous vein with direct the medication to subcutaneous varicose veins. Normal left small saphenous vein. I understand she was seen by vascular radiology group and the workup and recommendation was for endovenous ablation but due to family pressure she was not able to keep her appointment a year ago. She has not been wearing her compression stockings regularly. 05/26/18 READMISSION this is an 88 year old woman who was previously seen in this clinic in 2017 by Dr. Con [name]. She has chronic venous insufficiency with lymphedema and at that time had open area on the left medial calf and ankle area. Was recommended that she wear 20-30 mm compression stockings and the patient tells me that she has been compliant with this although I think her primary doctor recently told her that she didn't need to wear stockings out of fear it might cause arterial compression. She noticed edema and pain in the area a week to 2 ago. She saw her primary doctor and was given doxycycline and Silvadene cream to put on the area. She states things are a lot better. The patient has a history of type 2 diabetes on oral agents but she is unaware of her hemoglobin A1c for her blood glucose which she doesn't check. She has hypertension, varicose veins, rotator cuff repair, knee surgery on the left, history of a laparoscopic cholecystectomy, lymphedema, obstructive sleep apnea, history of endometrial CA. The patient has been to see vein and vascular in the past and I think has had an ablation of the left greater saphenous vein based on ultrasounds of the left leg IC dating back to 2017. Her most recent ultrasound was in May 2018 which showed no evidence of a DVT and continued durable closure of the treated segment of the left greater saphenous vein. Patent lateral accessory  branch of the greater saphenous vein extending to the calf ABIs  in our clinic were 1.05 on the right and 0.95 on the left 06/02/18 the patient's wound on the left medial lower calf is completely closed and epithelialized. She has new 20-30 mm below-knee stockings READMISSION 11/02/2019 This is a now 88 year old woman who has been in this clinic 2 times before. Most recently in 2019. She has chronic venous insufficiency with some degree of secondary lymphedema. She has had a history of a left greater saphenous vein ablation. When she is here in 2019 she had a wound on the left medial lower calf. This closed fairly easily. It was recommended she wear 20/30 mm below-knee stockings she is not compliant with this. She arrives in clinic with a 2-week history of a left medial lower extremity and ankle wound. Some of this has dry slough on it but most of it is already epithelialize she has been using a combination of Vaseline and/or Silvadene. She is not wearing any compression. She has a history of chronic venous reflux. There was recommendations in the past for ablations I do not know that she ever carried through with this. According to notes from 2016 this work-up was done via the interventional radiology group. We will need to research this. She is probably going to need a consultation ABIs in our clinic were 1.03 on the right and 0.93 on the left 11/11; patient's wound looks as though it is epithelializing horizontal wound on the left medial lower extremity. She has a superior satellite lesion. She is complaining of a lot of pain in 3 layer compression. There have been recommendations for previous ablations I am not sure if she followed up on this. She has been noncompliant with stockings although she brought those into the clinic today. 11/18; the patient had a repeat venous reflux studies at vein and vascular. This did not show any DVT in the left lower extremity there was no evidence of Jenna [name] ([id number]) [phone].pdf Page 2  of 8 chronic venous insufficiency and no evidence of superficial vein thrombosis. I looked back at previous studies done I think in interventional radiology in 2018 would suggest that there had been previous ablation of a segment of the left greater saphenous vein. By review of the new study I do not think the patient needs to see vein and vascular however I find these studies increasingly difficult to interpret. I am not sure that the patient has actually consistently worn compression stockings and I think that is the next step here. The wound is just about closed 11/22/2019 upon evaluation today patient actually appears to be healed based on what I am seeing today. She has been tolerating the dressing changes without complication. Fortunately there is no signs of active infection at this time. Overall I feel like even though this is the first time of seeing her that she has actually done extremely well with the current wound care measures again she has been under the care of Dr. Dellia Nims. 12/2 the patient was expected to be healed this week however she comes in with 3 small wounds that look much the same as the pictures from 2 [name] ago. These are all in the area of the left medial lower extremity. She was put into her own compression stockings last week I do not think the edema control was adequate in this area for healing. We have been using Hydrofera Blue 12/9; wound is fully epithelialized but still looks vulnerable on  the left medial lower extremity. Surrounding venous inflammation. I think she has lymphedema with fibrosed skin in the distal lower leg. [Inverted bottle sign]. She has had venous reflux studies that have not shown any of the superficial veins to be amenable to ablations. This is been repeated during this visit. I am not really convinced that she has been wearing compression stockings although she certainly has them. 12/23; patient's wound on the left medial lower extremity is  totally healed. She has surrounding venous inflammation and skin damage related to there is also some degree of lymphedema and fibrosed skin in the distal lower extremity. She has her compression stocking Readmission: 03/25/2021 upon evaluation today patient appears to be doing excellent in regard to her wound all things considered. She does appear to have gotten very quickly this time which is great news compared to before when she tells me she let the wound get somewhat out of control. Nonetheless right now she tells me this has been present for about 3 [name] she has been using Vaseline on it. She does have a history of diabetes though she has not taken Metformin for years she sees her primary care provider on Friday and she will see what he says but she is hoping he would not put her back on this. Subsequently she also does have Lasix that she is not been taking it recently. She has a past medical history significant for venous stasis, lymphedema, diabetes mellitus type 2, and hypertension. 04/01/2021 on evaluation today patient appears to be doing well with regard to her wound. There is no signs of active infection at this time. No fevers, chills, nausea, vomiting, or diarrhea. 04/08/2021 upon evaluation today patient appears to be doing excellent in regard to her wounds currently. She has been tolerating the dressing changes and in fact appears to be completely healed which is great news. Readmission: 08/22/2021 patient presents today for reevaluation here in the clinic concerning a reopening of the wound on the left medial ankle/lower leg region which is the same area I took care of earlier in the year between March and April. Fortunately there does not appear to be anything to do a peer which is good news. I do think that as before this can probably heal fatherly rapidly. Nonetheless I do believe that based on what we are seeing she probably needs a compression wrap at this time. Her medical  history really is not changed. Her ABI back in March of this year was 1.28 on this left leg and was doing well. 08/29/2021 upon evaluation today patient appears to be doing well. With regard to her wound she is actually showing signs of good granulation. I am very pleased in that regard. There does not appear to be any evidence of active infection at this time. 09/06/2021 upon evaluation today patient's wound actually showing signs of doing very well as far as granulation epithelization is concerned. Fortunately I do not see any need for sharp debridement today and very pleased in that regard. There does seem to be some issue here with still continued drainage but nonetheless I think that the compression wrap is doing a very good job in helping to mitigate this as well. Overall I think that we are headed in an appropriate direction towards getting this completely closed. 09/13/2021 upon evaluation today patient appears to be doing better in regard to the overall measurement today. In fact the measurement really does not speak to as much as this is healed due to  the fact that some of the areas that are open are somewhat scattered. In general I am extremely pleased with where we stand today with healing. The patient likewise is also extremely happy with what she is seeing and hopefully will get this closed pretty soon. 09/20/2021 upon evaluation today patient appears to be doing well at this point with regard to her wound. She has been tolerating the dressing changes without complication. Fortunately there does not appear to be any signs of active infection at this time. In fact I feel like this is probably completely healed. 09/30/2021 upon evaluation today patient actually appears to be doing quite well in regard to her wound. She has been tolerating the dressing changes without complication. Fortunately there does not appear to be any signs of active infection which is great news. No fevers, chills, nausea,  vomiting, or diarrhea. Readmission: 12/06/2021 upon evaluation today patient appears to be doing decently well in regard to her left leg. She had previously had an issue here with cellulitis that she did go to an urgent care for and they placed her on doxycycline. The good news is this seems to have gotten much better. She tells me since I last saw her she has been wearing her compression socks and this happened despite that nonetheless. She does not take any fluid pills right now. Fortunately there is no signs of active infection locally nor systemically at this point. Readmission: 11-25-2022 upon evaluation today patient presents for reevaluation here in the clinic although it has been a little bit of time since I last saw her December 06, 2021 almost a year ago. With that being said in the past she is actually responding well to compression wrap she does have an area on the left medial lower extremity today which is her pretty common place for this to reopen. She was supposed of had a venous ablation on Monday but did not go due to the wound. Subsequently we are going to need to get something going here for her as far as trying to get the area to heal effectively is concerned at this point. In regard to the patient's past medical history nothing has really changed significantly since she was last seeing here in the clinic about a year ago. 12-02-2022 upon evaluation today patient appears to be doing poorly currently in regard to her wound. In fact this is showing signs of infection for certain. Fortunately I do not see any signs of systemic infection but locally there is definitely signs of infection going on at this point. No fevers, chills, nausea, vomiting, or diarrhea. 12-11-2022 upon evaluation today patient appears to be doing well currently in regard to her wound. This is in fact showing signs of excellent improvement and very pleased with where we stand today. There does not appear to be any  signs of active infection locally nor systemically at this time. No fevers, chills, nausea, vomiting, or diarrhea. 12-19-2022 upon evaluation today patient appears to be doing well currently in regard to her wound in fact this is almost completely healed and seems to be doing quite well. Fortunately I do not see any signs of infection locally nor systemically which is great news and overall I am extremely pleased with where things stand. No fevers, chills, nausea, vomiting, or diarrhea. 12/27; the wound on the left lower extremity distally is closed. Patient has chronic venous insufficiency probably some secondary lymphedema. She has a 20/30 mmHg stocking from elastic therapy in [employer]) Signed:  12/24/2022 4:44:55 PM By: Linton Ham MD Jenna [name], Jenna [name] [phone] By: Linton Ham MD [phone].pdf Page 3 of 8 Signed: 12/24/2022 4:44:55 Entered By: Linton Ham on 12/24/2022 12:34:24 -------------------------------------------------------------------------------- Physical Exam Details Patient Name: Date of Service: DO Jenna [name] NNE S. 12/24/2022 11:15 A M Medical Record Number: [id number] Patient Account Number: [id number] Date of Birth/Sex: Treating RN: 29-Nov-1931 (88 y.o. Jenna [name] Primary Care Provider: Dorthy [name], Jenna Other Clinician: Referring Provider: Treating Provider/Extender: RO BSO N, [place] Koirala, Jenna [name] in Treatment: 4 Constitutional Patient is hypertensive.. Pulse regular and within target range for patient.Marland Kitchen Respirations regular, non-labored and within target range.. Temperature is normal and within the target range for the patient.Marland Kitchen appears in no distress. Notes Wound exam; the area in question is completely closed. She has dry flaking skin in this area hemosiderin deposition Electronic Signature(s) Signed: 12/24/2022 4:44:55 PM By: Linton Ham MD Entered By: Linton Ham on 12/24/2022  12:37:22 -------------------------------------------------------------------------------- Physician Orders Details Patient Name: Date of Service: DO Jenna [name] NNE S. 12/24/2022 11:15 A M Medical Record Number: [id number] Patient Account Number: [id number] Date of Birth/Sex: Treating RN: 10-Jun-1931 (88 y.o. Jenna [name] Primary Care Provider: Dorthy [name], Jenna Other Clinician: Referring Provider: Treating Provider/Extender: RO BSO N, [place] G Koirala, Jenna [name] in Treatment: 4 Verbal / Phone Orders: No Diagnosis Coding Discharge From [facility] Discharge from [facility] Complete Wear compression garments daily. Put garments on first thing when you wake up and remove them before bed. Moisturize legs daily after removing compression garments. Elevate, Exercise Daily and A void Standing for Long Periods of Time. Electronic Signature(s) Signed: 12/24/2022 12:29:49 PM By: Gretta Cool, [name], RN, CWS, [name], [name] Signed: 12/24/2022 4:44:55 PM By: Linton Ham MD Entered By: Gretta Cool, [name], RN, CWS, Kim on 12/24/2022 12:04:24 Jenna [name], Jenna [name] ([id number]) [id number] Page 4 of 8 -------------------------------------------------------------------------------- Problem List Details Patient Name: Date of Service: DO Jenna [name] NNE S. 12/24/2022 11:15 A M Medical Record Number: [id number] Patient Account Number: [id number] Date of Birth/Sex: Treating RN: 09/20/1931 (88 y.o. Jenna [name] Primary Care Provider: Dorthy [name], Jenna Other Clinician: Referring Provider: Treating Provider/Extender: RO BSO N, [place], Jenna [name] in Treatment: 4 Active Problems ICD-10 Encounter Code Description Active Date MDM Diagnosis E11.622 Type 2 diabetes mellitus with other skin ulcer 11/25/2022 No Yes I87.332 Chronic venous hypertension (idiopathic) with ulcer and inflammation of left 11/25/2022 No Yes lower extremity I89.0 Lymphedema, not elsewhere  classified 11/25/2022 No Yes L97.822 Non-pressure chronic ulcer of other part of left lower leg with fat layer exposed11/28/2023 No Yes I10 Essential (primary) hypertension 11/25/2022 No Yes Inactive Problems Resolved Problems Electronic Signature(s) Signed: 12/24/2022 4:44:55 PM By: Linton Ham MD Entered By: Linton Ham on 12/24/2022 12:33:14 -------------------------------------------------------------------------------- Progress Note Details Patient Name: Date of Service: DO Jenna [name] NNE S. 12/24/2022 11:15 A M Medical Record Number: [id number] Patient Account Number: [id number] Date of Birth/Sex: Treating RN: May 30, 1931 (88 y.o. J[name] [place] ([id number]) [id number] Page 5 of 8 Primary Care Provider: Thayer, Jenna Other Clinician: Referring Provider: Treating Provider/Extender: RO BSO N, [place], Jenna [name] in Treatment: 4 Subjective History of Present Illness (HPI) The following HPI elements were documented for the patient's wound: Location: left lower extremity in the medial ankle region Quality: Patient reports experiencing a dull pain to affected area(s). Severity: Patient states wound are getting worse. Duration: Patient has had the wound for > 3 months prior to seeking treatment at  the wound center Timing: Pain in wound is Intermittent (comes and goes Context: The wound appeared gradually over time Modifying Factors: Other treatment(s) tried include:Jenna [name] admitted to hospital for IV antibiotics for cellulitis Associated Signs and Symptoms: Patient reports having increase swelling. 88 year old patient with a past medical history significant for venous stasis ulceration and diabetes mellitus was recently admitted to the hospital between 07/07/2016 and 07/09/2016. She was wound to have a nonpurulent cellulitis of the left lower extremity and was started on IV antibiotics which included vancomycin. He is  discharged home with her and Unna's boots and oral doxycycline. During this admission there was no obvious DVT involving the left lower extremity. her past medical history significant for diabetes mellitus, hypertension, hyperlipidemia, varicose veins, status post right rotator cuff repair, knee surgery on the left,robotic abdominal hysterectomy, laparoscopic cholecystectomy. She is not a smoker. Venous study done on 07/18/2015 showed normal left extremity deep venous system with no evidence of DVT There was extensive valvular incompetence . and reflux throughout the left greater saphenous vein with direct the medication to subcutaneous varicose veins. Normal left small saphenous vein. I understand she was seen by vascular radiology group and the workup and recommendation was for endovenous ablation but due to family pressure she was not able to keep her appointment a year ago. She has not been wearing her compression stockings regularly. 05/26/18 READMISSION this is an 88 year old woman who was previously seen in this clinic in 2017 by Dr. Con [name]. She has chronic venous insufficiency with lymphedema and at that time had open area on the left medial calf and ankle area. Was recommended that she wear 20-30 mm compression stockings and the patient tells me that she has been compliant with this although I think her primary doctor recently told her that she didn't need to wear stockings out of fear it might cause arterial compression. She noticed edema and pain in the area a week to 2 ago. She saw her primary doctor and was given doxycycline and Silvadene cream to put on the area. She states things are a lot better. The patient has a history of type 2 diabetes on oral agents but she is unaware of her hemoglobin A1c for her blood glucose which she doesn't check. She has hypertension, varicose veins, rotator cuff repair, knee surgery on the left, history of a laparoscopic cholecystectomy, lymphedema,  obstructive sleep apnea, history of endometrial CA. The patient has been to see vein and vascular in the past and I think has had an ablation of the left greater saphenous vein based on ultrasounds of the left leg IC dating back to 2017. Her most recent ultrasound was in May 2018 which showed no evidence of a DVT and continued durable closure of the treated segment of the left greater saphenous vein. Patent lateral accessory branch of the greater saphenous vein extending to the calf ABIs in our clinic were 1.05 on the right and 0.95 on the left 06/02/18 the patient's wound on the left medial lower calf is completely closed and epithelialized. She has new 20-30 mm below-knee stockings READMISSION 11/02/2019 This is a now 88 year old woman who has been in this clinic 2 times before. Most recently in 2019. She has chronic venous insufficiency with some degree of secondary lymphedema. She has had a history of a left greater saphenous vein ablation. When she is here in 2019 she had a wound on the left medial lower calf. This closed fairly easily. It was recommended she wear 20/30 mm below-knee stockings  she is not compliant with this. She arrives in clinic with a 2-week history of a left medial lower extremity and ankle wound. Some of this has dry slough on it but most of it is already epithelialize she has been using a combination of Vaseline and/or Silvadene. She is not wearing any compression. She has a history of chronic venous reflux. There was recommendations in the past for ablations I do not know that she ever carried through with this. According to notes from 2016 this work-up was done via the interventional radiology group. We will need to research this. She is probably going to need a consultation ABIs in our clinic were 1.03 on the right and 0.93 on the left 11/11; patient's wound looks as though it is epithelializing horizontal wound on the left medial lower extremity. She has a superior  satellite lesion. She is complaining of a lot of pain in 3 layer compression. There have been recommendations for previous ablations I am not sure if she followed up on this. She has been noncompliant with stockings although she brought those into the clinic today. 11/18; the patient had a repeat venous reflux studies at vein and vascular. This did not show any DVT in the left lower extremity there was no evidence of chronic venous insufficiency and no evidence of superficial vein thrombosis. I looked back at previous studies done I think in interventional radiology in 2018 would suggest that there had been previous ablation of a segment of the left greater saphenous vein. By review of the new study I do not think the patient needs to see vein and vascular however I find these studies increasingly difficult to interpret. I am not sure that the patient has actually consistently worn compression stockings and I think that is the next step here. The wound is just about closed 11/22/2019 upon evaluation today patient actually appears to be healed based on what I am seeing today. She has been tolerating the dressing changes without complication. Fortunately there is no signs of active infection at this time. Overall I feel like even though this is the first time of seeing her that she has actually done extremely well with the current wound care measures again she has been under the care of Dr. Dellia Nims. 12/2 the patient was expected to be healed this week however she comes in with 3 small wounds that look much the same as the pictures from 2 [name] ago. These are all in the area of the left medial lower extremity. She was put into her own compression stockings last week I do not think the edema control was adequate in this area for healing. We have been using Hydrofera Blue 12/9; wound is fully epithelialized but still looks vulnerable on the left medial lower extremity. Surrounding venous inflammation. I  think she has lymphedema with fibrosed skin in the distal lower leg. [Inverted bottle sign]. She has had venous reflux studies that have not shown any of the superficial veins to be amenable to ablations. This is been repeated during this visit. I am not really convinced that she has been wearing compression stockings although she certainly has them. 12/23; patient's wound on the left medial lower extremity is totally healed. She has surrounding venous inflammation and skin damage related to there is also some degree of lymphedema and fibrosed skin in the distal lower extremity. She has her compression stocking Readmission: 03/25/2021 upon evaluation today patient appears to be doing excellent in regard to her wound all things  considered. She does appear to have gotten very quickly this time which is great news compared to before when she tells me she let the wound get somewhat out of control. Nonetheless right now she tells me this has been present for about 3 [name] she has been using Vaseline on it. She does have a history of diabetes though she has not taken Metformin for years Jenna [name], Jenna [name] ([id number]) [id number] Page 6 of 8 she sees her primary care provider on Friday and she will see what he says but she is hoping he would not put her back on this. Subsequently she also does have Lasix that she is not been taking it recently. She has a past medical history significant for venous stasis, lymphedema, diabetes mellitus type 2, and hypertension. 04/01/2021 on evaluation today patient appears to be doing well with regard to her wound. There is no signs of active infection at this time. No fevers, chills, nausea, vomiting, or diarrhea. 04/08/2021 upon evaluation today patient appears to be doing excellent in regard to her wounds currently. She has been tolerating the dressing changes and in fact appears to be completely healed which is great  news. Readmission: 08/22/2021 patient presents today for reevaluation here in the clinic concerning a reopening of the wound on the left medial ankle/lower leg region which is the same area I took care of earlier in the year between March and April. Fortunately there does not appear to be anything to do a peer which is good news. I do think that as before this can probably heal fatherly rapidly. Nonetheless I do believe that based on what we are seeing she probably needs a compression wrap at this time. Her medical history really is not changed. Her ABI back in March of this year was 1.28 on this left leg and was doing well. 08/29/2021 upon evaluation today patient appears to be doing well. With regard to her wound she is actually showing signs of good granulation. I am very pleased in that regard. There does not appear to be any evidence of active infection at this time. 09/06/2021 upon evaluation today patient's wound actually showing signs of doing very well as far as granulation epithelization is concerned. Fortunately I do not see any need for sharp debridement today and very pleased in that regard. There does seem to be some issue here with still continued drainage but nonetheless I think that the compression wrap is doing a very good job in helping to mitigate this as well. Overall I think that we are headed in an appropriate direction towards getting this completely closed. 09/13/2021 upon evaluation today patient appears to be doing better in regard to the overall measurement today. In fact the measurement really does not speak to as much as this is healed due to the fact that some of the areas that are open are somewhat scattered. In general I am extremely pleased with where we stand today with healing. The patient likewise is also extremely happy with what she is seeing and hopefully will get this closed pretty soon. 09/20/2021 upon evaluation today patient appears to be doing well at this point  with regard to her wound. She has been tolerating the dressing changes without complication. Fortunately there does not appear to be any signs of active infection at this time. In fact I feel like this is probably completely healed. 09/30/2021 upon evaluation today patient actually appears to be doing quite well in regard to her wound. She has been tolerating  the dressing changes without complication. Fortunately there does not appear to be any signs of active infection which is great news. No fevers, chills, nausea, vomiting, or diarrhea. Readmission: 12/06/2021 upon evaluation today patient appears to be doing decently well in regard to her left leg. She had previously had an issue here with cellulitis that she did go to an urgent care for and they placed her on doxycycline. The good news is this seems to have gotten much better. She tells me since I last saw her she has been wearing her compression socks and this happened despite that nonetheless. She does not take any fluid pills right now. Fortunately there is no signs of active infection locally nor systemically at this point. Readmission: 11-25-2022 upon evaluation today patient presents for reevaluation here in the clinic although it has been a little bit of time since I last saw her December 06, 2021 almost a year ago. With that being said in the past she is actually responding well to compression wrap she does have an area on the left medial lower extremity today which is her pretty common place for this to reopen. She was supposed of had a venous ablation on Monday but did not go due to the wound. Subsequently we are going to need to get something going here for her as far as trying to get the area to heal effectively is concerned at this point. In regard to the patient's past medical history nothing has really changed significantly since she was last seeing here in the clinic about a year ago. 12-02-2022 upon evaluation today patient appears  to be doing poorly currently in regard to her wound. In fact this is showing signs of infection for certain. Fortunately I do not see any signs of systemic infection but locally there is definitely signs of infection going on at this point. No fevers, chills, nausea, vomiting, or diarrhea. 12-11-2022 upon evaluation today patient appears to be doing well currently in regard to her wound. This is in fact showing signs of excellent improvement and very pleased with where we stand today. There does not appear to be any signs of active infection locally nor systemically at this time. No fevers, chills, nausea, vomiting, or diarrhea. 12-19-2022 upon evaluation today patient appears to be doing well currently in regard to her wound in fact this is almost completely healed and seems to be doing quite well. Fortunately I do not see any signs of infection locally nor systemically which is great news and overall I am extremely pleased with where things stand. No fevers, chills, nausea, vomiting, or diarrhea. 12/27; the wound on the left lower extremity distally is closed. Patient has chronic venous insufficiency probably some secondary lymphedema. She has a 20/30 mmHg stocking from elastic therapy in [place] Objective Constitutional Patient is hypertensive.. Pulse regular and within target range for patient.Marland Kitchen Respirations regular, non-labored and within target range.. Temperature is normal and within the target range for the patient.Marland Kitchen appears in no distress. Vitals Time Taken: 11:33 AM, Weight: 200 lbs, Temperature: 98.1 F, Pulse: 72 bpm, Respiratory Rate: 16 breaths/min, Blood Pressure: 182/75 mmHg. General Notes: Wound exam; the area in question is completely closed. She has dry flaking skin in this area hemosiderin deposition Integumentary (Hair, Skin) Wound #6 status is Healed - Epithelialized. Original cause of wound was Gradually Appeared. The date acquired was: 10/29/2022. The wound has been  in treatment 4 [name]. The wound is located on the Left,Medial Lower Leg. The wound measures 0cm length  x 0cm width x 0cm depth; 0cm^2 area and 0cm^3 volume. There is no tunneling or undermining noted. There is a none present amount of drainage noted. There is no granulation within the wound bed. There is no necrotic tissue within the wound bed. Jenna [name], Jenna [name] ([id number]) [id number] Page 7 of 8 Assessment Active Problems ICD-10 Type 2 diabetes mellitus with other skin ulcer Chronic venous hypertension (idiopathic) with ulcer and inflammation of left lower extremity Lymphedema, not elsewhere classified Non-pressure chronic ulcer of other part of left lower leg with fat layer exposed Essential (primary) hypertension Plan Discharge From [facility] Services: Discharge from [place] Treatment Complete Wear compression garments daily. Put garments on first thing when you wake up and remove them before bed. Moisturize legs daily after removing compression garments. Elevate, Exercise Daily and Avoid Standing for Long Periods of Time. 1. The patient to be discharged from the clinic. She was given instructions on motioning the area around the left lower leg and ankle nightly applying the stocking in the morning. She only bought 1 compression stocking I advised her that this will only last for 5 months and she probably needs 1 for the right leg 2. Otherwise she can be discharged from the clinic Electronic Signature(s) Signed: 12/24/2022 4:44:55 PM By: Linton Ham MD Entered By: Linton Ham on 12/24/2022 12:38:08 -------------------------------------------------------------------------------- SuperBill Details Patient Name: Date of Service: DO Jenna [name] NNE S. 12/24/2022 Medical Record Number: [id number] Patient Account Number: [id number] Date of Birth/Sex: Treating RN: [birth year] (88 y.o. Jenna [name] Primary Care Provider: Dorthy [name], Jenna Other  Clinician: Referring Provider: Treating Provider/Extender: RO BSO N, Winters, Jenna [name] in Treatment: 4 Diagnosis Coding ICD-10 Codes Code Description E11.622 Type 2 diabetes mellitus with other skin ulcer I87.332 Chronic venous hypertension (idiopathic) with ulcer and inflammation of left lower extremity I89.0 Lymphedema, not elsewhere classified L97.822 Non-pressure chronic ulcer of other part of left lower leg with fat layer exposed I10 Essential (primary) hypertension Facility Procedures : Jenna [name], Jenna [name] Code: [id number] Jenna [name] ([id number]) Description: [phone] - WOUND CARE VISIT-LEV 2 EST PT [phone] Modifier: [id number] Quantity: 1 df Page 8 of 8 Physician Procedures : CPT4 Code Description Modifier [id number] - WC PHYS LEVEL 3 - EST PT ICD-10 Diagnosis Description E11.622 Type 2 diabetes mellitus with other skin ulcer L97.822 Non-pressure chronic ulcer of other part of left lower leg with fat layer exposed  I87.332 Chronic venous hypertension (idiopathic) with ulcer and inflammation of left lower extremity I89.0 Lymphedema, not elsewhere classified Quantity: 1 Electronic Signature(s) Signed: 12/24/2022 4:44:55 PM By: Linton Ham MD Entered By: Linton Ham on 12/24/2022 12:39:49

## 2022-12-28 DIAGNOSIS — E119 Type 2 diabetes mellitus without complications: Secondary | ICD-10-CM | POA: Diagnosis not present

## 2023-01-15 DIAGNOSIS — M25532 Pain in left wrist: Secondary | ICD-10-CM | POA: Diagnosis not present

## 2023-01-15 DIAGNOSIS — I10 Essential (primary) hypertension: Secondary | ICD-10-CM | POA: Diagnosis not present

## 2023-01-16 ENCOUNTER — Ambulatory Visit
Admission: RE | Admit: 2023-01-16 | Discharge: 2023-01-16 | Disposition: A | Discharge status: Home / Self Care | Payer: Medicare Other | Source: Ambulatory Visit | Attending: Family Medicine | Admitting: Family Medicine

## 2023-01-16 ENCOUNTER — Other Ambulatory Visit: Payer: Self-pay | Admitting: Family Medicine

## 2023-01-16 DIAGNOSIS — M1812 Unilateral primary osteoarthritis of first carpometacarpal joint, left hand: Secondary | ICD-10-CM | POA: Diagnosis not present

## 2023-01-16 DIAGNOSIS — M25532 Pain in left wrist: Secondary | ICD-10-CM

## 2023-01-28 DIAGNOSIS — E119 Type 2 diabetes mellitus without complications: Secondary | ICD-10-CM | POA: Diagnosis not present

## 2023-02-15 ENCOUNTER — Emergency Department (HOSPITAL_COMMUNITY): Payer: Medicare Other

## 2023-02-15 ENCOUNTER — Encounter (HOSPITAL_COMMUNITY): Payer: Self-pay

## 2023-02-15 ENCOUNTER — Other Ambulatory Visit: Payer: Self-pay

## 2023-02-15 ENCOUNTER — Observation Stay (HOSPITAL_COMMUNITY)
Admission: EM | Admit: 2023-02-15 | Discharge: 2023-02-16 | Disposition: A | Discharge status: Home / Self Care | Payer: Medicare Other | Attending: Obstetrics and Gynecology | Admitting: Obstetrics and Gynecology

## 2023-02-15 DIAGNOSIS — Z23 Encounter for immunization: Secondary | ICD-10-CM | POA: Diagnosis not present

## 2023-02-15 DIAGNOSIS — I5030 Unspecified diastolic (congestive) heart failure: Secondary | ICD-10-CM

## 2023-02-15 DIAGNOSIS — I4891 Unspecified atrial fibrillation: Secondary | ICD-10-CM | POA: Diagnosis not present

## 2023-02-15 DIAGNOSIS — I480 Paroxysmal atrial fibrillation: Secondary | ICD-10-CM | POA: Diagnosis present

## 2023-02-15 DIAGNOSIS — Z96659 Presence of unspecified artificial knee joint: Secondary | ICD-10-CM | POA: Insufficient documentation

## 2023-02-15 DIAGNOSIS — Z872 Personal history of diseases of the skin and subcutaneous tissue: Secondary | ICD-10-CM

## 2023-02-15 DIAGNOSIS — E119 Type 2 diabetes mellitus without complications: Secondary | ICD-10-CM | POA: Diagnosis not present

## 2023-02-15 DIAGNOSIS — Z7984 Long term (current) use of oral hypoglycemic drugs: Secondary | ICD-10-CM | POA: Diagnosis not present

## 2023-02-15 DIAGNOSIS — E669 Obesity, unspecified: Secondary | ICD-10-CM | POA: Diagnosis not present

## 2023-02-15 DIAGNOSIS — Z8542 Personal history of malignant neoplasm of other parts of uterus: Secondary | ICD-10-CM | POA: Diagnosis not present

## 2023-02-15 DIAGNOSIS — R011 Cardiac murmur, unspecified: Secondary | ICD-10-CM

## 2023-02-15 DIAGNOSIS — Z7982 Long term (current) use of aspirin: Secondary | ICD-10-CM | POA: Diagnosis not present

## 2023-02-15 DIAGNOSIS — I10 Essential (primary) hypertension: Secondary | ICD-10-CM | POA: Diagnosis not present

## 2023-02-15 DIAGNOSIS — G4733 Obstructive sleep apnea (adult) (pediatric): Secondary | ICD-10-CM

## 2023-02-15 DIAGNOSIS — R0902 Hypoxemia: Secondary | ICD-10-CM | POA: Diagnosis not present

## 2023-02-15 DIAGNOSIS — C549 Malignant neoplasm of corpus uteri, unspecified: Secondary | ICD-10-CM

## 2023-02-15 DIAGNOSIS — Z79899 Other long term (current) drug therapy: Secondary | ICD-10-CM | POA: Diagnosis not present

## 2023-02-15 DIAGNOSIS — R42 Dizziness and giddiness: Secondary | ICD-10-CM | POA: Diagnosis not present

## 2023-02-15 DIAGNOSIS — R7989 Other specified abnormal findings of blood chemistry: Secondary | ICD-10-CM | POA: Insufficient documentation

## 2023-02-15 DIAGNOSIS — N3281 Overactive bladder: Secondary | ICD-10-CM

## 2023-02-15 DIAGNOSIS — E7800 Pure hypercholesterolemia, unspecified: Secondary | ICD-10-CM

## 2023-02-15 DIAGNOSIS — K8010 Calculus of gallbladder with chronic cholecystitis without obstruction: Secondary | ICD-10-CM

## 2023-02-15 DIAGNOSIS — R000 Tachycardia, unspecified: Secondary | ICD-10-CM | POA: Diagnosis not present

## 2023-02-15 DIAGNOSIS — I83029 Varicose veins of left lower extremity with ulcer of unspecified site: Secondary | ICD-10-CM

## 2023-02-15 DIAGNOSIS — L97929 Non-pressure chronic ulcer of unspecified part of left lower leg with unspecified severity: Secondary | ICD-10-CM

## 2023-02-15 DIAGNOSIS — L03116 Cellulitis of left lower limb: Secondary | ICD-10-CM

## 2023-02-15 DIAGNOSIS — I499 Cardiac arrhythmia, unspecified: Secondary | ICD-10-CM | POA: Diagnosis not present

## 2023-02-15 LAB — I-STAT CHEM 8, ED
BUN: 12 mg/dL (ref 8–23)
Calcium, Ion: 1.09 mmol/L — ABNORMAL LOW (ref 1.15–1.40)
Chloride: 106 mmol/L (ref 98–111)
Creatinine, Ser: 0.5 mg/dL (ref 0.44–1.00)
Glucose, Bld: 125 mg/dL — ABNORMAL HIGH (ref 70–99)
HCT: 43 % (ref 36.0–46.0)
Hemoglobin: 14.6 g/dL (ref 12.0–15.0)
Potassium: 3.1 mmol/L — ABNORMAL LOW (ref 3.5–5.1)
Sodium: 145 mmol/L (ref 135–145)
TCO2: 25 mmol/L (ref 22–32)

## 2023-02-15 LAB — MAGNESIUM: Magnesium: 2 mg/dL (ref 1.7–2.4)

## 2023-02-15 LAB — CBC
HCT: 43 % (ref 36.0–46.0)
Hemoglobin: 13.9 g/dL (ref 12.0–15.0)
MCH: 28.4 pg (ref 26.0–34.0)
MCHC: 32.3 g/dL (ref 30.0–36.0)
MCV: 87.8 fL (ref 80.0–100.0)
Platelets: 305 10*3/uL (ref 150–400)
RBC: 4.9 MIL/uL (ref 3.87–5.11)
RDW: 14 % (ref 11.5–15.5)
WBC: 6.3 10*3/uL (ref 4.0–10.5)
nRBC: 0 % (ref 0.0–0.2)

## 2023-02-15 LAB — BASIC METABOLIC PANEL
Anion gap: 13 (ref 5–15)
BUN: 12 mg/dL (ref 8–23)
CO2: 25 mmol/L (ref 22–32)
Calcium: 8.8 mg/dL — ABNORMAL LOW (ref 8.9–10.3)
Chloride: 103 mmol/L (ref 98–111)
Creatinine, Ser: 0.75 mg/dL (ref 0.44–1.00)
GFR, Estimated: >60 mL/min (ref >60)
Glucose, Bld: 125 mg/dL — ABNORMAL HIGH (ref 70–99)
Potassium: 3.6 mmol/L (ref 3.5–5.1)
Sodium: 141 mmol/L (ref 135–145)

## 2023-02-15 LAB — HEMOGLOBIN A1C
Hgb A1c MFr Bld: 6 % — ABNORMAL HIGH (ref 4.8–5.6)
Mean Plasma Glucose: 125.5 mg/dL

## 2023-02-15 LAB — TROPONIN I (HIGH SENSITIVITY)
Troponin I (High Sensitivity): 21 ng/L — ABNORMAL HIGH (ref <18)
Troponin I (High Sensitivity): 19 ng/L — ABNORMAL HIGH (ref <18)

## 2023-02-15 LAB — TSH: TSH: 3.32 u[IU]/mL (ref 0.350–4.500)

## 2023-02-15 MED ORDER — FUROSEMIDE 40 MG PO TABS
40.0000 mg | ORAL_TABLET | Freq: Every day | ORAL | Status: DC
  Administered 2023-02-15 – 2023-02-16 (×2): 40 mg via ORAL
  Filled 2023-02-15: qty 2
  Filled 2023-02-15: qty 1

## 2023-02-15 MED ORDER — CALCIUM GLUCONATE-NACL 1-0.675 GM/50ML-% IV SOLN
1.0000 g | Freq: Once | INTRAVENOUS | Status: AC
  Administered 2023-02-15: 1000 mg via INTRAVENOUS
  Filled 2023-02-15: qty 50

## 2023-02-15 MED ORDER — ACETAMINOPHEN 325 MG PO TABS: 650.0000 mg | ORAL_TABLET | Freq: Four times a day (QID) | ORAL | Status: DC | PRN

## 2023-02-15 MED ORDER — TIMOLOL MALEATE 0.5 % OP SOLN
1.0000 [drp] | Freq: Two times a day (BID) | OPHTHALMIC | Status: DC
  Administered 2023-02-16: 1 [drp] via OPHTHALMIC
  Filled 2023-02-15: qty 5

## 2023-02-15 MED ORDER — LATANOPROST 0.005 % OP SOLN
1.0000 [drp] | Freq: Every day | OPHTHALMIC | Status: DC
  Administered 2023-02-15: 1 [drp] via OPHTHALMIC
  Filled 2023-02-15: qty 2.5

## 2023-02-15 MED ORDER — APIXABAN 5 MG PO TABS
5.0000 mg | ORAL_TABLET | Freq: Two times a day (BID) | ORAL | Status: DC
  Administered 2023-02-15 – 2023-02-16 (×2): 5 mg via ORAL
  Filled 2023-02-15 (×2): qty 1

## 2023-02-15 MED ORDER — IRBESARTAN 300 MG PO TABS
300.0000 mg | ORAL_TABLET | Freq: Every day | ORAL | Status: DC
  Administered 2023-02-15 – 2023-02-16 (×2): 300 mg via ORAL
  Filled 2023-02-15 (×2): qty 1

## 2023-02-15 MED ORDER — SODIUM CHLORIDE 0.9% FLUSH
3.0000 mL | Freq: Two times a day (BID) | INTRAVENOUS | Status: DC
  Administered 2023-02-15 – 2023-02-16 (×3): 3 mL via INTRAVENOUS

## 2023-02-15 MED ORDER — ENOXAPARIN SODIUM 100 MG/ML IJ SOSY
90.0000 mg | PREFILLED_SYRINGE | Freq: Two times a day (BID) | INTRAMUSCULAR | Status: DC
  Administered 2023-02-15: 90 mg via SUBCUTANEOUS
  Filled 2023-02-15 (×2): qty 0.9

## 2023-02-15 MED ORDER — SPIRONOLACTONE 25 MG PO TABS
25.0000 mg | ORAL_TABLET | Freq: Every day | ORAL | Status: DC
  Administered 2023-02-15 – 2023-02-16 (×2): 25 mg via ORAL
  Filled 2023-02-15 (×2): qty 1

## 2023-02-15 MED ORDER — PNEUMOCOCCAL 20-VAL CONJ VACC 0.5 ML IM SUSY
0.5000 mL | PREFILLED_SYRINGE | INTRAMUSCULAR | Status: AC
  Administered 2023-02-16: 0.5 mL via INTRAMUSCULAR
  Filled 2023-02-15: qty 0.5

## 2023-02-15 MED ORDER — ACETAMINOPHEN 650 MG RE SUPP: 650.0000 mg | Freq: Four times a day (QID) | RECTAL | Status: DC | PRN

## 2023-02-15 MED ORDER — ATORVASTATIN CALCIUM 10 MG PO TABS
20.0000 mg | ORAL_TABLET | Freq: Every day | ORAL | Status: DC
  Administered 2023-02-15 – 2023-02-16 (×2): 20 mg via ORAL
  Filled 2023-02-15 (×2): qty 2

## 2023-02-15 MED ORDER — BRIMONIDINE TARTRATE-TIMOLOL 0.2-0.5 % OP SOLN: 1.0000 [drp] | Freq: Two times a day (BID) | OPHTHALMIC | Status: DC

## 2023-02-15 MED ORDER — BRIMONIDINE TARTRATE 0.2 % OP SOLN
1.0000 [drp] | Freq: Two times a day (BID) | OPHTHALMIC | Status: DC
  Administered 2023-02-16: 1 [drp] via OPHTHALMIC
  Filled 2023-02-15: qty 5

## 2023-02-15 MED ORDER — METOPROLOL TARTRATE 25 MG PO TABS
25.0000 mg | ORAL_TABLET | Freq: Two times a day (BID) | ORAL | Status: DC
  Administered 2023-02-15 – 2023-02-16 (×2): 25 mg via ORAL
  Filled 2023-02-15 (×2): qty 1

## 2023-02-15 MED ORDER — ALBUTEROL SULFATE (2.5 MG/3ML) 0.083% IN NEBU: 2.5000 mg | INHALATION_SOLUTION | Freq: Four times a day (QID) | RESPIRATORY_TRACT | Status: DC | PRN

## 2023-02-15 MED ORDER — POTASSIUM CHLORIDE CRYS ER 20 MEQ PO TBCR
40.0000 meq | EXTENDED_RELEASE_TABLET | ORAL | Status: AC
  Administered 2023-02-15: 40 meq via ORAL
  Filled 2023-02-15: qty 2

## 2023-02-15 MED ORDER — DILTIAZEM HCL-DEXTROSE 125-5 MG/125ML-% IV SOLN (PREMIX)
5.0000 mg/h | INTRAVENOUS | Status: DC
  Administered 2023-02-15: 5 mg/h via INTRAVENOUS
  Filled 2023-02-15: qty 125

## 2023-02-15 NOTE — Progress Notes (Signed)
ANTICOAGULATION CONSULT NOTE - Initial Consult  Pharmacy Consult for enoxaparin Indication: atrial fibrillation  Allergies  Allergen Reactions   Shrimp [Shellfish Allergy] Anaphylaxis   Ancef [Cefazolin] Nausea And Vomiting   Benazepril Cough    Patient Measurements: Height: 5' 1"$  (154.9 cm) Weight: 92.5 kg (204 lb) IBW/kg (Calculated) : 47.8 Heparin Dosing Weight: n/a  Vital Signs: Temp: 97.4 F (36.3 C) (02/18 0651) Temp Source: Oral (02/18 0651) BP: 128/66 (02/18 0830) Pulse Rate: 92 (02/18 0830)  Labs: Recent Labs    02/15/23 0653 02/15/23 0659  HGB 13.9 14.6  HCT 43.0 43.0  PLT 305  --   CREATININE 0.75 0.50  TROPONINIHS 19*  --     Estimated Creatinine Clearance: 49.4 mL/min (by C-G formula based on SCr of 0.5 mg/dL).   Medical History: Past Medical History:  Diagnosis Date   Arthritis    Cancer ([place])    endometrial cancer   Cellulitis of left lower extremity    Diabetes mellitus    Hyperlipemia    Hypertension    Non-healing ulcer of lower leg (HCC)    Left leg     OAB (overactive bladder)    Obesity    BMI 45   Assessment: 89 yo F with new onset afib. Pharmacy consulted for therapeutic enoxaparin. No anticoagulation PTA per chart review/fill history.   Weight 92.5 kg  CrCl 16m/min , CBC WNL    Goal of Therapy:  Monitor platelets by anticoagulation protocol: Yes   Plan:  Enoxaparin 133m/kg q12h (9069m 12h)  F/u renal func, long term anticoag plans    MadWilson SingerharmD Clinical Pharmacist 02/15/2023 9:26 AM

## 2023-02-15 NOTE — Consult Note (Addendum)
Cardiology Consultation   Patient ID: [name] MRN: [id number]; DOB: 1931/07/28  Admit date: 02/15/2023 Date of Consult: 02/15/2023  PCP:  Lujean Amel, [place] Prov[facility] Cardiologist:  None        Patient Profile:   Jenna [name] is a 89 y.o. female with a hx of hypertension, hyperlipidemia, obesity, DM type II, endometrial cancer, OAB, who is being seen 02/15/2023 for the evaluation of new onset afib at the request of Dr. Tamala Julian.  History of Present Illness:   Jenna [name] has experienced right shoulder pain with first occurrence last Tuesday. Symptoms were somewhat intermittent but recurred with slightly increased severity on Thursday. Patient took ASA and was without shoulder pain through yesterday (Saturday night). Late even yesterday patient had recurrence of right shoulder pain radiating into her right chest with pain more severe than in previous episodes. Patient notified her daughter, who called EMS. EMS noted the patient to be in afib with rapid ventricular response, HR up into the 180s. They administered a Cardizem bolus as well as IV fluids. Despite rapid rate, patient says that she was without any sensation of palpitations or rapid heart beat.   Initially in the ED, patient had improved HR with ventricular rates in the 120s. She was placed on a diltiazem infusion and given Lovenox. Patient appears to have converted out of afib around 08:30am and diltiazem was discontinued.   Regarding recent HPI, patient says that she has generally felt well. Denies shortness of breath with exertion or when supine, chest pain, palpitations, exposure to illness. No recent medication changes. Patient does admit to recent stress due to a new cancer diagnosis for her husband.   Regarding afib risk factors, patient does not smoke or drink. She has previously been evaluated for sleep apnea and does not wear a CPAP. Per her, sleep studies were negative.  Past Medical  History:  Diagnosis Date   Arthritis    Cancer [facility])    endometrial cancer   Cellulitis of left lower extremity    Diabetes mellitus    Hyperlipemia    Hypertension    Non-healing ulcer of lower leg (HCC)    Left leg     OAB (overactive bladder)    Obesity    BMI 45    Past Surgical History:  Procedure Laterality Date   ABDOMINAL HYSTERECTOMY  02/2008   robotic hyst BSO Bilateral LND   EYE SURGERY     cataract   IR GENERIC HISTORICAL  09/30/2016   IR EMBO VENOUS NOT HEMORR HEMANG  INC GUIDE ROADMAPPING [facility] ULTRASOUND   JOINT REPLACEMENT     knee   KNEE SURGERY     left knee   LAPAROSCOPIC CHOLECYSTECTOMY SINGLE PORT N/A 07/04/2014   Procedure: LAPAROSCOPIC CHOLECYSTECTOMY SINGLE PORT;  Surgeon: Adin Hector, MD;  Location: [facility];  Service: General;  Laterality: N/A;   ROTATOR CUFF REPAIR     right arm   TUBAL LIGATION       Home Medications:  Prior to Admission medications   Medication Sig Start Date End Date Taking? Authorizing Provider  carvedilol (COREG) 3.125 MG tablet Take 3.125 mg by mouth 2 (two) times daily. 01/16/23  Yes [provider]  spironolactone (ALDACTONE) 25 MG tablet Take 25 mg by mouth daily. 01/17/23  Yes [provider]  amLODipine (NORVASC) 5 MG tablet Take 5 mg by mouth daily. 12/18/16   [provider]  aspirin 325 MG tablet Take 325  mg by mouth daily.    [provider]  atorvastatin (LIPITOR) 20 MG tablet Take 20 mg by mouth daily.      [provider]  brimonidine-timolol (COMBIGAN) 0.2-0.5 % ophthalmic solution Place 1 drop into both eyes every 12 (twelve) hours.    [provider]  doxycycline (VIBRAMYCIN) 100 MG capsule Take 100 mg by mouth 2 (two) times daily. 05/17/18   [provider]  furosemide (LASIX) 40 MG tablet Take 40 mg by mouth daily. 02/19/18   [provider]  latanoprost (XALATAN) 0.005 % ophthalmic solution Place 1 drop into both eyes at bedtime.      [provider]  metFORMIN (GLUCOPHAGE) 1000 MG tablet Take 1,000 mg by mouth 2 (two) times daily with a meal. Patient not taking: Reported on 09/05/2021    [provider]  naproxen (NAPROSYN) 375 MG tablet Take 1 tablet (375 mg total) by mouth 2 (two) times daily. Patient not taking: Reported on 05/18/2018 06/29/16   Blanchie Dessert, MD  silver sulfADIAZINE (SILVADENE) 1 % cream APPLY TO AFFECTED AREA TWICE A DAY 05/03/18   [provider]  telmisartan (MICARDIS) 80 MG tablet Take 80 mg by mouth daily. 04/19/18   [provider]  tolterodine (DETROL LA) 4 MG 24 hr capsule Take 4 mg by mouth daily. 05/12/16   [provider]    Inpatient Medications: Scheduled Meds:  atorvastatin  20 mg Oral Daily   enoxaparin (LOVENOX) injection  90 mg Subcutaneous Q12H   furosemide  40 mg Oral Daily   irbesartan  300 mg Oral Daily   sodium chloride flush  3 mL Intravenous Q12H   spironolactone  25 mg Oral Daily   Continuous Infusions:  diltiazem (CARDIZEM) infusion Stopped (02/15/23 1056)   PRN Meds: acetaminophen **OR** acetaminophen, albuterol  Allergies:    Allergies  Allergen Reactions   Shrimp [Shellfish Allergy] Anaphylaxis   Ancef [Cefazolin] Nausea And Vomiting   Benazepril Cough    Social History:   Social History   Socioeconomic History   Marital status: Married    Spouse name: Not on file   Number of children: Not on file   Years of education: Not on file   Highest education level: Not on file  Occupational History   Not on file  Tobacco Use   Smoking status: Never   Smokeless tobacco: Never  Substance and Sexual Activity   Alcohol use: No   Drug use: No   Sexual activity: Never    Birth control/protection: Surgical  Other Topics Concern   Not on file  Social History Narrative   Not on file   Social Determinants of Health   Financial Resource Strain: Not on file  Food Insecurity: Not on file  Transportation Needs: Not on  file  Physical Activity: Not on file  Stress: Not on file  Social Connections: Not on file  Intimate Partner Violence: Not on file    Family History:    Family History  Problem Relation Age of Onset   Cancer Brother 15       rectal   Early death Brother      ROS:  Please see the history of present illness.   All other ROS reviewed and negative.     Physical Exam/Data:   Vitals:   02/15/23 1052 02/15/23 1055 02/15/23 1056 02/15/23 1131  BP:      Pulse: (!) 59 (!) 58 (!) 58   Resp: 13 17 14   $ Temp:  98.7 F (37.1 C)  TempSrc:    Axillary  SpO2: 97% 97% 98%   Weight:      Height:       No intake or output data in the 24 hours ending 02/15/23 1357    02/15/2023    6:47 AM 09/24/2019    7:30 PM 05/18/2018    9:14 AM  Last 3 Weights  Weight (lbs) 204 lb 220 lb 230 lb  Weight (kg) 92.534 kg 99.791 kg 104.327 kg     Body mass index is 38.55 kg/m.  General:  Well nourished, well developed, in no acute distress HEENT: normal Neck: no JVD Vascular: No carotid bruits; Distal pulses 2+ bilaterally Cardiac:  normal S1, S2; RRR; no murmur  Lungs:  clear to auscultation bilaterally, no wheezing, rhonchi or rales  Abd: soft, nontender, no hepatomegaly  Ext: trace lower extremity edema around ankles. Musculoskeletal:  No deformities, BUE and BLE strength normal and equal Skin: warm and dry  Neuro:  CNs 2-12 intact, no focal abnormalities noted Psych:  Normal affect   EKG:  The EKG was personally reviewed and demonstrates:  initial rhythm shows atrial flutter with RVR. Repeat tracing after  Telemetry:  Telemetry was personally reviewed: initial telemetry shows atrial fibrillation. Patient appears to have converted to sinus rhythm sometime between 9:05am and 9:30am. Unfortunately this was while she was moving rooms and was not on telemetry. Now sinus bradycardia with intermittent PACs.  Relevant CV Studies:  01/23/2017 TTE  Study Conclusions   - Left ventricle: The  cavity size was normal. Wall thickness was    normal. Systolic function was normal. The estimated ejection    fraction was in the range of 60% to 65%. Wall motion was normal;    there were no regional wall motion abnormalities. Features are    consistent with a pseudonormal left ventricular filling pattern,    with concomitant abnormal relaxation and increased filling    pressure (grade 2 diastolic dysfunction).  - Aortic valve: Mildly calcified annulus. Normal thickness    leaflets. There was mild regurgitation.  - Pulmonary arteries: Systolic pressure was moderately increased.    PA peak pressure: 48 mm Hg (S).   -------------------------------------------------------------------  Study data:  No prior study was available for comparison.  Study  status:  Routine.  Procedure:  The patient reported no pain pre or  post test. Transthoracic echocardiography. Image quality was  adequate.  Study completion:  There were no complications.  Transthoracic echocardiography.  M-mode, complete 2D, spectral  Doppler, and color Doppler.  Birthdate:  Patient birthdate:  [birth year].  Age:  Patient is 89 yr old.  Sex:  Gender: female.  BMI: 45.2 kg/m^2.  Blood pressure:     177/75  Patient status:  Outpatient.  Study date:  Study date: 01/23/2017. Study time: 11:39  AM.  Location:  [place] [facility]   -------------------------------------------------------------------   -------------------------------------------------------------------  Left ventricle:  The cavity size was normal. Wall thickness was  normal. Systolic function was normal. The estimated ejection  fraction was in the range of 60% to 65%. Wall motion was normal;  there were no regional wall motion abnormalities. Features are  consistent with a pseudonormal left ventricular filling pattern,  with concomitant abnormal relaxation and increased filling pressure  (grade 2 diastolic dysfunction).    -------------------------------------------------------------------  Aortic valve:   Mildly calcified annulus. Normal thickness  leaflets.  Doppler:   There was no stenosis.   There was mild  regurgitation.   -------------------------------------------------------------------  Aorta: Aortic root: The aortic root was normal in size.  Ascending aorta: The ascending aorta was normal in size.   -------------------------------------------------------------------  Mitral valve:   Structurally normal valve.   Leaflet separation was  normal.  Doppler:  Transvalvular velocity was within the normal  range. There was no evidence for stenosis. There was trivial  regurgitation.    Peak gradient (D): 12 mm Hg.   -------------------------------------------------------------------  Left atrium:  The atrium was normal in size.   -------------------------------------------------------------------  Right ventricle:  The cavity size was normal. Systolic function was  normal.   -------------------------------------------------------------------  Pulmonic valve:    The valve appears to be grossly normal.  Doppler:  There was no significant regurgitation.   -------------------------------------------------------------------  Tricuspid valve:   The valve appears to be grossly normal.  Doppler:  There was mild regurgitation.   -------------------------------------------------------------------  Pulmonary artery:   Systolic pressure was moderately increased.   -------------------------------------------------------------------  Right atrium:  The atrium was normal in size.   -------------------------------------------------------------------  Pericardium: There was no pericardial effusion.   -------------------------------------------------------------------  Systemic veins:  Inferior vena cava: The vessel was normal in size. The  respirophasic diameter changes were in the normal range (>= 50%),   consistent with normal central venous pressure.    Laboratory Data:  High Sensitivity Troponin:   Recent Labs  Lab 02/15/23 0128 02/15/23 0653  TROPONINIHS 21* 19*     Chemistry Recent Labs  Lab 02/15/23 0653 02/15/23 0659  NA 141 145  K 3.6 3.1*  CL 103 106  CO2 25  --   GLUCOSE 125* 125*  BUN 12 12  CREATININE 0.75 0.50  CALCIUM 8.8*  --   MG 2.0  --   GFRNONAA >60  --   ANIONGAP 13  --     No results for input(s): "PROT", "ALBUMIN", "AST", "ALT", "ALKPHOS", "BILITOT" in the last 168 hours. Lipids No results for input(s): "CHOL", "TRIG", "HDL", "LABVLDL", "LDLCALC", "CHOLHDL" in the last 168 hours.  Hematology Recent Labs  Lab 02/15/23 0653 02/15/23 0659  WBC 6.3  --   RBC 4.90  --   HGB 13.9 14.6  HCT 43.0 43.0  MCV 87.8  --   MCH 28.4  --   MCHC 32.3  --   RDW 14.0  --   PLT 305  --    Thyroid  Recent Labs  Lab 02/15/23 0128  TSH 3.320    BNPNo results for input(s): "BNP", "PROBNP" in the last 168 hours.  DDimer No results for input(s): "DDIMER" in the last 168 hours.   Radiology/Studies:  DG Chest Port 1 View  Result Date: 02/15/2023 CLINICAL DATA:  Atrial fibrillation EXAM: PORTABLE CHEST 1 VIEW COMPARISON:  09/24/2019 FINDINGS: Normal heart size and stable mediastinal contours. No acute infiltrate or edema. No effusion or pneumothorax. No acute osseous findings. Artifact from EKG leads. IMPRESSION: No active disease. Electronically Signed   By: Jorje Guild M.D.   On: 02/15/2023 07:17     Assessment and Plan:   New onset atrial fibrillation with RVR CHA2DS2-VASc Score = 5   Patient admitted following new onset of afib with RVR. Now in NSR following cardizem infusion.   Start Metoprolol 79m for rate control. Preferred over cardizem given multiple anti-hypertensive medications. Transition to Eliquis 574mBID [place] ordered Will arrange afib clinic follow up.   HFpEF  2018 TTE with grade II diastolic dysfunction reported.  Patient not regularly followed by cardiology but is on Lasix (reportedly not  taking recently), spironolactone, irbesartan.  No significant volume overload appreciated on physical exam Would consider holding lasix. Continue spironolactone and irbesartan Follow TTE ordered.  Hypertension  Patient on Irbesartan and spironolactone. BP slightly elevated today. Will rate control as above with Metoprolol to allow for ongoing use of Irbesartan and Spironolactone.   Risk Assessment/Risk Scores:        CHA2DS2-VASc Score = 5   This indicates a 7.2% annual risk of stroke. The patient's score is based upon: CHF History: 0 HTN History: 1 Diabetes History: 1 Stroke History: 0 Vascular Disease History: 0 Age Score: 2 Gender Score: 1         For questions or updates, please contact [place] Please consult [email or web address] for contact info under    Signed, Lily Kocher, PA-C  02/15/2023 1:57 PM   ATTENDING NOTE  I saw and examined the patient independetly. Patient is admitted with atrial fibrillation. Her presenting symptom was right shoulder pain that correlated with RVR. She had similar symptoms most of the day on Friday but has never noticed this prior, nor has she noticed palpitations.  PE: Appears comfortable, breathing easily. RRR, no murmurs. Trace dependent edema.   I agree with the assessment and plan above. Resume home medications for HTN. Start metoprolol. TTE. Follow-up in afib clinic this week. Would place monitor as outpatient to assess rate control and burden.  We will sign off. Please call with questions or concerns.

## 2023-02-15 NOTE — ED Triage Notes (Addendum)
Pt from home BIB [facility], pt called out for left shoulder since last night and radiates into her chest. Pt c/o SOB. EMS reports HR on scene 120-180s, no Hx of Afib, not on thinners. Pt denies CP, has felt lightheaded. EMS administer 39m Cardizem and 3099mNS bolus. Pt A&Ox4 on arrival. HTN noted, HR 129

## 2023-02-15 NOTE — ED Notes (Signed)
Patient left the floor in stable condition with staff and her belongings.

## 2023-02-15 NOTE — H&P (Signed)
History and Physical    Patient: Jenna [name] [id number] DOB: 04-08-1931 DOA: 02/15/2023 DOS: the patient was seen and examined on 02/15/2023 PCP: Lujean Amel, MD  Patient coming from: Home  Chief Complaint:  Chief Complaint  Patient presents with   Afib RVR (New Onset)   HPI: Jenna [name] is a 89 y.o. female with medical history significant of hypertension, hyperlipidemia, diastolic dysfunction, diabetes mellitus type 2, cellulitis, endometrial cancer who presented initially with complaints of right shoulder discomfort over the last 2 days.  She describes it as a discomfort that radiated down her right arm and she had taken 3 aspirin with improvement in symptoms the other evening.  However her symptoms returned yesterday going down her arm.  She tried to take aspirin again, but noted no improvement in symptoms at this time for which she became more concerned.  Denied having any fevers, chest pain, palpitations, nausea, vomiting, or diarrhea.  She has been dealing with cellulitis of the left lower extremity for which she has swelling, but states has been wearing compression stockings with improvement in symptoms.  Plan is for her possibly to have a vein removed from the leg to decrease the risk of recurrence of the cellulitis.  En route with EMS patient was noted to be in atrial fibrillation with heart rates elevated up to the 180s.  Given diltiazem 10 mg IV and 300 mL normal saline IV fluids.  In the emergency department patient was noted to be afebrile with heart rates elevated into the 120s, blood pressures elevated to 164/95, and all other vital signs maintained.  Labs noted potassium 3.6, calcium 8.1, ionized calcium 1.09, magnesium 2, and high-sensitivity troponin 21->19.  Review of Systems: As mentioned in the history of present illness. All other systems reviewed and are negative. Past Medical History:  Diagnosis Date   Arthritis    Cancer [facility])    endometrial cancer    Cellulitis of left lower extremity    Diabetes mellitus    Hyperlipemia    Hypertension    Non-healing ulcer of lower leg (HCC)    Left leg     OAB (overactive bladder)    Obesity    BMI 45   Past Surgical History:  Procedure Laterality Date   ABDOMINAL HYSTERECTOMY  02/2008   robotic hyst BSO Bilateral LND   EYE SURGERY     cataract   IR GENERIC HISTORICAL  09/30/2016   IR EMBO VENOUS NOT HEMORR HEMANG  INC GUIDE ROADMAPPING [facility] ULTRASOUND   JOINT REPLACEMENT     knee   KNEE SURGERY     left knee   LAPAROSCOPIC CHOLECYSTECTOMY SINGLE PORT N/A 07/04/2014   Procedure: LAPAROSCOPIC CHOLECYSTECTOMY SINGLE PORT;  Surgeon: Adin Hector, MD;  Location: [facility];  Service: General;  Laterality: N/A;   ROTATOR CUFF REPAIR     right arm   TUBAL LIGATION     Social History:  reports that she has never smoked. She has never used smokeless tobacco. She reports that she does not drink alcohol and does not use drugs.  Allergies  Allergen Reactions   Shrimp [Shellfish Allergy] Anaphylaxis   Ancef [Cefazolin] Nausea And Vomiting    Family History  Problem Relation Age of Onset   Cancer Brother 33       rectal   Early death Brother     Prior to Admission medications   Medication Sig Start Date End Date Taking? Authorizing Provider  amLODipine (NORVASC) 5 MG tablet Take  5 mg by mouth daily. 12/18/16   [provider]  aspirin 325 MG tablet Take 325 mg by mouth daily.    [provider]  atorvastatin (LIPITOR) 20 MG tablet Take 20 mg by mouth daily.      [provider]  brimonidine-timolol (COMBIGAN) 0.2-0.5 % ophthalmic solution Place 1 drop into both eyes every 12 (twelve) hours.    [provider]  doxycycline (VIBRAMYCIN) 100 MG capsule Take 100 mg by mouth 2 (two) times daily. 05/17/18   [provider]  furosemide (LASIX) 40 MG tablet Take 40 mg by mouth daily. 02/19/18   [provider]  latanoprost (XALATAN) 0.005 %  ophthalmic solution Place 1 drop into both eyes at bedtime.     [provider]  metFORMIN (GLUCOPHAGE) 1000 MG tablet Take 1,000 mg by mouth 2 (two) times daily with a meal. Patient not taking: Reported on 09/05/2021    [provider]  naproxen (NAPROSYN) 375 MG tablet Take 1 tablet (375 mg total) by mouth 2 (two) times daily. Patient not taking: Reported on 05/18/2018 06/29/16   Blanchie Dessert, MD  silver sulfADIAZINE (SILVADENE) 1 % cream APPLY TO AFFECTED AREA TWICE A DAY 05/03/18   [provider]  telmisartan (MICARDIS) 80 MG tablet Take 80 mg by mouth daily. 04/19/18   [provider]  tolterodine (DETROL LA) 4 MG 24 hr capsule Take 4 mg by mouth daily. 05/12/16   [provider]    Physical Exam: Vitals:   02/15/23 0745 02/15/23 0800 02/15/23 0815 02/15/23 0830  BP: (!) 163/137 (!) 142/55 (!) 142/72 128/66  Pulse: 87 (!) 105 (!) 102 92  Resp: 15 17  18  $ Temp:      TempSrc:      SpO2: 99% 99% 98% 98%  Weight:      Height:        Constitutional: Elderly obese female currently in no acute distress Eyes: PERRL, lids and conjunctivae normal ENMT: Mucous membranes are moist.  No JVD. Neck: normal, supple  Respiratory: clear to auscultation bilaterally, no wheezing, no crackles. Normal respiratory effort. No accessory muscle use.  Cardiovascular: Irregular irregular heart rhythm. Abdomen: no tenderness, no masses palpated. . Bowel sounds positive.  Musculoskeletal: no clubbing / cyanosis. No joint deformity upper and lower extremities. Good ROM, no contractures. Normal muscle tone.  Skin: Venous stasis changes noted of the left lower extremity. Neurologic: CN 2-12 grossly intact.  normal. Strength 5/5 in all 4.  Psychiatric: Normal judgment and insight. Alert and oriented x 3. Normal mood.   Data Reviewed:  EKG revealed atrial fibrillation at 121 bpm with LVH.  Assessment and Plan:  Atrial fibrillation with RVR Acute.  Patient was  found to be in atrial fibrillation with heart rates into the 180s and route with EMS for which she was given diltiazem 10 mg IV with some improvement in heart rates.  Patient was placed on Cardizem drip with improvement in heart rates. CHA2DS2-VASc score equal to at least 5 based off age, sex, HTN, and DM history. -Admit to a cardiac telemetry bed -Lovenox per pharmacy with plans to transition to oral anticoagulation -Goal potassium at least 4 and magnesium at least 2.  Will monitor and replace electrolytes as needed -Check TSH -Check echocardiogram -Ambulatory referral to atrial fibrillation clinic had been placed -Cardiology consulted  Elevated troponin   Acute.  High-sensitivity troponins 21-> 19.  Suspect secondary to demand in setting of rapid A-fib. -Continue to monitor  Hypocalcemia  Acute.  Calcium 8.8 with ionized calcium 1.09. -Give 1 g of calcium gluconate IV -Continue to monitor and replace as needed  Essential hypertension Blood pressures range from 120/68 -164/95.  Home medication regimen appears to include Coreg 3.125 mg twice daily, furosemide 40 mg daily, spironolactone 25 mg daily, and telmisartan 80 mg daily. -Held Coreg -Continue furosemide, spironolactone, and telmisartan  Controlled diabetes mellitus type 2, without long-term use of insulin On admission glucose 125.  Patient not on any medication for treatment.  Hemoglobin A1c  6.  Hyperlipidemia -Continue atorvastatin  History of cellulitis and venous stasis -Continue compression stocking  Obesity BMI 38.55 kg/m  DVT prophylaxis: Lovenox with plans to transition to Eliquis Advance Care Planning:   Code Status: Full Code    Consults: Cardiology  Family Communication: Patient daughter updated at bedside  Severity of Illness: The appropriate patient status for this patient is OBSERVATION. Observation status is judged to be reasonable and necessary in order to provide the required intensity of service to  ensure the patient's safety. The patient's presenting symptoms, physical exam findings, and initial radiographic and laboratory data in the context of their medical condition is felt to place them at decreased risk for further clinical deterioration. Furthermore, it is anticipated that the patient will be medically stable for discharge from the hospital within 2 midnights of admission.   Author: Norval Morton, MD 02/15/2023 8:46 AM  For on call review [email or web address].

## 2023-02-15 NOTE — ED Provider Notes (Signed)
This patient was accepted at change of shift, she has been having some right-sided shoulder pain and cramping for the last several days which appears intermittent.  She had not felt any palpitations and no shortness of breath.  She does have some mild persistent chronic swelling of the legs.  No history of atrial fibrillation and I reviewed the medical record going back about 4 years and find no evidence of cardiology evaluations.  The patient is followed by [facility] primary care.  On exam this patient is tachycardic with an irregularly irregular rhythm consistent with atrial fibrillation.  I have reviewed the EKG and compared to prior EKGs, this appears to be new onset.  The patient does not know when her symptoms started, her only symptom was right-sided shoulder pain and cramping which appears to be intermittent.  She has no known cardiac disease either lexical or obstructive.  She has not had a heart catheterization but has had an echocardiogram as recently as 2018, at that time 6 years ago she had a normal ejection fraction but had some grade 2 diastolic dysfunction.  Currently on Cardizem drip, will plan on admission to hospital with new onset A-fib Discussed the case with Dr. Tamala Julian who will admit   Noemi Chapel, MD 02/15/23 [phone]

## 2023-02-15 NOTE — ED Notes (Signed)
Jenna Julian, MD notified that pt's heart rate was below 60 bpm, and her Cardizem was d/c per parameters on the order.

## 2023-02-15 NOTE — ED Provider Notes (Signed)
[facility] Emergency Department Provider Note MRN:  [id number]  Arrival date & time: 02/15/23     Chief Complaint   Afib RVR (New Onset)   History of Present Illness   Jenna [name] is a 89 y.o. year-old female with a history of hypertension presenting to the ED with chief complaint of A-fib.  Patient has been having intermittent right shoulder cramping pain for the past 3 days.  She denies any palpitations.  No shortness of breath.  Chronic leg swelling that is unchanged.  Found to be in A-fib with RVR with rates in the 180s with EMS, provided with diltiazem and route.  Review of Systems  A thorough review of systems was obtained and all systems are negative except as noted in the HPI and PMH.   Patient's Health History    Past Medical History:  Diagnosis Date   Arthritis    Cancer ([place])    endometrial cancer   Cellulitis of left lower extremity    Diabetes mellitus    Hyperlipemia    Hypertension    Non-healing ulcer of lower leg (HCC)    Left leg     OAB (overactive bladder)    Obesity    BMI 45    Past Surgical History:  Procedure Laterality Date   ABDOMINAL HYSTERECTOMY  02/2008   robotic hyst BSO Bilateral LND   EYE SURGERY     cataract   IR GENERIC HISTORICAL  09/30/2016   IR EMBO VENOUS NOT HEMORR HEMANG  INC GUIDE ROADMAPPING [facility] ULTRASOUND   JOINT REPLACEMENT     knee   KNEE SURGERY     left knee   LAPAROSCOPIC CHOLECYSTECTOMY SINGLE PORT N/A 07/04/2014   Procedure: LAPAROSCOPIC CHOLECYSTECTOMY SINGLE PORT;  Surgeon: Adin Hector, MD;  Location: [facility];  Service: General;  Laterality: N/A;   ROTATOR CUFF REPAIR     right arm   TUBAL LIGATION      Family History  Problem Relation Age of Onset   Cancer Brother 36       rectal   Early death Brother     Social History   Socioeconomic History   Marital status: Married    Spouse name: Not on file   Number of children: Not on file   Years of education: Not on file    Highest education level: Not on file  Occupational History   Not on file  Tobacco Use   Smoking status: Never   Smokeless tobacco: Never  Substance and Sexual Activity   Alcohol use: No   Drug use: No   Sexual activity: Never    Birth control/protection: Surgical  Other Topics Concern   Not on file  Social History Narrative   Not on file   Social Determinants of Health   Financial Resource Strain: Not on file  Food Insecurity: Not on file  Transportation Needs: Not on file  Physical Activity: Not on file  Stress: Not on file  Social Connections: Not on file  Intimate Partner Violence: Not on file     Physical Exam   Vitals:   02/15/23 0644 02/15/23 0651  BP:  (!) 138/93  Pulse:  (!) 125  Resp:  (!) 21  Temp:  (!) 97.4 F (36.3 C)  SpO2: 100% 97%    CONSTITUTIONAL: Well-appearing, NAD NEURO/PSYCH:  Alert and oriented x 3, no focal deficits EYES:  eyes equal and reactive ENT/NECK:  no LAD, no JVD CARDIO: Tachycardic and irregular rate,  well-perfused, normal S1 and S2 PULM:  CTAB no wheezing or rhonchi GI/GU:  non-distended, non-tender MSK/SPINE:  No gross deformities, no edema SKIN:  no rash, atraumatic   *Additional and/or pertinent findings included in MDM below  Diagnostic and Interventional Summary    EKG Interpretation  Date/Time:  Sunday February 15 2023 06:49:07 EST Ventricular Rate:  121 PR Interval:    QRS Duration: 89 QT Interval:  324 QTC Calculation: 460 R Axis:   37 Text Interpretation: Atrial fibrillation LVH with secondary repolarization abnormality Confirmed by Gerlene Fee [phone]) on 02/15/2023 7:07:37 AM       Labs Reviewed  I-STAT CHEM 8, ED - Abnormal; Notable for the following components:      Result Value   Potassium 3.1 (*)    Glucose, Bld 125 (*)    Calcium, Ion 1.09 (*)    All other components within normal limits  BASIC METABOLIC PANEL  MAGNESIUM  CBC  TROPONIN I (HIGH SENSITIVITY)    DG Chest Port 1 View     (Results Pending)    Medications  diltiazem (CARDIZEM) 125 mg in dextrose 5% 125 mL (1 mg/mL) infusion (has no administration in time range)     Procedures  /  Critical Care .Critical Care  Performed by: Maudie Flakes, MD Authorized by: Maudie Flakes, MD   Critical care provider statement:    Critical care time (minutes):  35   Critical care was necessary to treat or prevent imminent or life-threatening deterioration of the following conditions: A-fib with RVR.   Critical care was time spent personally by me on the following activities:  Development of treatment plan with patient or surrogate, discussions with consultants, evaluation of patient's response to treatment, examination of patient, ordering and review of laboratory studies, ordering and review of radiographic studies, ordering and performing treatments and interventions, pulse oximetry, re-evaluation of patient's condition and review of old charts   ED Course and Medical Decision Making  Initial Impression and Ddx Patient presenting with A-fib, rapid ventricular response.  Does not really feel the palpitations or elevated heart rate.  Has been having new right shoulder cramping for the past 3 days or so.  Given this history, does not seem like a good cardioversion candidate at this time.  She is not anticoagulated.  Suspect a primary cardiac A-fib, I do not see any obvious secondary causes such as acute infection, doubt PE given the lack of shortness of breath, no new leg swelling or pain.  Despite diltiazem with EMS patient's heart rate still in the 130s, will start diltiazem drip.  Suspect will need admission.  Past medical/surgical history that increases complexity of ED encounter: Hypertension  Interpretation of Diagnostics I personally reviewed the EKG and my interpretation is as follows: A-fib with RVR  Labs pending  Patient Reassessment and Ultimate Disposition/Management     Signed out to oncoming  provider.  Patient management required discussion with the following services or consulting groups:  None  Complexity of Problems Addressed Acute illness or injury that poses threat of life of bodily function  Additional Data Reviewed and Analyzed Further history obtained from: EMS on arrival  Additional Factors Impacting ED Encounter Risk Consideration of hospitalization  [name] Kirks. Sedonia Small, MD [place] [email or web address]  Final Clinical Impressions(s) / ED Diagnoses     ICD-10-CM   1. Atrial fibrillation with rapid ventricular response ([place])  I48.91       ED Discharge Orders  None        Discharge Instructions Discussed with and Provided to Patient:   Discharge Instructions   None      Maudie Flakes, MD 02/15/23 [phone]

## 2023-02-15 NOTE — ED Notes (Signed)
ED TO INPATIENT HANDOFF REPORT  ED Nurse Name and Phone #: Leafy Ro [phone]  S Name/Age/Gender Jenna [name] 89 y.o. female Room/Bed: 041C/041C  Code Status   Code Status: Full Code  Home/SNF/Other Home Patient oriented to: self, place, time, and situation Is this baseline? No   Triage Complete: Triage complete  Chief Complaint New onset atrial fibrillation (HCC) [I48.91]  Triage Note Pt from home BIB [facility], pt called out for left shoulder since last night and radiates into her chest. Pt c/o SOB. EMS reports HR on scene 120-180s, no Hx of Afib, not on thinners. Pt denies CP, has felt lightheaded. EMS administer 20m Cardizem and 3051mNS bolus. Pt A&Ox4 on arrival. HTN noted, HR 129   Allergies Allergies  Allergen Reactions   Shrimp [Shellfish Allergy] Anaphylaxis   Ancef [Cefazolin] Nausea And Vomiting   Benazepril Cough    Level of Care/Admitting Diagnosis ED Disposition     ED Disposition  Admit   Condition  --   Comment  [facility]: [id number]  Level of Care: Telemetry Medical [104]  [facility] equivalent level of care is available:: No  Covid Evaluation: Asymptomatic - no recent exposure (last 10 days) testing not required  Diagnosis: New onset atrial fibrillation ([facility][[id number] Physician: SMNorval [id number] Physician: SMNorval [id number]        B Medical/Surgery History Past Medical History:  Diagnosis Date   Arthritis    [place]   endomet[place] cancer   Cellulitis of left lower extremity    Diabetes mellitus    Hyperlipemia    Hypertension    Non-healing ulcer of lower leg ([place]   Left leg     OAB (overactive bladder)    Obesity    BMI 45   Past Surgical History:  Procedure Laterality Date   ABDOMINAL HYSTERECTOMY  02/2008   robotic hyst BSO Bilateral LND   EYE SURGERY     cataract   IR GENERIC HISTORICAL   09/30/2016   IR EMBO VENOUS NOT HEMORR HEMANG  INC GUIDE ROADMAPPING [facility] ULTRASOUND   JOINT REPLACEMENT     knee   KNEE SURGERY     left knee   LAPAROSCOPIC CHOLECYSTECTOMY SINGLE PORT N/A 07/04/2014   Procedure: LAPAROSCOPIC CHOLECYSTECTOMY SINGLE PORT;  Surgeon: StAdin HectorMD;  Location: [facility];  Service: General;  Laterality: N/A;   ROTATOR CUFF REPAIR     right arm   TUBAL LIGATION       A IV Location/Drains/Wounds Patient Lines/Drains/Airways Status     Active Line/Drains/Airways     Name Placement date Placement time Site Days   Peripheral IV 02/15/23 20 G Left;Posterior Hand 02/15/23  0646  Hand  less than 1   Peripheral IV 02/15/23 20 G 1" Right Antecubital 02/15/23  1030  Antecubital  less than 1            Intake/Output Last 24 hours No intake or output data in the 24 hours ending 02/15/23 1525  Labs/Imaging Results for orders placed or performed during the hospital encounter of 02/15/23 (from the past 48 hour(s))  Troponin I (High Sensitivity)     Status: Abnormal   Collection Time: 02/15/23  1:28 AM  Result Value Ref Range   Troponin I (High Sensitivity) 21 (H) <18 ng/L    Comment: (NOTE) Elevated high sensitivity troponin I (hsTnI)  values and significant  changes across serial measurements may suggest ACS but many other  chronic and acute conditions are known to elevate hsTnI results.  Refer to the "Links" section for chest pain algorithms and additional  guidance. Performed at [facility], [place] [address], [place], [place] [address]   TSH     Status: None   Collection Time: 02/15/23  1:28 AM  Result Value Ref Range   TSH 3.320 0.350 - 4.500 uIU/mL    Comment: Performed by a 3rd Generation assay with a functional sensitivity of <=0.01 uIU/mL. Performed at [facility], [place] [address] [place], [place] [id number]   Basic metabolic panel     Status: Abnormal   Collection Time: 02/15/23  6:53 AM  Result Value Ref Range   Sodium 141  135 - 145 mmol/L   Potassium 3.6 3.5 - 5.1 mmol/L    Comment: HEMOLYSIS AT THIS LEVEL MAY AFFECT RESULT   Chloride 103 98 - 111 mmol/L   CO2 25 22 - 32 mmol/L   Glucose, Bld 125 (H) 70 - 99 mg/dL    Comment: Glucose reference range applies only to samples taken after fasting for at least 8 hours.   BUN 12 8 - 23 mg/dL   Creatinine, Ser 0.75 0.44 - 1.00 mg/dL   Calcium 8.8 (L) 8.9 - 10.3 mg/dL   GFR, Estimated >60 >60 mL/min    Comment: (NOTE) Calculated using the CKD-EPI Creatinine Equation (2021)    Anion gap 13 5 - 15    Comment: Performed at [facility] [address], [place], [place] [address]  Magnesium     Status: None   Collection Time: 02/15/23  6:53 AM  Result Value Ref Range   Magnesium 2.0 1.7 - 2.4 mg/dL    Comment: Performed at [facility] [address] [place], [place] [address]  CBC     Status: None   Collection Time: 02/15/23  6:53 AM  Result Value Ref Range   WBC 6.3 4.0 - 10.5 K/uL   RBC 4.90 3.87 - 5.11 MIL/uL   Hemoglobin 13.9 12.0 - 15.0 g/dL   HCT 43.0 36.0 - 46.0 %   MCV 87.8 80.0 - 100.0 fL   MCH 28.4 26.0 - 34.0 pg   MCHC 32.3 30.0 - 36.0 g/dL   RDW 14.0 11.5 - 15.5 %   Platelets 305 150 - 400 K/uL   nRBC 0.0 0.0 - 0.2 %    Comment: Performed at [facility], [place] [address], [place], [place] [address]  Troponin I (High Sensitivity)     Status: Abnormal   Collection Time: 02/15/23  6:53 AM  Result Value Ref Range   Troponin I (High Sensitivity) 19 (H) <18 ng/L    Comment: (NOTE) Elevated high sensitivity troponin I (hsTnI) values and significant  changes across serial measurements may suggest ACS but many other  chronic and acute conditions are known to elevate hsTnI results.  Refer to the "Links" section for chest pain algorithms and additional  guidance. Performed at [facility], [place] [address] [place], [place] [address]   I-stat chem 8, ED (not at [facility], [facility] or [facility])     Status: Abnormal   Collection Time:  02/15/23  6:59 AM  Result Value Ref Range   Sodium 145 135 - 145 mmol/L   Potassium 3.1 (L) 3.5 - 5.1 mmol/L   Chloride 106 98 - 111 mmol/L  BUN 12 8 - 23 mg/dL   Creatinine, Ser 0.50 0.44 - 1.00 mg/dL   Glucose, Bld 125 (H) 70 - 99 mg/dL    Comment: Glucose reference range applies only to samples taken after fasting for at least 8 hours.   Calcium, Ion 1.09 (L) 1.15 - 1.40 mmol/L   TCO2 25 22 - 32 mmol/L   Hemoglobin 14.6 12.0 - 15.0 g/dL   HCT 43.0 36.0 - 46.0 %  Hemoglobin A1c     Status: Abnormal   Collection Time: 02/15/23 10:28 AM  Result Value Ref Range   Hgb A1c MFr Bld 6.0 (H) 4.8 - 5.6 %    Comment: (NOTE) Pre diabetes:          5.7%-6.4%  Diabetes:              >6.4%  Glycemic control for   <7.0% adults with diabetes    Mean Plasma Glucose 125.5 mg/dL    Comment: Performed at [facility] [address], [place], [place] [address]   DG Chest Port 1 View  Result Date: 02/15/2023 CLINICAL DATA:  Atrial fibrillation EXAM: PORTABLE CHEST 1 VIEW COMPARISON:  09/24/2019 FINDINGS: Normal heart size and stable mediastinal contours. No acute infiltrate or edema. No effusion or pneumothorax. No acute osseous findings. Artifact from EKG leads. IMPRESSION: No active disease. Electronically Signed   By: Jorje Guild M.D.   On: 02/15/2023 07:17    Pending Labs Unresulted Labs (From admission, onward)     Start     Ordered   02/16/23 0500  CBC  Tomorrow morning,   R        02/15/23 0918   02/16/23 [id number]  Basic metabolic panel  Tomorrow morning,   R        02/15/23 0918   02/16/23 0500  Calcium, ionized  Tomorrow morning,   R        02/15/23 0918            Vitals/Pain Today's Vitals   02/15/23 1055 02/15/23 1056 02/15/23 1131 02/15/23 1430  BP:    (!) 124/97  Pulse: (!) 58 (!) 58  69  Resp: 17 14  20  $ Temp:   98.7 F (37.1 C)   TempSrc:   Axillary   SpO2: 97% 98%  97%  Weight:      Height:      PainSc:        Isolation Precautions No active  isolations  Medications Medications  sodium chloride flush (NS) 0.9 % injection 3 mL (3 mLs Intravenous Given 02/15/23 0959)  acetaminophen (TYLENOL) tablet 650 mg (has no administration in time range)    Or  acetaminophen (TYLENOL) suppository 650 mg (has no administration in time range)  albuterol (PROVENTIL) (2.5 MG/3ML) 0.083% nebulizer solution 2.5 mg (has no administration in time range)  atorvastatin (LIPITOR) tablet 20 mg (has no administration in time range)  irbesartan (AVAPRO) tablet 300 mg (has no administration in time range)  spironolactone (ALDACTONE) tablet 25 mg (has no administration in time range)  furosemide (LASIX) tablet 40 mg (has no administration in time range)  brimonidine-timolol (COMBIGAN) 0.2-0.5 % ophthalmic solution 1 drop (1 drop Both Eyes Not Given 02/15/23 1524)  latanoprost (XALATAN) 0.005 % ophthalmic solution 1 drop (has no administration in time range)  metoprolol tartrate (LOPRESSOR) tablet 25 mg (has no administration in time range)  apixaban (ELIQUIS) tablet 5 mg (has no administration in time range)  potassium chloride SA (KLOR-CON M) CR  tablet 40 mEq (40 mEq Oral Given 02/15/23 0959)  calcium gluconate 1 g/ 50 mL sodium chloride IVPB (0 mg Intravenous Stopped 02/15/23 1144)    Mobility walks with device     Focused Assessments Cardiac Assessment Handoff:  Cardiac Rhythm: Atrial fibrillation No results found for: "CKTOTAL", "CKMB", "CKMBINDEX", "TROPONINI" No results found for: "DDIMER" Does the Patient currently have chest pain? No    R Recommendations: See Admitting Provider Note  Report given to:   Additional Notes:  Pt is aox4, lovely, able to verbalize needs, purewick in place, takes meds whole, K was hard for her to swallow though, uses a cane, feeds her self

## 2023-02-16 ENCOUNTER — Observation Stay (HOSPITAL_BASED_OUTPATIENT_CLINIC_OR_DEPARTMENT_OTHER): Payer: Medicare Other

## 2023-02-16 ENCOUNTER — Other Ambulatory Visit (HOSPITAL_COMMUNITY): Payer: Self-pay

## 2023-02-16 DIAGNOSIS — R001 Bradycardia, unspecified: Secondary | ICD-10-CM | POA: Diagnosis not present

## 2023-02-16 DIAGNOSIS — I491 Atrial premature depolarization: Secondary | ICD-10-CM | POA: Diagnosis not present

## 2023-02-16 DIAGNOSIS — I4892 Unspecified atrial flutter: Secondary | ICD-10-CM

## 2023-02-16 DIAGNOSIS — I4891 Unspecified atrial fibrillation: Secondary | ICD-10-CM

## 2023-02-16 LAB — BASIC METABOLIC PANEL
Anion gap: 7 (ref 5–15)
BUN: 14 mg/dL (ref 8–23)
CO2: 27 mmol/L (ref 22–32)
Calcium: 8.3 mg/dL — ABNORMAL LOW (ref 8.9–10.3)
Chloride: 106 mmol/L (ref 98–111)
Creatinine, Ser: 0.71 mg/dL (ref 0.44–1.00)
GFR, Estimated: >60 mL/min (ref >60)
Glucose, Bld: 108 mg/dL — ABNORMAL HIGH (ref 70–99)
Potassium: 3.4 mmol/L — ABNORMAL LOW (ref 3.5–5.1)
Sodium: 140 mmol/L (ref 135–145)

## 2023-02-16 LAB — ECHOCARDIOGRAM COMPLETE
Area-P 1/2: 2.71 cm2
Height: 61 in
MV M vel: 5.16 m/s
MV Peak grad: 106.5 mmHg
P 1/2 time: 520 msec
S' Lateral: 2.3 cm
Weight: 3245.17 oz

## 2023-02-16 LAB — CBC
HCT: 37.3 % (ref 36.0–46.0)
Hemoglobin: 11.9 g/dL — ABNORMAL LOW (ref 12.0–15.0)
MCH: 28.3 pg (ref 26.0–34.0)
MCHC: 31.9 g/dL (ref 30.0–36.0)
MCV: 88.6 fL (ref 80.0–100.0)
Platelets: 279 10*3/uL (ref 150–400)
RBC: 4.21 MIL/uL (ref 3.87–5.11)
RDW: 14.3 % (ref 11.5–15.5)
WBC: 6 10*3/uL (ref 4.0–10.5)
nRBC: 0 % (ref 0.0–0.2)

## 2023-02-16 MED ORDER — APIXABAN 5 MG PO TABS: 5.0000 mg | ORAL_TABLET | Freq: Two times a day (BID) | ORAL | 1 refills | Status: DC

## 2023-02-16 MED ORDER — SPIRONOLACTONE 25 MG PO TABS: 25.0000 mg | ORAL_TABLET | Freq: Every day | ORAL | 1 refills | Status: AC

## 2023-02-16 MED ORDER — METOPROLOL TARTRATE 25 MG PO TABS: 25.0000 mg | ORAL_TABLET | Freq: Two times a day (BID) | ORAL | 1 refills | Status: DC

## 2023-02-16 MED ORDER — SPIRONOLACTONE 25 MG PO TABS: 25.0000 mg | ORAL_TABLET | Freq: Every day | ORAL | 1 refills | Status: DC

## 2023-02-16 NOTE — TOC Transition Note (Signed)
Transition of Care [facility]) - CM/SW Discharge Note   Patient Details  Name: Jenna [name] MRN: [id number] Date of Birth: Dec 18, 1931  [facility] CM/SW Contact:  Zenon Mayo, RN Phone Number: 02/16/2023, 3:11 PM   Clinical Narrative:    Patient is for dc today, per benefit check for eliquis , [facility] is the only one who can take her insurance, NCM asked MD to send scripts to [place] [facility] to be filled since this is the case.  NCM gave Staff RN a 30 day coupon to give to patient for eliquis.          Patient Goals and CMS Choice      Discharge Placement                         Discharge Plan and Services Additional resources added to the After Visit Summary for                                       Social Determinants of Health (SDOH) Interventions SDOH Screenings   Food Insecurity: No Food Insecurity (02/15/2023)  Housing: Low Risk  (02/15/2023)  Transportation Needs: No Transportation Needs (02/15/2023)  Utilities: Not At Risk (02/15/2023)  Tobacco Use: Low Risk  (02/15/2023)     Readmission Risk Interventions     No data to display

## 2023-02-16 NOTE — Progress Notes (Signed)
  Echocardiogram 2D Echocardiogram has been performed.  Jenna [name] 02/16/2023, 11:49 AM

## 2023-02-16 NOTE — Discharge Summary (Signed)
Jenna [name] [id number] DOB: 09/25/1931 DOA: 02/15/2023  PCP: Lujean Amel, MD  Admit date: 02/15/2023 Discharge date: 02/16/2023  Time spent: 40 minutes  Recommendations for Outpatient Follow-up:  Cardiology and pcp f/u     Discharge Diagnoses:  Principal Problem:   New onset atrial fibrillation ([place]) Active Problems:   Elevated troponin   Hypocalcemia   HTN (hypertension)   Non-insulin dependent type 2 diabetes mellitus ([place])   Hypercholesteremia   History of cellulitis   Obesity (BMI 30-39.9)   Discharge Condition: stable  Diet recommendation: heart healthy  Filed Weights   02/15/23 0647 02/16/23 0340  Weight: 92.5 kg 92 kg    History of present illness:  From admission h and p Jenna [name] is a 89 y.o. female with medical history significant of hypertension, hyperlipidemia, diastolic dysfunction, diabetes mellitus type 2, cellulitis, endometrial cancer who presented initially with complaints of right shoulder discomfort over the last 2 days.  She describes it as a discomfort that radiated down her right arm and she had taken 3 aspirin with improvement in symptoms the other evening.  However her symptoms returned yesterday going down her arm.  She tried to take aspirin again, but noted no improvement in symptoms at this time for which she became more concerned.  Denied having any fevers, chest pain, palpitations, nausea, vomiting, or diarrhea.  She has been dealing with cellulitis of the left lower extremity for which she has swelling, but states has been wearing compression stockings with improvement in symptoms.  Plan is for her possibly to have a vein removed from the leg to decrease the risk of recurrence of the cellulitis.   En route with EMS patient was noted to be in atrial fibrillation with heart rates elevated up to the 180s.  Given diltiazem 10 mg IV and 300 mL normal saline IV fluids.   In the emergency department patient was noted to be afebrile with  heart rates elevated into the 120s, blood pressures elevated to 164/95, and all other vital signs maintained.  Labs noted potassium 3.6, calcium 8.1, ionized calcium 1.09, magnesium 2, and high-sensitivity troponin 21->19.  Hospital Course:  Patient presented with chest pain radiating to right arm. Found to be in a fib with rvr. This is a new diagnosis. Chads2vasc is 5. She was transitioned from home coreg to metoprolol and started on apixaban. Hr improved to normal. Tte with grade 3 dd, mild/mod mitral regurg. Patient ambulated without difficulty on hospital day 1. She will be discharged with metoprolol to substitute her home coreg, and apixaban. Cardiology advises holding home lasix, re-starting prior spironolactone. Has a-fib clinic f/u scheduled on 2/22.  Procedures: none   Consultations: cardiology  Discharge Exam: Vitals:   02/16/23 1143 02/16/23 1509  BP: (!) 140/55 (!) 149/68  Pulse: (!) 52 (!) 54  Resp: 15 17  Temp: 97.6 F (36.4 C) 97.9 F (36.6 C)  SpO2: 100% 100%    General: nad Cardiovascular: rrr Respiratory: ctab  Discharge Instructions   Discharge Instructions     Amb referral to AFIB Clinic   Complete by: As directed    Diet - low sodium heart healthy   Complete by: As directed    Increase activity slowly   Complete by: As directed       Allergies as of 02/16/2023       Reactions   Shrimp [shellfish Allergy] Anaphylaxis   Ancef [cefazolin] Nausea And Vomiting   Benazepril Cough  Medication List     STOP taking these medications    amLODipine 5 MG tablet Commonly known as: NORVASC   aspirin 325 MG tablet   carvedilol 3.125 MG tablet Commonly known as: COREG   doxycycline 100 MG capsule Commonly known as: VIBRAMYCIN   furosemide 40 MG tablet Commonly known as: LASIX   naproxen 375 MG tablet Commonly known as: NAPROSYN       TAKE these medications    apixaban 5 MG Tabs tablet Commonly known as: ELIQUIS Take 1 tablet (5  mg total) by mouth 2 (two) times daily.   atorvastatin 20 MG tablet Commonly known as: LIPITOR Take 20 mg by mouth daily.   brimonidine-timolol 0.2-0.5 % ophthalmic solution Commonly known as: COMBIGAN Place 1 drop into both eyes every 12 (twelve) hours.   latanoprost 0.005 % ophthalmic solution Commonly known as: XALATAN Place 1 drop into both eyes at bedtime.   metoprolol tartrate 25 MG tablet Commonly known as: LOPRESSOR Take 1 tablet (25 mg total) by mouth 2 (two) times daily.   spironolactone 25 MG tablet Commonly known as: ALDACTONE Take 1 tablet (25 mg total) by mouth daily. Start taking on: February 17, 2023   telmisartan 80 MG tablet Commonly known as: MICARDIS Take 80 mg by mouth daily.   tolterodine 4 MG 24 hr capsule Commonly known as: DETROL LA Take 4 mg by mouth daily.       Allergies  Allergen Reactions   Shrimp [Shellfish Allergy] Anaphylaxis   Ancef [Cefazolin] Nausea And Vomiting   Benazepril Cough    Follow-up Information     [facility] Follow up on 02/19/2023.   Specialty: Cardiology Why: 2:00PM. Follow up with afib clinic as outpatient. Contact information: [address] [id number] mc [place] [id number] [phone]        Lujean Amel, MD Follow up.   Specialty: Family Medicine Contact information: [place] [address] [phone]                  The results of significant diagnostics from this hospitalization (including imaging, microbiology, ancillary and laboratory) are listed below for reference.    Significant Diagnostic Studies: ECHOCARDIOGRAM COMPLETE  Result Date: 02/16/2023    ECHOCARDIOGRAM REPORT   Patient Name:   Jenna [name] Date of Exam: 02/16/2023 Medical Rec #:  [id number]        Height:       61.0 in Accession #:    [id number]       Weight:       202.8 lb Date of Birth:  [birth year]        BSA:           1.900 m Patient Age:    89 years         BP:           172/66 mmHg Patient Gender: F                HR:           57 bpm. Exam Location:  Inpatient Procedure: 2D Echo, 3D Echo, Cardiac Doppler and Color Doppler Indications:    Atrial Fibrillation I48.91  History:        Patient has prior history of Echocardiogram examinations, most                 recent 01/23/2017. Arrythmias:Atrial Fibrillation; Risk  Factors:Hypertension, Dyslipidemia, Diabetes, Sleep Apnea and                 Non-Smoker.  Sonographer:    Greer Pickerel Referring Phys: [id number] RONDELL A SMITH  Sonographer Comments: Image acquisition challenging due to patient body habitus and Image acquisition challenging due to respiratory motion. IMPRESSIONS  1. Left ventricular ejection fraction, by estimation, is 60 to 65%. The left ventricle has normal function. The left ventricle has no regional wall motion abnormalities. Left ventricular diastolic parameters are consistent with Grade III diastolic dysfunction (restrictive).  2. Right ventricular systolic function is normal. The right ventricular size is normal. There is normal pulmonary artery systolic pressure.  3. Left atrial size was moderately dilated.  4. Right atrial size was mildly dilated.  5. The mitral valve is normal in structure. Mild to moderate mitral valve regurgitation. No evidence of mitral stenosis.  6. The aortic valve is tricuspid. There is mild calcification of the aortic valve. Aortic valve regurgitation is mild to moderate. Aortic valve sclerosis/calcification is present, without any evidence of aortic stenosis.  7. The inferior vena cava is normal in size with greater than 50% respiratory variability, suggesting right atrial pressure of 3 mmHg. FINDINGS  Left Ventricle: Left ventricular ejection fraction, by estimation, is 60 to 65%. The left ventricle has normal function. The left ventricle has no regional wall motion abnormalities. The left ventricular internal  cavity size was normal in size. There is  no left ventricular hypertrophy. Left ventricular diastolic function could not be evaluated due to atrial fibrillation. Left ventricular diastolic parameters are consistent with Grade III diastolic dysfunction (restrictive). Right Ventricle: The right ventricular size is normal. No increase in right ventricular wall thickness. Right ventricular systolic function is normal. There is normal pulmonary artery systolic pressure. The tricuspid regurgitant velocity is 2.25 m/s, and  with an assumed right atrial pressure of 8 mmHg, the estimated right ventricular systolic pressure is [id number] mmHg. Left Atrium: Left atrial size was moderately dilated. Right Atrium: Right atrial size was mildly dilated. Pericardium: There is no evidence of pericardial effusion. Mitral Valve: The mitral valve is normal in structure. Mild mitral annular calcification. Mild to moderate mitral valve regurgitation. No evidence of mitral valve stenosis. Tricuspid Valve: The tricuspid valve is normal in structure. Tricuspid valve regurgitation is mild . No evidence of tricuspid stenosis. Aortic Valve: The aortic valve is tricuspid. There is mild calcification of the aortic valve. Aortic valve regurgitation is mild to moderate. Aortic regurgitation PHT measures 520 msec. Aortic valve sclerosis/calcification is present, without any evidence of aortic stenosis. Pulmonic Valve: The pulmonic valve was normal in structure. Pulmonic valve regurgitation is trivial. No evidence of pulmonic stenosis. Aorta: The aortic root is normal in size and structure. Venous: The inferior vena cava is normal in size with greater than 50% respiratory variability, suggesting right atrial pressure of 3 mmHg. IAS/Shunts: No atrial level shunt detected by color flow Doppler.  LEFT VENTRICLE PLAX 2D LVIDd:         4.20 cm   Diastology LVIDs:         2.30 cm   LV e' medial:    3.59 cm/s LV PW:         1.10 cm   LV E/e' medial:  42.9 LV IVS:         1.10 cm   LV e' lateral:   7.10 cm/s LVOT diam:     1.60 cm   LV E/e' lateral: 21.7 LV SV:  49 LV SV Index:   26 LVOT Area:     2.01 cm                           3D Volume EF:                          3D EF:        52 %                          LV EDV:       119 ml                          LV ESV:       57 ml                          LV SV:        62 ml RIGHT VENTRICLE RV S prime:     9.29 cm/s TAPSE (M-mode): 1.4 cm LEFT ATRIUM              Index        RIGHT ATRIUM           Index LA diam:        4.00 cm  2.11 cm/m   RA Area:     17.00 cm LA Vol (A2C):   67.5 ml  35.53 ml/m  RA Volume:   45.10 ml  23.74 ml/m LA Vol (A4C):   107.0 ml 56.31 ml/m LA Biplane Vol: 88.7 ml  46.68 ml/m  AORTIC VALVE LVOT Vmax:   95.60 cm/s LVOT Vmean:  66.900 cm/s LVOT VTI:    0.246 m AI PHT:      520 msec  AORTA Ao Root diam: 3.00 cm Ao Asc diam:  3.35 cm MITRAL VALVE                TRICUSPID VALVE MV Area (PHT): 2.71 cm     TR Peak grad:   20.2 mmHg MV Decel Time: 280 msec     TR Vmax:        225.00 cm/s MR Peak grad: 106.5 mmHg MR Vmax:      516.00 cm/s   SHUNTS MV E velocity: 154.00 cm/s  Systemic VTI:  0.25 m MV A velocity: 79.70 cm/s   Systemic Diam: 1.60 cm MV E/A ratio:  1.93 Glori Bickers MD Electronically signed by Glori Bickers MD Signature Date/Time: 02/16/2023/2:07:33 PM    Final    DG Chest Port 1 View  Result Date: 02/15/2023 CLINICAL DATA:  Atrial fibrillation EXAM: PORTABLE CHEST 1 VIEW COMPARISON:  09/24/2019 FINDINGS: Normal heart size and stable mediastinal contours. No acute infiltrate or edema. No effusion or pneumothorax. No acute osseous findings. Artifact from EKG leads. IMPRESSION: No active disease. Electronically Signed   By: Jorje Guild M.D.   On: 02/15/2023 07:17    Microbiology: No results found for this or any previous visit (from the past 240 hour(s)).   Labs: Basic Metabolic Panel: Recent Labs  Lab 02/15/23 0653 02/15/23 0659 02/16/23 0100  NA 141 145 140   K 3.6 3.1* 3.4*  CL 103 106 106  CO2 25  --  27  GLUCOSE 125* 125* 108*  BUN 12 12 14  $ CREATININE 0.75 0.50 0.71  CALCIUM 8.8*  --  8.3*  MG 2.0  --   --    Liver Function Tests: No results for input(s): "AST", "ALT", "ALKPHOS", "BILITOT", "PROT", "ALBUMIN" in the last 168 hours. No results for input(s): "LIPASE", "AMYLASE" in the last 168 hours. No results for input(s): "AMMONIA" in the last 168 hours. CBC: Recent Labs  Lab 02/15/23 0653 02/15/23 0659 02/16/23 0100  WBC 6.3  --  6.0  HGB 13.9 14.6 11.9*  HCT 43.0 43.0 37.3  MCV 87.8  --  88.6  PLT 305  --  279   Cardiac Enzymes: No results for input(s): "CKTOTAL", "CKMB", "CKMBINDEX", "TROPONINI" in the last 168 hours. BNP: BNP (last 3 results) No results for input(s): "BNP" in the last 8760 hours.  ProBNP (last 3 results) No results for input(s): "PROBNP" in the last 8760 hours.  CBG: No results for input(s): "GLUCAP" in the last 168 hours.     Signed:  Desma Maxim MD.  [facility] 02/16/2023, 3:29 PM

## 2023-02-16 NOTE — Progress Notes (Signed)
Mobility Specialist Progress Note:   02/16/23 1020  Mobility  Activity Ambulated with assistance in hallway  Level of Assistance Contact guard assist, steadying assist  Assistive Device Cane  Distance Ambulated (ft) 120 ft  Activity Response Tolerated well  $Mobility charge 1 Mobility   Pre- Mobility: 58 HR During Mobility: 74 HR Post Mobility:  56 HR  Pt in bed willing to participate in mobility. No complaints of pain. Left in bed with call bell in reach and all needs met.  Gareth Eagle Tannon Peerson Mobility Specialist Please contact via [employer] or  Rehab Office at [phone]

## 2023-02-16 NOTE — TOC Benefit Eligibility Note (Signed)
Patient Teacher, English as a foreign language completed.    The patient is currently admitted and upon discharge could be taking Eliquis 5 mg.  The current 30 day co-pay is $111.63 Can use Copay Card.   The patient is insured through [employer] ([place] is the only [place] that takes her insurance)   Lyndel Safe, [place] Patient Advocate Specialist [place] Patient Advocate Team Direct Number: [phone]  Fax: [phone]

## 2023-02-16 NOTE — Care Management Obs Status (Signed)
[facility] NOTIFICATION   Patient Details  Name: Jenna [name] MRN: [id number] Date of Birth: June 04, 1931   Medicare Observation Status Notification Given:  Yes    Zenon Mayo, RN 02/16/2023, 3:24 PM

## 2023-02-16 NOTE — TOC Progression Note (Signed)
Transition of Care [facility]) - Progression Note    Patient Details  Name: Jenna [name] MRN: [id number] Date of Birth: Dec 11, 1931  Transition of Care [facility]) CM/SW Contact  Zenon Mayo, RN Phone Number: 02/16/2023, 12:12 PM  Clinical Narrative:    rom home with spouse, afib now NSR, waiting to get echo. On Eliquis - New.  TOC following.         Expected Discharge Plan and Services                                               Social Determinants of Health (SDOH) Interventions SDOH Screenings   Food Insecurity: No Food Insecurity (02/15/2023)  Housing: Low Risk  (02/15/2023)  Transportation Needs: No Transportation Needs (02/15/2023)  Utilities: Not At Risk (02/15/2023)  Tobacco Use: Low Risk  (02/15/2023)    Readmission Risk Interventions     No data to display

## 2023-02-17 ENCOUNTER — Encounter: Payer: Self-pay | Admitting: Internal Medicine

## 2023-02-17 ENCOUNTER — Telehealth: Payer: Self-pay | Admitting: Obstetrics and Gynecology

## 2023-02-17 ENCOUNTER — Ambulatory Visit (HOSPITAL_COMMUNITY)
Admission: RE | Admit: 2023-02-17 | Discharge: 2023-02-17 | Disposition: A | Discharge status: Home / Self Care | Payer: Medicare Other | Source: Ambulatory Visit | Attending: Cardiovascular Disease | Admitting: Cardiovascular Disease

## 2023-02-17 ENCOUNTER — Encounter (HOSPITAL_COMMUNITY): Payer: Self-pay | Admitting: Internal Medicine

## 2023-02-17 ENCOUNTER — Other Ambulatory Visit: Payer: Self-pay | Admitting: *Deleted

## 2023-02-17 DIAGNOSIS — I4892 Unspecified atrial flutter: Secondary | ICD-10-CM

## 2023-02-17 DIAGNOSIS — R001 Bradycardia, unspecified: Secondary | ICD-10-CM

## 2023-02-17 DIAGNOSIS — I4891 Unspecified atrial fibrillation: Secondary | ICD-10-CM

## 2023-02-17 DIAGNOSIS — I491 Atrial premature depolarization: Secondary | ICD-10-CM

## 2023-02-17 LAB — CALCIUM, IONIZED: Calcium, Ionized, Serum: 4.9 mg/dL (ref 4.5–5.6)

## 2023-02-17 NOTE — Telephone Encounter (Signed)
Opened in error

## 2023-02-18 DIAGNOSIS — I4892 Unspecified atrial flutter: Secondary | ICD-10-CM | POA: Diagnosis not present

## 2023-02-18 DIAGNOSIS — I4891 Unspecified atrial fibrillation: Secondary | ICD-10-CM | POA: Diagnosis not present

## 2023-02-19 ENCOUNTER — Encounter (HOSPITAL_COMMUNITY): Payer: Self-pay | Admitting: Physician Assistant

## 2023-02-19 ENCOUNTER — Ambulatory Visit (HOSPITAL_COMMUNITY)
Admit: 2023-02-19 | Discharge: 2023-02-19 | Disposition: A | Discharge status: Home / Self Care | Payer: Medicare Other | Attending: Physician Assistant | Admitting: Physician Assistant

## 2023-02-19 VITALS — BP 114/68 | HR 49 | Ht 61.0 in | Wt 201.6 lb

## 2023-02-19 DIAGNOSIS — I517 Cardiomegaly: Secondary | ICD-10-CM | POA: Insufficient documentation

## 2023-02-19 DIAGNOSIS — D6869 Other thrombophilia: Secondary | ICD-10-CM | POA: Insufficient documentation

## 2023-02-19 DIAGNOSIS — I480 Paroxysmal atrial fibrillation: Secondary | ICD-10-CM | POA: Diagnosis not present

## 2023-02-19 DIAGNOSIS — R001 Bradycardia, unspecified: Secondary | ICD-10-CM | POA: Diagnosis not present

## 2023-02-19 MED ORDER — APIXABAN 5 MG PO TABS: 5.0000 mg | ORAL_TABLET | Freq: Two times a day (BID) | ORAL | 2 refills | Status: DC

## 2023-02-19 MED ORDER — METOPROLOL TARTRATE 25 MG PO TABS: 25.0000 mg | ORAL_TABLET | Freq: Two times a day (BID) | ORAL | 3 refills | Status: DC

## 2023-02-19 NOTE — Progress Notes (Signed)
Primary Care Physician: Lujean Amel, MD Primary Cardiologist: none Primary Electrophysiologist: Dr Myles Gip Referring Physician: Dr Evlyn Clines is a 89 y.o. female with a history of hypertension, hyperlipidemia, obesity, DM type II, endometrial cancer, OSA, atrial fibrillation who presents for follow up in the [facility].  The patient was initially diagnosed with atrial fibrillation 02/15/23 after presenting to the ED with symptoms of right shoulder pain. She was found to be in afib with rapid rates. She converted to SR with IV diltiazem. Patient is on Eliquis for a CHADS2VASC score of 5. She was discharged with oral metoprolol.   On follow up today, patient reports that she feels well today. She has not had any more shoulder pain. She remains in SR. Patient is bradycardic today but denies any fatigue, dizziness, or presyncope. She is currently wearing a cardiac monitor. She reports having rheumatic fever as a child.   Today, she denies symptoms of palpitations, chest pain, shortness of breath, orthopnea, PND, lower extremity edema, dizziness, presyncope, syncope, snoring, daytime somnolence, bleeding, or neurologic sequela. The patient is tolerating medications without difficulties and is otherwise without complaint today.    Atrial Fibrillation Risk Factors:  she does have symptoms or diagnosis of sleep apnea. she is not compliant with CPAP therapy. she does have a history of rheumatic fever. she does not have a history of alcohol use. The patient does not have a history of early familial atrial fibrillation or other arrhythmias.  she has a BMI of Body mass index is 38.09 kg/m.Marland Kitchen Filed Weights   02/19/23 1359  Weight: 91.4 kg    Family History  Problem Relation Age of Onset   Cancer Brother 29       rectal   Early death Brother      Atrial Fibrillation Management history:  Previous antiarrhythmic drugs: none Previous  cardioversions: none Previous ablations: none CHADS2VASC score: 5 Anticoagulation history: Eliquis   Past Medical History:  Diagnosis Date   Arthritis    Cancer ([place])    endometrial cancer   Cellulitis of left lower extremity    Diabetes mellitus    Hyperlipemia    Hypertension    Non-healing ulcer of lower leg (HCC)    Left leg     OAB (overactive bladder)    Obesity    BMI 45   Past Surgical History:  Procedure Laterality Date   ABDOMINAL HYSTERECTOMY  02/2008   robotic hyst BSO Bilateral LND   EYE SURGERY     cataract   IR GENERIC HISTORICAL  09/30/2016   IR EMBO VENOUS NOT HEMORR HEMANG  INC [facility]   JOINT REPLACEMENT     knee   KNEE SURGERY     left knee   LAPAROSCOPIC CHOLECYSTECTOMY SINGLE PORT N/A 07/04/2014   Procedure: LAPAROSCOPIC CHOLECYSTECTOMY SINGLE PORT;  Surgeon: Adin Hector, MD;  Location: [facility] ORS;  Service: General;  Laterality: N/A;   ROTATOR CUFF REPAIR     right arm   TUBAL LIGATION      Current Outpatient Medications  Medication Sig Dispense Refill   apixaban (ELIQUIS) 5 MG TABS tablet Take 1 tablet (5 mg total) by mouth 2 (two) times daily. 180 tablet 1   atorvastatin (LIPITOR) 20 MG tablet Take 20 mg by mouth daily.       brimonidine-timolol (COMBIGAN) 0.2-0.5 % ophthalmic solution Place 1 drop into both eyes every 12 (twelve) hours.     latanoprost (XALATAN)  0.005 % ophthalmic solution Place 1 drop into both eyes at bedtime.      metoprolol tartrate (LOPRESSOR) 25 MG tablet Take 1 tablet (25 mg total) by mouth 2 (two) times daily. 60 tablet 1   spironolactone (ALDACTONE) 25 MG tablet Take 1 tablet (25 mg total) by mouth daily. 30 tablet 1   telmisartan (MICARDIS) 80 MG tablet Take 80 mg by mouth daily.  3   tolterodine (DETROL LA) 4 MG 24 hr capsule Take 4 mg by mouth daily.  4   No current facility-administered medications for this encounter.    Allergies  Allergen Reactions   Shrimp [Shellfish Allergy]  Anaphylaxis   Ancef [Cefazolin] Nausea And Vomiting   Benazepril Cough    Social History   Socioeconomic History   Marital status: Married    Spouse name: Not on file   Number of children: Not on file   Years of education: Not on file   Highest education level: Not on file  Occupational History   Not on file  Tobacco Use   Smoking status: Never   Smokeless tobacco: Never   Tobacco comments:    Never smoke 02/19/23  Substance and Sexual Activity   Alcohol use: No   Drug use: No   Sexual activity: Never    Birth control/protection: Surgical  Other Topics Concern   Not on file  Social History Narrative   Not on file   Social Determinants of Health   Financial Resource Strain: Not on file  Food Insecurity: No Food Insecurity (02/15/2023)   Hunger Vital Sign    Worried About Running Out of Food in the Last Year: Never true    Ran Out of Food in the Last Year: Never true  Transportation Needs: No Transportation Needs (02/15/2023)   PRAPARE - Hydrologist (Medical): No    Lack of Transportation (Non-Medical): No  Physical Activity: Not on file  Stress: Not on file  Social Connections: Not on file  Intimate Partner Violence: Not At Risk (02/15/2023)   Humiliation, Afraid, Rape, and Kick questionnaire    Fear of Current or Ex-Partner: No    Emotionally Abused: No    Physically Abused: No    Sexually Abused: No     ROS- All systems are reviewed and negative except as per the HPI above.  Physical Exam: Vitals:   02/19/23 1359  BP: 114/68  Pulse: (!) 49  Weight: 91.4 kg  Height: 5' 1"$  (1.549 m)    GEN- The patient is a well appearing elderly obese female, alert and oriented x 3 today.   Head- normocephalic, atraumatic Eyes-  Sclera clear, conjunctiva pink Ears- hearing intact Oropharynx- clear Neck- supple  Lungs- Clear to ausculation bilaterally, normal work of breathing Heart- Regular rate and rhythm, bradycardia, no murmurs, rubs  or gallops  GI- soft, NT, ND, + BS Extremities- no clubbing, cyanosis, or edema MS- no significant deformity or atrophy Skin- no rash or lesion Psych- euthymic mood, full affect Neuro- strength and sensation are intact  Wt Readings from Last 3 Encounters:  02/19/23 91.4 kg  02/16/23 92 kg  09/24/19 99.8 kg    EKG today demonstrates  SB, T wave changes chronic Vent. rate 49 BPM PR interval 124 ms QRS duration 78 ms QT/QTcB 430/388 ms  Echo 02/16/23 demonstrated   1. Left ventricular ejection fraction, by estimation, is 60 to 65%. The  left ventricle has normal function. The left ventricle has no regional  wall motion abnormalities. Left ventricular diastolic parameters are  consistent with Grade III diastolic dysfunction (restrictive).   2. Right ventricular systolic function is normal. The right ventricular  size is normal. There is normal pulmonary artery systolic pressure.   3. Left atrial size was moderately dilated.   4. Right atrial size was mildly dilated.   5. The mitral valve is normal in structure. Mild to moderate mitral valve  regurgitation. No evidence of mitral stenosis.   6. The aortic valve is tricuspid. There is mild calcification of the  aortic valve. Aortic valve regurgitation is mild to moderate. Aortic valve  sclerosis/calcification is present, without any evidence of aortic  stenosis.   7. The inferior vena cava is normal in size with greater than 50%  respiratory variability, suggesting right atrial pressure of 3 mmHg.   Epic records are reviewed at length today  CHA2DS2-VASc Score = 5  The patient's score is based upon: CHF History: 0 HTN History: 1 Diabetes History: 1 Stroke History: 0 Vascular Disease History: 0 Age Score: 2 Gender Score: 1       ASSESSMENT AND PLAN: 1. Paroxysmal Atrial Fibrillation (ICD10:  I48.0) The patient's CHA2DS2-VASc score is 5, indicating a 7.2% annual risk of stroke.   Patient appears to be maintaining SR.   Patient currently wearing cardiac monitor. Continue Eliquis 5 mg BID (weight >60 kg, Cr < 1.5) Continue Lopressor 25 mg BID. She is asymptomatic with her bradycardia, we discussed decreasing BB, she would like to continue present dose for now.   2. Secondary Hypercoagulable State (ICD10:  D68.69) The patient is at significant risk for stroke/thromboembolism based upon her CHA2DS2-VASc Score of 5.  Continue Apixaban (Eliquis).   3. Obesity Body mass index is 38.09 kg/m. Lifestyle modification was discussed at length including regular exercise and weight reduction.  4. Obstructive sleep apnea Sleep study 2018 showed mild OSA She is not on CPAP.  5. Chronic HFpEF Grade III diastolic dysfunction on echo Fluid status appears stable.   6. HTN Stable, no changes today.   Follow up with Dr Myles Gip in 3 months. Sooner in AF clinic pending monitor results.    [facility] [address] [place], [place] [address] [phone] 02/19/2023 2:17 PM

## 2023-02-24 DIAGNOSIS — Z79899 Other long term (current) drug therapy: Secondary | ICD-10-CM | POA: Diagnosis not present

## 2023-02-24 DIAGNOSIS — D6859 Other primary thrombophilia: Secondary | ICD-10-CM | POA: Diagnosis not present

## 2023-02-24 DIAGNOSIS — I10 Essential (primary) hypertension: Secondary | ICD-10-CM | POA: Diagnosis not present

## 2023-02-24 DIAGNOSIS — I5189 Other ill-defined heart diseases: Secondary | ICD-10-CM | POA: Diagnosis not present

## 2023-02-24 DIAGNOSIS — I480 Paroxysmal atrial fibrillation: Secondary | ICD-10-CM | POA: Diagnosis not present

## 2023-02-26 DIAGNOSIS — E119 Type 2 diabetes mellitus without complications: Secondary | ICD-10-CM | POA: Diagnosis not present

## 2023-03-29 DIAGNOSIS — E119 Type 2 diabetes mellitus without complications: Secondary | ICD-10-CM | POA: Diagnosis not present

## 2023-04-28 DIAGNOSIS — E119 Type 2 diabetes mellitus without complications: Secondary | ICD-10-CM | POA: Diagnosis not present

## 2023-04-29 DIAGNOSIS — H59032 Cystoid macular edema following cataract surgery, left eye: Secondary | ICD-10-CM | POA: Diagnosis not present

## 2023-04-29 DIAGNOSIS — H401112 Primary open-angle glaucoma, right eye, moderate stage: Secondary | ICD-10-CM | POA: Diagnosis not present

## 2023-04-29 DIAGNOSIS — H401123 Primary open-angle glaucoma, left eye, severe stage: Secondary | ICD-10-CM | POA: Diagnosis not present

## 2023-04-29 DIAGNOSIS — H25811 Combined forms of age-related cataract, right eye: Secondary | ICD-10-CM | POA: Diagnosis not present

## 2023-04-29 DIAGNOSIS — H43813 Vitreous degeneration, bilateral: Secondary | ICD-10-CM | POA: Diagnosis not present

## 2023-05-29 DIAGNOSIS — E119 Type 2 diabetes mellitus without complications: Secondary | ICD-10-CM | POA: Diagnosis not present

## 2023-06-03 DIAGNOSIS — R5383 Other fatigue: Secondary | ICD-10-CM | POA: Diagnosis not present

## 2023-06-03 DIAGNOSIS — I480 Paroxysmal atrial fibrillation: Secondary | ICD-10-CM | POA: Diagnosis not present

## 2023-06-03 DIAGNOSIS — R634 Abnormal weight loss: Secondary | ICD-10-CM | POA: Diagnosis not present

## 2023-06-03 DIAGNOSIS — R413 Other amnesia: Secondary | ICD-10-CM | POA: Diagnosis not present

## 2023-06-11 ENCOUNTER — Other Ambulatory Visit (HOSPITAL_COMMUNITY): Payer: Self-pay | Admitting: Physician Assistant

## 2023-06-15 ENCOUNTER — Ambulatory Visit (HOSPITAL_COMMUNITY)
Admission: RE | Admit: 2023-06-15 | Discharge: 2023-06-15 | Disposition: A | Discharge status: Home / Self Care | Payer: Medicare Other | Source: Ambulatory Visit | Attending: Physician Assistant | Admitting: Physician Assistant

## 2023-06-15 ENCOUNTER — Encounter (HOSPITAL_COMMUNITY): Payer: Self-pay | Admitting: Physician Assistant

## 2023-06-15 VITALS — BP 118/72 | HR 54 | Ht 61.0 in | Wt 199.0 lb

## 2023-06-15 DIAGNOSIS — Z79899 Other long term (current) drug therapy: Secondary | ICD-10-CM | POA: Insufficient documentation

## 2023-06-15 DIAGNOSIS — Z8619 Personal history of other infectious and parasitic diseases: Secondary | ICD-10-CM | POA: Insufficient documentation

## 2023-06-15 DIAGNOSIS — I480 Paroxysmal atrial fibrillation: Secondary | ICD-10-CM | POA: Insufficient documentation

## 2023-06-15 DIAGNOSIS — I5032 Chronic diastolic (congestive) heart failure: Secondary | ICD-10-CM | POA: Diagnosis not present

## 2023-06-15 DIAGNOSIS — I491 Atrial premature depolarization: Secondary | ICD-10-CM | POA: Diagnosis not present

## 2023-06-15 DIAGNOSIS — G4733 Obstructive sleep apnea (adult) (pediatric): Secondary | ICD-10-CM | POA: Insufficient documentation

## 2023-06-15 DIAGNOSIS — I110 Hypertensive heart disease with heart failure: Secondary | ICD-10-CM | POA: Diagnosis not present

## 2023-06-15 DIAGNOSIS — E669 Obesity, unspecified: Secondary | ICD-10-CM | POA: Insufficient documentation

## 2023-06-15 DIAGNOSIS — Z8542 Personal history of malignant neoplasm of other parts of uterus: Secondary | ICD-10-CM | POA: Diagnosis not present

## 2023-06-15 DIAGNOSIS — R001 Bradycardia, unspecified: Secondary | ICD-10-CM | POA: Insufficient documentation

## 2023-06-15 DIAGNOSIS — E785 Hyperlipidemia, unspecified: Secondary | ICD-10-CM | POA: Diagnosis not present

## 2023-06-15 DIAGNOSIS — Z6837 Body mass index (BMI) 37.0-37.9, adult: Secondary | ICD-10-CM | POA: Diagnosis not present

## 2023-06-15 DIAGNOSIS — E119 Type 2 diabetes mellitus without complications: Secondary | ICD-10-CM | POA: Insufficient documentation

## 2023-06-15 DIAGNOSIS — D6869 Other thrombophilia: Secondary | ICD-10-CM | POA: Insufficient documentation

## 2023-06-15 DIAGNOSIS — Z7901 Long term (current) use of anticoagulants: Secondary | ICD-10-CM | POA: Insufficient documentation

## 2023-06-15 MED ORDER — METOPROLOL TARTRATE 25 MG PO TABS: 12.5000 mg | ORAL_TABLET | Freq: Two times a day (BID) | ORAL | 1 refills | Status: DC

## 2023-06-15 NOTE — Progress Notes (Signed)
Primary Care Physician: Darrow Bussing, MD Primary Cardiologist: None Electrophysiologist: Maurice Small, MD    Referring Physician: Dr Windy Canny is a 89 y.o. female with a history of hypertension, hyperlipidemia, obesity, DM type II, endometrial cancer, OSA, atrial fibrillation who presents for follow up in the [facility]rial [facility]ibrillation [facility]. The patient was initially diagnosed with atrial fibrillation 02/15/23 after presenting to the ED with symptoms of right shoulder pain. She was found to be in afib with rapid rates. She converted to SR with IV diltiazem. Patient is on Eliquis for a CHADS2VASC score of 5. She was discharged with oral metoprolol.   She wore a cardiac monitor which showed no afib or sustained arrhythmias.   On follow up today, she remains in SR. Denies bleeding issues on anticoagulation. She has been fatigued recently. She has also noticed hair loss.   Today, she denies symptoms of palpitations, chest pain, shortness of breath, orthopnea, PND, lower extremity edema, dizziness, presyncope, syncope, snoring, daytime somnolence, bleeding, or neurologic sequela. The patient is tolerating medications without difficulties and is otherwise without complaint today.   ROS- All systems are reviewed and negative except as per the HPI above.   Atrial Fibrillation Risk Factors:  she does have symptoms or diagnosis of sleep apnea. she is not compliant with CPAP therapy. she does have a history of rheumatic fever. she does not have a history of alcohol use. The patient does not have a history of early familial atrial fibrillation or other arrhythmias.  she has a BMI of Body mass index is 37.6 kg/m.Marland Kitchen Filed Weights   06/15/23 1522  Weight: 90.3 kg     Atrial Fibrillation Management history:  Previous antiarrhythmic drugs: none Previous cardioversions: none Previous ablations: none Anticoagulation history: Eliquis   Physical Exam: BP 118/72    Pulse (!) 54   Ht 5\' 1"  (1.549 m)   Wt 90.3 kg   BMI 37.60 kg/m   GEN: Well nourished, well developed in no acute distress NECK: No JVD; No carotid bruits CARDIAC: Regular rate and rhythm, no murmurs, rubs, gallops RESPIRATORY:  Clear to auscultation without rales, wheezing or rhonchi  ABDOMEN: Soft, non-tender, non-distended EXTREMITIES:  No edema; No deformity   EKG today demonstrates  SB, NST, PAC Vent. rate 54 BPM PR interval 124 ms QRS duration 78 ms QT/QTcB 436/413 ms   Echo 02/16/23 demonstrated   1. Left ventricular ejection fraction, by estimation, is 60 to 65%. The  left ventricle has normal function. The left ventricle has no regional  wall motion abnormalities. Left ventricular diastolic parameters are  consistent with Grade III diastolic dysfunction (restrictive).   2. Right ventricular systolic function is normal. The right ventricular  size is normal. There is normal pulmonary artery systolic pressure.   3. Left atrial size was moderately dilated.   4. Right atrial size was mildly dilated.   5. The mitral valve is normal in structure. Mild to moderate mitral valve  regurgitation. No evidence of mitral stenosis.   6. The aortic valve is tricuspid. There is mild calcification of the  aortic valve. Aortic valve regurgitation is mild to moderate. Aortic valve  sclerosis/calcification is present, without any evidence of aortic  stenosis.   7. The inferior vena cava is normal in size with greater than 50%  respiratory variability, suggesting right atrial pressure of 3 mmHg.   ASSESSMENT & PLAN CHA2DS2-VASc Score = 5  The patient's score is based upon: CHF History: 0  HTN History: 1 Diabetes History: 1 Stroke History: 0 Vascular Disease History: 0 Age Score: 2 Gender Score: 1       ASSESSMENT AND PLAN: Paroxysmal Atrial Fibrillation (ICD10:  I48.0) The patient's CHA2DS2-VASc score is 5, indicating a 7.2% annual risk of stroke.   Patient appears to be  maintaining SR.  Continue Eliquis 5 mg BID Decrease Lopressor to 12.5 mg BID given fatigue and bradycardia.  Secondary Hypercoagulable State (ICD10:  D68.69) The patient is at significant risk for stroke/thromboembolism based upon her CHA2DS2-VASc Score of 5.  Continue Apixaban (Eliquis).   Obesity Body mass index is 37.6 kg/m.  Encouraged lifestyle modification  OSA  Sleep study 2018 showed mild OSA She is not on CPAP  HTN Stable on current regimen  Chronic HFpEF Appears euvolemic today. ? If spironolactone or BB could be contributing to hair loss. Will continue to monitor for now.    Follow up with Dr Nelly Laurence in 4-6 weeks.    Jorja Loa PA-C Afib [facility] [address] [place], [place] [address] [phone]

## 2023-06-15 NOTE — Patient Instructions (Signed)
Decrease metoprolol to 1/2 tablet twice a day  Follow up with Dr Nelly Laurence in 4-6 weeks - will have his office call to schedule.

## 2023-06-28 DIAGNOSIS — E119 Type 2 diabetes mellitus without complications: Secondary | ICD-10-CM | POA: Diagnosis not present

## 2023-06-30 DIAGNOSIS — H59032 Cystoid macular edema following cataract surgery, left eye: Secondary | ICD-10-CM | POA: Diagnosis not present

## 2023-06-30 DIAGNOSIS — H25811 Combined forms of age-related cataract, right eye: Secondary | ICD-10-CM | POA: Diagnosis not present

## 2023-06-30 DIAGNOSIS — H401112 Primary open-angle glaucoma, right eye, moderate stage: Secondary | ICD-10-CM | POA: Diagnosis not present

## 2023-06-30 DIAGNOSIS — H43813 Vitreous degeneration, bilateral: Secondary | ICD-10-CM | POA: Diagnosis not present

## 2023-06-30 DIAGNOSIS — H401123 Primary open-angle glaucoma, left eye, severe stage: Secondary | ICD-10-CM | POA: Diagnosis not present

## 2023-07-16 DIAGNOSIS — H401112 Primary open-angle glaucoma, right eye, moderate stage: Secondary | ICD-10-CM | POA: Diagnosis not present

## 2023-07-16 DIAGNOSIS — H401123 Primary open-angle glaucoma, left eye, severe stage: Secondary | ICD-10-CM | POA: Diagnosis not present

## 2023-07-16 DIAGNOSIS — H25811 Combined forms of age-related cataract, right eye: Secondary | ICD-10-CM | POA: Diagnosis not present

## 2023-07-16 DIAGNOSIS — H43813 Vitreous degeneration, bilateral: Secondary | ICD-10-CM | POA: Diagnosis not present

## 2023-07-27 ENCOUNTER — Encounter: Payer: Self-pay | Admitting: Cardiovascular Disease

## 2023-07-27 ENCOUNTER — Ambulatory Visit: Payer: Medicare Other | Attending: Cardiovascular Disease | Admitting: Cardiovascular Disease

## 2023-07-27 VITALS — BP 142/80 | HR 60 | Ht 61.0 in | Wt 202.6 lb

## 2023-07-27 DIAGNOSIS — I480 Paroxysmal atrial fibrillation: Secondary | ICD-10-CM | POA: Diagnosis not present

## 2023-07-27 NOTE — Progress Notes (Signed)
12  

## 2023-07-27 NOTE — Patient Instructions (Signed)
Medication Instructions:  Your physician recommends that you continue on your current medications as directed. Please refer to the Current Medication list given to you today. *If you need a refill on your cardiac medications before your next appointment, please call your pharmacy*    Follow-Up: At [facility], you and your health needs are our priority.  As part of our continuing mission to provide you with exceptional heart care, we have created designated Provider Care Teams.  These Care Teams include your primary Cardiologist (physician) and Advanced Practice Providers (APPs -  Physician Assistants and Nurse Practitioners) who all work together to provide you with the care you need, when you need it.  We recommend signing up for the patient portal called "MyChart".  Sign up information is provided on this After Visit Summary.  MyChart is used to connect with patients for Virtual Visits (Telemedicine).  Patients are able to view lab/test results, encounter notes, upcoming appointments, etc.  Non-urgent messages can be sent to your provider as well.   To learn more about what you can do with MyChart, go to [email or web address].    Your next appointment:   6 month(s)  Provider:   You will follow up in the Atrial Fibrillation Clinic located at [facility]. Your provider will be: Clint R. Fenton, PA-C

## 2023-07-27 NOTE — Progress Notes (Signed)
  Electrophysiology Office Note:    Date:  07/27/2023   ID:  Jenna [name], DOB [birth year], MRN [id number]  PCP:  Darrow Bussing, MD   [facility] Providers Cardiologist:  None Electrophysiologist:  Maurice Small, MD     Referring MD: Darrow Bussing, MD   History of Present Illness:    Jenna [name] is a 89 y.o. female with a medical history significant for hypertension, hyperlipidemia, obesity, type 2 diabetes, obstructive sleep apnea, referred for atrial fibrillation management.     She presented to the ER on February 15, 2023 with right shoulder pain and was found to be in atrial fibrillation with rapid ventricular rates, heart rate up to the 180s.  She converted to sinus rhythm with IV diltiazem.  She was discharged with metoprolol and Eliquis.  She notes that she did not have any palpitations with atrial fibrillation     Today, she reports that she is doing very well.  She has not had any symptoms to suggest recurrence of atrial fibrillation --though it should be noted she has never reliably had symptoms of atrial fibrillation. She has a history of right rotator cuff injury and repair.   EKGs/Labs/Other Studies Reviewed Today:    Echocardiogram:  TTE February 16, 2023 EF 60 to 65%,.  3 diastolic dysfunction. moderately dilated left atrium.   Monitors:  13 d 16hr Zio 02/2023 Sinus rhythm HR 42-107 bpm, AVG 60 Occasional (2.8) PACs Patient triggered events were associated with ectopy  Stress testing:    Advanced imaging:   Cardiac catherization    EKG:   EKG Interpretation Date/Time:  Monday July 27 2023 14:47:53 EDT Ventricular Rate:  60 PR Interval:  122 QRS Duration:  72 QT Interval:  402 QTC Calculation: 402 R Axis:   69  Text Interpretation: Normal sinus rhythm Nonspecific T wave abnormality When compared with ECG of 15-Jun-2023 15:31, Premature atrial complexes are no longer Present Confirmed by York Pellant [phone]) on  07/27/2023 2:53:43 PM     Physical Exam:    VS:  BP (!) 142/80   Pulse 60   Ht 5\' 1"  (1.549 m)   Wt 202 lb 9.6 oz (91.9 kg)   SpO2 98%   BMI 38.28 kg/m     Wt Readings from Last 3 Encounters:  07/27/23 202 lb 9.6 oz (91.9 kg)  06/15/23 199 lb (90.3 kg)  02/19/23 201 lb 9.6 oz (91.4 kg)     GEN: Well nourished, well developed in no acute distress CARDIAC: RRR, no murmurs, rubs, gallops RESPIRATORY:  Normal work of breathing MUSCULOSKELETAL: no edema    ASSESSMENT & PLAN:    Atrial fibrillation She has only had 1 documented episode in the setting of shoulder pain I recommend that she obtain a wearable device such as a Fitbit or Apple Watch to monitor for atrial fibrillation She could also use a blood pressure cuff or Kardia mobile to check her rate and rhythm daily I have asked her to call us if she has an increase in heart rate  Secondary hypercoagulable state Continue apixaban     Signed, Maurice Small, MD  07/27/2023 2:57 PM    [facility]

## 2023-07-29 DIAGNOSIS — E119 Type 2 diabetes mellitus without complications: Secondary | ICD-10-CM | POA: Diagnosis not present

## 2023-09-02 DIAGNOSIS — H401123 Primary open-angle glaucoma, left eye, severe stage: Secondary | ICD-10-CM | POA: Diagnosis not present

## 2023-09-02 DIAGNOSIS — H25811 Combined forms of age-related cataract, right eye: Secondary | ICD-10-CM | POA: Diagnosis not present

## 2023-09-02 DIAGNOSIS — H43813 Vitreous degeneration, bilateral: Secondary | ICD-10-CM | POA: Diagnosis not present

## 2023-09-02 DIAGNOSIS — H401112 Primary open-angle glaucoma, right eye, moderate stage: Secondary | ICD-10-CM | POA: Diagnosis not present

## 2023-09-09 ENCOUNTER — Encounter: Payer: Self-pay | Admitting: Neurology

## 2023-09-09 ENCOUNTER — Ambulatory Visit: Payer: BLUE CROSS/BLUE SHIELD | Admitting: Neurology

## 2023-09-16 DIAGNOSIS — H43813 Vitreous degeneration, bilateral: Secondary | ICD-10-CM | POA: Diagnosis not present

## 2023-09-16 DIAGNOSIS — H401112 Primary open-angle glaucoma, right eye, moderate stage: Secondary | ICD-10-CM | POA: Diagnosis not present

## 2023-09-16 DIAGNOSIS — H25811 Combined forms of age-related cataract, right eye: Secondary | ICD-10-CM | POA: Diagnosis not present

## 2023-09-16 DIAGNOSIS — H401123 Primary open-angle glaucoma, left eye, severe stage: Secondary | ICD-10-CM | POA: Diagnosis not present

## 2023-10-06 DIAGNOSIS — Z8542 Personal history of malignant neoplasm of other parts of uterus: Secondary | ICD-10-CM | POA: Diagnosis not present

## 2023-10-06 DIAGNOSIS — Z1211 Encounter for screening for malignant neoplasm of colon: Secondary | ICD-10-CM | POA: Diagnosis not present

## 2023-10-06 DIAGNOSIS — Z13820 Encounter for screening for osteoporosis: Secondary | ICD-10-CM | POA: Diagnosis not present

## 2023-10-06 DIAGNOSIS — Z139 Encounter for screening, unspecified: Secondary | ICD-10-CM | POA: Diagnosis not present

## 2023-10-06 DIAGNOSIS — Z01419 Encounter for gynecological examination (general) (routine) without abnormal findings: Secondary | ICD-10-CM | POA: Diagnosis not present

## 2023-10-06 DIAGNOSIS — Z6839 Body mass index (BMI) 39.0-39.9, adult: Secondary | ICD-10-CM | POA: Diagnosis not present

## 2023-10-06 DIAGNOSIS — Z1239 Encounter for other screening for malignant neoplasm of breast: Secondary | ICD-10-CM | POA: Diagnosis not present

## 2023-10-14 DIAGNOSIS — H401123 Primary open-angle glaucoma, left eye, severe stage: Secondary | ICD-10-CM | POA: Diagnosis not present

## 2023-10-14 DIAGNOSIS — H25811 Combined forms of age-related cataract, right eye: Secondary | ICD-10-CM | POA: Diagnosis not present

## 2023-10-14 DIAGNOSIS — H401112 Primary open-angle glaucoma, right eye, moderate stage: Secondary | ICD-10-CM | POA: Diagnosis not present

## 2023-10-14 DIAGNOSIS — H43813 Vitreous degeneration, bilateral: Secondary | ICD-10-CM | POA: Diagnosis not present

## 2023-12-03 DIAGNOSIS — Z23 Encounter for immunization: Secondary | ICD-10-CM | POA: Diagnosis not present

## 2023-12-03 DIAGNOSIS — E113293 Type 2 diabetes mellitus with mild nonproliferative diabetic retinopathy without macular edema, bilateral: Secondary | ICD-10-CM | POA: Diagnosis not present

## 2023-12-03 DIAGNOSIS — I10 Essential (primary) hypertension: Secondary | ICD-10-CM | POA: Diagnosis not present

## 2023-12-03 DIAGNOSIS — Z1331 Encounter for screening for depression: Secondary | ICD-10-CM | POA: Diagnosis not present

## 2023-12-03 DIAGNOSIS — D6859 Other primary thrombophilia: Secondary | ICD-10-CM | POA: Diagnosis not present

## 2023-12-03 DIAGNOSIS — R600 Localized edema: Secondary | ICD-10-CM | POA: Diagnosis not present

## 2023-12-03 DIAGNOSIS — Z79899 Other long term (current) drug therapy: Secondary | ICD-10-CM | POA: Diagnosis not present

## 2023-12-03 DIAGNOSIS — Z0001 Encounter for general adult medical examination with abnormal findings: Secondary | ICD-10-CM | POA: Diagnosis not present

## 2023-12-03 DIAGNOSIS — E7800 Pure hypercholesterolemia, unspecified: Secondary | ICD-10-CM | POA: Diagnosis not present

## 2023-12-16 ENCOUNTER — Other Ambulatory Visit (HOSPITAL_COMMUNITY): Payer: Self-pay | Admitting: Physician Assistant

## 2023-12-16 DIAGNOSIS — H401123 Primary open-angle glaucoma, left eye, severe stage: Secondary | ICD-10-CM | POA: Diagnosis not present

## 2023-12-16 DIAGNOSIS — H43813 Vitreous degeneration, bilateral: Secondary | ICD-10-CM | POA: Diagnosis not present

## 2023-12-16 DIAGNOSIS — H401112 Primary open-angle glaucoma, right eye, moderate stage: Secondary | ICD-10-CM | POA: Diagnosis not present

## 2023-12-16 DIAGNOSIS — H25811 Combined forms of age-related cataract, right eye: Secondary | ICD-10-CM | POA: Diagnosis not present

## 2024-01-28 ENCOUNTER — Encounter (HOSPITAL_COMMUNITY): Payer: Self-pay | Admitting: Physician Assistant

## 2024-01-28 ENCOUNTER — Ambulatory Visit (HOSPITAL_COMMUNITY)
Admission: RE | Admit: 2024-01-28 | Discharge: 2024-01-28 | Disposition: A | Discharge status: Home / Self Care | Payer: Medicare Other | Source: Ambulatory Visit | Attending: Physician Assistant | Admitting: Physician Assistant

## 2024-01-28 VITALS — BP 144/82 | HR 56 | Ht 61.0 in | Wt 207.4 lb

## 2024-01-28 DIAGNOSIS — Z79899 Other long term (current) drug therapy: Secondary | ICD-10-CM | POA: Diagnosis not present

## 2024-01-28 DIAGNOSIS — I480 Paroxysmal atrial fibrillation: Secondary | ICD-10-CM | POA: Diagnosis not present

## 2024-01-28 DIAGNOSIS — G4733 Obstructive sleep apnea (adult) (pediatric): Secondary | ICD-10-CM | POA: Insufficient documentation

## 2024-01-28 DIAGNOSIS — E119 Type 2 diabetes mellitus without complications: Secondary | ICD-10-CM | POA: Insufficient documentation

## 2024-01-28 DIAGNOSIS — E785 Hyperlipidemia, unspecified: Secondary | ICD-10-CM | POA: Insufficient documentation

## 2024-01-28 DIAGNOSIS — Z7901 Long term (current) use of anticoagulants: Secondary | ICD-10-CM | POA: Insufficient documentation

## 2024-01-28 DIAGNOSIS — E669 Obesity, unspecified: Secondary | ICD-10-CM | POA: Insufficient documentation

## 2024-01-28 DIAGNOSIS — D6869 Other thrombophilia: Secondary | ICD-10-CM | POA: Diagnosis not present

## 2024-01-28 DIAGNOSIS — Z6839 Body mass index (BMI) 39.0-39.9, adult: Secondary | ICD-10-CM | POA: Insufficient documentation

## 2024-01-28 DIAGNOSIS — I10 Essential (primary) hypertension: Secondary | ICD-10-CM | POA: Diagnosis not present

## 2024-01-28 DIAGNOSIS — Z8542 Personal history of malignant neoplasm of other parts of uterus: Secondary | ICD-10-CM | POA: Diagnosis not present

## 2024-01-28 NOTE — Progress Notes (Signed)
Primary Care Physician: Darrow Bussing, MD Primary Cardiologist: None Electrophysiologist: Maurice Small, MD  Referring Physician: Dr Windy Canny is a 89 y.o. female with a history of hypertension, hyperlipidemia, obesity, DM type II, endometrial cancer, OSA, atrial fibrillation who presents for follow up in the [facility]. The patient was initially diagnosed with atrial fibrillation 02/15/23 after presenting to the ED with symptoms of right shoulder pain. She was found to be in afib with rapid rates. She converted to SR with IV diltiazem. Patient is on Eliquis for stroke prevention. She was discharged with oral metoprolol.    She wore a cardiac monitor which showed no afib or sustained arrhythmias.   Patient presents today for follow up for atrial fibrillation. She reports that she has done very well since her last visit with no interim symptoms of afib. She is in SR today. No bleeding issues on anticoagulation.   Today, she denies symptoms of palpitations, chest pain, shortness of breath, orthopnea, PND, lower extremity edema, dizziness, presyncope, syncope, bleeding, or neurologic sequela. The patient is tolerating medications without difficulties and is otherwise without complaint today.    Atrial Fibrillation Risk Factors:  she does have symptoms or diagnosis of sleep apnea. she does not have a history of rheumatic fever. she does not have a history of alcohol use. The patient does not have a history of early familial atrial fibrillation or other arrhythmias.  Atrial Fibrillation Management history:  Previous antiarrhythmic drugs: none Previous cardioversions: none Previous ablations: none Anticoagulation history: Eliquis  ROS- All systems are reviewed and negative except as per the HPI above.  Past Medical History:  Diagnosis Date   Arthritis    Cancer [facility])    endometrial cancer   Cellulitis of left lower extremity     Diabetes mellitus    Hyperlipemia    Hypertension    Non-healing ulcer of lower leg (HCC)    Left leg     OAB (overactive bladder)    Obesity    BMI 45    Current Outpatient Medications  Medication Sig Dispense Refill   apixaban (ELIQUIS) 5 MG TABS tablet Take 1 tablet (5 mg total) by mouth 2 (two) times daily. 180 tablet 2   atorvastatin (LIPITOR) 20 MG tablet Take 20 mg by mouth daily.       brimonidine-timolol (COMBIGAN) 0.2-0.5 % ophthalmic solution Place 1 drop into both eyes every 12 (twelve) hours.     latanoprost (XALATAN) 0.005 % ophthalmic solution Place 1 drop into both eyes at bedtime.      metoprolol tartrate (LOPRESSOR) 25 MG tablet TAKE 0.5 TABLETS BY MOUTH 2 TIMES DAILY. 90 tablet 2   spironolactone (ALDACTONE) 25 MG tablet Take 1 tablet (25 mg total) by mouth daily. 30 tablet 1   telmisartan (MICARDIS) 80 MG tablet Take 80 mg by mouth daily.  3   tolterodine (DETROL LA) 4 MG 24 hr capsule Take 4 mg by mouth daily.  4   No current facility-administered medications for this encounter.    Physical Exam: BP (!) 144/82   Pulse (!) 56   Ht 5\' 1"  (1.549 m)   Wt 94.1 kg   BMI 39.19 kg/m   GEN: Well nourished, well developed in no acute distress CARDIAC: Regular rate and rhythm, no murmurs, rubs, gallops RESPIRATORY:  Clear to auscultation without rales, wheezing or rhonchi  ABDOMEN: Soft, non-tender, non-distended EXTREMITIES:  No edema; No deformity   Wt Readings from  Last 3 Encounters:  01/28/24 94.1 kg  07/27/23 91.9 kg  06/15/23 90.3 kg     EKG today demonstrates  SB, T wave changes similar to ECG 01/2023 Vent. rate 56 BPM PR interval 128 ms QRS duration 78 ms QT/QTcB 432/416 ms   Echo 02/16/23 demonstrated   1. Left ventricular ejection fraction, by estimation, is 60 to 65%. The  left ventricle has normal function. The left ventricle has no regional  wall motion abnormalities. Left ventricular diastolic parameters are  consistent with Grade III  diastolic dysfunction (restrictive).   2. Right ventricular systolic function is normal. The right ventricular  size is normal. There is normal pulmonary artery systolic pressure.   3. Left atrial size was moderately dilated.   4. Right atrial size was mildly dilated.   5. The mitral valve is normal in structure. Mild to moderate mitral valve  regurgitation. No evidence of mitral stenosis.   6. The aortic valve is tricuspid. There is mild calcification of the  aortic valve. Aortic valve regurgitation is mild to moderate. Aortic valve  sclerosis/calcification is present, without any evidence of aortic  stenosis.   7. The inferior vena cava is normal in size with greater than 50%  respiratory variability, suggesting right atrial pressure of 3 mmHg.    CHA2DS2-VASc Score = 5  The patient's score is based upon: CHF History: 0 HTN History: 1 Diabetes History: 1 Stroke History: 0 Vascular Disease History: 0 Age Score: 2 Gender Score: 1       ASSESSMENT AND PLAN: Paroxysmal Atrial Fibrillation (ICD10:  I48.0) The patient's CHA2DS2-VASc score is 5, indicating a 7.2% annual risk of stroke.   Patient appears to be maintaining SR Continue Lopressor 12.5 mg BID Continue Eliquis 5 mg BID  Secondary Hypercoagulable State (ICD10:  D68.69) The patient is at significant risk for stroke/thromboembolism based upon her CHA2DS2-VASc Score of 5.  Continue Apixaban (Eliquis).   OSA  Sleep study in 2018 showed mild OSA She is not currently on CPAP  HTN Stable on current regimen  Obesity Body mass index is 39.19 kg/m.  Encouraged lifestyle modification    Follow up in the AF clinic in one year.       Jorja Loa PA-C Afib Clinic [facility] [address] [place], [place] [address] [phone]

## 2024-02-02 DIAGNOSIS — D6859 Other primary thrombophilia: Secondary | ICD-10-CM | POA: Diagnosis not present

## 2024-02-02 DIAGNOSIS — I10 Essential (primary) hypertension: Secondary | ICD-10-CM | POA: Diagnosis not present

## 2024-02-02 DIAGNOSIS — Z79899 Other long term (current) drug therapy: Secondary | ICD-10-CM | POA: Diagnosis not present

## 2024-02-02 DIAGNOSIS — E113293 Type 2 diabetes mellitus with mild nonproliferative diabetic retinopathy without macular edema, bilateral: Secondary | ICD-10-CM | POA: Diagnosis not present

## 2024-03-28 DIAGNOSIS — I480 Paroxysmal atrial fibrillation: Secondary | ICD-10-CM | POA: Diagnosis not present

## 2024-03-28 DIAGNOSIS — E7800 Pure hypercholesterolemia, unspecified: Secondary | ICD-10-CM | POA: Diagnosis not present

## 2024-03-28 DIAGNOSIS — I10 Essential (primary) hypertension: Secondary | ICD-10-CM | POA: Diagnosis not present

## 2024-03-28 DIAGNOSIS — E113293 Type 2 diabetes mellitus with mild nonproliferative diabetic retinopathy without macular edema, bilateral: Secondary | ICD-10-CM | POA: Diagnosis not present

## 2024-04-05 ENCOUNTER — Other Ambulatory Visit: Payer: Self-pay | Admitting: Family Medicine

## 2024-04-05 DIAGNOSIS — R600 Localized edema: Secondary | ICD-10-CM | POA: Diagnosis not present

## 2024-04-05 DIAGNOSIS — M79671 Pain in right foot: Secondary | ICD-10-CM

## 2024-04-05 DIAGNOSIS — M7989 Other specified soft tissue disorders: Secondary | ICD-10-CM

## 2024-04-05 DIAGNOSIS — I5032 Chronic diastolic (congestive) heart failure: Secondary | ICD-10-CM | POA: Diagnosis not present

## 2024-04-05 DIAGNOSIS — I480 Paroxysmal atrial fibrillation: Secondary | ICD-10-CM | POA: Diagnosis not present

## 2024-04-06 ENCOUNTER — Ambulatory Visit
Admission: RE | Admit: 2024-04-06 | Discharge: 2024-04-06 | Disposition: A | Discharge status: Home / Self Care | Source: Ambulatory Visit | Attending: Family Medicine | Admitting: Family Medicine

## 2024-04-06 DIAGNOSIS — M7989 Other specified soft tissue disorders: Secondary | ICD-10-CM

## 2024-04-06 DIAGNOSIS — M79604 Pain in right leg: Secondary | ICD-10-CM | POA: Diagnosis not present

## 2024-04-06 DIAGNOSIS — R600 Localized edema: Secondary | ICD-10-CM | POA: Diagnosis not present

## 2024-04-06 DIAGNOSIS — M79671 Pain in right foot: Secondary | ICD-10-CM

## 2024-04-27 DIAGNOSIS — E7800 Pure hypercholesterolemia, unspecified: Secondary | ICD-10-CM | POA: Diagnosis not present

## 2024-04-27 DIAGNOSIS — E113293 Type 2 diabetes mellitus with mild nonproliferative diabetic retinopathy without macular edema, bilateral: Secondary | ICD-10-CM | POA: Diagnosis not present

## 2024-04-27 DIAGNOSIS — I10 Essential (primary) hypertension: Secondary | ICD-10-CM | POA: Diagnosis not present

## 2024-04-27 DIAGNOSIS — I480 Paroxysmal atrial fibrillation: Secondary | ICD-10-CM | POA: Diagnosis not present

## 2024-05-28 DIAGNOSIS — E7800 Pure hypercholesterolemia, unspecified: Secondary | ICD-10-CM | POA: Diagnosis not present

## 2024-05-28 DIAGNOSIS — I10 Essential (primary) hypertension: Secondary | ICD-10-CM | POA: Diagnosis not present

## 2024-05-28 DIAGNOSIS — E113293 Type 2 diabetes mellitus with mild nonproliferative diabetic retinopathy without macular edema, bilateral: Secondary | ICD-10-CM | POA: Diagnosis not present

## 2024-05-28 DIAGNOSIS — I480 Paroxysmal atrial fibrillation: Secondary | ICD-10-CM | POA: Diagnosis not present

## 2024-06-06 ENCOUNTER — Emergency Department (HOSPITAL_COMMUNITY)
Admission: EM | Admit: 2024-06-06 | Discharge: 2024-06-07 | Disposition: A | Discharge status: Home / Self Care | Attending: Emergency Medicine | Admitting: Emergency Medicine

## 2024-06-06 DIAGNOSIS — I10 Essential (primary) hypertension: Secondary | ICD-10-CM | POA: Diagnosis not present

## 2024-06-06 DIAGNOSIS — S76012A Strain of muscle, fascia and tendon of left hip, initial encounter: Secondary | ICD-10-CM | POA: Diagnosis not present

## 2024-06-06 DIAGNOSIS — M48061 Spinal stenosis, lumbar region without neurogenic claudication: Secondary | ICD-10-CM | POA: Diagnosis not present

## 2024-06-06 DIAGNOSIS — S80919A Unspecified superficial injury of unspecified knee, initial encounter: Secondary | ICD-10-CM | POA: Diagnosis not present

## 2024-06-06 DIAGNOSIS — Z79899 Other long term (current) drug therapy: Secondary | ICD-10-CM | POA: Insufficient documentation

## 2024-06-06 DIAGNOSIS — Z8542 Personal history of malignant neoplasm of other parts of uterus: Secondary | ICD-10-CM | POA: Insufficient documentation

## 2024-06-06 DIAGNOSIS — M549 Dorsalgia, unspecified: Secondary | ICD-10-CM | POA: Diagnosis not present

## 2024-06-06 DIAGNOSIS — M24561 Contracture, right knee: Secondary | ICD-10-CM | POA: Diagnosis not present

## 2024-06-06 DIAGNOSIS — Z7901 Long term (current) use of anticoagulants: Secondary | ICD-10-CM | POA: Insufficient documentation

## 2024-06-06 DIAGNOSIS — E7800 Pure hypercholesterolemia, unspecified: Secondary | ICD-10-CM | POA: Diagnosis not present

## 2024-06-06 DIAGNOSIS — M25561 Pain in right knee: Secondary | ICD-10-CM | POA: Diagnosis not present

## 2024-06-06 DIAGNOSIS — M545 Low back pain, unspecified: Secondary | ICD-10-CM | POA: Diagnosis not present

## 2024-06-06 DIAGNOSIS — I480 Paroxysmal atrial fibrillation: Secondary | ICD-10-CM | POA: Diagnosis not present

## 2024-06-06 DIAGNOSIS — M24562 Contracture, left knee: Secondary | ICD-10-CM | POA: Diagnosis not present

## 2024-06-06 DIAGNOSIS — Y92009 Unspecified place in unspecified non-institutional (private) residence as the place of occurrence of the external cause: Secondary | ICD-10-CM | POA: Diagnosis not present

## 2024-06-06 DIAGNOSIS — E119 Type 2 diabetes mellitus without complications: Secondary | ICD-10-CM | POA: Diagnosis not present

## 2024-06-06 DIAGNOSIS — W19XXXA Unspecified fall, initial encounter: Secondary | ICD-10-CM | POA: Diagnosis not present

## 2024-06-06 DIAGNOSIS — W1839XA Other fall on same level, initial encounter: Secondary | ICD-10-CM | POA: Insufficient documentation

## 2024-06-06 DIAGNOSIS — S76011A Strain of muscle, fascia and tendon of right hip, initial encounter: Secondary | ICD-10-CM | POA: Diagnosis not present

## 2024-06-06 DIAGNOSIS — E113293 Type 2 diabetes mellitus with mild nonproliferative diabetic retinopathy without macular edema, bilateral: Secondary | ICD-10-CM | POA: Diagnosis not present

## 2024-06-06 DIAGNOSIS — M25562 Pain in left knee: Secondary | ICD-10-CM | POA: Diagnosis not present

## 2024-06-06 DIAGNOSIS — S39012A Strain of muscle, fascia and tendon of lower back, initial encounter: Secondary | ICD-10-CM | POA: Diagnosis not present

## 2024-06-06 NOTE — ED Triage Notes (Signed)
 Patient arrived via [facility] after a mechanical fall from standing to her knees. Complaints of right knee, and left hip/back pain. Did not hit her head, no loc, able to bear weight to stand. On blood thinners.

## 2024-06-07 ENCOUNTER — Emergency Department (HOSPITAL_COMMUNITY)

## 2024-06-07 DIAGNOSIS — M545 Low back pain, unspecified: Secondary | ICD-10-CM | POA: Diagnosis not present

## 2024-06-07 DIAGNOSIS — M48061 Spinal stenosis, lumbar region without neurogenic claudication: Secondary | ICD-10-CM | POA: Diagnosis not present

## 2024-06-07 DIAGNOSIS — M25562 Pain in left knee: Secondary | ICD-10-CM | POA: Diagnosis not present

## 2024-06-07 DIAGNOSIS — S76012A Strain of muscle, fascia and tendon of left hip, initial encounter: Secondary | ICD-10-CM | POA: Diagnosis not present

## 2024-06-07 DIAGNOSIS — M25561 Pain in right knee: Secondary | ICD-10-CM | POA: Diagnosis not present

## 2024-06-07 MED ORDER — ACETAMINOPHEN 500 MG PO TABS
1000.0000 mg | ORAL_TABLET | Freq: Once | ORAL | Status: AC
  Administered 2024-06-07: 1000 mg via ORAL
  Filled 2024-06-07: qty 2

## 2024-06-07 NOTE — ED Provider Notes (Signed)
 [place] AT [facility] Provider Note  CSN: [id number] Arrival date & time: 06/06/24 2341  Chief Complaint(s) Fall  HPI Jenna [name] is a 89 y.o. female with a past medical history listed below here for bilateral knee pain and left hip pain following mechanical fall at home.  Patient reports trying to put on a dresser for her head when she lost her balance causing her to fall forward and landing on her knees.  No significant head trauma or loss of consciousness.  Left hip pain began to hurt "a while" after the fall.  The history is provided by the patient.    Past Medical History Past Medical History:  Diagnosis Date   Arthritis    Cancer (HCC)    endometrial cancer   Cellulitis of left lower extremity    Diabetes mellitus    Hyperlipemia    Hypertension    Non-healing ulcer of lower leg (HCC)    Left leg     OAB (overactive bladder)    Obesity    BMI 45   Patient Active Problem List   Diagnosis Date Noted   Hypercoagulable state due to paroxysmal atrial fibrillation (HCC) 02/19/2023   Paroxysmal A-fib (HCC) 02/15/2023   Elevated troponin 02/15/2023   Hypocalcemia 02/15/2023   History of cellulitis 02/15/2023   Obesity (BMI 30-39.9) 02/15/2023   OSA (obstructive sleep apnea) 02/08/2017   Murmur, cardiac 12/31/2016   Morbid obesity (HCC) 12/31/2016   Cellulitis of left leg 07/07/2016   Venous stasis ulcer of left lower extremity (HCC) 07/07/2016   Chronic cholecystitis with calculus s/p lap chole 07/04/2014 07/03/2014   HTN (hypertension) 05/31/2012   Non-insulin dependent type 2 diabetes mellitus (HCC) 05/31/2012   Hypercholesteremia 05/31/2012   OAB (overactive bladder)    Malignant neoplasm of corpus uteri, except isthmus (HCC) 12/10/2011    Class: Stage 1   Home Medication(s) Prior to Admission medications   Medication Sig Start Date End Date Taking? Authorizing Provider  apixaban  (ELIQUIS ) 5 MG TABS tablet Take 1 tablet (5  mg total) by mouth 2 (two) times daily. 02/19/23   Fenton, Clint R, PA  atorvastatin  (LIPITOR) 20 MG tablet Take 20 mg by mouth daily.      [provider]  brimonidine -timolol  (COMBIGAN ) 0.2-0.5 % ophthalmic solution Place 1 drop into both eyes every 12 (twelve) hours.    [provider]  latanoprost  (XALATAN ) 0.005 % ophthalmic solution Place 1 drop into both eyes at bedtime.     [provider]  metoprolol  tartrate (LOPRESSOR ) 25 MG tablet TAKE 0.5 TABLETS BY MOUTH 2 TIMES DAILY. 12/16/23   Fenton, Clint R, PA  spironolactone  (ALDACTONE ) 25 MG tablet Take 1 tablet (25 mg total) by mouth daily. 02/17/23   Wouk, Haynes Lips, MD  telmisartan (MICARDIS) 80 MG tablet Take 80 mg by mouth daily. 04/19/18   [provider]  tolterodine (DETROL LA) 4 MG 24 hr capsule Take 4 mg by mouth daily. 05/12/16   [provider]  Allergies Shrimp [shellfish allergy], Ancef [cefazolin], and Benazepril  Review of Systems Review of Systems As noted in HPI  Physical Exam Vital Signs  I have reviewed the triage vital signs BP (!) 162/90   Pulse (!) 52   Temp 97.9 F (36.6 C) (Oral)   Resp 16   SpO2 100%   Physical Exam Constitutional:      General: She is not in acute distress.    Appearance: She is well-developed. She is obese. She is not diaphoretic.  HENT:     Head: Normocephalic and atraumatic.     Right Ear: External ear normal.     Left Ear: External ear normal.     Nose: Nose normal.  Eyes:     General: No scleral icterus.       Right eye: No discharge.        Left eye: No discharge.     Conjunctiva/sclera: Conjunctivae normal.     Pupils: Pupils are equal, round, and reactive to light.  Cardiovascular:     Rate and Rhythm: Normal rate and regular rhythm.     Pulses:          Radial pulses are 2+ on the right side and  2+ on the left side.       Dorsalis pedis pulses are 2+ on the right side and 2+ on the left side.     Heart sounds: Normal heart sounds. No murmur heard.    No friction rub. No gallop.  Pulmonary:     Effort: Pulmonary effort is normal. No respiratory distress.     Breath sounds: Normal breath sounds. No stridor. No wheezing.  Abdominal:     General: There is no distension.     Palpations: Abdomen is soft.     Tenderness: There is no abdominal tenderness.  Musculoskeletal:     Cervical back: Normal range of motion and neck supple. No bony tenderness. No spinous process tenderness or muscular tenderness.     Thoracic back: No bony tenderness.     Lumbar back: No bony tenderness.     Left hip: Tenderness present. No deformity.     Right knee: No swelling or deformity. Tenderness present.     Left knee: No swelling or deformity. Tenderness present.       Legs:     Comments: Clavicles stable. Chest stable to AP/Lat compression. Pelvis stable to Lat compression. No obvious extremity deformity. No chest or abdominal wall contusion.  Skin:    General: Skin is warm and dry.     Findings: No erythema or rash.  Neurological:     Mental Status: She is alert and oriented to person, place, and time.     Comments: Moving all extremities     ED Results and Treatments Labs (all labs ordered are listed, but only abnormal results are displayed) Labs Reviewed - No data to display  EKG  EKG Interpretation Date/Time:    Ventricular Rate:    PR Interval:    QRS Duration:    QT Interval:    QTC Calculation:   R Axis:      Text Interpretation:         Radiology DG Knee AP/LAT W/Sunrise Left Result Date: 06/07/2024 CLINICAL DATA:  Fall injury with low back pain, left hip pain, bilateral knee pain. EXAM: LEFT KNEE 3 VIEWS; LUMBAR SPINE - 2-3 VIEW; RIGHT KNEE 3 VIEWS; DG  HIP (WITH OR WITHOUT PELVIS) 2-3V LEFT COMPARISON:  AP pelvis and AP right hip 05/16/2015, CTA abdomen pelvis and reconstructions 06/20/2014, right knee series 09/24/2019, left tibia fibula series 09/05/2011. FINDINGS: AP, lateral and lumbosacral spot lateral lumbar spine three views: There are 5 lumbar type segments. Osteopenia. No evidence of fractures. There is bulky lumbar marginal osteophytosis, with osteophytic bridging at L2-3 and L5-S1. This is increased from 10 years ago. Alignment is unchanged. Alignment is normal except for grade 1 degenerative anterolisthesis at L3-4 and L4-5. There is disc space loss and vacuum phenomenon from L2-3 down, moderate disc narrowing L1-2. Facet hypertrophy noted moderately at the lowest 3 levels. No focal pathologic process is seen. The SI joints unremarkable, as visualized, except for spurring changes. AP pelvis and AP and frog-leg left hip, three views: There is no evidence of pelvic fracture or diastasis. There is mild osteopenia without evidence of left hip fracture or dislocation. There is mild degenerative arthrosis at the hips and SI joints, mild pelvic enthesopathy. No focal pathologic bone lesion is seen. Right knee AP, lateral and sunrise three views: There is no evidence of fracture, dislocation or joint effusion. There is tricompartmental arthrosis greatest in the femorotibial joint with a greater degree of joint space loss and marginal osteophytosis than in the patellofemoral joint. There is increasing bulky femorotibial marginal osteophytosis. There are enthesopathic changes of the anterior patella and pretibial phleboliths. There is artifact from overlying clothing. Left knee AP, lateral and sunrise three views: Osteopenia. Total joint arthroplasty without evidence of significant loosening. There is no evidence of fractures or dislocation. No suprapatellar bursal effusion is seen. Left knee replacement hardware similar in appearance to 2012. There are multiple  pretibial phleboliths. Otherwise unremarkable superficial soft tissues. IMPRESSION: 1. Osteopenia and degenerative change without evidence of lumbar fractures. 2. No evidence of pelvic fracture or diastasis. 3. No evidence of left hip fracture or dislocation. 4. Tricompartmental arthrosis of the right knee greatest in the femorotibial joint, with increasing bulky femorotibial marginal osteophytosis since 2020. 5. Left knee replacement hardware without evidence of fractures or dislocation. Electronically Signed   By: Denman Fischer M.D.   On: 06/07/2024 03:25   DG Knee AP/LAT W/Sunrise Right Result Date: 06/07/2024 CLINICAL DATA:  Fall injury with low back pain, left hip pain, bilateral knee pain. EXAM: LEFT KNEE 3 VIEWS; LUMBAR SPINE - 2-3 VIEW; RIGHT KNEE 3 VIEWS; DG HIP (WITH OR WITHOUT PELVIS) 2-3V LEFT COMPARISON:  AP pelvis and AP right hip 05/16/2015, CTA abdomen pelvis and reconstructions 06/20/2014, right knee series 09/24/2019, left tibia fibula series 09/05/2011. FINDINGS: AP, lateral and lumbosacral spot lateral lumbar spine three views: There are 5 lumbar type segments. Osteopenia. No evidence of fractures. There is bulky lumbar marginal osteophytosis, with osteophytic bridging at L2-3 and L5-S1. This is increased from 10 years ago. Alignment is unchanged. Alignment is normal except for grade 1 degenerative anterolisthesis at L3-4 and L4-5. There is disc space loss and vacuum phenomenon from L2-3  down, moderate disc narrowing L1-2. Facet hypertrophy noted moderately at the lowest 3 levels. No focal pathologic process is seen. The SI joints unremarkable, as visualized, except for spurring changes. AP pelvis and AP and frog-leg left hip, three views: There is no evidence of pelvic fracture or diastasis. There is mild osteopenia without evidence of left hip fracture or dislocation. There is mild degenerative arthrosis at the hips and SI joints, mild pelvic enthesopathy. No focal pathologic bone lesion is  seen. Right knee AP, lateral and sunrise three views: There is no evidence of fracture, dislocation or joint effusion. There is tricompartmental arthrosis greatest in the femorotibial joint with a greater degree of joint space loss and marginal osteophytosis than in the patellofemoral joint. There is increasing bulky femorotibial marginal osteophytosis. There are enthesopathic changes of the anterior patella and pretibial phleboliths. There is artifact from overlying clothing. Left knee AP, lateral and sunrise three views: Osteopenia. Total joint arthroplasty without evidence of significant loosening. There is no evidence of fractures or dislocation. No suprapatellar bursal effusion is seen. Left knee replacement hardware similar in appearance to 2012. There are multiple pretibial phleboliths. Otherwise unremarkable superficial soft tissues. IMPRESSION: 1. Osteopenia and degenerative change without evidence of lumbar fractures. 2. No evidence of pelvic fracture or diastasis. 3. No evidence of left hip fracture or dislocation. 4. Tricompartmental arthrosis of the right knee greatest in the femorotibial joint, with increasing bulky femorotibial marginal osteophytosis since 2020. 5. Left knee replacement hardware without evidence of fractures or dislocation. Electronically Signed   By: Denman Fischer M.D.   On: 06/07/2024 03:25   DG HIP UNILAT W OR W/O PELVIS 2-3 VIEWS LEFT Result Date: 06/07/2024 CLINICAL DATA:  Fall injury with low back pain, left hip pain, bilateral knee pain. EXAM: LEFT KNEE 3 VIEWS; LUMBAR SPINE - 2-3 VIEW; RIGHT KNEE 3 VIEWS; DG HIP (WITH OR WITHOUT PELVIS) 2-3V LEFT COMPARISON:  AP pelvis and AP right hip 05/16/2015, CTA abdomen pelvis and reconstructions 06/20/2014, right knee series 09/24/2019, left tibia fibula series 09/05/2011. FINDINGS: AP, lateral and lumbosacral spot lateral lumbar spine three views: There are 5 lumbar type segments. Osteopenia. No evidence of fractures. There is  bulky lumbar marginal osteophytosis, with osteophytic bridging at L2-3 and L5-S1. This is increased from 10 years ago. Alignment is unchanged. Alignment is normal except for grade 1 degenerative anterolisthesis at L3-4 and L4-5. There is disc space loss and vacuum phenomenon from L2-3 down, moderate disc narrowing L1-2. Facet hypertrophy noted moderately at the lowest 3 levels. No focal pathologic process is seen. The SI joints unremarkable, as visualized, except for spurring changes. AP pelvis and AP and frog-leg left hip, three views: There is no evidence of pelvic fracture or diastasis. There is mild osteopenia without evidence of left hip fracture or dislocation. There is mild degenerative arthrosis at the hips and SI joints, mild pelvic enthesopathy. No focal pathologic bone lesion is seen. Right knee AP, lateral and sunrise three views: There is no evidence of fracture, dislocation or joint effusion. There is tricompartmental arthrosis greatest in the femorotibial joint with a greater degree of joint space loss and marginal osteophytosis than in the patellofemoral joint. There is increasing bulky femorotibial marginal osteophytosis. There are enthesopathic changes of the anterior patella and pretibial phleboliths. There is artifact from overlying clothing. Left knee AP, lateral and sunrise three views: Osteopenia. Total joint arthroplasty without evidence of significant loosening. There is no evidence of fractures or dislocation. No suprapatellar bursal effusion is seen. Left knee  replacement hardware similar in appearance to 2012. There are multiple pretibial phleboliths. Otherwise unremarkable superficial soft tissues. IMPRESSION: 1. Osteopenia and degenerative change without evidence of lumbar fractures. 2. No evidence of pelvic fracture or diastasis. 3. No evidence of left hip fracture or dislocation. 4. Tricompartmental arthrosis of the right knee greatest in the femorotibial joint, with increasing bulky  femorotibial marginal osteophytosis since 2020. 5. Left knee replacement hardware without evidence of fractures or dislocation. Electronically Signed   By: Denman Fischer M.D.   On: 06/07/2024 03:25   DG Lumbar Spine 2-3 Views Result Date: 06/07/2024 CLINICAL DATA:  Fall injury with low back pain, left hip pain, bilateral knee pain. EXAM: LEFT KNEE 3 VIEWS; LUMBAR SPINE - 2-3 VIEW; RIGHT KNEE 3 VIEWS; DG HIP (WITH OR WITHOUT PELVIS) 2-3V LEFT COMPARISON:  AP pelvis and AP right hip 05/16/2015, CTA abdomen pelvis and reconstructions 06/20/2014, right knee series 09/24/2019, left tibia fibula series 09/05/2011. FINDINGS: AP, lateral and lumbosacral spot lateral lumbar spine three views: There are 5 lumbar type segments. Osteopenia. No evidence of fractures. There is bulky lumbar marginal osteophytosis, with osteophytic bridging at L2-3 and L5-S1. This is increased from 10 years ago. Alignment is unchanged. Alignment is normal except for grade 1 degenerative anterolisthesis at L3-4 and L4-5. There is disc space loss and vacuum phenomenon from L2-3 down, moderate disc narrowing L1-2. Facet hypertrophy noted moderately at the lowest 3 levels. No focal pathologic process is seen. The SI joints unremarkable, as visualized, except for spurring changes. AP pelvis and AP and frog-leg left hip, three views: There is no evidence of pelvic fracture or diastasis. There is mild osteopenia without evidence of left hip fracture or dislocation. There is mild degenerative arthrosis at the hips and SI joints, mild pelvic enthesopathy. No focal pathologic bone lesion is seen. Right knee AP, lateral and sunrise three views: There is no evidence of fracture, dislocation or joint effusion. There is tricompartmental arthrosis greatest in the femorotibial joint with a greater degree of joint space loss and marginal osteophytosis than in the patellofemoral joint. There is increasing bulky femorotibial marginal osteophytosis. There are  enthesopathic changes of the anterior patella and pretibial phleboliths. There is artifact from overlying clothing. Left knee AP, lateral and sunrise three views: Osteopenia. Total joint arthroplasty without evidence of significant loosening. There is no evidence of fractures or dislocation. No suprapatellar bursal effusion is seen. Left knee replacement hardware similar in appearance to 2012. There are multiple pretibial phleboliths. Otherwise unremarkable superficial soft tissues. IMPRESSION: 1. Osteopenia and degenerative change without evidence of lumbar fractures. 2. No evidence of pelvic fracture or diastasis. 3. No evidence of left hip fracture or dislocation. 4. Tricompartmental arthrosis of the right knee greatest in the femorotibial joint, with increasing bulky femorotibial marginal osteophytosis since 2020. 5. Left knee replacement hardware without evidence of fractures or dislocation. Electronically Signed   By: Denman Fischer M.D.   On: 06/07/2024 03:25    Medications Ordered in ED Medications  acetaminophen  (TYLENOL ) tablet 1,000 mg (1,000 mg Oral Given 06/07/24 0054)   Procedures Procedures  (including critical care time) Medical Decision Making / ED Course   Medical Decision Making Amount and/or Complexity of Data Reviewed Radiology: ordered.  Risk OTC drugs.    Mechanical fall resulting in bilateral knee and left hip pain. Patient is anticoagulated but denies any head trauma.  Denies any headache concerning for ICH. No neck pain or tenderness requiring CT. Plain films of bilateral knees, left hip and lumbar spine  negative for any acute fractures.  Pain resolved with oral Tylenol .  Able to ambulate without complication.     Final Clinical Impression(s) / ED Diagnoses Final diagnoses:  Fall in home, initial encounter  Contractures of both knees  Hip strain, left, initial encounter   The patient appears reasonably screened and/or stabilized for discharge and I doubt  any other medical condition or other [facility] requiring further screening, evaluation, or treatment in the ED at this time. I have discussed the findings, Dx and Tx plan with the patient/family who expressed understanding and agree(s) with the plan. Discharge instructions discussed at length. The patient/family was given strict return precautions who verbalized understanding of the instructions. No further questions at time of discharge.  Disposition: Discharge  Condition: Good  ED Discharge Orders     None         Follow Up: Lanae Pinal, MD [address] [place] [address] [phone]  Call  to schedule an appointment for close follow up     This chart was dictated using voice recognition software.  Despite best efforts to proofread,  errors can occur which can change the documentation meaning.    Lindle Rhea, MD 06/07/24 [phone]

## 2024-06-07 NOTE — ED Notes (Signed)
 Pt able to ambulate with walker assist with minimal assistance from staff, pt maintains steady and equal gait.

## 2024-06-27 DIAGNOSIS — I480 Paroxysmal atrial fibrillation: Secondary | ICD-10-CM | POA: Diagnosis not present

## 2024-06-27 DIAGNOSIS — E7800 Pure hypercholesterolemia, unspecified: Secondary | ICD-10-CM | POA: Diagnosis not present

## 2024-06-27 DIAGNOSIS — E113293 Type 2 diabetes mellitus with mild nonproliferative diabetic retinopathy without macular edema, bilateral: Secondary | ICD-10-CM | POA: Diagnosis not present

## 2024-06-27 DIAGNOSIS — I10 Essential (primary) hypertension: Secondary | ICD-10-CM | POA: Diagnosis not present

## 2024-07-26 DIAGNOSIS — R32 Unspecified urinary incontinence: Secondary | ICD-10-CM | POA: Diagnosis not present

## 2024-07-26 DIAGNOSIS — N8110 Cystocele, unspecified: Secondary | ICD-10-CM | POA: Diagnosis not present

## 2024-07-28 DIAGNOSIS — I480 Paroxysmal atrial fibrillation: Secondary | ICD-10-CM | POA: Diagnosis not present

## 2024-07-28 DIAGNOSIS — E7800 Pure hypercholesterolemia, unspecified: Secondary | ICD-10-CM | POA: Diagnosis not present

## 2024-07-28 DIAGNOSIS — I10 Essential (primary) hypertension: Secondary | ICD-10-CM | POA: Diagnosis not present

## 2024-07-28 DIAGNOSIS — E113293 Type 2 diabetes mellitus with mild nonproliferative diabetic retinopathy without macular edema, bilateral: Secondary | ICD-10-CM | POA: Diagnosis not present

## 2024-08-02 DIAGNOSIS — R32 Unspecified urinary incontinence: Secondary | ICD-10-CM | POA: Diagnosis not present

## 2024-08-02 DIAGNOSIS — N8110 Cystocele, unspecified: Secondary | ICD-10-CM | POA: Diagnosis not present

## 2024-08-28 DIAGNOSIS — I480 Paroxysmal atrial fibrillation: Secondary | ICD-10-CM | POA: Diagnosis not present

## 2024-08-28 DIAGNOSIS — E7800 Pure hypercholesterolemia, unspecified: Secondary | ICD-10-CM | POA: Diagnosis not present

## 2024-08-28 DIAGNOSIS — I10 Essential (primary) hypertension: Secondary | ICD-10-CM | POA: Diagnosis not present

## 2024-08-28 DIAGNOSIS — E113293 Type 2 diabetes mellitus with mild nonproliferative diabetic retinopathy without macular edema, bilateral: Secondary | ICD-10-CM | POA: Diagnosis not present

## 2024-08-31 DIAGNOSIS — N8110 Cystocele, unspecified: Secondary | ICD-10-CM | POA: Diagnosis not present

## 2024-08-31 DIAGNOSIS — R300 Dysuria: Secondary | ICD-10-CM | POA: Diagnosis not present

## 2024-09-06 DIAGNOSIS — Z9181 History of falling: Secondary | ICD-10-CM | POA: Diagnosis not present

## 2024-09-06 DIAGNOSIS — I10 Essential (primary) hypertension: Secondary | ICD-10-CM | POA: Diagnosis not present

## 2024-09-06 DIAGNOSIS — R29898 Other symptoms and signs involving the musculoskeletal system: Secondary | ICD-10-CM | POA: Diagnosis not present

## 2024-09-06 DIAGNOSIS — E7800 Pure hypercholesterolemia, unspecified: Secondary | ICD-10-CM | POA: Diagnosis not present

## 2024-09-06 DIAGNOSIS — Z23 Encounter for immunization: Secondary | ICD-10-CM | POA: Diagnosis not present

## 2024-09-06 DIAGNOSIS — E113293 Type 2 diabetes mellitus with mild nonproliferative diabetic retinopathy without macular edema, bilateral: Secondary | ICD-10-CM | POA: Diagnosis not present

## 2024-09-19 ENCOUNTER — Other Ambulatory Visit (HOSPITAL_COMMUNITY): Payer: Self-pay | Admitting: *Deleted

## 2024-09-19 MED ORDER — METOPROLOL TARTRATE 25 MG PO TABS: 12.5000 mg | ORAL_TABLET | Freq: Two times a day (BID) | ORAL | 2 refills | Status: AC

## 2024-09-19 MED ORDER — APIXABAN 5 MG PO TABS: 5.0000 mg | ORAL_TABLET | Freq: Two times a day (BID) | ORAL | 2 refills | Status: AC

## 2024-09-22 DIAGNOSIS — H401112 Primary open-angle glaucoma, right eye, moderate stage: Secondary | ICD-10-CM | POA: Diagnosis not present

## 2024-09-22 DIAGNOSIS — H43813 Vitreous degeneration, bilateral: Secondary | ICD-10-CM | POA: Diagnosis not present

## 2024-09-22 DIAGNOSIS — H401123 Primary open-angle glaucoma, left eye, severe stage: Secondary | ICD-10-CM | POA: Diagnosis not present

## 2024-09-22 DIAGNOSIS — H25811 Combined forms of age-related cataract, right eye: Secondary | ICD-10-CM | POA: Diagnosis not present

## 2024-09-27 DIAGNOSIS — I480 Paroxysmal atrial fibrillation: Secondary | ICD-10-CM | POA: Diagnosis not present

## 2024-09-27 DIAGNOSIS — I10 Essential (primary) hypertension: Secondary | ICD-10-CM | POA: Diagnosis not present

## 2024-09-27 DIAGNOSIS — E113293 Type 2 diabetes mellitus with mild nonproliferative diabetic retinopathy without macular edema, bilateral: Secondary | ICD-10-CM | POA: Diagnosis not present

## 2024-09-27 DIAGNOSIS — E7800 Pure hypercholesterolemia, unspecified: Secondary | ICD-10-CM | POA: Diagnosis not present

## 2024-10-28 DIAGNOSIS — E7800 Pure hypercholesterolemia, unspecified: Secondary | ICD-10-CM | POA: Diagnosis not present

## 2024-10-28 DIAGNOSIS — E113293 Type 2 diabetes mellitus with mild nonproliferative diabetic retinopathy without macular edema, bilateral: Secondary | ICD-10-CM | POA: Diagnosis not present

## 2024-10-28 DIAGNOSIS — I10 Essential (primary) hypertension: Secondary | ICD-10-CM | POA: Diagnosis not present

## 2024-10-28 DIAGNOSIS — I480 Paroxysmal atrial fibrillation: Secondary | ICD-10-CM | POA: Diagnosis not present

## 2024-11-01 DIAGNOSIS — N390 Urinary tract infection, site not specified: Secondary | ICD-10-CM | POA: Diagnosis not present

## 2024-11-01 DIAGNOSIS — N8110 Cystocele, unspecified: Secondary | ICD-10-CM | POA: Diagnosis not present

## 2024-11-01 DIAGNOSIS — R350 Frequency of micturition: Secondary | ICD-10-CM | POA: Diagnosis not present

## 2024-11-01 DIAGNOSIS — R32 Unspecified urinary incontinence: Secondary | ICD-10-CM | POA: Diagnosis not present

## 2024-11-27 DIAGNOSIS — E113293 Type 2 diabetes mellitus with mild nonproliferative diabetic retinopathy without macular edema, bilateral: Secondary | ICD-10-CM | POA: Diagnosis not present

## 2024-11-27 DIAGNOSIS — I480 Paroxysmal atrial fibrillation: Secondary | ICD-10-CM | POA: Diagnosis not present

## 2024-11-27 DIAGNOSIS — I10 Essential (primary) hypertension: Secondary | ICD-10-CM | POA: Diagnosis not present

## 2024-11-27 DIAGNOSIS — E7800 Pure hypercholesterolemia, unspecified: Secondary | ICD-10-CM | POA: Diagnosis not present

## 2024-11-28 DIAGNOSIS — E113293 Type 2 diabetes mellitus with mild nonproliferative diabetic retinopathy without macular edema, bilateral: Secondary | ICD-10-CM | POA: Diagnosis not present

## 2024-11-28 DIAGNOSIS — Z79899 Other long term (current) drug therapy: Secondary | ICD-10-CM | POA: Diagnosis not present

## 2024-11-28 DIAGNOSIS — Z0001 Encounter for general adult medical examination with abnormal findings: Secondary | ICD-10-CM | POA: Diagnosis not present

## 2024-11-28 DIAGNOSIS — E7800 Pure hypercholesterolemia, unspecified: Secondary | ICD-10-CM | POA: Diagnosis not present

## 2024-11-28 DIAGNOSIS — I10 Essential (primary) hypertension: Secondary | ICD-10-CM | POA: Diagnosis not present

## 2024-11-28 DIAGNOSIS — I480 Paroxysmal atrial fibrillation: Secondary | ICD-10-CM | POA: Diagnosis not present

## 2025-01-27 ENCOUNTER — Ambulatory Visit (HOSPITAL_COMMUNITY): Admitting: Physician Assistant

## 2025-02-14 ENCOUNTER — Ambulatory Visit (HOSPITAL_COMMUNITY): Admitting: Physician Assistant
